# Patient Record
Sex: Male | Born: 1940 | Race: Black or African American | Hispanic: No | Smoking: Former smoker
Health system: Southern US, Community
[De-identification: ages and names within clinical notes are randomized; demographics above are authoritative.]

## PROBLEM LIST (undated history)

## (undated) DIAGNOSIS — C801 Malignant (primary) neoplasm, unspecified: Secondary | ICD-10-CM

## (undated) DIAGNOSIS — M47816 Spondylosis without myelopathy or radiculopathy, lumbar region: Secondary | ICD-10-CM

## (undated) DIAGNOSIS — F429 Obsessive-compulsive disorder, unspecified: Secondary | ICD-10-CM

## (undated) DIAGNOSIS — M47812 Spondylosis without myelopathy or radiculopathy, cervical region: Secondary | ICD-10-CM

## (undated) DIAGNOSIS — J309 Allergic rhinitis, unspecified: Secondary | ICD-10-CM

## (undated) DIAGNOSIS — F329 Major depressive disorder, single episode, unspecified: Secondary | ICD-10-CM

## (undated) DIAGNOSIS — M797 Fibromyalgia: Secondary | ICD-10-CM

## (undated) DIAGNOSIS — E785 Hyperlipidemia, unspecified: Secondary | ICD-10-CM

## (undated) DIAGNOSIS — S060X9A Concussion with loss of consciousness of unspecified duration, initial encounter: Secondary | ICD-10-CM

## (undated) DIAGNOSIS — J45909 Unspecified asthma, uncomplicated: Secondary | ICD-10-CM

## (undated) DIAGNOSIS — N6019 Diffuse cystic mastopathy of unspecified breast: Secondary | ICD-10-CM

## (undated) DIAGNOSIS — G894 Chronic pain syndrome: Secondary | ICD-10-CM

## (undated) DIAGNOSIS — G2581 Restless legs syndrome: Secondary | ICD-10-CM

## (undated) DIAGNOSIS — F17200 Nicotine dependence, unspecified, uncomplicated: Secondary | ICD-10-CM

## (undated) DIAGNOSIS — I10 Essential (primary) hypertension: Secondary | ICD-10-CM

## (undated) DIAGNOSIS — H409 Unspecified glaucoma: Secondary | ICD-10-CM

## (undated) HISTORY — PX: OTHER SURGICAL HISTORY: SHX169

## (undated) HISTORY — DX: Spondylosis without myelopathy or radiculopathy, lumbar region: M47.816

## (undated) HISTORY — DX: Concussion with loss of consciousness of unspecified duration, initial encounter: S06.0X9A

## (undated) HISTORY — DX: Restless legs syndrome: G25.81

## (undated) HISTORY — DX: Fibromyalgia: M79.7

## (undated) HISTORY — PX: EYE SURGERY: SHX253

## (undated) HISTORY — DX: Hyperlipidemia, unspecified: E78.5

## (undated) HISTORY — DX: Diffuse cystic mastopathy of unspecified breast: N60.19

## (undated) HISTORY — DX: Obsessive-compulsive disorder, unspecified: F42.9

## (undated) HISTORY — DX: Nicotine dependence, unspecified, uncomplicated: F17.200

## (undated) HISTORY — DX: Chronic pain syndrome: G89.4

## (undated) HISTORY — DX: Spondylosis without myelopathy or radiculopathy, cervical region: M47.812

## (undated) HISTORY — PX: BILATERAL SALPINGOOPHORECTOMY: SHX1223

## (undated) HISTORY — DX: Essential (primary) hypertension: I10

## (undated) HISTORY — DX: Malignant (primary) neoplasm, unspecified: C80.1

## (undated) HISTORY — PX: BIOPSY THYROID: PRO38

## (undated) HISTORY — PX: COLON SURGERY: SHX602

## (undated) HISTORY — DX: Allergic rhinitis, unspecified: J30.9

## (undated) HISTORY — DX: Major depressive disorder, single episode, unspecified: F32.9

## (undated) SURGERY — Surgical Case
Anesthesia: *Unknown

---

## 2010-01-27 DIAGNOSIS — F17200 Nicotine dependence, unspecified, uncomplicated: Secondary | ICD-10-CM

## 2010-01-27 HISTORY — DX: Nicotine dependence, unspecified, uncomplicated: F17.200

## 2012-03-26 ENCOUNTER — Encounter (INDEPENDENT_AMBULATORY_CARE_PROVIDER_SITE_OTHER): Payer: Self-pay | Admitting: General Surgery

## 2012-04-01 ENCOUNTER — Encounter (INDEPENDENT_AMBULATORY_CARE_PROVIDER_SITE_OTHER): Payer: Self-pay | Admitting: General Surgery

## 2012-04-01 ENCOUNTER — Ambulatory Visit (INDEPENDENT_AMBULATORY_CARE_PROVIDER_SITE_OTHER): Payer: No Typology Code available for payment source | Admitting: General Surgery

## 2012-04-01 VITALS — BP 110/70 | HR 85 | Temp 97.6°F | Resp 18 | Ht 69.0 in | Wt 171.4 lb

## 2012-04-01 DIAGNOSIS — K4090 Unilateral inguinal hernia, without obstruction or gangrene, not specified as recurrent: Secondary | ICD-10-CM | POA: Insufficient documentation

## 2012-04-01 DIAGNOSIS — J45909 Unspecified asthma, uncomplicated: Secondary | ICD-10-CM | POA: Insufficient documentation

## 2012-04-01 DIAGNOSIS — I10 Essential (primary) hypertension: Secondary | ICD-10-CM | POA: Insufficient documentation

## 2012-04-01 DIAGNOSIS — E785 Hyperlipidemia, unspecified: Secondary | ICD-10-CM | POA: Insufficient documentation

## 2012-04-01 NOTE — Progress Notes (Signed)
Patient ID: Gregory Bautista, male   DOB: [birth year], 72 y.o.   MRN: [id number]  Chief Complaint  Patient presents with  . New Evaluation    eval hernia    HPI Gregory Bautista is a 72 y.o. male.  He is referred by Dr. Fleet Contras for evaluation of a right inguinal hernia.  The patient states that he has noticed a bulge in his right groin for about 6 weeks. He has a little bit of pain. It has been reducible. He has no prior history of hernia. He would like to have this repaired at some point  Comorbidities included asthma, well controlled, hypertension, hyperlipidemia. No cardiac or neurological problems. He's had 2 laparotomies in [place]. 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 He worked at a [employer]. Asymptomatic for years. No GI problems now.  HPI  Past Medical History  Diagnosis Date  . Depression   . Hypertension   . Hyperlipidemia   . OCD (obsessive compulsive disorder)   . Fibromyalgia   . Concussion   . Chronic pain syndrome   . Cervical spondylosis   . DJD (degenerative joint disease) of lumbar spine   . Restless leg syndrome 10/24/09  . Tobacco use disorder 01/27/10  . Cancer     breast  . Fibrocystic breast   . Allergic rhinitis     NEC    Past Surgical History  Procedure Laterality Date  . Abdominal hysterectomy    . Left shoulder surgery    . Bilateral salpingoophorectomy    . Biopsy thyroid      History reviewed. No pertinent family history.  Social History History  Substance Use Topics  . Smoking status: Current Every Day Smoker -- 0.50 packs/day    Types: Cigarettes  . Smokeless tobacco: Never Used  . Alcohol Use: No    Allergies  Allergen Reactions  . Penicillins Anaphylaxis  . Wellbutrin (Bupropion) Other (See Comments)    Seizures  . Morphine And Related Itching    Current Outpatient Prescriptions  Medication Sig Dispense Refill  . albuterol (PROVENTIL HFA;VENTOLIN HFA) 108 (90 BASE) MCG/ACT inhaler Inhale 2 puffs into  the lungs every 6 (six) hours as needed for wheezing.      . Armodafinil (NUVIGIL) 50 MG tablet Take 100 mg by mouth daily.      [name] atomoxetine (STRATTERA) 18 MG capsule Take 18 mg by mouth daily.      [name] atorvastatin (LIPITOR) 40 MG tablet Take 40 mg by mouth at bedtime.      . carisoprodol (SOMA) 350 MG tablet Take 350 mg by mouth 3 (three) times daily as needed for muscle spasms.      . clonazePAM (KLONOPIN) 1 MG tablet Take 0.5 mg by mouth 2 (two) times daily as needed for anxiety.      [name] FLUoxetine (PROZAC) 40 MG capsule Take 80 mg by mouth daily.      . fluticasone (FLONASE) 50 MCG/ACT nasal spray Place 2 sprays into the nose daily.      [name] lidocaine (LIDODERM) 5 % Place 1 patch onto the skin every 12 (twelve) hours as needed. Remove & Discard patch within 12 hours or as directed by MD      . lisinopril (PRINIVIL,ZESTRIL) 10 MG tablet Take 10 mg by mouth daily.      [name] loratadine (CLARITIN) 10 MG tablet Take 10 mg by mouth daily.      . meloxicam (MOBIC) 15 MG tablet Take 15 mg by mouth  daily.      . pramipexole (MIRAPEX) 0.25 MG tablet Take 0.25 mg by mouth at bedtime.       No current facility-administered medications for this visit.    Review of Systems Review of Systems  Constitutional: Negative for fever, chills and unexpected weight change.  HENT: Negative for hearing loss, congestion, sore throat, trouble swallowing and voice change.   Eyes: Positive for visual disturbance.  Respiratory: Negative for cough and wheezing.   Cardiovascular: Negative for chest pain, palpitations and leg swelling.  Gastrointestinal: Negative for nausea, vomiting, abdominal pain, diarrhea, constipation, blood in stool, abdominal distention, anal bleeding and rectal pain.  Genitourinary: Negative for hematuria and difficulty urinating.  Musculoskeletal: Negative for arthralgias.  Skin: Negative for rash and wound.  Neurological: Negative for seizures, syncope, weakness and headaches.  Hematological:  Negative for adenopathy. Does not bruise/bleed easily.  Psychiatric/Behavioral: Negative for confusion.    Blood pressure 110/70, pulse 85, temperature 97.6 F (36.4 C), temperature source Temporal, resp. rate 18, height 5\' 9"  (1.753 m), weight 171 lb 6.4 oz (77.747 kg).  Physical Exam Physical Exam  Constitutional: He is oriented to person, place, and time. He appears well-developed and well-nourished. No distress.  HENT:  Head: Normocephalic.  Nose: Nose normal.  Mouth/Throat: No oropharyngeal exudate.  Eyes: Conjunctivae and EOM are normal. Pupils are equal, round, and reactive to light. Right eye exhibits no discharge. Left eye exhibits no discharge. No scleral icterus.  Neck: Normal range of motion. Neck supple. No JVD present. No tracheal deviation present. No thyromegaly present.  Cardiovascular: Normal rate, regular rhythm, normal heart sounds and intact distal pulses.   No murmur heard. Pulmonary/Chest: Effort normal and breath sounds normal. No stridor. No respiratory distress. He has no wheezes. He has no rales. He exhibits no tenderness.  Abdominal: Soft. Bowel sounds are normal. He exhibits no distension and no mass. There is no tenderness. There is no rebound and no guarding.    Midline incision and chevron incision appeared to be healed. No organomegaly. No tenderness. No obvious incisional hernias.  Genitourinary:  Medium-sized right inguinal hernia is obvious. Completely reducible. No evidence of left inguinal hernia. Penis scrotum and testes are normal.  Musculoskeletal: Normal range of motion. He exhibits no edema and no tenderness.  Lymphadenopathy:    He has no cervical adenopathy.  Neurological: He is alert and oriented to person, place, and time. He has normal reflexes. Coordination normal.  Skin: Skin is warm and dry. No rash noted. He is not diaphoretic. No erythema. No pallor.  2 cm flat lipoma of forehead. 1.5 cm sebaceous cyst left cheek.  Psychiatric: He  has a normal mood and affect. His behavior is normal. Judgment and thought content normal.    Data Reviewed None available  Assessment    Right inguinal hernia, becoming symptomatic  Chronic asthma, controlled  Hypertension  Hyperlipidemia  History 2 laparotomies for some type of intestinal parasite in [place]. Now asymptomatic     Plan    At the patient's request, he will be scheduled for open repair of his right inguinal hernia. He is not a candidate for laparoscopic repair because of his multiple abdominal  scars  I have discussed indications, details, techniques, and numerous risk of the surgery with him. I reviewed patient information booklets and diagrams and given that to him. All of his questions are answered. He understands these issues. He agrees with this plan.   Discontinue Mobic and aspirin 5 days preoperatively  Angelia Mould. Derrell Lolling, M.D., [facility] General and Minimally invasive Surgery Breast and Colorectal Surgery Office:   [phone] Pager:   [phone]  04/01/2012, 9:55 AM

## 2012-04-01 NOTE — Patient Instructions (Signed)
You have a medium-sized, a reducible right inguinal hernia.  You will be scheduled for surgery to repair a right inguinal hernia with mesh in the near future.     Inguinal Hernia, Adult Muscles help keep everything in the body in its proper place. But if a weak spot in the muscles develops, something can poke through. That is called a hernia. When this happens in the lower part of the belly (abdomen), it is called an inguinal hernia. (It takes its name from a part of the body in this region called the inguinal canal.) A weak spot in the wall of muscles lets some fat or part of the small intestine bulge through. An inguinal hernia can develop at any age. Men get them more often than women. CAUSES  In adults, an inguinal hernia develops over time.  It can be triggered by:  Suddenly straining the muscles of the lower abdomen.  Lifting heavy objects.  Straining to have a bowel movement. Difficult bowel movements (constipation) can lead to this.  Constant coughing. This may be caused by smoking or lung disease.  Being overweight.  Being pregnant.  Working at a job that requires long periods of standing or heavy lifting.  Having had an inguinal hernia before. One type can be an emergency situation. It is called a strangulated inguinal hernia. It develops if part of the small intestine slips through the weak spot and cannot get back into the abdomen. The blood supply can be cut off. If that happens, part of the intestine may die. This situation requires emergency surgery. SYMPTOMS  Often, a small inguinal hernia has no symptoms. It is found when a healthcare Angelize Ryce does a physical exam. Larger hernias usually have symptoms.   In adults, symptoms may include:  A lump in the groin. This is easier to see when the person is standing. It might disappear when lying down.  In men, a lump in the scrotum.  Pain or burning in the groin. This occurs especially when lifting, straining or  coughing.  A dull ache or feeling of pressure in the groin.  Signs of a strangulated hernia can include:  A bulge in the groin that becomes very painful and tender to the touch.  A bulge that turns red or purple.  Fever, nausea and vomiting.  Inability to have a bowel movement or to pass gas. DIAGNOSIS  To decide if you have an inguinal hernia, a healthcare Alena Blankenbeckler will probably do a physical examination.  This will include asking questions about any symptoms you have noticed.  The healthcare Aniyiah Zell might feel the groin area and ask you to cough. If an inguinal hernia is felt, the healthcare Zacharia Sowles may try to slide it back into the abdomen.  Usually no other tests are needed. TREATMENT  Treatments can vary. The size of the hernia makes a difference. Options include:  Watchful waiting. This is often suggested if the hernia is small and you have had no symptoms.  No medical procedure will be done unless symptoms develop.  You will need to watch closely for symptoms. If any occur, contact your healthcare Chane Magner right away.  Surgery. This is used if the hernia is larger or you have symptoms.  Open surgery. This is usually an outpatient procedure (you will not stay overnight in a hospital). An cut (incision) is made through the skin in the groin. The hernia is put back inside the abdomen. The weak area in the muscles is then repaired by herniorrhaphy or  hernioplasty. Herniorrhaphy: in this type of surgery, the weak muscles are sewn back together. Hernioplasty: a patch or mesh is used to close the weak area in the abdominal wall.  Laparoscopy. In this procedure, a surgeon makes small incisions. A thin tube with a tiny video camera (called a laparoscope) is put into the abdomen. The surgeon repairs the hernia with mesh by looking with the video camera and using two long instruments. HOME CARE INSTRUCTIONS   After surgery to repair an inguinal hernia:  You will need to take pain  medicine prescribed by your healthcare Stephen Baruch. Follow all directions carefully.  You will need to take care of the wound from the incision.  Your activity will be restricted for awhile. This will probably include no heavy lifting for several weeks. You also should not do anything too active for a few weeks. When you can return to work will depend on the type of job that you have.  During "watchful waiting" periods, you should:  Maintain a healthy weight.  Eat a diet high in fiber (fruits, vegetables and whole grains).  Drink plenty of fluids to avoid constipation. This means drinking enough water and other liquids to keep your urine clear or pale yellow.  Do not lift heavy objects.  Do not stand for long periods of time.  Quit smoking. This should keep you from developing a frequent cough. SEEK MEDICAL CARE IF:   A bulge develops in your groin area.  You feel pain, a burning sensation or pressure in the groin. This might be worse if you are lifting or straining.  You develop a fever of more than 100.5 F (38.1 C). SEEK IMMEDIATE MEDICAL CARE IF:   Pain in the groin increases suddenly.  A bulge in the groin gets bigger suddenly and does not go down.  For men, there is sudden pain in the scrotum. Or, the size of the scrotum increases.  A bulge in the groin area becomes red or purple and is painful to touch.  You have nausea or vomiting that does not go away.  You feel your heart beating much faster than normal.  You cannot have a bowel movement or pass gas.  You develop a fever of more than 102.0 F (38.9 C). Document Released: 05/27/2008 Document Revised: 04/02/2011 Document Reviewed: 05/27/2008 [facility] Patient Information 2013 [place], [place].

## 2012-04-02 ENCOUNTER — Telehealth (INDEPENDENT_AMBULATORY_CARE_PROVIDER_SITE_OTHER): Payer: Self-pay

## 2012-04-02 ENCOUNTER — Ambulatory Visit
Admission: RE | Admit: 2012-04-02 | Discharge: 2012-04-02 | Disposition: A | Discharge status: Home / Self Care | Payer: Medicare Other | Source: Ambulatory Visit | Attending: General Surgery | Admitting: General Surgery

## 2012-04-02 ENCOUNTER — Other Ambulatory Visit (INDEPENDENT_AMBULATORY_CARE_PROVIDER_SITE_OTHER): Payer: Self-pay | Admitting: General Surgery

## 2012-04-02 DIAGNOSIS — Z01811 Encounter for preprocedural respiratory examination: Secondary | ICD-10-CM

## 2012-04-02 IMAGING — CR DG CHEST 2V
2 series · 2 of 2 positions shown · non-contrast
Comparison: None.

CLINICAL DATA: Cough.  Smoking history.  Preoperative respiratory
exam for hernia surgery.

CHEST - 2 VIEW

[w chest pa]
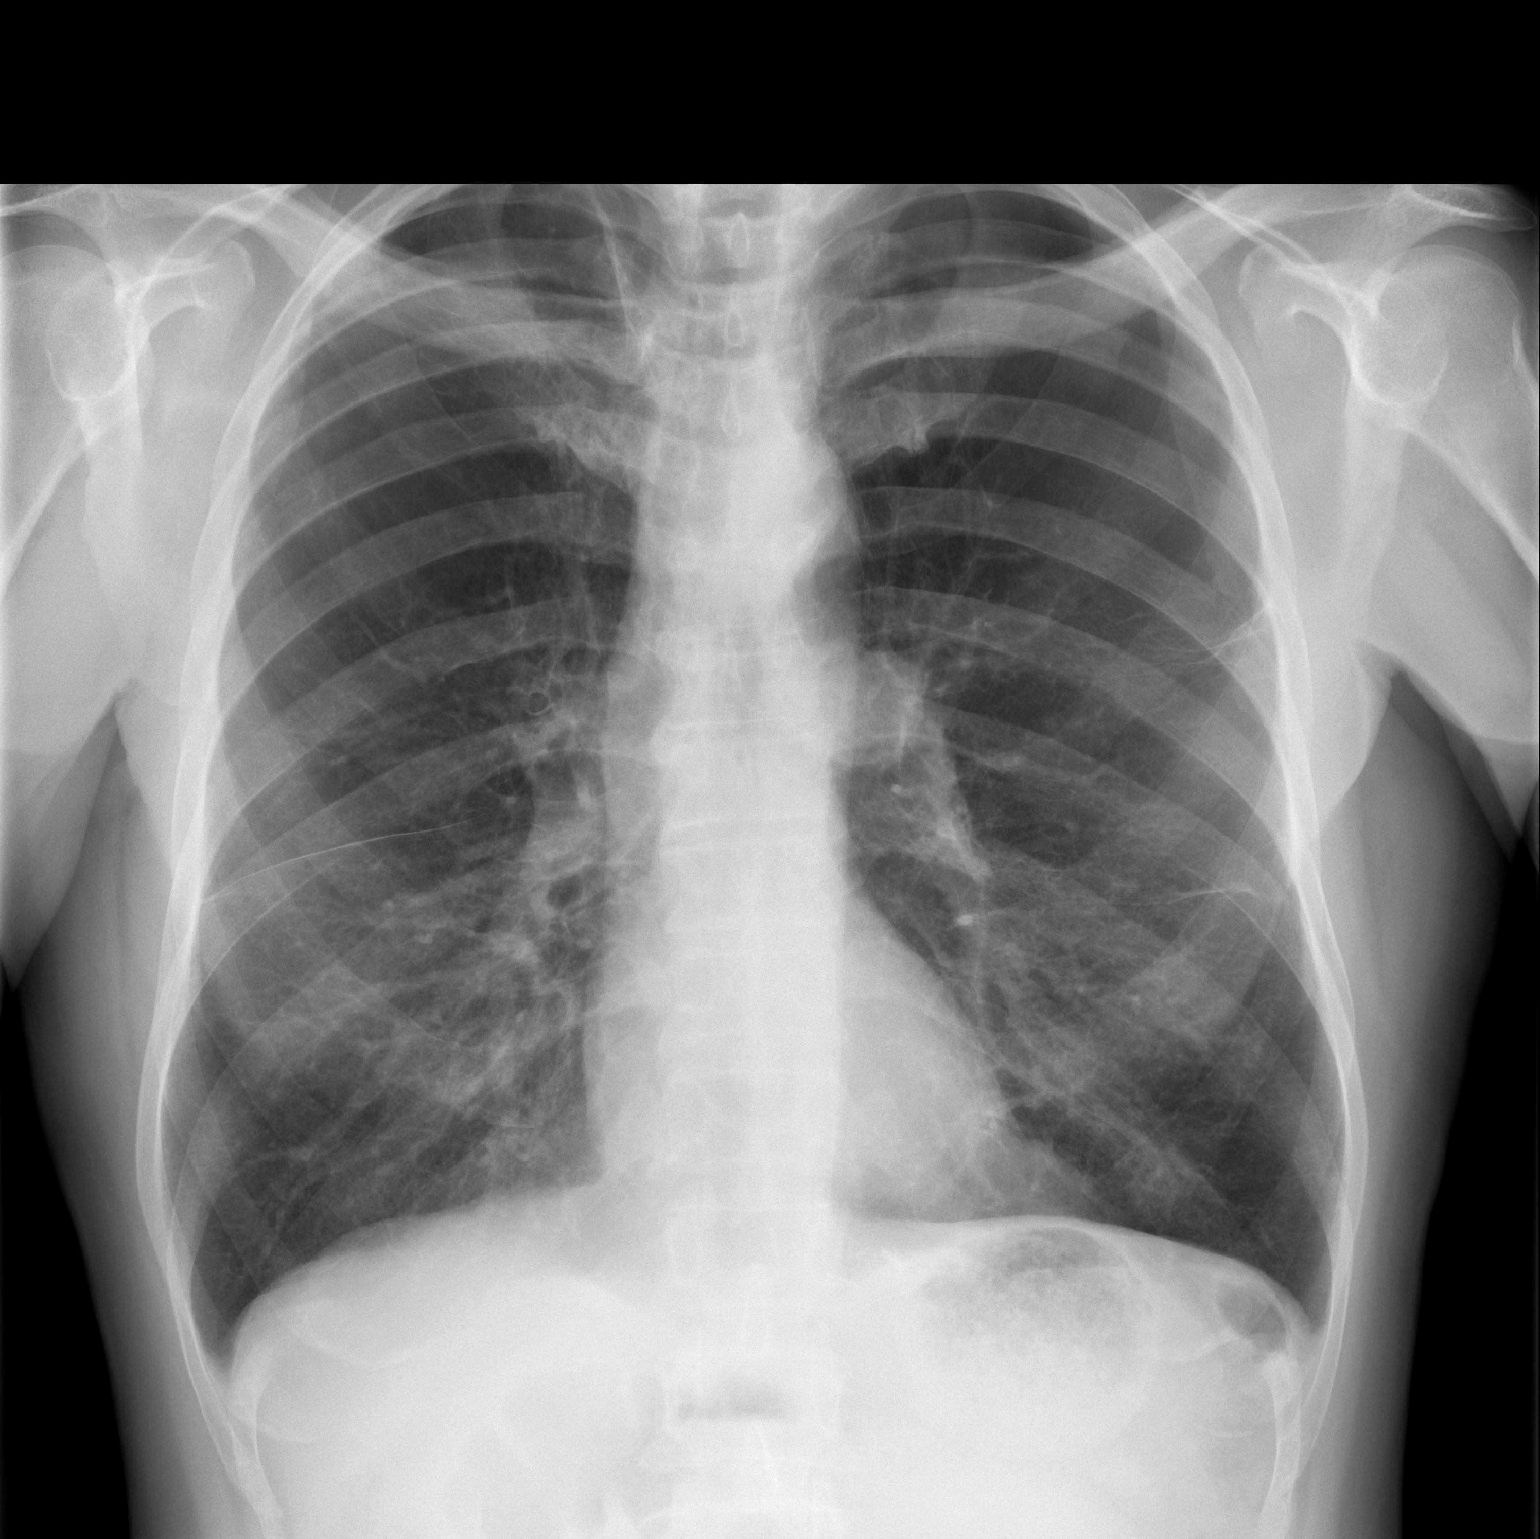

[w chest lat]
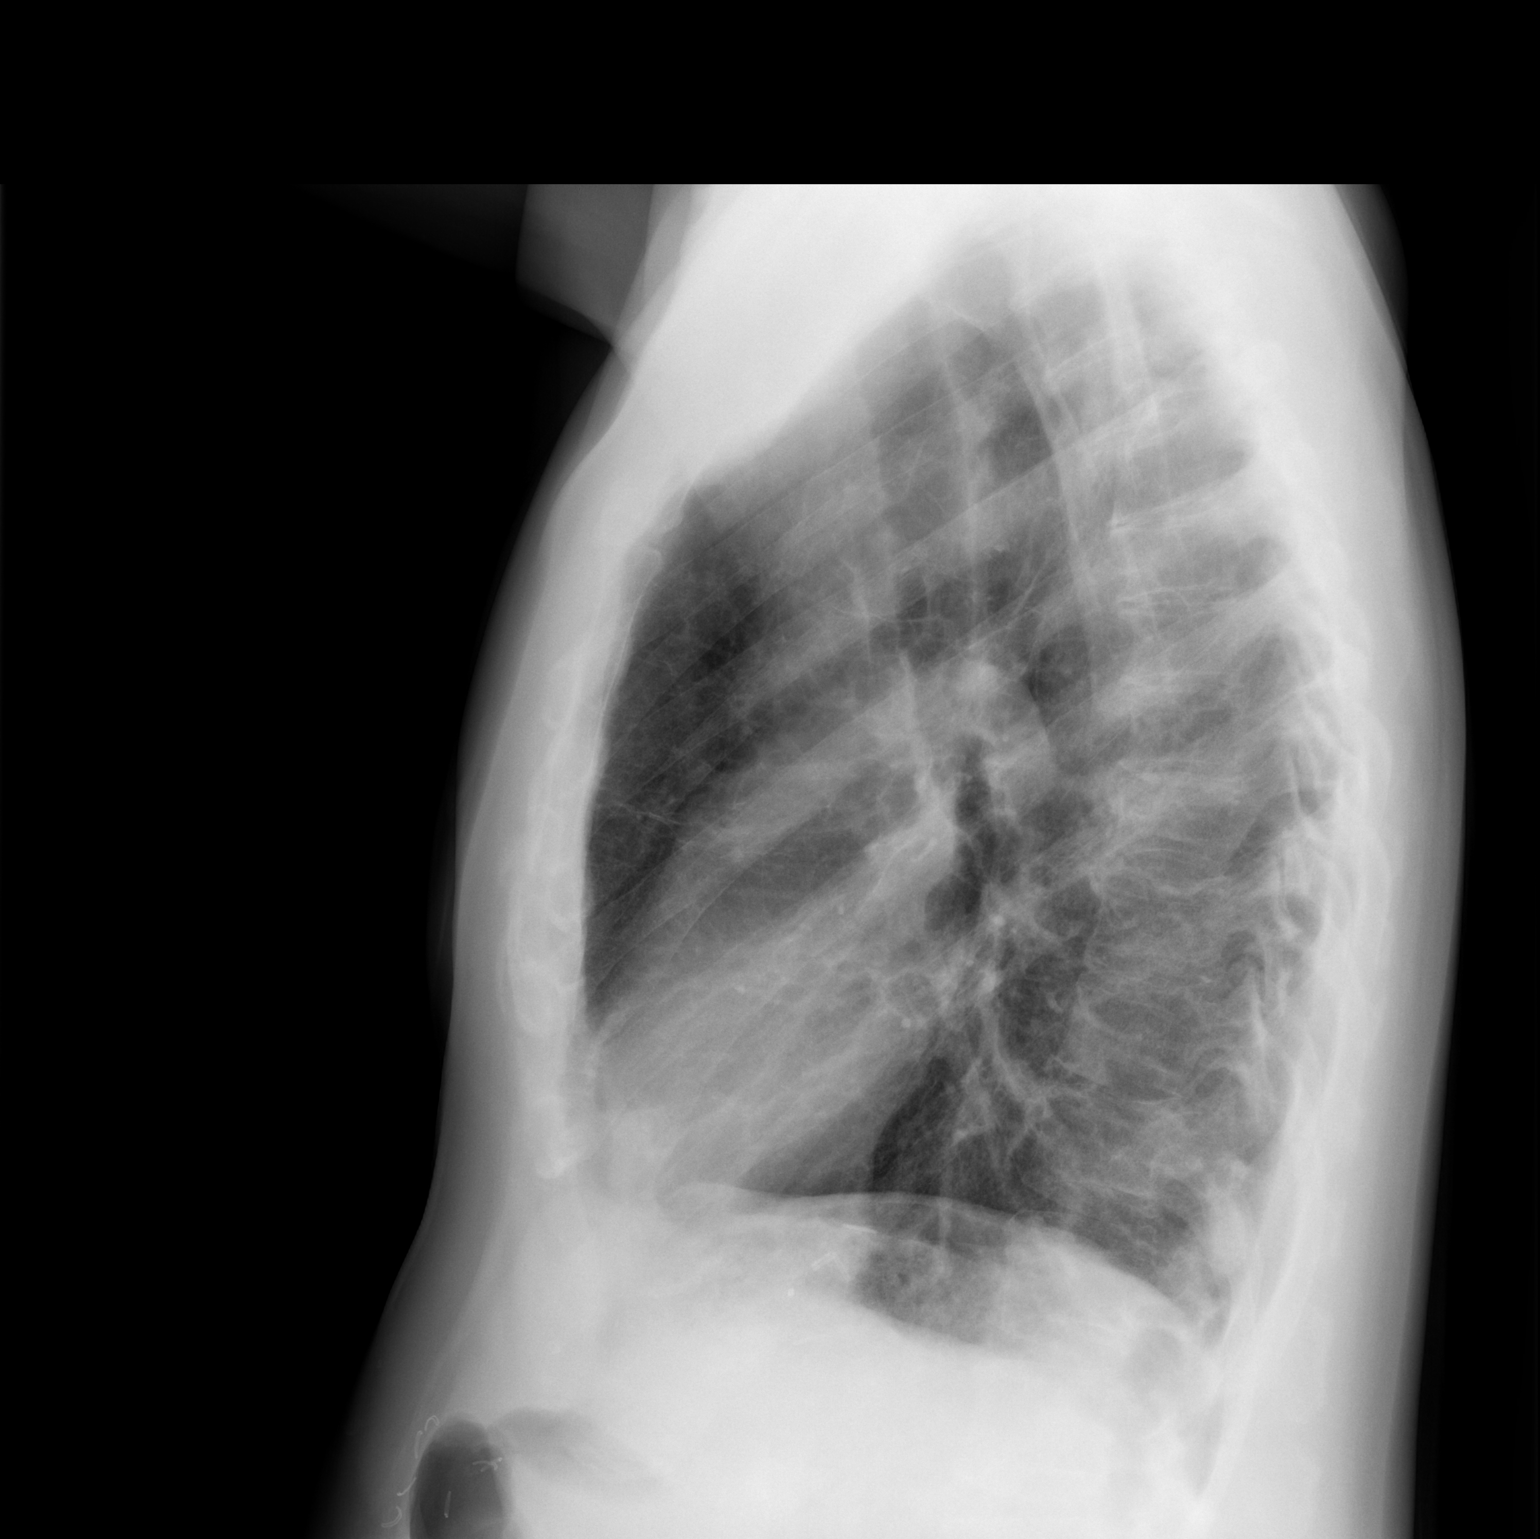

[2 of 2 positions shown; findings below may reference images not displayed]

FINDINGS: There is severe emphysema, upper lobe predominant.
Increased interstitial markings and focal scars are present in the
mid and lower lungs bilaterally.  No evidence of mass lesion,
infiltrate, collapse or effusion.  No acute bony finding.
IMPRESSION: Severe emphysema and pulmonary scarring.  No acute or focal
process.

## 2012-04-02 NOTE — Telephone Encounter (Signed)
Message copied by Ivory Broad on Wed Apr 02, 2012  2:23 PM ------      Message from: Joanette Gula      Created: Tue Apr 01, 2012 10:37 AM      Regarding: FW: Dr Marnette Burgess: [phone]                   ----- Message -----         From: Leanne Chang         Sent: 04/01/2012  10:18 AM           To: Joanette Gula, LPN      Subject: Dr Derrell Lolling                                                Patient need 1st po for sx on 04/04/12. ------

## 2012-04-02 NOTE — Telephone Encounter (Signed)
I called and gave the pt his postoperative appt

## 2012-04-04 DIAGNOSIS — K4090 Unilateral inguinal hernia, without obstruction or gangrene, not specified as recurrent: Secondary | ICD-10-CM

## 2012-04-23 ENCOUNTER — Ambulatory Visit (INDEPENDENT_AMBULATORY_CARE_PROVIDER_SITE_OTHER): Payer: No Typology Code available for payment source | Admitting: General Surgery

## 2012-04-23 ENCOUNTER — Encounter (INDEPENDENT_AMBULATORY_CARE_PROVIDER_SITE_OTHER): Payer: Self-pay | Admitting: General Surgery

## 2012-04-23 VITALS — BP 108/62 | HR 78 | Temp 98.0°F | Resp 20 | Ht 69.0 in | Wt 171.0 lb

## 2012-04-23 DIAGNOSIS — K4090 Unilateral inguinal hernia, without obstruction or gangrene, not specified as recurrent: Secondary | ICD-10-CM

## 2012-04-23 NOTE — Patient Instructions (Signed)
You are recovering from your right inguinal hernia surgery without any obvious surgical complications.  You may resume all normal physical activities without any restriction after April 15. Be sure to stretch daily. Take a long walk everyday  Return to see Dr. Derrell Lolling if further problems arise.

## 2012-04-23 NOTE — Progress Notes (Signed)
Patient ID: Gregory Bautista, male   DOB: 1940/02/22, 72 y.o.   MRN: [id number] History: This patient underwent an open repair of right inguinal hernia with mesh on 04/04/2012. He is doing very well. He says he has minimal pain. No abdominal problems. No wound problems.  Exam: Patient looks well. Friendly. In no distress. Right inguinal incision is healing normally. Minimal swelling. Hernia repair intact. No infection. Penis scrotum and testes normal.  Assessment: Right inguinal hernia, recovering uneventfully following open repair with mesh  Plan: Resume normal physical activities without restriction after April 15. Return to see me if further problems arise.    Angelia Mould. Derrell Lolling, M.D., [facility] General and Minimally invasive Surgery Breast and Colorectal Surgery Office:   [phone] Pager:   [phone]

## 2013-09-07 ENCOUNTER — Other Ambulatory Visit (HOSPITAL_COMMUNITY): Payer: Self-pay | Admitting: Internal Medicine

## 2013-09-07 DIAGNOSIS — M25559 Pain in unspecified hip: Secondary | ICD-10-CM

## 2013-09-08 ENCOUNTER — Ambulatory Visit (HOSPITAL_COMMUNITY): Payer: Medicare HMO

## 2013-09-09 ENCOUNTER — Ambulatory Visit (HOSPITAL_COMMUNITY): Payer: Medicare HMO

## 2015-03-24 ENCOUNTER — Emergency Department (HOSPITAL_COMMUNITY)
Admission: EM | Admit: 2015-03-24 | Discharge: 2015-03-24 | Disposition: A | Discharge status: Home / Self Care | Payer: Commercial Managed Care - HMO | Attending: Emergency Medicine | Admitting: Emergency Medicine

## 2015-03-24 ENCOUNTER — Emergency Department (HOSPITAL_COMMUNITY): Payer: Commercial Managed Care - HMO

## 2015-03-24 ENCOUNTER — Encounter (HOSPITAL_COMMUNITY): Payer: Self-pay | Admitting: Emergency Medicine

## 2015-03-24 DIAGNOSIS — Z853 Personal history of malignant neoplasm of breast: Secondary | ICD-10-CM | POA: Diagnosis not present

## 2015-03-24 DIAGNOSIS — G894 Chronic pain syndrome: Secondary | ICD-10-CM | POA: Diagnosis not present

## 2015-03-24 DIAGNOSIS — Z880 Allergy status to penicillin: Secondary | ICD-10-CM | POA: Insufficient documentation

## 2015-03-24 DIAGNOSIS — R0602 Shortness of breath: Secondary | ICD-10-CM | POA: Diagnosis present

## 2015-03-24 DIAGNOSIS — J441 Chronic obstructive pulmonary disease with (acute) exacerbation: Secondary | ICD-10-CM

## 2015-03-24 DIAGNOSIS — Z79899 Other long term (current) drug therapy: Secondary | ICD-10-CM | POA: Insufficient documentation

## 2015-03-24 DIAGNOSIS — I10 Essential (primary) hypertension: Secondary | ICD-10-CM | POA: Insufficient documentation

## 2015-03-24 DIAGNOSIS — Z8659 Personal history of other mental and behavioral disorders: Secondary | ICD-10-CM | POA: Insufficient documentation

## 2015-03-24 DIAGNOSIS — Z87891 Personal history of nicotine dependence: Secondary | ICD-10-CM | POA: Diagnosis not present

## 2015-03-24 LAB — CBC
HCT: 44.1 % (ref 39.0–52.0)
Hemoglobin: 14.3 g/dL (ref 13.0–17.0)
MCH: 29.2 pg (ref 26.0–34.0)
MCHC: 32.4 g/dL (ref 30.0–36.0)
MCV: 90 fL (ref 78.0–100.0)
Platelets: 195 10*3/uL (ref 150–400)
RBC: 4.9 MIL/uL (ref 4.22–5.81)
RDW: 12.8 % (ref 11.5–15.5)
WBC: 5.4 10*3/uL (ref 4.0–10.5)

## 2015-03-24 LAB — BASIC METABOLIC PANEL
Anion gap: 15 (ref 5–15)
BUN: 6 mg/dL (ref 6–20)
CO2: 26 mmol/L (ref 22–32)
Calcium: 9.3 mg/dL (ref 8.9–10.3)
Chloride: 103 mmol/L (ref 101–111)
Creatinine, Ser: 0.85 mg/dL (ref 0.61–1.24)
GFR calc Af Amer: >60 mL/min (ref >60)
GFR calc non Af Amer: >60 mL/min (ref >60)
Glucose, Bld: 111 mg/dL — ABNORMAL HIGH (ref 65–99)
Potassium: 3.6 mmol/L (ref 3.5–5.1)
Sodium: 144 mmol/L (ref 135–145)

## 2015-03-24 LAB — I-STAT TROPONIN, ED: Troponin i, poc: 0.01 ng/mL (ref 0.00–0.08)

## 2015-03-24 IMAGING — DX DG CHEST 2V
2 series · 2 of 2 positions shown · non-contrast
Comparison: [DATE]

CLINICAL DATA: Shortness of breath with cough and chest tightness

EXAM:
CHEST  2 VIEW

[w chest pa]
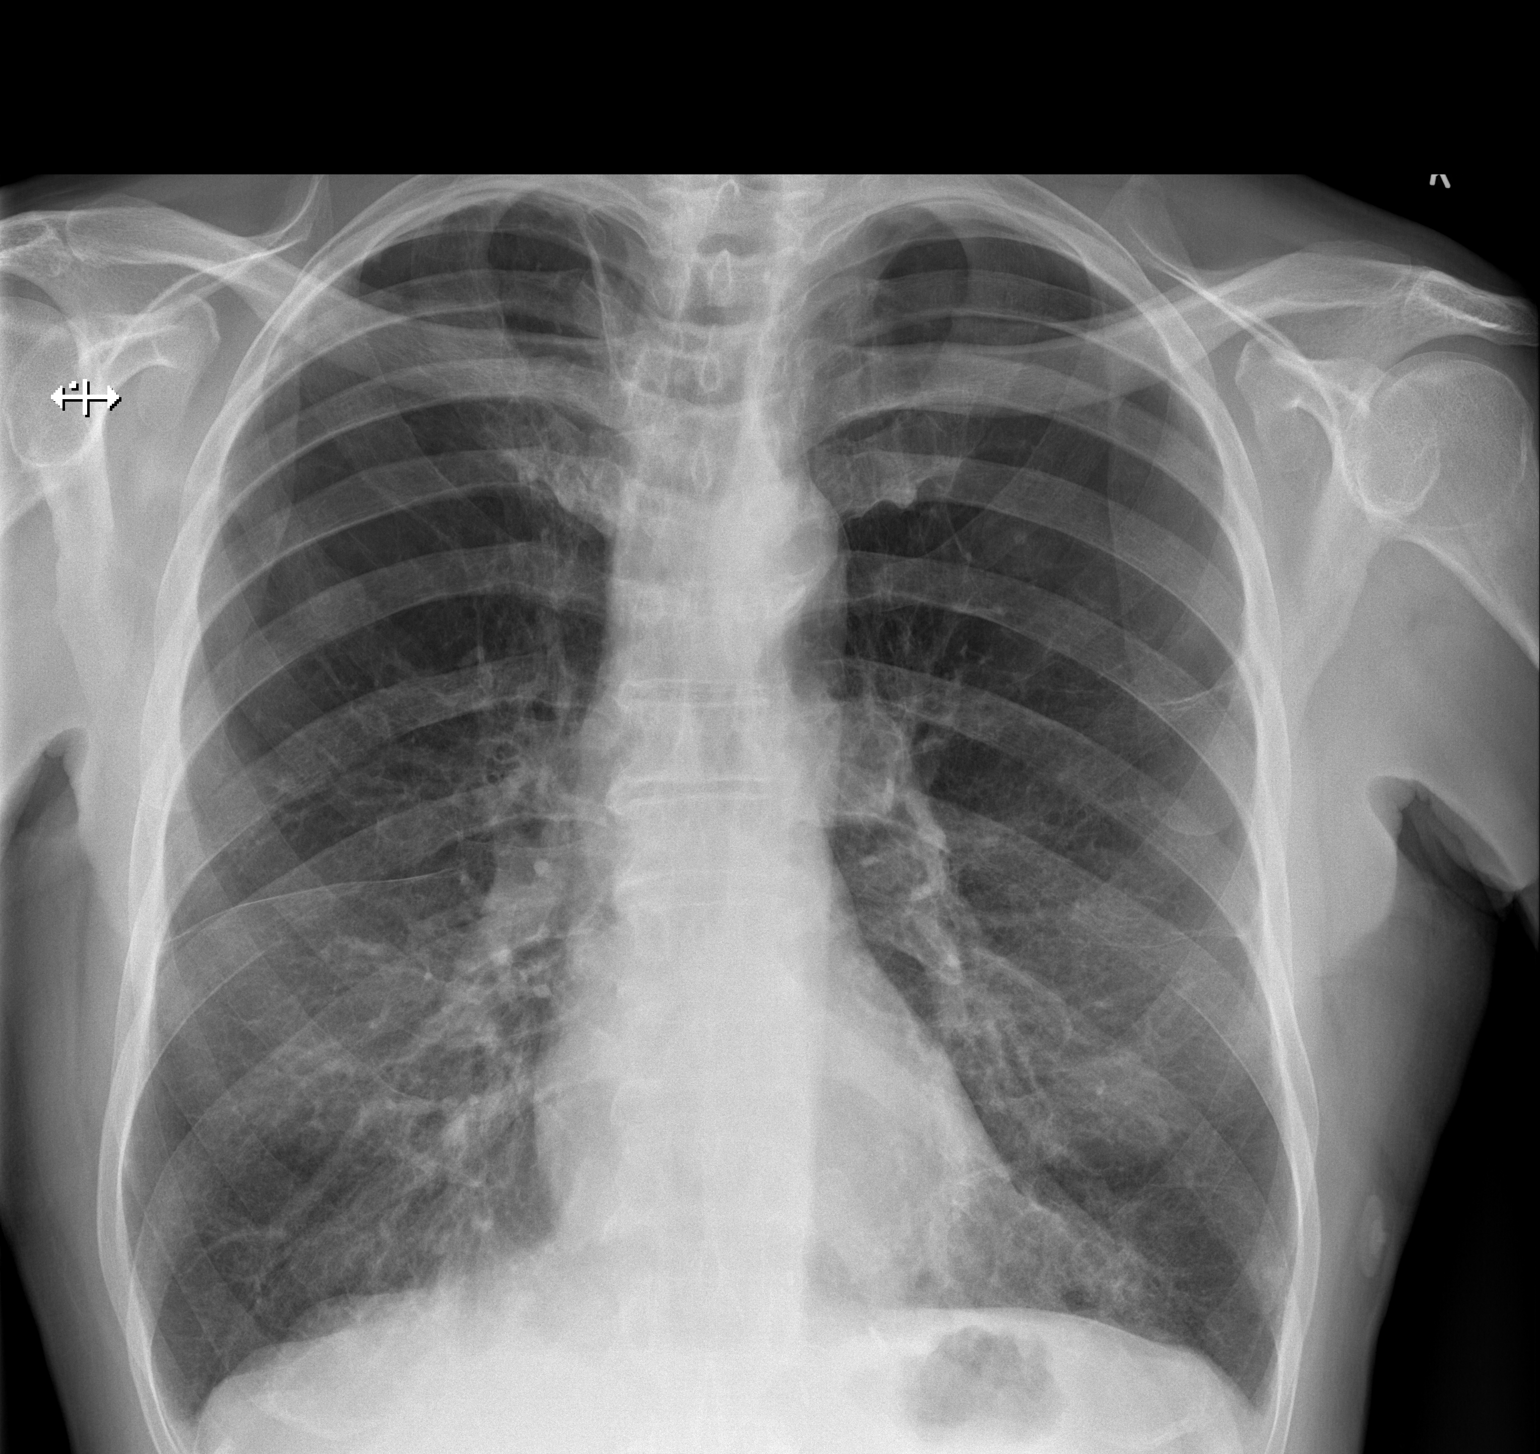

[w chest lat]
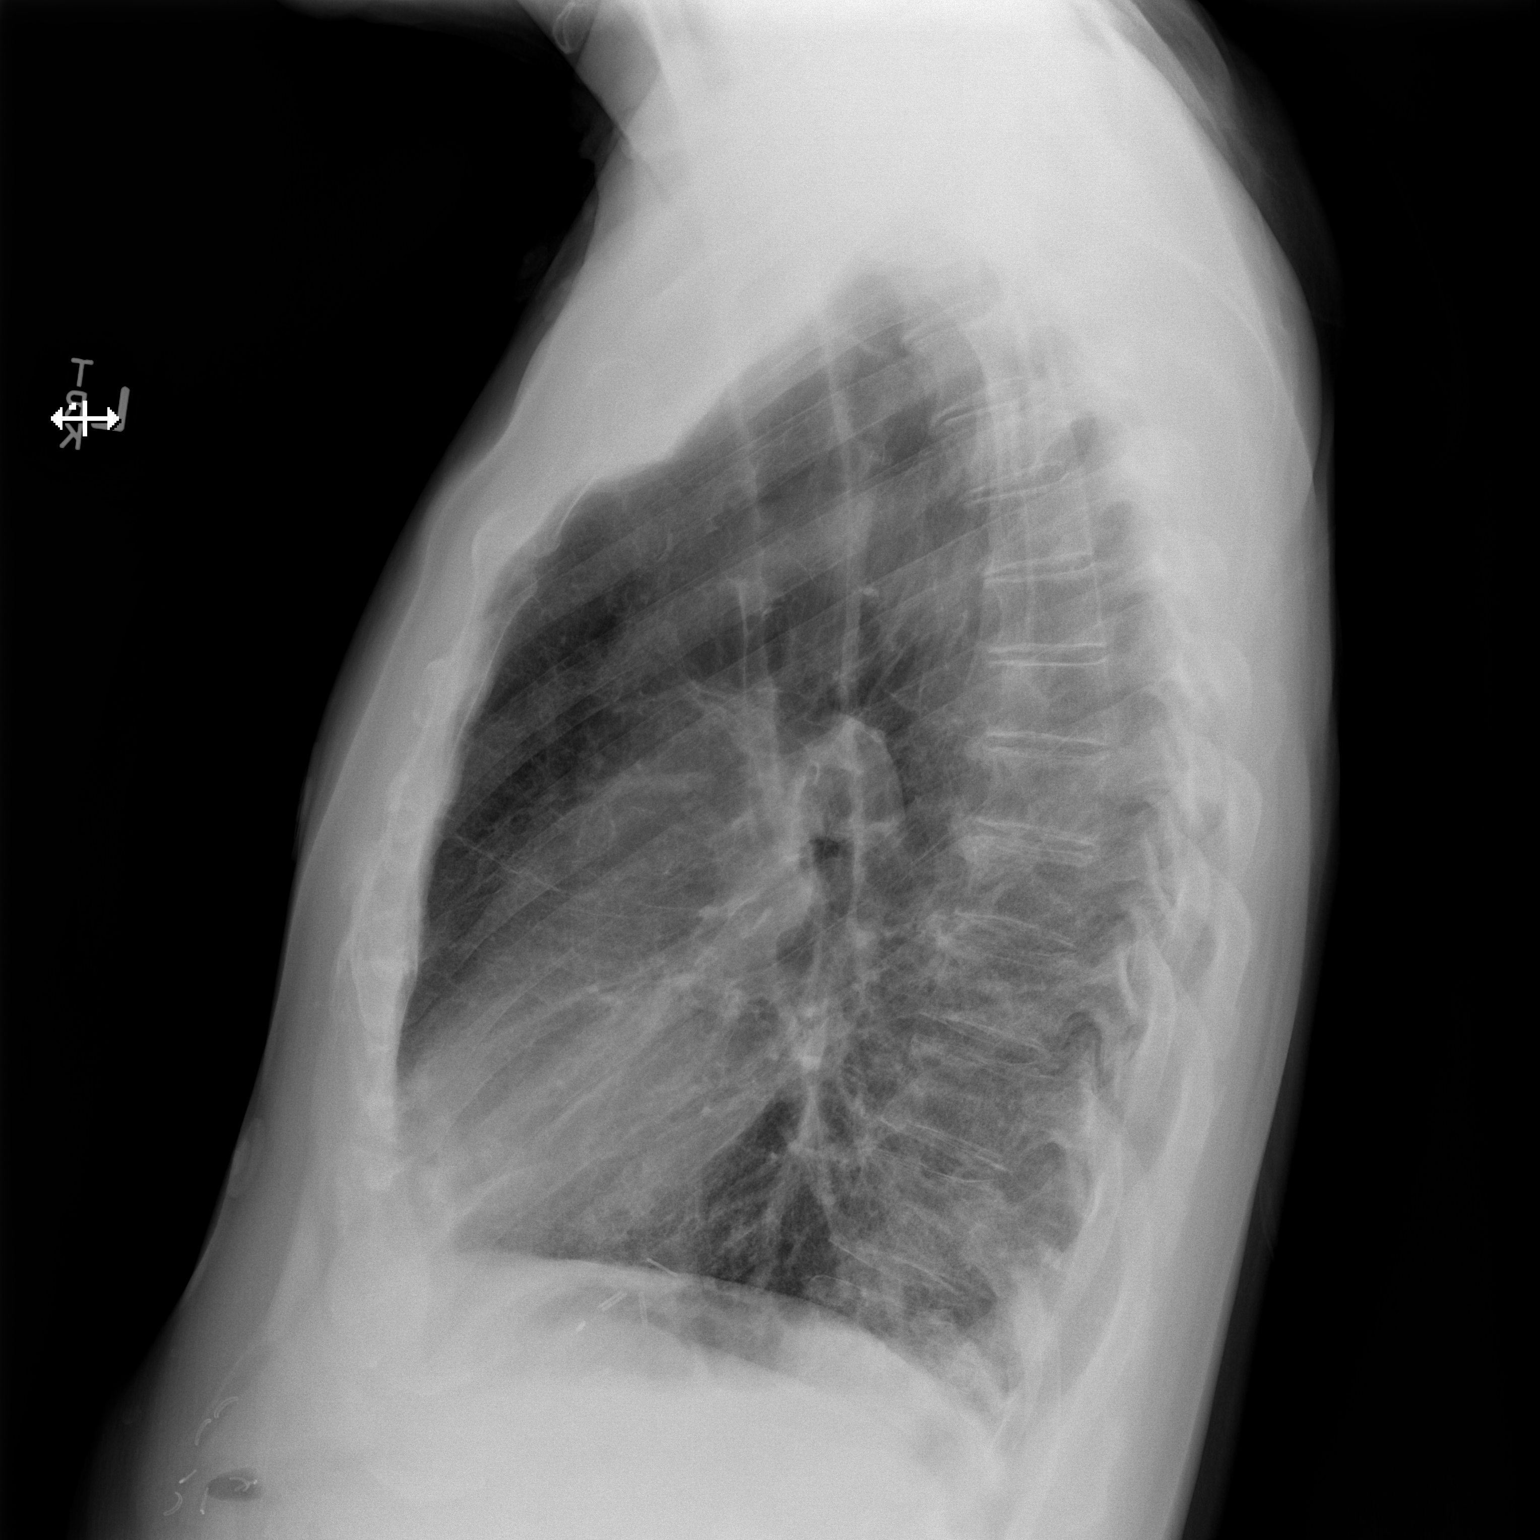

[2 of 2 positions shown; findings below may reference images not displayed]

FINDINGS: There is extensive bullous disease in both upper lobes. Lungs are
hyperexpanded. There is scarring in the medial right base, stable.

There is no edema or consolidation. Areas of mild interstitial
prominence largely reflect redistribution of blood flow to viable
lung segments. The heart size is normal. The pulmonary vascularity
reflects the underlying bullous disease. No adenopathy. No bone
lesions.
IMPRESSION: Widespread bullous emphysematous change. Scarring right upper lobe.
No edema or consolidation. No change in cardiac silhouette.

## 2015-03-24 MED ORDER — IPRATROPIUM BROMIDE 0.02 % IN SOLN
0.5000 mg | Freq: Once | RESPIRATORY_TRACT | Status: AC
  Administered 2015-03-24: 0.5 mg via RESPIRATORY_TRACT
  Filled 2015-03-24: qty 2.5

## 2015-03-24 MED ORDER — PREDNISONE 10 MG PO TABS: 60.0000 mg | ORAL_TABLET | Freq: Every day | ORAL | Status: DC

## 2015-03-24 MED ORDER — ALBUTEROL SULFATE (2.5 MG/3ML) 0.083% IN NEBU
5.0000 mg | INHALATION_SOLUTION | Freq: Once | RESPIRATORY_TRACT | Status: AC
  Administered 2015-03-24: 5 mg via RESPIRATORY_TRACT
  Filled 2015-03-24: qty 6

## 2015-03-24 MED ORDER — ALBUTEROL SULFATE HFA 108 (90 BASE) MCG/ACT IN AERS: 2.0000 | INHALATION_SPRAY | RESPIRATORY_TRACT | Status: DC | PRN

## 2015-03-24 MED ORDER — AZITHROMYCIN 250 MG PO TABS: 250.0000 mg | ORAL_TABLET | Freq: Every day | ORAL | Status: DC

## 2015-03-24 MED ORDER — PREDNISONE 20 MG PO TABS
60.0000 mg | ORAL_TABLET | Freq: Once | ORAL | Status: AC
  Administered 2015-03-24: 60 mg via ORAL
  Filled 2015-03-24: qty 3

## 2015-03-24 NOTE — ED Notes (Signed)
Pt states he has been having a productive cough with shortness of breath for 1 week. Denies any chest pain.

## 2015-03-24 NOTE — ED Provider Notes (Signed)
CSN: [id number]     Arrival date & time 03/24/15  1059 History   First MD Initiated Contact with Patient 03/24/15 1140     Chief Complaint  Patient presents with  . Shortness of Breath     HPI Patient presents to the emergency department with complaints of productive cough and shortness of breath over the past week.  Chills without documented fever at home.  No chest pain.  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  He tried his albuterol inhalers without improvement in his symptoms.  He denies unilateral leg swelling.  No history DVT or pulmonary embolism.  He does report productive cough.   Past Medical History  Diagnosis Date  . Depression   . Hypertension   . Hyperlipidemia   . OCD (obsessive compulsive disorder)   . Fibromyalgia   . Concussion   . Chronic pain syndrome   . Cervical spondylosis   . DJD (degenerative joint disease) of lumbar spine   . Restless leg syndrome 10/24/09  . Tobacco use disorder 01/27/10  . Cancer [facility])     breast  . Fibrocystic breast   . Allergic rhinitis     NEC   Past Surgical History  Procedure Laterality Date  . Left shoulder surgery    . Bilateral salpingoophorectomy    . Biopsy thyroid    . Colon surgery     No family history on file. Social History  Substance Use Topics  . Smoking status: Former Smoker -- 0.50 packs/day for .5 years    Types: Cigarettes  . Smokeless tobacco: Never Used  . Alcohol Use: No    Review of Systems  All other systems reviewed and are negative.     Allergies  Penicillins; Wellbutrin; and Morphine and related  Home Medications   Prior to Admission medications   Medication Sig Start Date End Date Taking? Authorizing Provider  Homeopathic Products (HUMPHREYS COLD RELIEF #77 PO) Take 1 Package by mouth at bedtime. 7Cold   Yes Historical Provider, MD  albuterol (PROVENTIL HFA;VENTOLIN HFA) 108 (90 Base) MCG/ACT inhaler Inhale 2 puffs into the lungs every 4 (four) hours as needed for wheezing. 03/24/15   Jola Schmidt, MD  predniSONE (DELTASONE) 10 MG tablet Take 6 tablets (60 mg total) by mouth daily. 03/24/15   Jola Schmidt, MD   BP 149/83 mmHg  Pulse 84  Resp 17  Ht '5\' 9"'$  (1.753 m)  Wt 170 lb (77.111 kg)  BMI 25.09 kg/m2  SpO2 97% Physical Exam  Constitutional: He is oriented to person, place, and time. He appears well-developed and well-nourished.  HENT:  Head: Normocephalic and atraumatic.  Eyes: EOM are normal.  Neck: Normal range of motion.  Cardiovascular: Normal rate, regular rhythm, normal heart sounds and intact distal pulses.   Pulmonary/Chest: Effort normal. No respiratory distress. He has wheezes.  Abdominal: Soft. He exhibits no distension. There is no tenderness.  Musculoskeletal: Normal range of motion. He exhibits no edema or tenderness.  Neurological: He is alert and oriented to person, place, and time.  Skin: Skin is warm and dry.  Psychiatric: He has a normal mood and affect. Judgment normal.  Nursing note and vitals reviewed.   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 Lungs are hyperexpanded. There is scarring in the medial right base, stable. There is no edema or consolidation. Areas of mild interstitial prominence largely reflect redistribution of blood flow to viable lung segments. The heart size is normal. The pulmonary vascularity reflects the underlying bullous disease. No adenopathy. No bone lesions. IMPRESSION: Widespread bullous emphysematous change. Scarring right upper lobe. No edema or consolidation. No change in cardiac silhouette.  Electronically Signed   By: Lowella Grip III M.D.   On: 03/24/2015 12:39   I have personally reviewed and evaluated these images and lab results as part of my medical decision-making.   EKG Interpretation   Date/Time:  Thursday March 24 2015 11:06:44 EST Ventricular Rate:  105 PR Interval:  132 QRS Duration: 96 QT Interval:  362 QTC Calculation: 478 R Axis:   38 Text Interpretation:  Sinus tachycardia Incomplete right bundle branch  block Borderline ECG No old tracing to compare Confirmed by Camilo Mander  MD,  Lennette Bihari ([id number]) on 03/24/2015 12:31:36 PM      MDM   Final diagnoses:  COPD exacerbation [facility])    Patient feels much better after nebulized bronchodilators in the emergency department.  Cardizem given.  Given history and COPD patient be prescribed azithromycin in addition to prednisone for COPD exacerbation.  He understands to return to the ER for new or worsening symptoms.  I recommended every 4 hour albuterol at home over the next 48 hours in between as needed    Jola Schmidt, MD 03/24/15 1357

## 2015-03-24 NOTE — ED Notes (Signed)
MD at bedside. 

## 2015-03-24 NOTE — Discharge Instructions (Signed)

## 2015-03-24 NOTE — ED Notes (Signed)
Pt in x-ray.  They will bring pt to room when complete.

## 2017-04-09 ENCOUNTER — Ambulatory Visit (HOSPITAL_COMMUNITY)
Admission: EM | Admit: 2017-04-09 | Discharge: 2017-04-09 | Disposition: A | Discharge status: Home / Self Care | Payer: Medicare HMO | Attending: Family Medicine | Admitting: Family Medicine

## 2017-04-09 ENCOUNTER — Encounter (HOSPITAL_COMMUNITY): Payer: Self-pay | Admitting: Emergency Medicine

## 2017-04-09 DIAGNOSIS — J00 Acute nasopharyngitis [common cold]: Secondary | ICD-10-CM | POA: Diagnosis not present

## 2017-04-09 MED ORDER — TRIAMCINOLONE ACETONIDE 40 MG/ML IJ SUSP: 40.0000 mg | Freq: Once | INTRAMUSCULAR | Status: DC

## 2017-04-09 MED ORDER — IPRATROPIUM BROMIDE 0.06 % NA SOLN: 2.0000 | Freq: Three times a day (TID) | NASAL | 12 refills | Status: DC

## 2017-04-09 NOTE — ED Triage Notes (Signed)
Pt c/o cold symptoms, states "I think its pollen" Pt is taking allergy medicine OTC but its not helping. C/o off and on headaches, mucous, ear pain.

## 2017-04-09 NOTE — Discharge Instructions (Signed)
Push fluids to ensure adequate hydration and keep secretions thin.  Continue with daily claritin. May use nasal spray 2-4 times a day to help with congestion. If symptoms worsen or do not improve in the next week to return to be seen or to follow up with your PCP.

## 2017-04-09 NOTE — ED Provider Notes (Signed)
[place]    CSN: [id number] Arrival date & time: 04/09/17  1259     History   Chief Complaint Chief Complaint  Patient presents with  . URI    HPI Gregory Bautista is a 77 y.o. male.   [name] presents with complaints of congestion, ear pressure, headache which started ~1 week ago. States symptoms are worse at night. Without fevers. Feels similar to previous seasonal allergies but has not improved. Cough is productive of phlegm, primarily at night. Without shortness of breath. Denies gi/gu complaints. Has been taking daily claritin which has not helped much. States he does feel mildly better over the past week. Hx of allergic rhinitis, htn, asthma, fibromyalgia, depression, djd. Does not take any medications daily.   ROS per HPI.       Past Medical History:  Diagnosis Date  . Allergic rhinitis    NEC  . Cancer (HCC)    breast  . Cervical spondylosis   . Chronic pain syndrome   . Concussion   . Depression   . DJD (degenerative joint disease) of lumbar spine   . Fibrocystic breast   . Fibromyalgia   . Hyperlipidemia   . Hypertension   . OCD (obsessive compulsive disorder)   . Restless leg syndrome 10/24/09  . Tobacco use disorder 01/27/10    Patient Active Problem List   Diagnosis Date Noted  . Right inguinal hernia 04/01/2012  . Asthma, chronic 04/01/2012  . Hypertension 04/01/2012  . Other and unspecified hyperlipidemia 04/01/2012    Past Surgical History:  Procedure Laterality Date  . BILATERAL SALPINGOOPHORECTOMY    . BIOPSY THYROID    . COLON SURGERY    . left shoulder surgery         Home Medications    Prior to Admission medications   Medication Sig Start Date End Date Taking? Authorizing Provider  albuterol (PROVENTIL HFA;VENTOLIN HFA) 108 (90 Base) MCG/ACT inhaler Inhale 2 puffs into the lungs every 4 (four) hours as needed for wheezing. 03/24/15   Jola Schmidt, MD  Homeopathic Products ([place] #77 PO) Take 1  Package by mouth at bedtime. 7Cold    [provider]  ipratropium (ATROVENT) 0.06 % nasal spray Place 2 sprays into both nostrils 3 (three) times daily. 04/09/17   Zigmund Gottron, NP    Family History History reviewed. No pertinent family history.  Social History Social History   Tobacco Use  . Smoking status: Former Smoker    Packs/day: 0.50    Years: 0.50    Pack years: 0.25    Types: Cigarettes  . Smokeless tobacco: Never Used  Substance Use Topics  . Alcohol use: No  . Drug use: No     Allergies   Penicillins; Wellbutrin [bupropion]; and Morphine and related   Review of Systems Review of Systems   Physical Exam Triage Vital Signs ED Triage Vitals  Enc Vitals Group     BP 04/09/17 1330 120/75     Pulse Rate 04/09/17 1330 85     Resp 04/09/17 1330 16     Temp 04/09/17 1328 98.2 F (36.8 C)     Temp src --      SpO2 04/09/17 1330 100 %     Weight --      Height --      Head Circumference --      Peak Flow --      Pain Score --      Pain Loc --  Pain Edu? --      Excl. in [place]? --    No data found.  Updated Vital Signs BP 120/75   Pulse 85   Temp 98.2 F (36.8 C)   Resp 16   SpO2 100%   Visual Acuity Right Eye Distance:   Left Eye Distance:   Bilateral Distance:    Right Eye Near:   Left Eye Near:    Bilateral Near:     Physical Exam  Constitutional: He is oriented to person, place, and time. He appears well-developed and well-nourished.  HENT:  Head: Normocephalic and atraumatic.  Right Ear: Tympanic membrane, external ear and ear canal normal.  Left Ear: Tympanic membrane, external ear and ear canal normal.  Nose: Mucosal edema and rhinorrhea present. Right sinus exhibits no maxillary sinus tenderness and no frontal sinus tenderness. Left sinus exhibits no maxillary sinus tenderness and no frontal sinus tenderness.  Mouth/Throat: Uvula is midline, oropharynx is clear and moist and mucous membranes are normal.  Eyes:  Conjunctivae are normal. Pupils are equal, round, and reactive to light.  Neck: Normal range of motion.  Cardiovascular: Normal rate and regular rhythm.  Pulmonary/Chest: Effort normal and breath sounds normal.  Lymphadenopathy:    He has no cervical adenopathy.  Neurological: He is alert and oriented to person, place, and time.  Skin: Skin is warm and dry.  Vitals reviewed.    UC Treatments / Results  Labs (all labs ordered are listed, but only abnormal results are displayed) Labs Reviewed - No data to display  EKG  EKG Interpretation None       Radiology No results found.  Procedures Procedures (including critical care time)  Medications Ordered in UC Medications  triamcinolone acetonide (KENALOG-40) injection 40 mg (not administered)     Initial Impression / Assessment and Plan / UC Course  I have reviewed the triage vital signs and the nursing notes.  Pertinent labs & imaging results that were available during my care of the patient were reviewed by me and considered in my medical decision making (see chart for details).     Benign physical findings. Non toxic in appearance. Afebrile. Without tachycardia, tachypnea or hypoxia. Kenalog IM provided today, continue with daily claritin. atrovent as needed. If symptoms worsen or do not improve in the next week to return to be seen or to follow up with PCP.  Patient verbalized understanding and agreeable to plan.    Final Clinical Impressions(s) / UC Diagnoses   Final diagnoses:  Acute rhinitis    ED Discharge Orders        Ordered    ipratropium (ATROVENT) 0.06 % nasal spray  3 times daily     04/09/17 1405       Controlled Substance Prescriptions [place] Controlled Substance Registry consulted? Not Applicable   Zigmund Gottron, NP 04/09/17 1411

## 2019-09-24 DIAGNOSIS — F17200 Nicotine dependence, unspecified, uncomplicated: Secondary | ICD-10-CM | POA: Insufficient documentation

## 2019-12-04 ENCOUNTER — Emergency Department (HOSPITAL_COMMUNITY): Payer: Medicare Other

## 2019-12-04 ENCOUNTER — Emergency Department (HOSPITAL_COMMUNITY)
Admission: EM | Admit: 2019-12-04 | Discharge: 2019-12-04 | Disposition: A | Discharge status: Home / Self Care | Payer: Medicare Other | Attending: Emergency Medicine | Admitting: Emergency Medicine

## 2019-12-04 ENCOUNTER — Other Ambulatory Visit: Payer: Self-pay

## 2019-12-04 DIAGNOSIS — I10 Essential (primary) hypertension: Secondary | ICD-10-CM | POA: Insufficient documentation

## 2019-12-04 DIAGNOSIS — R0789 Other chest pain: Secondary | ICD-10-CM

## 2019-12-04 DIAGNOSIS — Z853 Personal history of malignant neoplasm of breast: Secondary | ICD-10-CM | POA: Insufficient documentation

## 2019-12-04 DIAGNOSIS — J45909 Unspecified asthma, uncomplicated: Secondary | ICD-10-CM | POA: Diagnosis not present

## 2019-12-04 DIAGNOSIS — Z87891 Personal history of nicotine dependence: Secondary | ICD-10-CM | POA: Diagnosis not present

## 2019-12-04 DIAGNOSIS — R918 Other nonspecific abnormal finding of lung field: Secondary | ICD-10-CM

## 2019-12-04 DIAGNOSIS — R0602 Shortness of breath: Secondary | ICD-10-CM | POA: Diagnosis not present

## 2019-12-04 LAB — CBC WITH DIFFERENTIAL/PLATELET
Abs Immature Granulocytes: 0.03 10*3/uL (ref 0.00–0.07)
Basophils Absolute: 0 10*3/uL (ref 0.0–0.1)
Basophils Relative: 0 %
Eosinophils Absolute: 0 10*3/uL (ref 0.0–0.5)
Eosinophils Relative: 0 %
HCT: 44.2 % (ref 39.0–52.0)
Hemoglobin: 14.1 g/dL (ref 13.0–17.0)
Immature Granulocytes: 0 %
Lymphocytes Relative: 11 %
Lymphs Abs: 0.9 10*3/uL (ref 0.7–4.0)
MCH: 28.7 pg (ref 26.0–34.0)
MCHC: 31.9 g/dL (ref 30.0–36.0)
MCV: 90 fL (ref 80.0–100.0)
Monocytes Absolute: 0.1 10*3/uL (ref 0.1–1.0)
Monocytes Relative: 1 %
Neutro Abs: 6.7 10*3/uL (ref 1.7–7.7)
Neutrophils Relative %: 88 %
Platelets: 229 10*3/uL (ref 150–400)
RBC: 4.91 MIL/uL (ref 4.22–5.81)
RDW: 13.5 % (ref 11.5–15.5)
WBC: 7.7 10*3/uL (ref 4.0–10.5)
nRBC: 0 % (ref 0.0–0.2)

## 2019-12-04 LAB — COMPREHENSIVE METABOLIC PANEL
ALT: 17 U/L (ref 0–44)
AST: 19 U/L (ref 15–41)
Albumin: 3.4 g/dL — ABNORMAL LOW (ref 3.5–5.0)
Alkaline Phosphatase: 73 U/L (ref 38–126)
Anion gap: 8 (ref 5–15)
BUN: 20 mg/dL (ref 8–23)
CO2: 22 mmol/L (ref 22–32)
Calcium: 10.1 mg/dL (ref 8.9–10.3)
Chloride: 110 mmol/L (ref 98–111)
Creatinine, Ser: 1.07 mg/dL (ref 0.61–1.24)
GFR, Estimated: >60 mL/min (ref >60)
Glucose, Bld: 118 mg/dL — ABNORMAL HIGH (ref 70–99)
Potassium: 4.1 mmol/L (ref 3.5–5.1)
Sodium: 140 mmol/L (ref 135–145)
Total Bilirubin: 0.9 mg/dL (ref 0.3–1.2)
Total Protein: 7.8 g/dL (ref 6.5–8.1)

## 2019-12-04 LAB — D-DIMER, QUANTITATIVE: D-Dimer, Quant: 0.76 ug/mL-FEU — ABNORMAL HIGH (ref 0.00–0.50)

## 2019-12-04 IMAGING — CT CT CHEST W/ CM
2 of 3 series · 15 of 36 positions shown, 18 images · IV contrast (omnipaque)
Comparison: Chest radiograph dated [DATE].

CLINICAL DATA: 79-year-old male with lung nodule.

EXAM:
CT CHEST WITH CONTRAST
TECHNIQUE: Multidetector CT imaging of the chest was performed during
intravenous contrast administration.
CONTRAST:  80mL OMNIPAQUE IOHEXOL 300 MG/ML  SOLN

[Series 4: thorax 2.0 i31f 2 · axial · 0.84mm/px · z∈[+891,+1203]mm · 12 of 184 slices shown, 15 images]
[im 14/184  mediastinal]
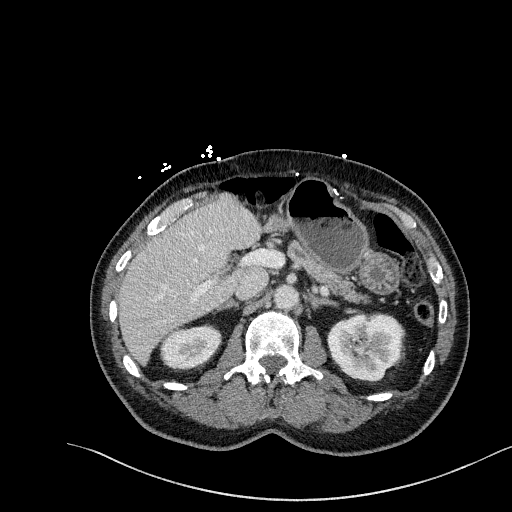
[im 14/184  lung]
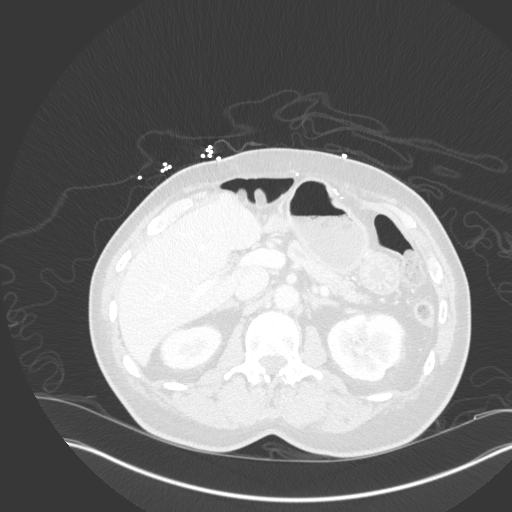
[im 28/184  lung]
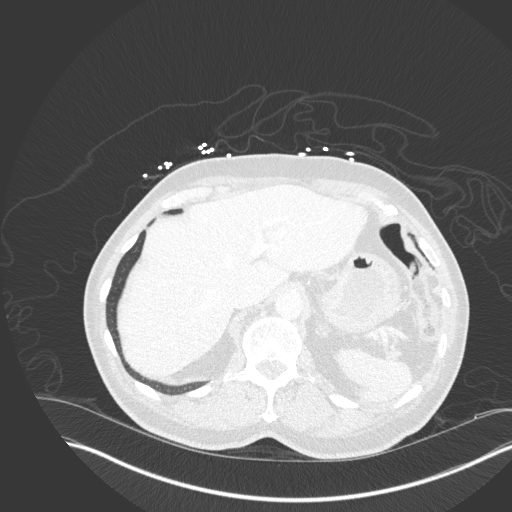
[im 41/184  lung]
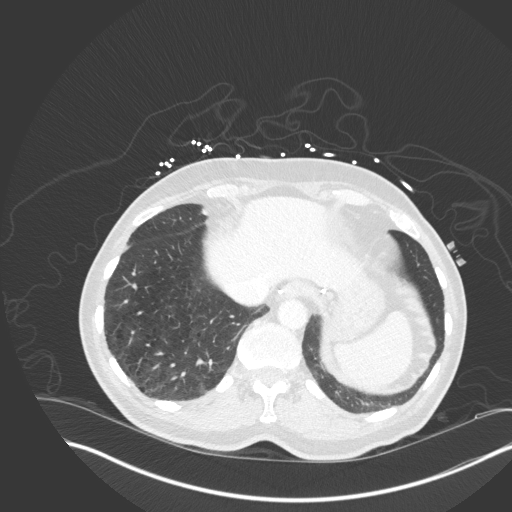
[im 55/184  lung]
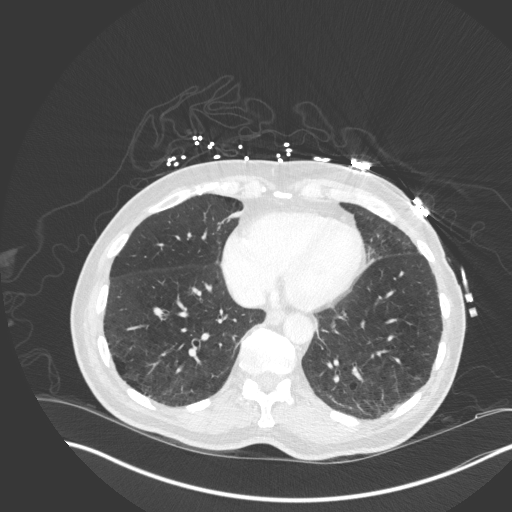
[im 68/184  mediastinal]
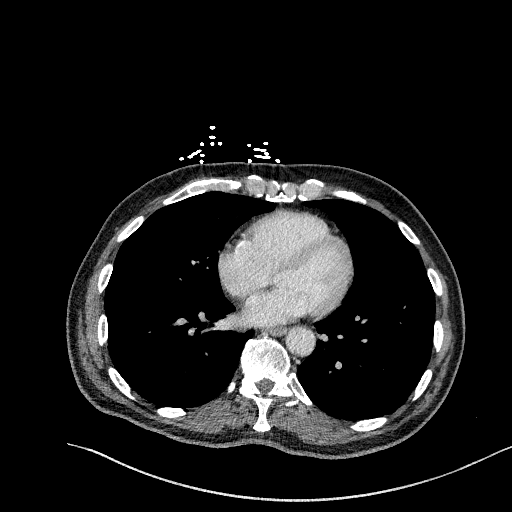
[im 68/184  lung]
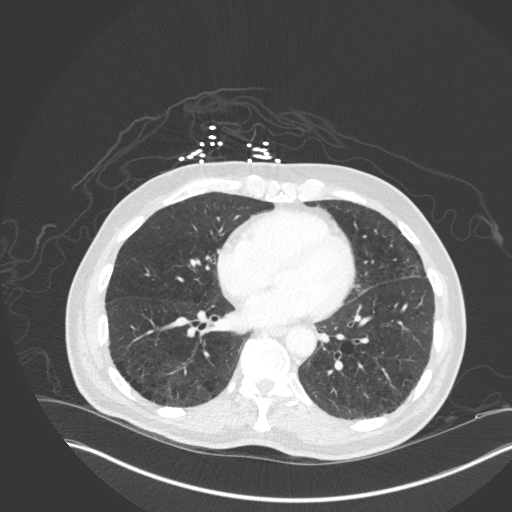
[im 82/184  lung]
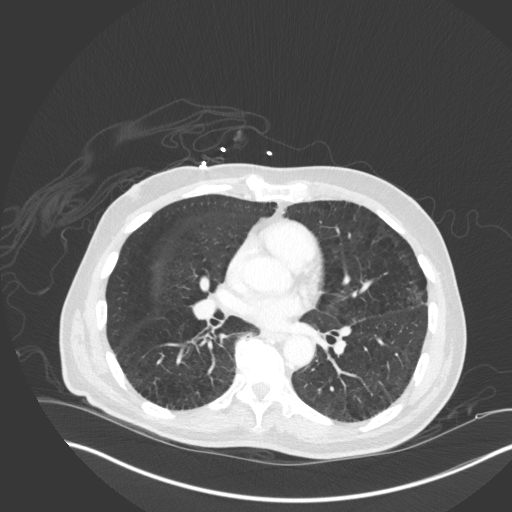
[im 102/184  lung]
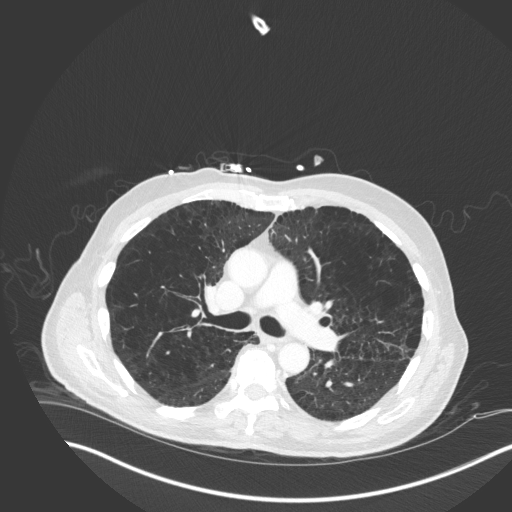
[im 116/184  lung]
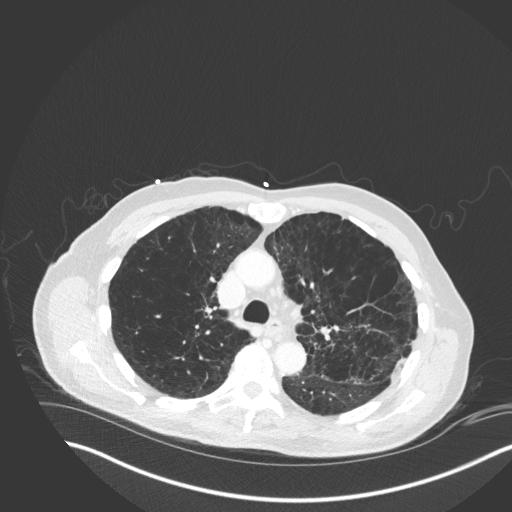
[im 129/184  mediastinal]
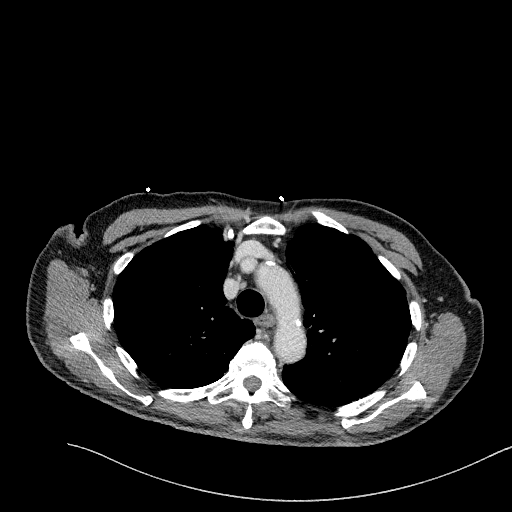
[im 129/184  lung]
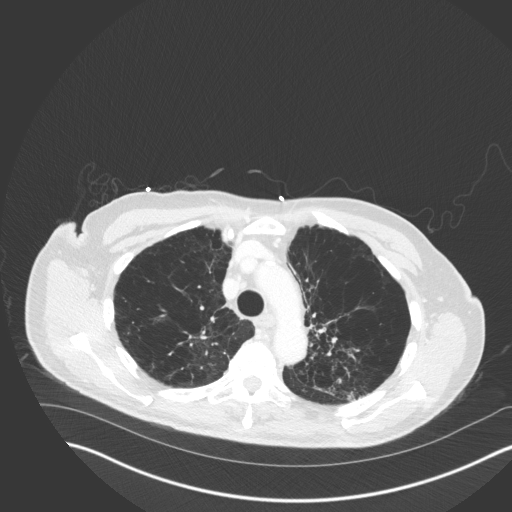
[im 143/184  lung]
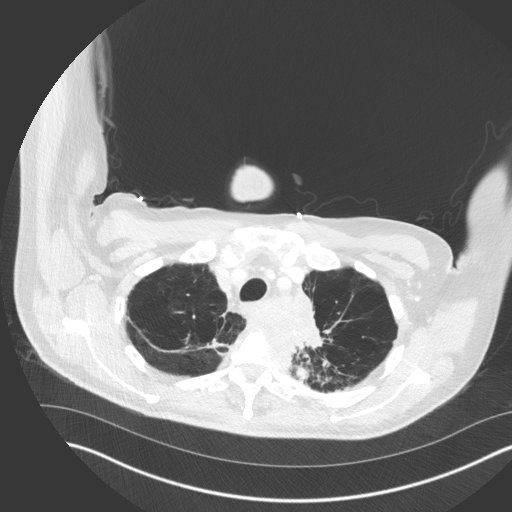
[im 156/184  lung]
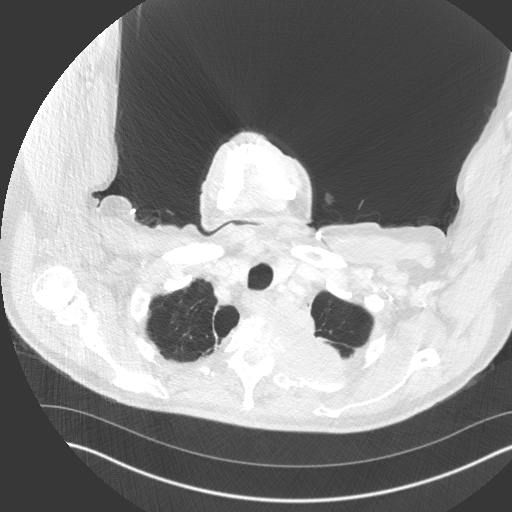
[im 170/184  lung]
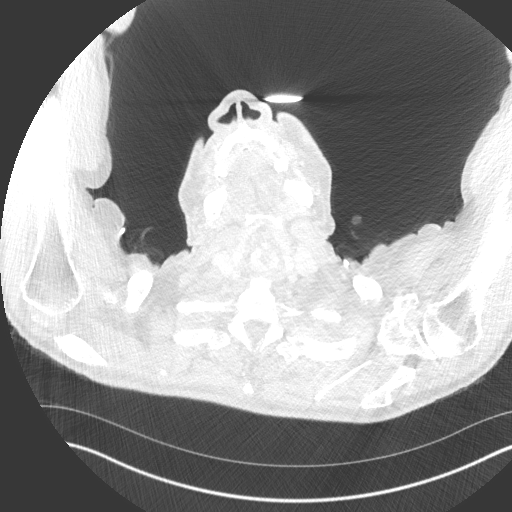

[Series 5: coronal · coronal · 0.72mm/px · 3 of 151 slices shown]
[im 31/151  lung]
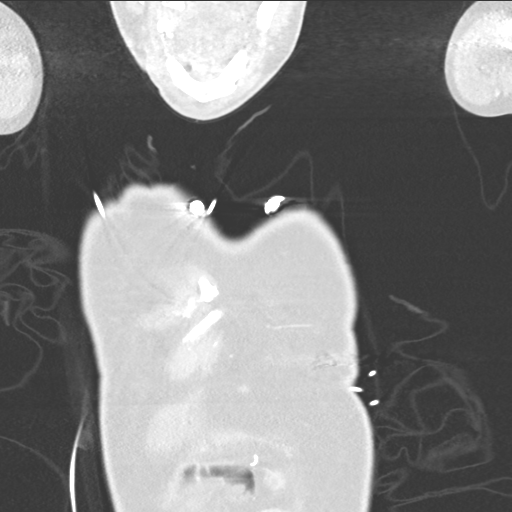
[im 61/151  lung]
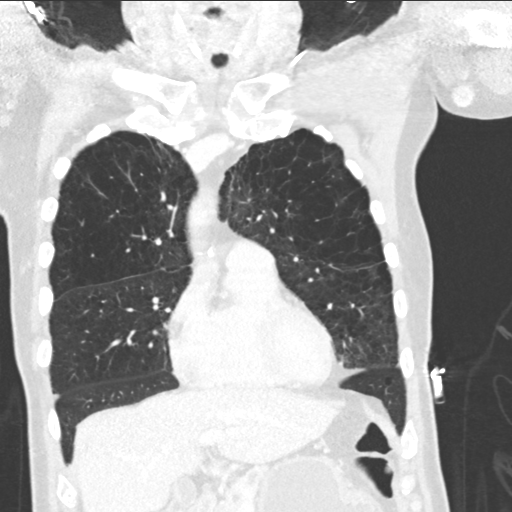
[im 91/151  lung]
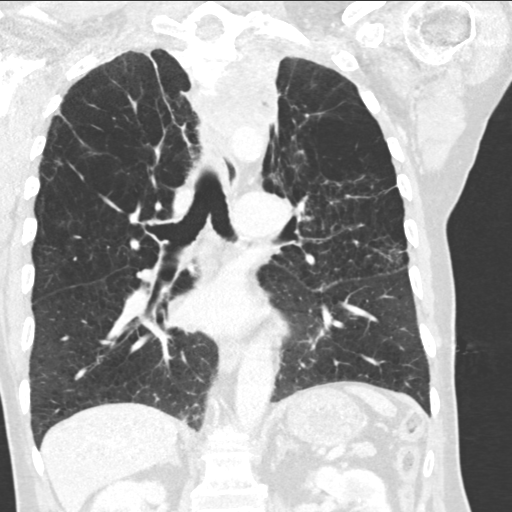

[15 of 36 positions shown; findings below may reference images not displayed]

FINDINGS: Cardiovascular: There is no cardiomegaly or pericardial effusion.
There is mild atherosclerotic calcification of the thoracic aorta.
No aneurysmal dilatation or dissection. The central pulmonary
arteries are unremarkable.

Mediastinum/Nodes: No hilar or mediastinal adenopathy. Multiple
small mildly rounded mediastinal nodes. No mediastinal fluid
collection.

Lungs/Pleura: There is a 7.7 x 5.6 cm left upper lobe pleural based
mass extending into the upper mediastinum. The mass abuts the
superior aspect of the aortic arch and left posterior wall of the
upper esophagus. There is severe emphysema. There is no pleural
effusion pneumothorax. The central airways are patent.

Upper Abdomen: Subcentimeter left hepatic hypodense focus is too
small to characterize. Multiple small bilateral renal hypodense
lesions are not characterized.

Musculoskeletal: There is erosive changes of the T2, T3, and T4
vertebra due to infiltration of the left upper lobe mass. There are
destructive changes of the posterior left third and fourth ribs.
There is probable extension of the mass into left T3-T4 neural
foramina and central canal at T3 and T4 level. Further evaluation
with MRI without and with contrast recommended. There is compression
fracture of T3 with approximately 50% loss of vertebral body height,
age indeterminate.
IMPRESSION: 1. Left upper lobe pleural based mass/malignancy extending into the
upper mediastinum and upper thoracic spine. Further evaluation with
MRI without and with contrast recommended.
2. Aortic Atherosclerosis ([X0]-[X0]) and Emphysema ([X0]-[X0]).

## 2019-12-04 IMAGING — DX DG CHEST 2V
2 series · 2 of 2 positions shown · non-contrast
Comparison: [DATE]

CLINICAL DATA: Hemoptysis, former smoker, hypertension, history
breast cancer

EXAM:
CHEST - 2 VIEW

[chest pa]
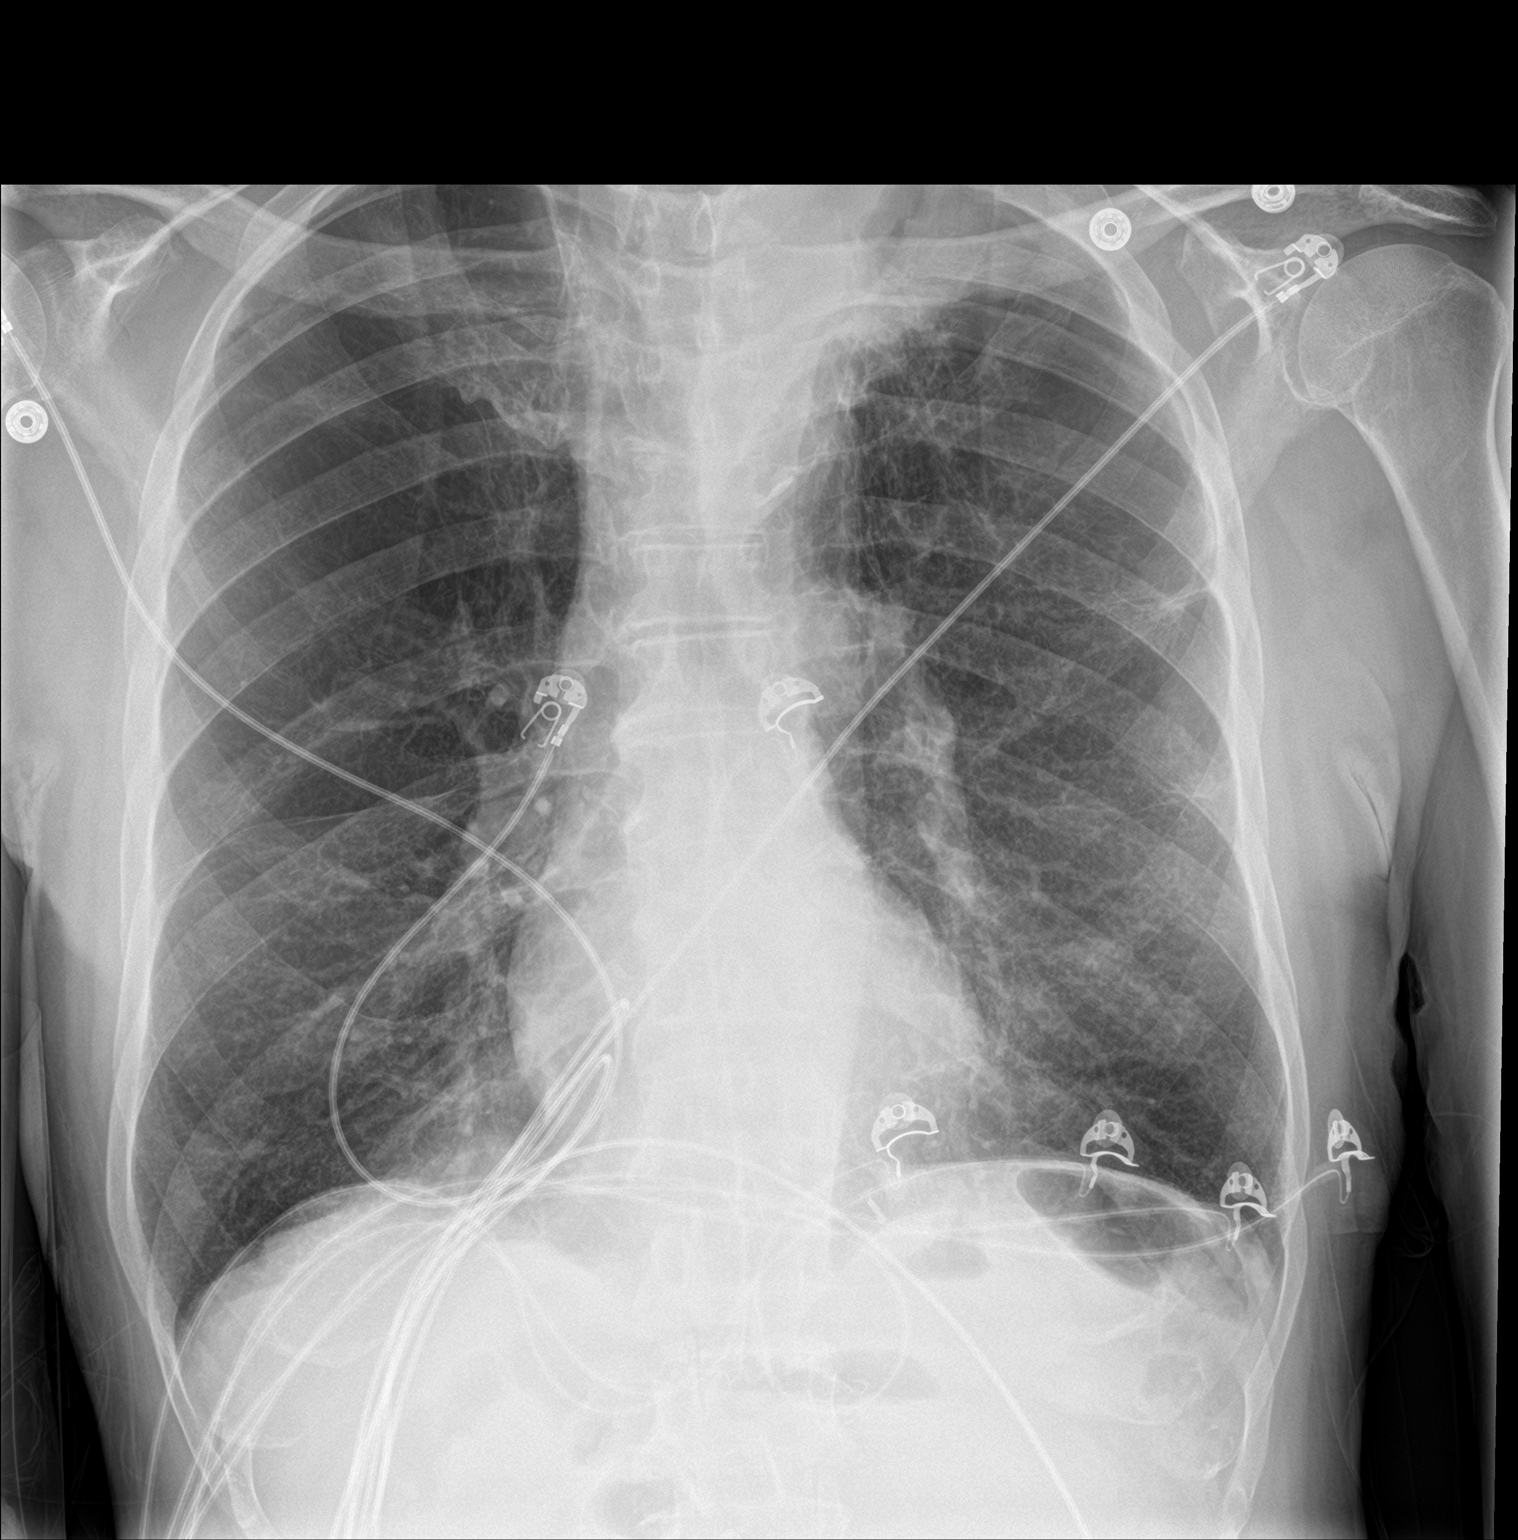

[chest lat]
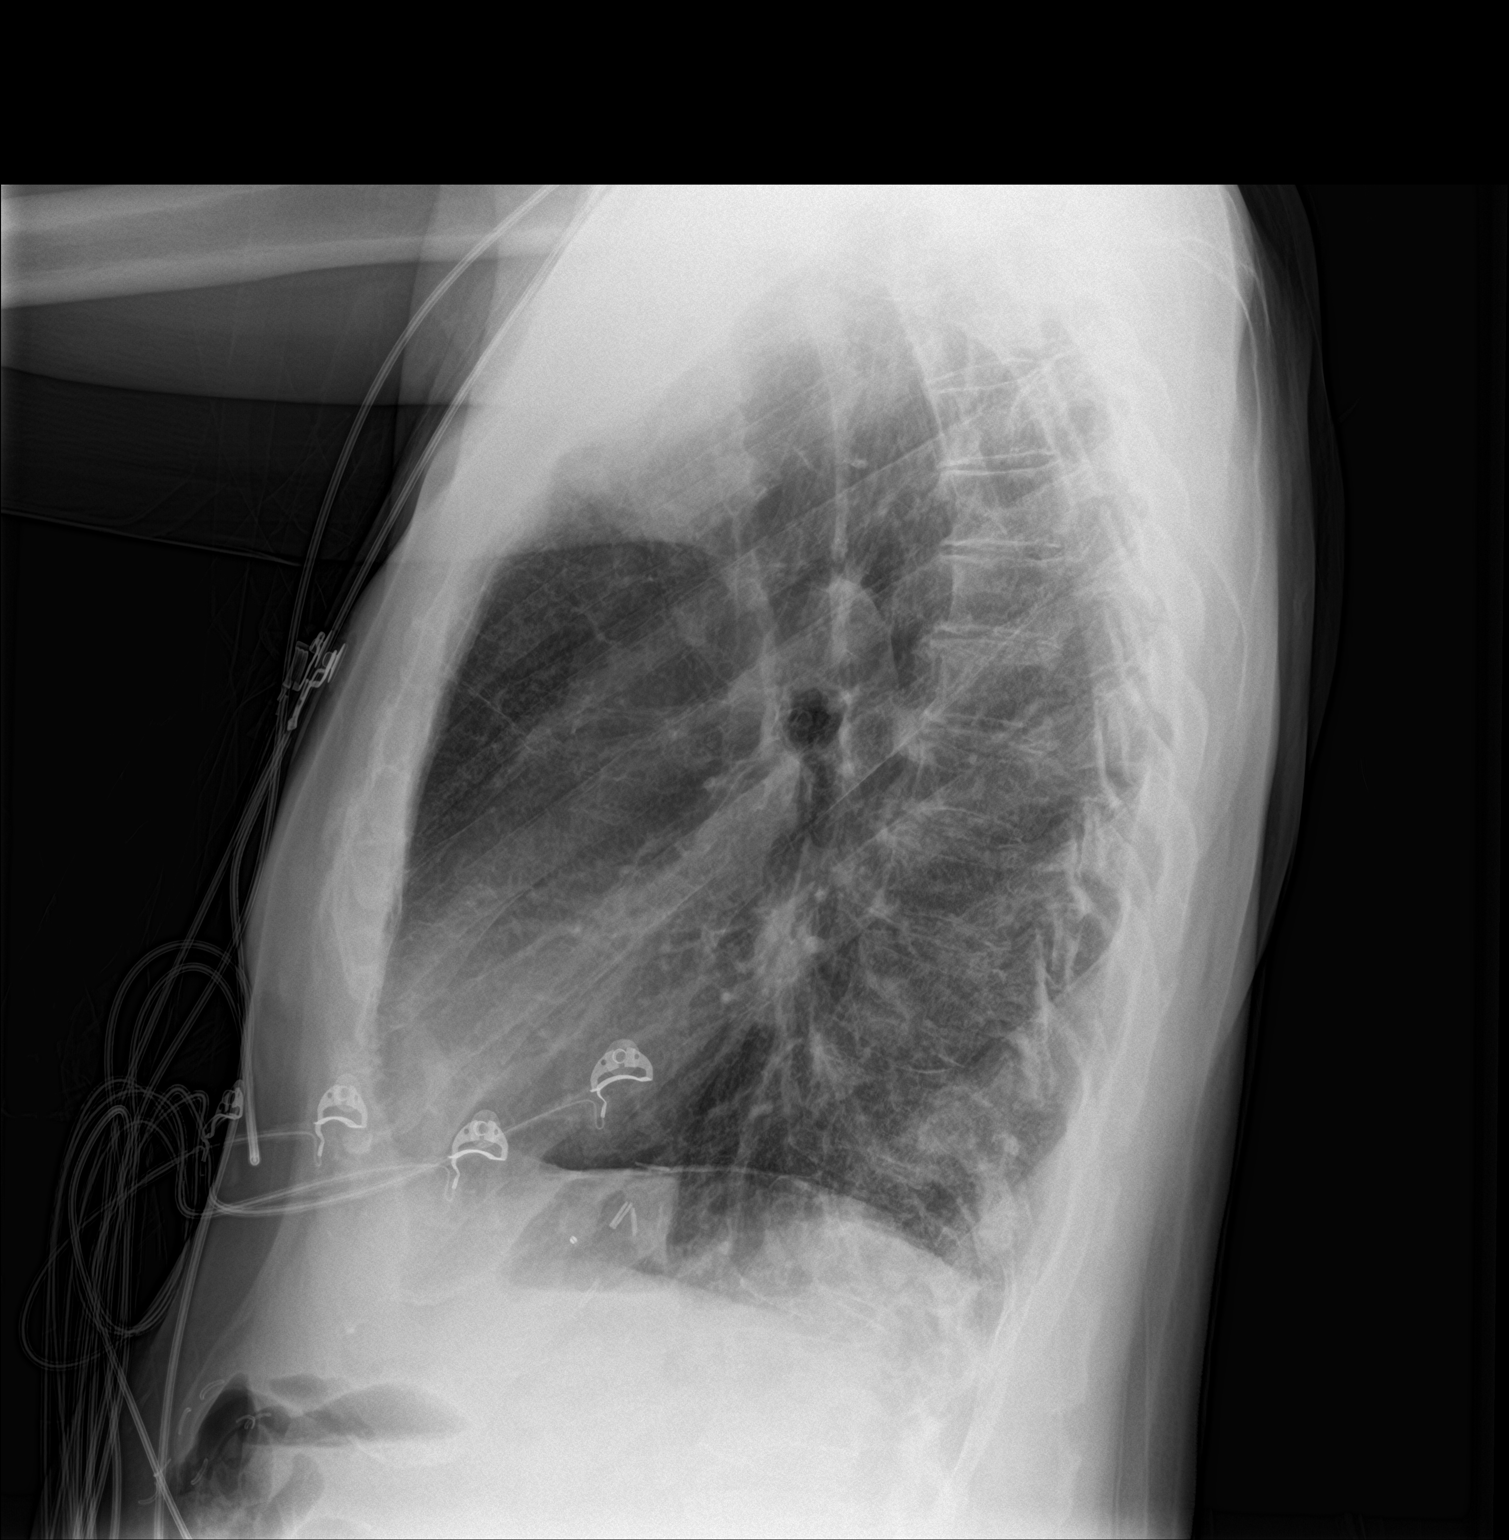

[2 of 2 positions shown; findings below may reference images not displayed]

FINDINGS: Normal heart size and pulmonary vascularity.

RIGHT apex scarring.

New extensive opacity at the medial LEFT upper lobe approximately
9.1 x 5.5 cm in size with pleuroparenchymal opacity, volume loss in
in adjacent LEFT upper lobe, and associated destruction of the
posterior LEFT third and fourth ribs and the LEFT transverse process
of T3.

Findings are compatible with a LEFT upper lobe neoplasm with chest
wall and probable mediastinal invasion.

Foci of scarring in LEFT mid lung.

Underlying emphysematous and chronic bronchitic changes.

No pleural effusion or pneumothorax.
IMPRESSION: New large opacity at medial LEFT apex likely representing a Pancoast
tumor of the posteromedial LEFT apex; further evaluation by chest CT
with contrast recommended.

Findings discussed with Dr. ZAPIEN on [DATE] at [U7] hours.

## 2019-12-04 MED ORDER — METHOCARBAMOL 500 MG PO TABS
500.0000 mg | ORAL_TABLET | Freq: Once | ORAL | Status: AC
  Administered 2019-12-04: 500 mg via ORAL
  Filled 2019-12-04: qty 1

## 2019-12-04 MED ORDER — IOHEXOL 300 MG/ML  SOLN
80.0000 mL | Freq: Once | INTRAMUSCULAR | Status: AC | PRN
  Administered 2019-12-04: 80 mL via INTRAVENOUS

## 2019-12-04 NOTE — Discharge Instructions (Addendum)
You had a CT scan performed in the emergency department today that shows a lung mass that is concerning for cancer. Please follow-up with your family doctor, Dr. Jeanie Cooks on Monday at his [facility] office at 1215.

## 2019-12-04 NOTE — ED Provider Notes (Signed)
Patient care assumed at 1500.  Patient here for evaluation of two months of progressive chest wall pain. He was seen by orthopedics today and referred to the emergency department. Chest x-ray concerning for tumor and CT chest pending.  CT is concerning for malignancy. Discussed with oncologist on-call, recommends referral to IR or pulmonary for biopsy prior to referral to oncology. Discussed with Dr. Jeanie Cooks, patient's PCP. He will see the patient on Monday for further workup and referral.   Quintella Reichert, MD 12/04/19 1843

## 2019-12-04 NOTE — ED Provider Notes (Signed)
[facility] EMERGENCY DEPARTMENT Provider Note   CSN: [id number] Arrival date & time: 12/04/19  1044     History Chief Complaint  Patient presents with  . Chest Pain    Gregory Bautista is a 79 y.o. male.  HPI   Gregory Bautista is a 79 y.o. gentleman with hx fibromyalgia, HTN, HLD, and chronic pain syndrome, cervical spondylosis, and breast cancer per chart records although patient denies hx of cancer, presenting for left-sided chest pain and shortness of breath. He states that his pain began about 2 months ago under his left scapula, and radiates over his trapezius into his left chest and under his left axilla. His pain has been constant since onset. It worsens with movement. He received a steroid shot by his PCP to his left shoulder, with no improvement, and he denies improvement of his symptoms with NSAIDs and Tylenol. He decided to come to the ED because he also developed acute shortness of breath this morning. He states he has a history of asthma and that he uses his inhaler 2-3 times per day with improvement of his SOB. He denies any wheezing. He had a small amount of dark red blood in his sputum after coughing 2 days ago and today, but denies other history of hemoptysis. He does smoke 5-6 cigarettes per day with a long smoking history. He endorses occasional heart burn, not on any antacids, but denies any abdominal pain, changes in stools, fevers, chills. He notes right-sided rib pain with deep breathing and intermittent right leg swelling without pain or discoloration. He denies history of blood clots, prolonged immobility, recent procedures, or hormonal replacement therapy. No sick contacts.  Past Medical History:  Diagnosis Date  . Allergic rhinitis    NEC  . Cancer (HCC)    breast  . Cervical spondylosis   . Chronic pain syndrome   . Concussion   . Depression   . DJD (degenerative joint disease) of lumbar spine   . Fibrocystic breast   . Fibromyalgia   .  Hyperlipidemia   . Hypertension   . OCD (obsessive compulsive disorder)   . Restless leg syndrome 10/24/09  . Tobacco use disorder 01/27/10    Patient Active Problem List   Diagnosis Date Noted  . Right inguinal hernia 04/01/2012  . Asthma, chronic 04/01/2012  . Hypertension 04/01/2012  . Other and unspecified hyperlipidemia 04/01/2012    Past Surgical History:  Procedure Laterality Date  . BILATERAL SALPINGOOPHORECTOMY    . BIOPSY THYROID    . COLON SURGERY    . left shoulder surgery         No family history on file.  Social History   Tobacco Use  . Smoking status: Former Smoker    Packs/day: 0.50    Years: 0.50    Pack years: 0.25    Types: Cigarettes  . Smokeless tobacco: Never Used  Substance Use Topics  . Alcohol use: No  . Drug use: No    Home Medications Prior to Admission medications   Medication Sig Start Date End Date Taking? Authorizing Provider  albuterol (PROVENTIL HFA;VENTOLIN HFA) 108 (90 Base) MCG/ACT inhaler Inhale 2 puffs into the lungs every 4 (four) hours as needed for wheezing. 03/24/15   Jola Schmidt, MD  Homeopathic Products ([place] #77 PO) Take 1 Package by mouth at bedtime. 7Cold    [provider]  ipratropium (ATROVENT) 0.06 % nasal spray Place 2 sprays into both nostrils 3 (three) times daily. 04/09/17  Zigmund Gottron, NP    Allergies    Penicillins, Wellbutrin [bupropion], and Morphine and related  Review of Systems   Review of Systems: 10 point review of systems otherwise negative except as noted above in HPI.  Physical Exam Updated Vital Signs BP (!) 183/93   Pulse 81   Temp 98.6 F (37 C) (Oral)   Resp 20   Ht 5\' 9"  (1.753 m)   Wt 74.8 kg   SpO2 96%   BMI 24.37 kg/m   Physical Exam  General: Patient appears well. No acute distress. Eyes: Sclera non-icteric. No conjunctival injection.  HENT: Neck is supple. No nasal discharge. Moist mucus membranes.  Respiratory: Lungs are CTA, bilaterally.  No wheezes, rales, or rhonchi.  Cardiovascular: Regular rate and rhythm. No murmurs, rubs, or gallops. No lower extremity edema.  Abdominal: Soft and non-tender to palpation. Bowel sounds intact. No rebound or guarding. Musculoskeletal: There is left chest wall tenderness and diffuse tenderness overlying left lateral and posterior ribs. Normal muscle bulk and tone. No bony tenderness.  Skin: Dry skin with cracking, especially overlying the ribs and lower back. No lesions or rashes noted. Healed abdominal scarring.  Psych: Normal affect. Normal tone of voice.   ED Results / Procedures / Treatments   Labs (all labs ordered are listed, but only abnormal results are displayed) Labs Reviewed  COMPREHENSIVE METABOLIC PANEL - Abnormal; Notable for the following components:      Result Value   Glucose, Bld 118 (*)    Albumin 3.4 (*)    All other components within normal limits  D-DIMER, QUANTITATIVE (NOT AT [facility]) - Abnormal; Notable for the following components:   D-Dimer, Quant 0.76 (*)    All other components within normal limits  CBC WITH DIFFERENTIAL/PLATELET    EKG EKG Interpretation  Date/Time:  Friday December 04 2019 10:49:55 EST Ventricular Rate:  79 PR Interval:    QRS Duration: 111 QT Interval:  359 QTC Calculation: 412 R Axis:   -50 Text Interpretation: Sinus rhythm Left axis deviation RSR' in V1 or V2, right VCD or RVH No significant change since last tracing Abnormal ECG Confirmed by Carmin Muskrat [phone]) on 12/04/2019 10:56:11 AM   Radiology DG Chest 2 View  Result Date: 12/04/2019 CLINICAL DATA:  Hemoptysis, former smoker, hypertension, history breast cancer EXAM: CHEST - 2 VIEW COMPARISON:  03/24/2015 FINDINGS: Normal heart size and pulmonary vascularity. RIGHT apex scarring. New extensive opacity at the medial LEFT upper lobe approximately 9.1 x 5.5 cm in size with pleuroparenchymal opacity, volume loss in in adjacent LEFT upper lobe, and associated destruction of  the posterior LEFT third and fourth ribs and the LEFT transverse process of T3. Findings are compatible with a LEFT upper lobe neoplasm with chest wall and probable mediastinal invasion. Foci of scarring in LEFT mid lung. Underlying emphysematous and chronic bronchitic changes. No pleural effusion or pneumothorax. 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 Findings discussed with Dr. Vanita Panda on 01/03/2020 at 1230 hours. Electronically Signed   By: Lavonia Dana M.D.   On: 12/04/2019 12:32    Procedures Procedures (including critical care time)  Medications Ordered in ED Medications  methocarbamol (ROBAXIN) tablet 500 mg (500 mg Oral Given 12/04/19 1218)    ED Course  I have reviewed the triage vital signs and the nursing notes.  Pertinent labs & imaging results that were available during my  care of the patient were reviewed by me and considered in my medical decision making (see chart for details).    MDM Rules/Calculators/A&P                          Patient presents with atypical left-sided chest pain that is reproducible and worsened by movement, radiating from the scapula. His symptoms and physical examination are most consistent with musculoskeletal pain, although this hasn't improved with steroid injection, NSAIDs, or Tylenol. His shortness of breath sounds chronic in the setting of COPD, although mild hemoptysis is new. Patient is at increased risk of cancer due to prolonged smoking history and age. Given pleuritic nature of pain of his right side and intermittent subjective RLE swelling (although examination is normal), will check d-dimer. Patient is at low risk of PE given absence of risk factors listed above and unremarkable vital signs.   Plan: Will check EKG, CBC with differential to rule out infection, CXR to rule out mass or PNA, and D-dimer to rule  out PE.  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 There is also mild pulmonary vascular congestion. Discussed these findings with patient and he expressed understanding; all questions were answered.  - Will get CT chest with contrast for further evaluation   1:05pm: CBC is unremarkable D-dimer is elevated to 0.76, which is more likely related to findings concerning for pancoast tumor as per CXR than PE. Will check CT chest with contrast.   1:52pm: CMP and differential are unremarkable.  - CT chest with contrast pending  - Given otherwise unremarkable labs with hemodynamic stability during ED stay, patient should be stable for discharge home with urgent outpatient cancer center vs. Multidisciplinary follow up for further evaluation of CXR findings.   Final Clinical Impression(s) / ED Diagnoses Final diagnoses:  Atypical chest pain  Chest wall pain    Rx / DC Orders ED Discharge Orders    None     Jeralyn Bennett, MD 12/04/2019, 3:50 PM Pager: [phone]    Jeralyn Bennett, MD 12/04/19 1551    Carmin Muskrat, MD 12/07/19 2351

## 2019-12-04 NOTE — ED Notes (Signed)
Patient taken to xray.

## 2019-12-04 NOTE — ED Notes (Signed)
Pt in CT.

## 2019-12-04 NOTE — ED Triage Notes (Signed)
BIB [facility] from an orthopedic clinic for evaluation of chest pain that the patient has had for two months constantly. Pt reports the pain has been constant over the past 2 months that is L sided radiating to the left armpit. Pt reports the pain is stabbing and rates it a 9 out of 10. Pt reports the doctor has given him medicine - he cant recall what the medicine is, but states it isnt working and he cant sleep at night. Pt reports coughing this morning and reports a productive red sputum. Pt felt that his "breath" was worsening today and so he wanted to be evaluated at the hospital. VSS. W/ EMS BP- 160/100 Pulse-75 Resp- 16 SpO2- 98%

## 2019-12-04 NOTE — ED Notes (Signed)
Patient verbalizes understanding of discharge instructions. Opportunity for questioning and answers were provided. Arm band removed by staff, patient discharged from ED. 

## 2019-12-22 ENCOUNTER — Institutional Professional Consult (permissible substitution): Payer: Medicare Other | Admitting: Thoracic Surgery (Cardiothoracic Vascular Surgery)

## 2019-12-22 ENCOUNTER — Encounter: Payer: Self-pay | Admitting: Thoracic Surgery (Cardiothoracic Vascular Surgery)

## 2019-12-22 ENCOUNTER — Other Ambulatory Visit: Payer: Self-pay

## 2019-12-22 ENCOUNTER — Other Ambulatory Visit: Payer: Self-pay | Admitting: Thoracic Surgery (Cardiothoracic Vascular Surgery)

## 2019-12-22 VITALS — BP 117/75 | HR 93 | Resp 18 | Ht 69.0 in | Wt 159.0 lb

## 2019-12-22 DIAGNOSIS — R918 Other nonspecific abnormal finding of lung field: Secondary | ICD-10-CM

## 2019-12-22 MED ORDER — OXYCODONE HCL 5 MG PO TABS
5.0000 mg | ORAL_TABLET | ORAL | 0 refills | Status: AC | PRN
Start: 2019-12-22 — End: 2019-12-28

## 2019-12-22 NOTE — Progress Notes (Signed)
PCP is [name], MD Referring Provider is [name], MD  Chief Complaint  Patient presents with  . Lung Mass    CT chest 11/12  . Consult    initial surgical consult    HPI: Gregory Bautista sent for consultation regarding a left apical mass  Gregory Bautista is a 79 year old man with a history of tobacco abuse who presented with left chest and shoulder pain. He has been having this pain off and on for almost 3 months. It is mostly in the left chest and down the underside of his left arm. He also has left scapular pain in the back on the left side. He thought this might be arthritis. About 2 weeks ago the pain became unbearable and he went to the emergency room. A CT of the chest was done which showed a left apical mass with chest wall invasion, mediastinal invasion, and involvement of upper thoracic vertebrae. He smoked a pack a day or less for 60 years prior to quitting about 3 months ago.   He smoked a pack a day or less for 60 years prior to quitting 3 months ago. He has had a cough and has noted some small amounts of blood in that. He has lost about 5 to 10 pounds over the past 3 months. His pain has become constant. Past Medical History:  Diagnosis Date  . Allergic rhinitis    NEC  . Cancer (HCC)    breast  . Cervical spondylosis   . Chronic pain syndrome   . Concussion   . Depression   . DJD (degenerative joint disease) of lumbar spine   . Fibrocystic breast   . Fibromyalgia   . Hyperlipidemia   . Hypertension   . OCD (obsessive compulsive disorder)   . Restless leg syndrome 10/24/09  . Tobacco use disorder 01/27/10    Past Surgical History:  Procedure Laterality Date  . BILATERAL SALPINGOOPHORECTOMY    . BIOPSY THYROID    . COLON SURGERY    . left shoulder surgery      No family history on file.  Social History Social History   Tobacco Use  . Smoking status: Former Smoker    Packs/day: 0.50    Years: 0.50    Pack years: 0.25    Types: Cigarettes  .  Smokeless tobacco: Never Used  Substance Use Topics  . Alcohol use: No  . Drug use: No    Current Outpatient Medications  Medication Sig Dispense Refill  . cetirizine (ZYRTEC) 10 MG tablet Take 10 mg by mouth daily.    . diclofenac Sodium (VOLTAREN) 1 % GEL Apply topically.    [name] ibuprofen (ADVIL) 800 MG tablet Take 800 mg by mouth 3 (three) times daily.    [name] latanoprost (XALATAN) 0.005 % ophthalmic solution SMARTSIG:In Eye(s)    . methylPREDNISolone (MEDROL DOSEPAK) 4 MG TBPK tablet Take by mouth.    . predniSONE (DELTASONE) 20 MG tablet Take 20 mg by mouth 2 (two) times daily.    . SYMBICORT 160-4.5 MCG/ACT inhaler Inhale into the lungs.    [name] tiZANidine (ZANAFLEX) 4 MG tablet Take 4 mg by mouth 2 (two) times daily.    [name] oxyCODONE (OXY IR/ROXICODONE) 5 MG immediate release tablet Take 1 tablet (5 mg total) by mouth every 4 (four) hours as needed for up to 5 days for severe pain. 20 tablet 0   No current facility-administered medications for this visit.    Allergies  Allergen Reactions  . Penicillins  Anaphylaxis    Has patient had a PCN reaction causing immediate rash, facial/tongue/throat swelling, SOB or lightheadedness with hypotension: Yes Has patient had a PCN reaction causing severe rash involving mucus membranes or skin necrosis: No Has patient had a PCN reaction that required hospitalization No Has patient had a PCN reaction occurring within the last 10 years: Yes If all of the above answers are "NO", then may proceed with Cephalosporin use.   . Wellbutrin [Bupropion] Other (See Comments)    Seizures  . Morphine And Related Itching    Review of Systems  Constitutional: Positive for activity change, appetite change and unexpected weight change.  Eyes: Positive for visual disturbance (Glaucoma).  Respiratory: Positive for cough. Negative for shortness of breath and wheezing.   Cardiovascular: Positive for chest pain (See HPI).  Musculoskeletal: Positive for arthralgias,  back pain and myalgias.  Neurological: Negative for seizures and weakness.  All other systems reviewed and are negative.   BP 117/75 (BP Location: Left Arm, Patient Position: Sitting, Cuff Size: Normal)   Pulse 93   Resp 18   Ht 5\' 9"  (1.753 m)   Wt 159 lb (72.1 kg)   SpO2 99% Comment: RA with mask on  BMI 23.48 kg/m  Physical Exam Vitals reviewed.  Constitutional:      General: He is not in acute distress.    Appearance: Normal appearance.  HENT:     Head: Normocephalic and atraumatic.  Eyes:     General: No scleral icterus.    Extraocular Movements: Extraocular movements intact.  Cardiovascular:     Rate and Rhythm: Normal rate and regular rhythm.     Heart sounds: Normal heart sounds. No murmur heard.   Pulmonary:     Effort: Pulmonary effort is normal. No respiratory distress.     Breath sounds: Normal breath sounds. No wheezing or rales.  Abdominal:     General: There is no distension.     Palpations: Abdomen is soft.  Musculoskeletal:     Cervical back: Neck supple.     Comments: Limited range of motion left shoulder  Lymphadenopathy:     Cervical: No cervical adenopathy.  Skin:    General: Skin is warm and dry.  Neurological:     General: No focal deficit present.     Mental Status: He is alert and oriented to person, place, and time.     Cranial Nerves: No cranial nerve deficit.    Diagnostic Tests: CT CHEST WITH CONTRAST  TECHNIQUE: Multidetector CT imaging of the chest was performed during intravenous contrast administration.  CONTRAST:  2mL OMNIPAQUE IOHEXOL 300 MG/ML  SOLN  COMPARISON:  Chest radiograph dated 12/04/2019.  FINDINGS: Cardiovascular: There is no cardiomegaly or pericardial effusion. There is mild atherosclerotic calcification of the thoracic aorta. No aneurysmal dilatation or dissection. The central pulmonary arteries are unremarkable.  Mediastinum/Nodes: No hilar or mediastinal adenopathy. Multiple small mildly rounded  mediastinal nodes. No mediastinal fluid collection.  Lungs/Pleura: There is a 7.7 x 5.6 cm left upper lobe pleural based mass extending into the upper mediastinum. The mass abuts the superior aspect of the aortic arch and left posterior wall of the upper esophagus. 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 There is no pleural effusion pneumothorax. The central airways are patent.  Upper Abdomen: Subcentimeter left hepatic hypodense focus is too small to characterize. Multiple small bilateral renal hypodense lesions are not characterized.  Musculoskeletal: There is erosive changes of the T2, T3, and T4 vertebra due to infiltration of the left upper  lobe mass. There are destructive changes of the posterior left third and fourth ribs. There is probable extension of the mass into left T3-T4 neural foramina and central canal at T3 and T4 level. Further evaluation with MRI without and with contrast recommended. There is compression fracture of T3 with approximately 50% loss of vertebral body height, age indeterminate.  IMPRESSION: 1. 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 Further evaluation with MRI without and with contrast recommended. 2. Aortic Atherosclerosis (ICD10-I70.0) and Emphysema (ICD10-J43.9).   Electronically Signed   By: Anner Crete M.D.   On: 12/04/2019 16:38 I personally reviewed the CT images and concur with the findings noted above  Impression: Gregory Bautista is a 79 year old man with a history of tobacco abuse who presents with chest and left shoulder pain. He has also had some cough and some mild hemoptysis. He had a CT of the chest which showed a 7 cm apical lung mass with invasion of the mediastinum and upper thoracic vertebral column. 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  Need to proceed as quickly as possible with diagnosis and staging. He  needs a PET/CT for staging and to help guide initial therapy. He also needs a CT-guided needle biopsy. We will schedule that as soon as possible.  This is not surgically resectable due to the extensive spinal involvement. He will need to be treated with chemotherapy and radiation. I will make a referral to our multidisciplinary thoracic oncology clinic to see both oncology and radiation oncology.  Pain control is a significant issue. He is having severe pain and is not getting relief with tramadol. I am going to give him a prescription for oxycodone 5 mg tablets 1 p.o. every 4 hours as needed for pain. Since this is acute pain we will start him with 20 tablets. He will need refills and I will be happy to provide that for him until he establishes with oncology and radiation oncology.  Tobacco abuse-quit smoking about 3 months ago.  Plan: PET/CT for staging and to guide initial diagnostic work-up CT-guided needle biopsy left apical mass Referral to [place] to see oncology and radiation oncology Referral to neurosurgery for question of need for spinal stabilization   [name], MD [facility] [phone]

## 2019-12-23 ENCOUNTER — Encounter: Payer: Self-pay | Admitting: *Deleted

## 2019-12-23 ENCOUNTER — Telehealth: Payer: Self-pay | Admitting: *Deleted

## 2019-12-23 DIAGNOSIS — R918 Other nonspecific abnormal finding of lung field: Secondary | ICD-10-CM

## 2019-12-23 DIAGNOSIS — C3400 Malignant neoplasm of unspecified main bronchus: Secondary | ICD-10-CM

## 2019-12-23 HISTORY — DX: Malignant neoplasm of unspecified main bronchus: C34.00

## 2019-12-23 NOTE — Telephone Encounter (Signed)
I received referral on Gregory Bautista.  I called but was unable to reach. I did leave vm message with my name and phone number to call.

## 2019-12-23 NOTE — Progress Notes (Signed)
I received referral on Mr. Bartelt today.  I called but was unable to reach. I did get a call back from his daughter who states he is too sick to answer to answer the phone.  I updated her on his appt to be seen at [facility] next week. She states the patient is in a lot of pain and I looked at his scan, urgent referral to rad onc completed.  Mr. Quinney does not have tissue dx but has an order for ct bx from Dr. Roxan Hockey. I will contact radiology to see if they can do his bx soon.  I explained to the daughter.  She was thankful for the help.

## 2019-12-23 NOTE — Telephone Encounter (Signed)
I received a call from patient's daughter.  She was calling me back due to me calling Gregory Bautista.  Apparently, Gregory Bautista is very sick and is unable to answer the phone.  I updated her on appt for [place] next week.  She states patient is in a lot of pain.  I looked at his ct scan and will have referral to rad onc in hopes he can be seen sooner than next week.  I will contact radiology to see if they can get his bx soon as well.

## 2019-12-24 ENCOUNTER — Encounter (HOSPITAL_COMMUNITY): Payer: Self-pay | Admitting: Radiology

## 2019-12-24 ENCOUNTER — Encounter: Payer: Self-pay | Admitting: *Deleted

## 2019-12-24 ENCOUNTER — Encounter: Payer: Self-pay | Admitting: Radiation Oncology

## 2019-12-24 ENCOUNTER — Other Ambulatory Visit: Payer: Self-pay | Admitting: Radiation Therapy

## 2019-12-24 ENCOUNTER — Other Ambulatory Visit: Payer: Self-pay

## 2019-12-24 ENCOUNTER — Ambulatory Visit
Admission: RE | Admit: 2019-12-24 | Discharge: 2019-12-24 | Disposition: A | Discharge status: Home / Self Care | Payer: Medicare Other | Source: Ambulatory Visit | Attending: Radiation Oncology | Admitting: Radiation Oncology

## 2019-12-24 DIAGNOSIS — Z7982 Long term (current) use of aspirin: Secondary | ICD-10-CM | POA: Diagnosis not present

## 2019-12-24 DIAGNOSIS — Z7951 Long term (current) use of inhaled steroids: Secondary | ICD-10-CM | POA: Insufficient documentation

## 2019-12-24 DIAGNOSIS — R918 Other nonspecific abnormal finding of lung field: Secondary | ICD-10-CM

## 2019-12-24 DIAGNOSIS — Z87891 Personal history of nicotine dependence: Secondary | ICD-10-CM | POA: Insufficient documentation

## 2019-12-24 DIAGNOSIS — J439 Emphysema, unspecified: Secondary | ICD-10-CM | POA: Insufficient documentation

## 2019-12-24 DIAGNOSIS — Z79899 Other long term (current) drug therapy: Secondary | ICD-10-CM | POA: Insufficient documentation

## 2019-12-24 DIAGNOSIS — I700 Atherosclerosis of aorta: Secondary | ICD-10-CM | POA: Insufficient documentation

## 2019-12-24 NOTE — Progress Notes (Signed)
Dagoberto Reef Male, 79 y.o., 05/13/1940 MRN:  [id number] Phone:  [phone] Jerilynn Mages) PCP:  [name], MD Coverage:  [facility] ([facility]) 12/30/2019 at 11:00 AM BX Approval Received: Today Criselda Peaches, MD  Garth Bigness D I spoke with Dr. Merilynn Finland about this patient today after thoracic tumor conference. He has a large left apical pancoast tumor with bone invasion and intractable pain.   Please schedule for CT bx at [facility] or [facility] ASAP. Bx before PET approved by me due to intractable pain and relative safety of bx in this location.   Thanks,   HKM

## 2019-12-24 NOTE — Progress Notes (Signed)
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 Walker? Wheelchair?: cane at times  SAFETY ISSUES:  Prior radiation? no  Pacemaker/ICD? no  Possible current pregnancy? male  Is the patient on methotrexate? no  Current Complaints / other details: Patient in for consult for probable lung cancer with increasing pain to left chest. Questions and concerns addressed.

## 2019-12-24 NOTE — Progress Notes (Signed)
Radiation Oncology         [phone] ________________________________  Initial Outpatient Consultation  Name: Gregory Bautista MRN: [id number]  Date: 12/24/2019  DOB: 1940-01-30  ZD:GLOVFIE, Christean Grief, MD  [name], *   REFERRING PHYSICIAN: [name], *  DIAGNOSIS: The encounter diagnosis was Mass of upper lobe of left lung.  Clinical stage IIIA (T4, N0) left upper lobe lesion  HISTORY OF PRESENT ILLNESS::Gregory Bautista is a 79 y.o. male who is seen as a courtesy of Dr. Roxan Hockey for an opinion concerning radiation therapy as part of management for his recently diagnosed what appears to be locally advanced left upper lung cancer. Today, he is accompanied by his brother. The patient presented to the ED on 12/04/2019 with complaints of chest pain and shortness of breath. 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 Chest CT scan showed a left upper lobe pleural based mass/malignancy extending into the upper mediastinum and upper thoracic spine.  The patient was seen by Dr. Roxan Hockey on 12/22/2019, during which time it was recommended that he proceed with diagnosis and staging via PET/CT and CT-guided needle biopsy. Unfortunately, the tumor was not considered surgically resectable given the extensive spinal involvement. Thus, he will need to be treated with chemotherapy and radiation.  The patient is scheduled to see Dr. Newman Pies tomorrow with neurosurgery to see if patient may require spine stabilization in light of his advanced disease.  PREVIOUS RADIATION THERAPY: No  PAST MEDICAL HISTORY:  Past Medical History:  Diagnosis Date  . Allergic rhinitis    NEC  . 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 Tobacco use disorder 09/24/2019    PAST SURGICAL HISTORY: Past Surgical History:  Procedure Laterality Date  . COLON SURGERY      FAMILY  HISTORY: No family history on file.  SOCIAL HISTORY:  Social History   Tobacco Use  . Smoking status: Former Smoker    Packs/day: 0.50    Years: 60.00    Pack years: 30.00    Types: Cigarettes    Start date: 12/24/1955    Quit date: 09/24/2019    Years since quitting: 0.2  . Smokeless tobacco: Never Used  Substance Use Topics  . Alcohol use: No  . Drug use: No    ALLERGIES:  Allergies  Allergen Reactions  . Penicillins Anaphylaxis    Has patient had a PCN reaction causing immediate rash, facial/tongue/throat swelling, SOB or lightheadedness with hypotension: Yes Has patient had a PCN reaction causing severe rash involving mucus membranes or skin necrosis: No Has patient had a PCN reaction that required hospitalization No Has patient had a PCN reaction occurring within the last 10 years: Yes If all of the above answers are "NO", then may proceed with Cephalosporin use.   . Wellbutrin [Bupropion] Other (See Comments)    Seizures  . Morphine And Related Itching    MEDICATIONS:  Current Outpatient Medications  Medication Sig Dispense Refill  . albuterol (VENTOLIN HFA) 108 (90 Base) MCG/ACT inhaler Inhale 2 puffs into the lungs every 6 (six) hours as needed for wheezing or shortness of breath.    [name] aspirin EC 81 MG tablet Take 81 mg by mouth daily. Swallow whole.    . cholecalciferol (VITAMIN D3) 25 MCG (1000 UNIT) tablet Take 1,000 Units by mouth daily.    . diclofenac Sodium (VOLTAREN) 1 % GEL Apply  1 application topically 4 (four) times daily as needed (pain).     [name] gabapentin (NEURONTIN) 100 MG capsule Take 100 mg by mouth 3 (three) times daily.    [name] ipratropium (ATROVENT) 0.06 % nasal spray Place 2 sprays into both nostrils daily as needed for rhinitis.    . Multiple Vitamin (MULTIVITAMIN WITH MINERALS) TABS tablet Take 1 tablet by mouth daily.    [name] oxyCODONE (OXY IR/ROXICODONE) 5 MG immediate release tablet Take 1 tablet (5 mg total) by mouth every 4 (four) hours as needed  for up to 5 days for severe pain. 20 tablet 0  . SYMBICORT 160-4.5 MCG/ACT inhaler Inhale 2 puffs into the lungs 2 (two) times daily.     . timolol (TIMOPTIC) 0.5 % ophthalmic solution Place 1 drop into both eyes 2 (two) times daily.     . Travoprost, BAK Free, (TRAVATAN) 0.004 % SOLN ophthalmic solution Place 1 drop into the right eye at bedtime.      No current facility-administered medications for this encounter.    REVIEW OF SYSTEMS:  A 10+ POINT REVIEW OF SYSTEMS WAS OBTAINED including neurology, dermatology, psychiatry, cardiac, respiratory, lymph, extremities, GI, GU, musculoskeletal, constitutional, reproductive, HEENT.  Patient reports pain in the left upper chest and scapular region.  He also reports numbness/burning sensation in the left shoulder axillary and left upper arm region.  He denies any weakness in his left arm but has limited abduction in light of his pain.[name]  He denies any difficulties with grip on the left side.  He denies any numbness or tingling in his lower extremities or weakness.  He denies any bowel or bladder incontinence.  He denies any new headaches   PHYSICAL EXAM:  height is 5\' 9"  (1.753 m) and weight is 156 lb 12.8 oz (71.1 kg). His temperature is 98.4 F (36.9 C). His blood pressure is 111/74 and his pulse is 124 (abnormal). His respiration is 18 and oxygen saturation is 98%.   General: Alert and oriented, in no acute distress HEENT: Head is normocephalic. Extraocular movements are intact. Oropharynx is clear, dentures present along the maxillary region Neck: Neck is supple, no palpable cervical or supraclavicular lymphadenopathy. Heart: Regular in rate and rhythm with no murmurs, rubs, or gallops. Chest: Clear to auscultation bilaterally, with no rhonchi, wheezes, or rales. Abdomen: Soft, nontender, nondistended, with no rigidity or guarding. Extremities: No cyanosis or edema. Lymphatics: see Neck Exam Skin: No concerning lesions. Musculoskeletal: symmetric  strength and muscle tone throughout.  Grip strength is good in the left hand Neurologic: Cranial nerves II through XII are grossly intact. No obvious focalities. Speech is fluent. Coordination is intact. Psychiatric: Judgment and insight are intact. Affect is appropriate.   ECOG = 1  0 - Asymptomatic (Fully active, able to carry on all predisease activities without restriction)  1 - Symptomatic but completely ambulatory (Restricted in physically strenuous activity but ambulatory and able to carry out work of a light or sedentary nature. For example, light housework, office work)  2 - Symptomatic, <50% in bed during the day (Ambulatory and capable of all self care but unable to carry out any work activities. Up and about more than 50% of waking hours)  3 - Symptomatic, >50% in bed, but not bedbound (Capable of only limited self-care, confined to bed or chair 50% or more of waking hours)  4 - Bedbound (Completely disabled. Cannot carry on any self-care. Totally confined to bed or chair)  5 - Death   Eustace Pen MM,  Creech RH, Tormey DC, et al. [phone]). "Toxicity and response criteria of the [facility]". [place] Oncol. 5 (6): 649-55  LABORATORY DATA:  Lab Results  Component Value Date   WBC 7.7 12/04/2019   HGB 14.1 12/04/2019   HCT 44.2 12/04/2019   MCV 90.0 12/04/2019   PLT 229 12/04/2019   NEUTROABS 6.7 12/04/2019   Lab Results  Component Value Date   NA 140 12/04/2019   K 4.1 12/04/2019   CL 110 12/04/2019   CO2 22 12/04/2019   GLUCOSE 118 (H) 12/04/2019   CREATININE 1.07 12/04/2019   CALCIUM 10.1 12/04/2019      RADIOGRAPHY: DG Chest 2 View  Result Date: 12/04/2019 CLINICAL DATA:  Hemoptysis, former smoker, hypertension, history breast cancer EXAM: CHEST - 2 VIEW COMPARISON:  03/24/2015 FINDINGS: Normal heart size and pulmonary vascularity. RIGHT apex scarring. New extensive opacity at the medial LEFT upper lobe approximately 9.1 x 5.5 cm in size  with pleuroparenchymal opacity, volume loss in in adjacent LEFT upper lobe, and associated destruction of the posterior LEFT third and fourth ribs and the LEFT transverse process of T3. Findings are compatible with a LEFT upper lobe neoplasm with chest wall and probable mediastinal invasion. Foci of scarring in LEFT mid lung. Underlying emphysematous and chronic bronchitic changes. No pleural effusion or pneumothorax. 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 Findings discussed with Dr. Vanita Panda on 01/03/2020 at 1230 hours. Electronically Signed   By: Lavonia Dana M.D.   On: 12/04/2019 12:32   CT Chest W Contrast  Result Date: 12/04/2019 CLINICAL DATA:  79 year old male with lung nodule. EXAM: CT CHEST WITH CONTRAST TECHNIQUE: Multidetector CT imaging of the chest was performed during intravenous contrast administration. CONTRAST:  36mL OMNIPAQUE IOHEXOL 300 MG/ML  SOLN COMPARISON:  Chest radiograph dated 12/04/2019. FINDINGS: Cardiovascular: There is no cardiomegaly or pericardial effusion. There is mild atherosclerotic calcification of the thoracic aorta. No aneurysmal dilatation or dissection. The central pulmonary arteries are unremarkable. Mediastinum/Nodes: No hilar or mediastinal adenopathy. Multiple small mildly rounded mediastinal nodes. No mediastinal fluid collection. Lungs/Pleura: There is a 7.7 x 5.6 cm left upper lobe pleural based mass extending into the upper mediastinum. The mass abuts the superior aspect of the aortic arch and left posterior wall of the upper esophagus. 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 There is no pleural effusion pneumothorax. The central airways are patent. Upper Abdomen: Subcentimeter left hepatic hypodense focus is too small to characterize. Multiple small bilateral renal hypodense lesions are not characterized. Musculoskeletal: There is erosive  changes of the T2, T3, and T4 vertebra due to infiltration of the left upper lobe mass. There are destructive changes of the posterior left third and fourth ribs. There is probable extension of the mass into left T3-T4 neural foramina and central canal at T3 and T4 level. Further evaluation with MRI without and with contrast recommended. There is compression fracture of T3 with approximately 50% loss of vertebral body height, age indeterminate. IMPRESSION: 1. 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 Further evaluation with MRI without and with contrast recommended. 2. Aortic Atherosclerosis (ICD10-I70.0) and Emphysema (ICD10-J43.9). Electronically Signed   By: Anner Crete M.D.   On: 12/04/2019 16:38      IMPRESSION: Clinical stage IIIA (T4, N0) left upper lobe lesion  Patient would be good candidate for urgent radiation therapy  given his pain severity.  He will seen neurosurgery tomorrow to see if he will require stabilization.  Assuming no spine stabilization is needed the patient will proceed with radiation and radiosensitizing chemotherapy directed at his Pancoast tumor in the left upper chest region.  I discussed proceeding with radiation therapy without tissue diagnosis given his pain severity and tumor location.  Patient however would like to confirm that he has malignancy prior to starting his radiation therapy.  His treatments will therefore began on December 13.  He will proceed with PET scan and CT-guided biopsy next week.  Biopsy results should be available December 10 at the latest December 13.  Patient has Percocet Neurontin and Voltaren cream to help with pain control.  He can only take 2 Percocet per day or he becomes quite sedated and confused.  He may benefit from Decadron but does not wish to start this at this time.  Today, I talked to the patient and bother about the findings and work-up thus far.  We discussed  the natural history of lung cancer and general treatment, highlighting the role of radiotherapy in the management.  We discussed the available radiation techniques, and focused on the details of logistics and delivery.  We reviewed the anticipated acute and late sequelae associated with radiation in this setting.  The patient was encouraged to ask questions that I answered to the best of my ability.  A patient consent form was discussed and signed.  We retained a copy for our records.  The patient would like to proceed with radiation and will be scheduled for CT simulation.  PLAN: Patient is scheduled for CT simulation at 1 tomorrow.  He will see Dr. Arnoldo Morale at 3 PM.  Total time spent in this encounter was 60 minutes which included reviewing the patient's most recent ED visit, chest x-ray, chest CT scan, consultation with Dr. Roxan Hockey, physical examination, and documentation.  ------------------------------------------------  Blair Promise, PhD, MD  This document serves as a record of services personally performed by Gery Pray, MD. It was created on his behalf by Clerance Lav, a trained medical scribe. The creation of this record is based on the scribe's personal observations and the provider's statements to them. This document has been checked and approved by the attending provider.

## 2019-12-24 NOTE — Progress Notes (Signed)
I updated brain and spine navigator on patient status.

## 2019-12-25 ENCOUNTER — Encounter: Payer: Self-pay | Admitting: Radiation Therapy

## 2019-12-25 ENCOUNTER — Other Ambulatory Visit: Payer: Self-pay | Admitting: Neurosurgery

## 2019-12-25 ENCOUNTER — Ambulatory Visit: Payer: Medicare Other

## 2019-12-25 NOTE — Progress Notes (Signed)
Pt was seen in consult by neurosurgeon, Dr. Arnoldo Morale this afternoon. Recommendations are for urgent decompression and stabilization of his upper thoracic spine. A biopsy will be obtained at the time of surgery next week.  The oncology care team has been made aware of this plan. Rad Onc will circle back around for radiation planning after diagnosis has been confirmed and the patient has had the chance to recover.    Mont Dutton R.T.(R)(T) Radiation Special Procedures Navigator

## 2019-12-28 ENCOUNTER — Telehealth: Payer: Self-pay | Admitting: *Deleted

## 2019-12-28 NOTE — Telephone Encounter (Signed)
I called central scheduling to cancel his bx appt. I then called his daughter with another update.

## 2019-12-28 NOTE — Pre-Procedure Instructions (Addendum)
[place] ([place]), [place] - [address] [place] ([place]) [place] [address] Phone: [phone] Fax: [phone]     Your procedure is scheduled on Wednesday December 8th.  Report to [facility] Main Entrance "A" at 1:30 PM, and check in at the Admitting office.  Call this number if you have problems the morning of surgery:  [phone]  Call [phone] if you have any questions prior to your surgery date Monday-Friday 8am-4pm    Remember:  Do not eat or drink after midnight the night before your surgery     Take these medicines the morning of surgery with A SIP OF WATER  gabapentin (NEURONTIN) ipratropium (ATROVENT) SYMBICORT Inhaler timolol (TIMOPTIC) eye drops  If Needed: albuterol (VENTOLIN HFA)  oxyCODONE (OXY IR/ROXICODONE)  As of today, STOP taking any Aspirin (unless otherwise instructed by your surgeon) Aleve, Naproxen, Ibuprofen, Motrin, Advil, Goody's, BC's, all herbal medications, fish oil, and all vitamins.                      Do not wear jewelry, make up, or nail polish            Do not wear lotions, powders, perfumes/colognes, or deodorant.            Do not shave 48 hours prior to surgery.  Men may shave face and neck.            Do not bring valuables to the hospital.            [facility] is not responsible for any belongings or valuables.  Do NOT Smoke (Tobacco/Vaping) or drink Alcohol 24 hours prior to your procedure If you use a CPAP at night, you may bring all equipment for your overnight stay.   Contacts, glasses, dentures or bridgework may not be worn into surgery.      For patients admitted to the hospital, discharge time will be determined by your treatment team.   Patients discharged the day of surgery will not be allowed to drive home, and someone needs to stay with them for 24 hours.    Special instructions:   [place]- Preparing For Surgery  Before surgery, you can play an  important role. Because skin is not sterile, your skin needs to be as free of germs as possible. You can reduce the number of germs on your skin by washing with CHG (chlorahexidine gluconate) Soap before surgery.  CHG is an antiseptic cleaner which kills germs and bonds with the skin to continue killing germs even after washing.    Oral Hygiene is also important to reduce your risk of infection.  Remember - BRUSH YOUR TEETH THE MORNING OF SURGERY WITH YOUR REGULAR TOOTHPASTE  Please do not use if you have an allergy to CHG or antibacterial soaps. If your skin becomes reddened/irritated stop using the CHG.  Do not shave (including legs and underarms) for at least 48 hours prior to first CHG shower. It is OK to shave your face.  Please follow these instructions carefully.   1. Shower the NIGHT BEFORE SURGERY and the MORNING OF SURGERY with CHG Soap.   2. If you chose to wash your hair, wash your hair first as usual with your normal shampoo.  3. After you shampoo, rinse your hair and body thoroughly to remove the shampoo.  4. Use CHG as you would any other liquid soap. You can apply CHG directly to the skin and  wash gently with a scrungie or a clean washcloth.   5. Apply the CHG Soap to your body ONLY FROM THE NECK DOWN.  Do not use on open wounds or open sores. Avoid contact with your eyes, ears, mouth and genitals (private parts). Wash Face and genitals (private parts)  with your normal soap.   6. Wash thoroughly, paying special attention to the area where your surgery will be performed.  7. Thoroughly rinse your body with warm water from the neck down.  8. DO NOT shower/wash with your normal soap after using and rinsing off the CHG Soap.  9. Pat yourself dry with a CLEAN TOWEL.  10. Wear CLEAN PAJAMAS to bed the night before surgery  11. Place CLEAN SHEETS on your bed the night of your first shower and DO NOT SLEEP WITH PETS.   Day of Surgery: Wear Clean/Comfortable clothing the  morning of surgery Do not apply any deodorants/lotions.   Remember to brush your teeth WITH YOUR REGULAR TOOTHPASTE.   Please read over the following fact sheets that you were given.

## 2019-12-28 NOTE — Telephone Encounter (Signed)
I followed up on Gregory Bautista schedule.  He is having surgery on 12/8.  I called his daughter to re-schedule Dr. Worthy Flank appt and PET re-schedule.  I then called his brother with an update but was unable to reach him.  I did leave vm message.

## 2019-12-29 ENCOUNTER — Encounter (HOSPITAL_COMMUNITY): Payer: Self-pay

## 2019-12-29 ENCOUNTER — Other Ambulatory Visit (HOSPITAL_COMMUNITY)
Admission: RE | Admit: 2019-12-29 | Discharge: 2019-12-29 | Disposition: A | Discharge status: Home / Self Care | Payer: Medicare Other | Source: Ambulatory Visit | Attending: Internal Medicine | Admitting: Internal Medicine

## 2019-12-29 ENCOUNTER — Encounter (HOSPITAL_COMMUNITY)
Admission: RE | Admit: 2019-12-29 | Discharge: 2019-12-29 | Disposition: A | Discharge status: Home / Self Care | Payer: Medicare Other | Source: Ambulatory Visit | Attending: Neurosurgery | Admitting: Neurosurgery

## 2019-12-29 ENCOUNTER — Other Ambulatory Visit: Payer: Self-pay

## 2019-12-29 ENCOUNTER — Other Ambulatory Visit: Payer: Self-pay | Admitting: Neurosurgery

## 2019-12-29 DIAGNOSIS — Z01812 Encounter for preprocedural laboratory examination: Secondary | ICD-10-CM | POA: Insufficient documentation

## 2019-12-29 DIAGNOSIS — Z20822 Contact with and (suspected) exposure to covid-19: Secondary | ICD-10-CM | POA: Insufficient documentation

## 2019-12-29 HISTORY — DX: Unspecified asthma, uncomplicated: J45.909

## 2019-12-29 LAB — TYPE AND SCREEN
ABO/RH(D): O POS
Antibody Screen: NEGATIVE

## 2019-12-29 LAB — CBC
HCT: 45.9 % (ref 39.0–52.0)
Hemoglobin: 14.7 g/dL (ref 13.0–17.0)
MCH: 29.4 pg (ref 26.0–34.0)
MCHC: 32 g/dL (ref 30.0–36.0)
MCV: 91.8 fL (ref 80.0–100.0)
Platelets: 199 10*3/uL (ref 150–400)
RBC: 5 MIL/uL (ref 4.22–5.81)
RDW: 13.2 % (ref 11.5–15.5)
WBC: 5.2 10*3/uL (ref 4.0–10.5)
nRBC: 0 % (ref 0.0–0.2)

## 2019-12-29 LAB — SURGICAL PCR SCREEN
MRSA, PCR: NEGATIVE
Staphylococcus aureus: NEGATIVE

## 2019-12-29 LAB — SARS CORONAVIRUS 2 (TAT 6-24 HRS): SARS Coronavirus 2: NEGATIVE

## 2019-12-29 NOTE — Progress Notes (Signed)
PCP - [name] Cardiologist - denies   Chest x-ray - N/A EKG - 01/03/20 Stress Test - denies ECHO - denies Cardiac Cath - denies  Sleep Study - denies  Aspirin Instructions: Patient instructed to hold all Aspirin, NSAID's, herbal medications, fish oil as of today.   COVID TEST-  12/28/19 at at [place]. Pt instructed to remain in their car. Educated on Transport planner until [employer]. Pt given address and map of testing location along with directions from [facility]   Anesthesia review:   Patient denies shortness of breath, fever, cough and chest pain at PAT appointment   All instructions explained to the patient, with a verbal understanding of the material. Patient agrees to go over the instructions while at home for a better understanding. Patient also instructed to self quarantine after being tested for COVID-19. The opportunity to ask questions was provided.

## 2019-12-30 ENCOUNTER — Encounter (HOSPITAL_COMMUNITY): Payer: Self-pay | Admitting: Neurosurgery

## 2019-12-30 ENCOUNTER — Other Ambulatory Visit: Payer: Self-pay

## 2019-12-30 ENCOUNTER — Inpatient Hospital Stay (HOSPITAL_COMMUNITY): Payer: Medicare Other | Admitting: Anesthesiology

## 2019-12-30 ENCOUNTER — Encounter (HOSPITAL_COMMUNITY)
Admission: RE | Disposition: A | Discharge status: Rehabilitation Facility | Payer: Self-pay | Source: Home / Self Care | Attending: Neurosurgery

## 2019-12-30 ENCOUNTER — Ambulatory Visit (HOSPITAL_COMMUNITY): Admission: RE | Admit: 2019-12-30 | Payer: Medicare Other | Source: Ambulatory Visit

## 2019-12-30 ENCOUNTER — Inpatient Hospital Stay (HOSPITAL_COMMUNITY)
Admission: RE | Admit: 2019-12-30 | Discharge: 2020-01-12 | DRG: 457 | Disposition: A | Discharge status: Rehabilitation Facility | Payer: Medicare Other | Attending: Neurosurgery | Admitting: Neurosurgery

## 2019-12-30 ENCOUNTER — Inpatient Hospital Stay (HOSPITAL_COMMUNITY): Payer: Medicare Other

## 2019-12-30 DIAGNOSIS — C7951 Secondary malignant neoplasm of bone: Principal | ICD-10-CM | POA: Diagnosis present

## 2019-12-30 DIAGNOSIS — H409 Unspecified glaucoma: Secondary | ICD-10-CM | POA: Diagnosis present

## 2019-12-30 DIAGNOSIS — N390 Urinary tract infection, site not specified: Secondary | ICD-10-CM | POA: Diagnosis not present

## 2019-12-30 DIAGNOSIS — G9520 Unspecified cord compression: Secondary | ICD-10-CM | POA: Diagnosis present

## 2019-12-30 DIAGNOSIS — E875 Hyperkalemia: Secondary | ICD-10-CM | POA: Diagnosis not present

## 2019-12-30 DIAGNOSIS — M4714 Other spondylosis with myelopathy, thoracic region: Secondary | ICD-10-CM | POA: Diagnosis not present

## 2019-12-30 DIAGNOSIS — Z66 Do not resuscitate: Secondary | ICD-10-CM | POA: Diagnosis not present

## 2019-12-30 DIAGNOSIS — G479 Sleep disorder, unspecified: Secondary | ICD-10-CM | POA: Diagnosis not present

## 2019-12-30 DIAGNOSIS — M8458XA Pathological fracture in neoplastic disease, other specified site, initial encounter for fracture: Secondary | ICD-10-CM | POA: Diagnosis present

## 2019-12-30 DIAGNOSIS — R0902 Hypoxemia: Secondary | ICD-10-CM | POA: Diagnosis not present

## 2019-12-30 DIAGNOSIS — R1312 Dysphagia, oropharyngeal phase: Secondary | ICD-10-CM | POA: Diagnosis not present

## 2019-12-30 DIAGNOSIS — D492 Neoplasm of unspecified behavior of bone, soft tissue, and skin: Secondary | ICD-10-CM | POA: Diagnosis not present

## 2019-12-30 DIAGNOSIS — Z20822 Contact with and (suspected) exposure to covid-19: Secondary | ICD-10-CM | POA: Diagnosis present

## 2019-12-30 DIAGNOSIS — Z4789 Encounter for other orthopedic aftercare: Secondary | ICD-10-CM | POA: Diagnosis not present

## 2019-12-30 DIAGNOSIS — H5461 Unqualified visual loss, right eye, normal vision left eye: Secondary | ICD-10-CM | POA: Diagnosis not present

## 2019-12-30 DIAGNOSIS — R1310 Dysphagia, unspecified: Secondary | ICD-10-CM | POA: Diagnosis not present

## 2019-12-30 DIAGNOSIS — N179 Acute kidney failure, unspecified: Secondary | ICD-10-CM | POA: Diagnosis not present

## 2019-12-30 DIAGNOSIS — B961 Klebsiella pneumoniae [K. pneumoniae] as the cause of diseases classified elsewhere: Secondary | ICD-10-CM | POA: Diagnosis not present

## 2019-12-30 DIAGNOSIS — M4804 Spinal stenosis, thoracic region: Secondary | ICD-10-CM | POA: Diagnosis present

## 2019-12-30 DIAGNOSIS — Z880 Allergy status to penicillin: Secondary | ICD-10-CM

## 2019-12-30 DIAGNOSIS — C3410 Malignant neoplasm of upper lobe, unspecified bronchus or lung: Secondary | ICD-10-CM | POA: Diagnosis not present

## 2019-12-30 DIAGNOSIS — Z87891 Personal history of nicotine dependence: Secondary | ICD-10-CM | POA: Diagnosis not present

## 2019-12-30 DIAGNOSIS — R338 Other retention of urine: Secondary | ICD-10-CM | POA: Diagnosis present

## 2019-12-30 DIAGNOSIS — Z23 Encounter for immunization: Secondary | ICD-10-CM | POA: Diagnosis present

## 2019-12-30 DIAGNOSIS — I10 Essential (primary) hypertension: Secondary | ICD-10-CM | POA: Diagnosis present

## 2019-12-30 DIAGNOSIS — K592 Neurogenic bowel, not elsewhere classified: Secondary | ICD-10-CM | POA: Diagnosis not present

## 2019-12-30 DIAGNOSIS — J45909 Unspecified asthma, uncomplicated: Secondary | ICD-10-CM | POA: Diagnosis present

## 2019-12-30 DIAGNOSIS — C3412 Malignant neoplasm of upper lobe, left bronchus or lung: Secondary | ICD-10-CM | POA: Diagnosis present

## 2019-12-30 DIAGNOSIS — Z888 Allergy status to other drugs, medicaments and biological substances status: Secondary | ICD-10-CM | POA: Diagnosis not present

## 2019-12-30 DIAGNOSIS — Z7951 Long term (current) use of inhaled steroids: Secondary | ICD-10-CM | POA: Diagnosis not present

## 2019-12-30 DIAGNOSIS — Z419 Encounter for procedure for purposes other than remedying health state, unspecified: Secondary | ICD-10-CM

## 2019-12-30 DIAGNOSIS — Z885 Allergy status to narcotic agent status: Secondary | ICD-10-CM | POA: Diagnosis not present

## 2019-12-30 DIAGNOSIS — G992 Myelopathy in diseases classified elsewhere: Secondary | ICD-10-CM | POA: Diagnosis present

## 2019-12-30 DIAGNOSIS — M48061 Spinal stenosis, lumbar region without neurogenic claudication: Secondary | ICD-10-CM | POA: Diagnosis present

## 2019-12-30 DIAGNOSIS — Z79899 Other long term (current) drug therapy: Secondary | ICD-10-CM | POA: Diagnosis not present

## 2019-12-30 DIAGNOSIS — D62 Acute posthemorrhagic anemia: Secondary | ICD-10-CM | POA: Diagnosis not present

## 2019-12-30 DIAGNOSIS — N319 Neuromuscular dysfunction of bladder, unspecified: Secondary | ICD-10-CM | POA: Diagnosis not present

## 2019-12-30 DIAGNOSIS — C3400 Malignant neoplasm of unspecified main bronchus: Secondary | ICD-10-CM | POA: Diagnosis not present

## 2019-12-30 DIAGNOSIS — I6389 Other cerebral infarction: Secondary | ICD-10-CM | POA: Diagnosis not present

## 2019-12-30 DIAGNOSIS — Z515 Encounter for palliative care: Secondary | ICD-10-CM | POA: Diagnosis not present

## 2019-12-30 DIAGNOSIS — Z7982 Long term (current) use of aspirin: Secondary | ICD-10-CM

## 2019-12-30 DIAGNOSIS — E43 Unspecified severe protein-calorie malnutrition: Secondary | ICD-10-CM | POA: Diagnosis not present

## 2019-12-30 DIAGNOSIS — H3411 Central retinal artery occlusion, right eye: Secondary | ICD-10-CM | POA: Diagnosis not present

## 2019-12-30 DIAGNOSIS — R000 Tachycardia, unspecified: Secondary | ICD-10-CM | POA: Diagnosis not present

## 2019-12-30 DIAGNOSIS — K5903 Drug induced constipation: Secondary | ICD-10-CM | POA: Diagnosis not present

## 2019-12-30 DIAGNOSIS — K5900 Constipation, unspecified: Secondary | ICD-10-CM | POA: Diagnosis not present

## 2019-12-30 DIAGNOSIS — G8220 Paraplegia, unspecified: Secondary | ICD-10-CM | POA: Diagnosis present

## 2019-12-30 DIAGNOSIS — Z8249 Family history of ischemic heart disease and other diseases of the circulatory system: Secondary | ICD-10-CM

## 2019-12-30 DIAGNOSIS — B964 Proteus (mirabilis) (morganii) as the cause of diseases classified elsewhere: Secondary | ICD-10-CM | POA: Diagnosis not present

## 2019-12-30 DIAGNOSIS — R630 Anorexia: Secondary | ICD-10-CM | POA: Diagnosis not present

## 2019-12-30 DIAGNOSIS — G8918 Other acute postprocedural pain: Secondary | ICD-10-CM | POA: Diagnosis not present

## 2019-12-30 DIAGNOSIS — Z7189 Other specified counseling: Secondary | ICD-10-CM | POA: Diagnosis not present

## 2019-12-30 HISTORY — PX: LAMINECTOMY WITH POSTERIOR LATERAL ARTHRODESIS LEVEL 4: SHX6338

## 2019-12-30 HISTORY — DX: Unspecified glaucoma: H40.9

## 2019-12-30 LAB — ABO/RH: ABO/RH(D): O POS

## 2019-12-30 IMAGING — RF DG C-ARM 1-60 MIN
1 series · 4 of 4 positions shown · non-contrast
Comparison: None.

CLINICAL DATA: T1 through T4 fusion.

EXAM:
DG C-ARM 1-60 MIN; THORACIC SPINE 2 VIEWS
FLUOROSCOPY TIME:  Fluoroscopy Time:  2 minutes and 10 seconds.

[Series 1: run · 4 of 4 slices shown]
[im 1/4]
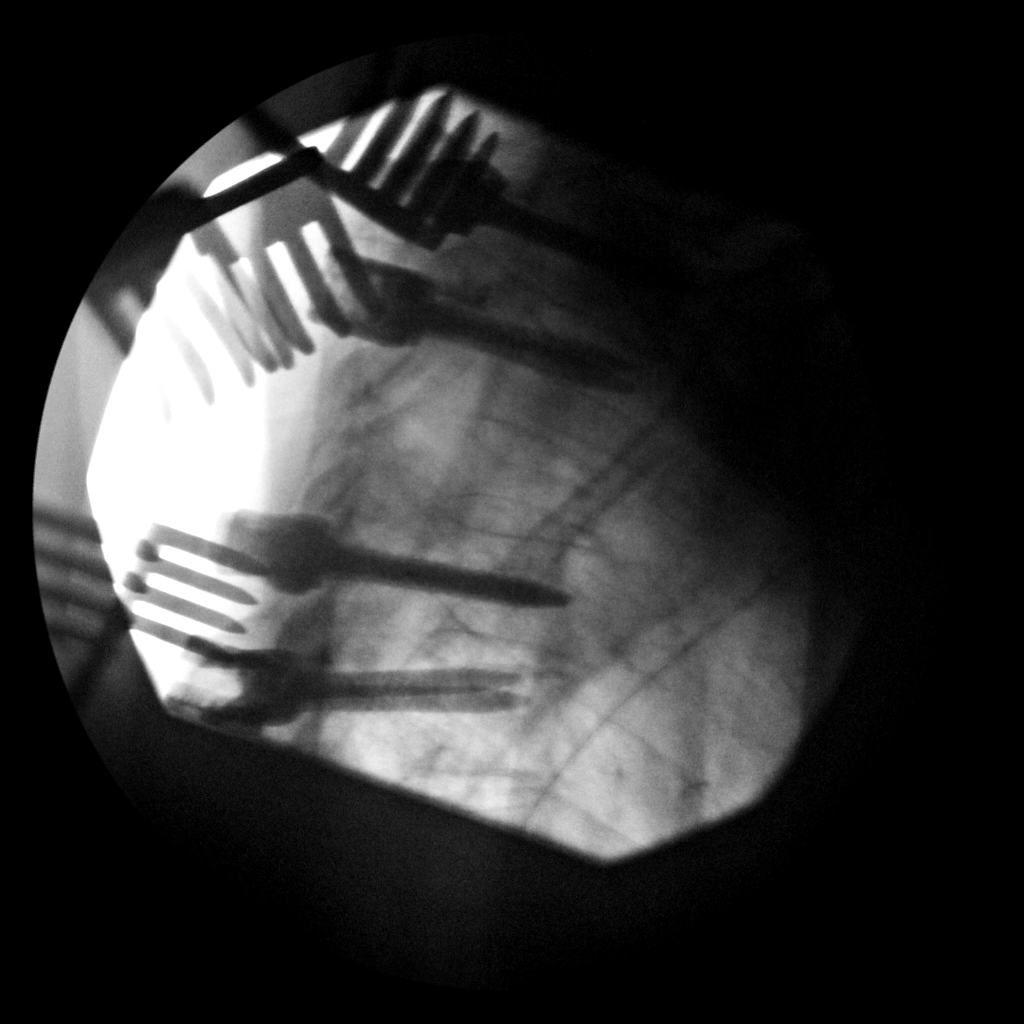
[im 2/4]
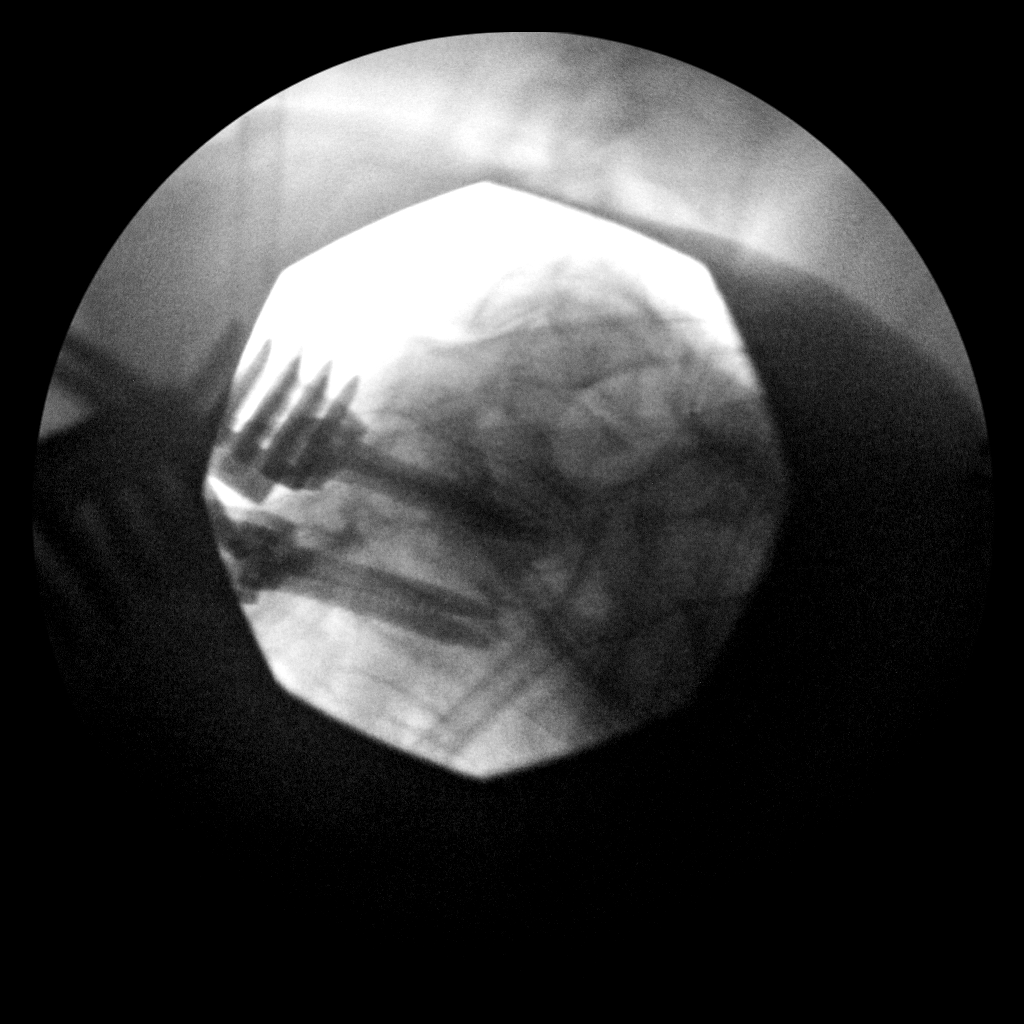
[im 3/4]
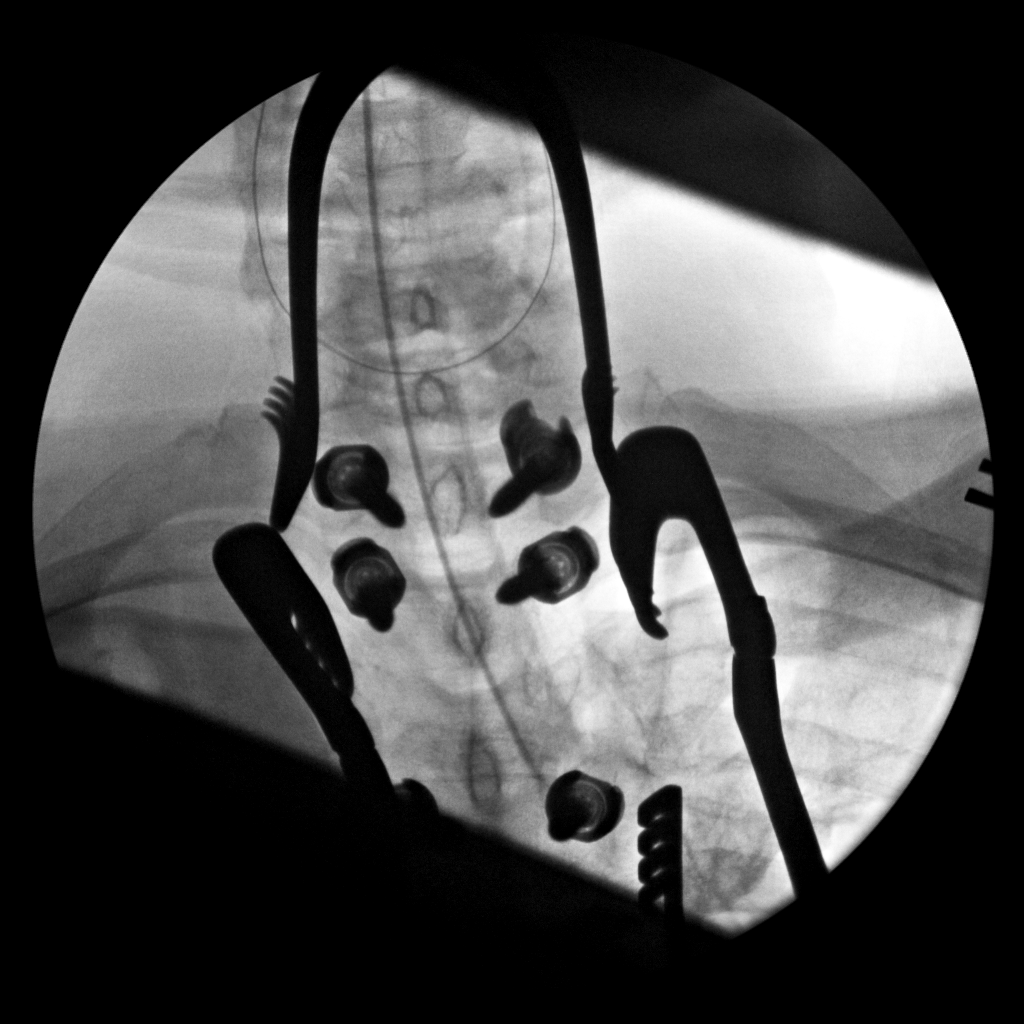
[im 4/4]
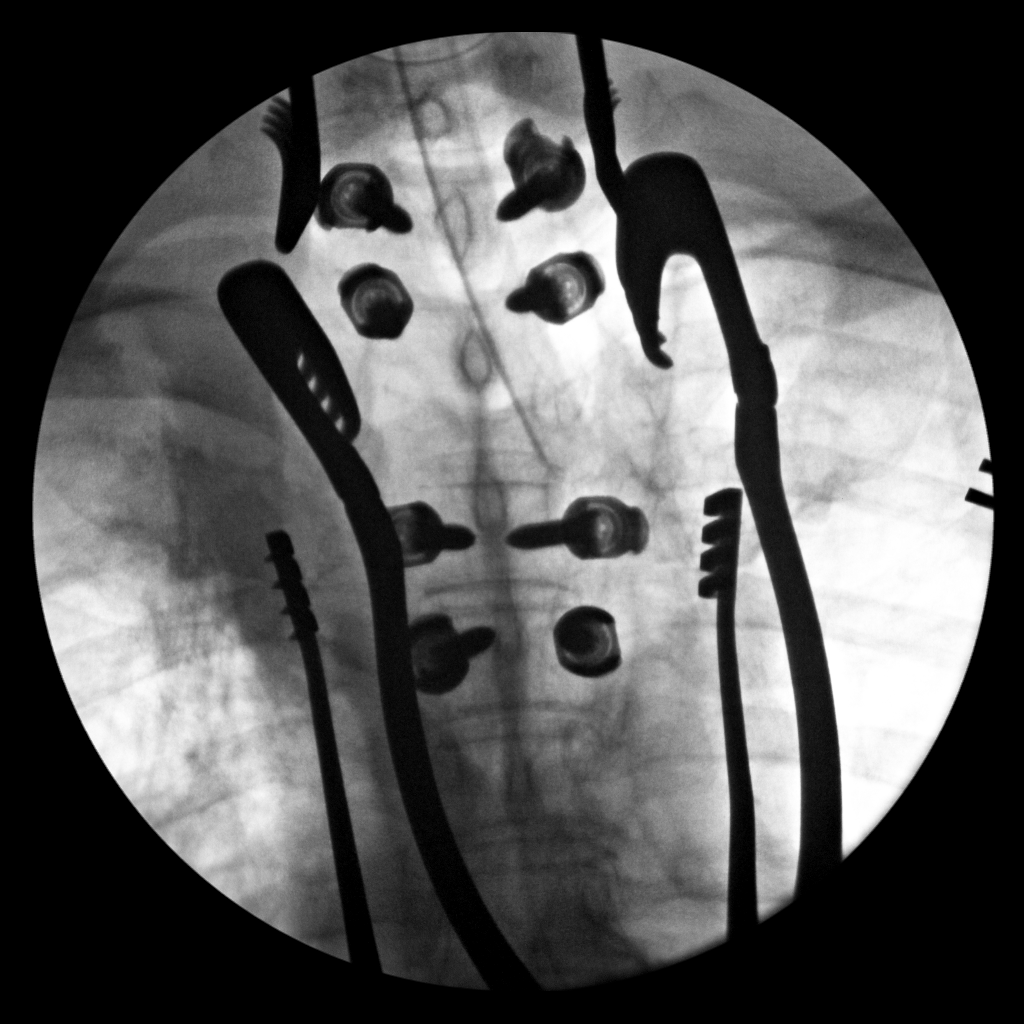

[4 of 4 positions shown; findings below may reference images not displayed]

FINDINGS: Four C-arm fluoroscopic images were obtained intraoperatively and
submitted for post operative interpretation. These images
demonstrate placement of pedicle screws at T1, T2, T4, and T5. There
is limited fluoroscopic visualization. Please see the performing
provider's procedural report for further detail.
IMPRESSION: Intraoperative fluoroscopic imaging, as detailed above.

## 2019-12-30 IMAGING — RF DG THORACIC SPINE 2V
1 series · 4 of 4 positions shown · non-contrast
Comparison: None.

CLINICAL DATA: T1 through T4 fusion.

EXAM:
DG C-ARM 1-60 MIN; THORACIC SPINE 2 VIEWS
FLUOROSCOPY TIME:  Fluoroscopy Time:  2 minutes and 10 seconds.

[Series 1: run · 4 of 4 slices shown]
[im 1/4]
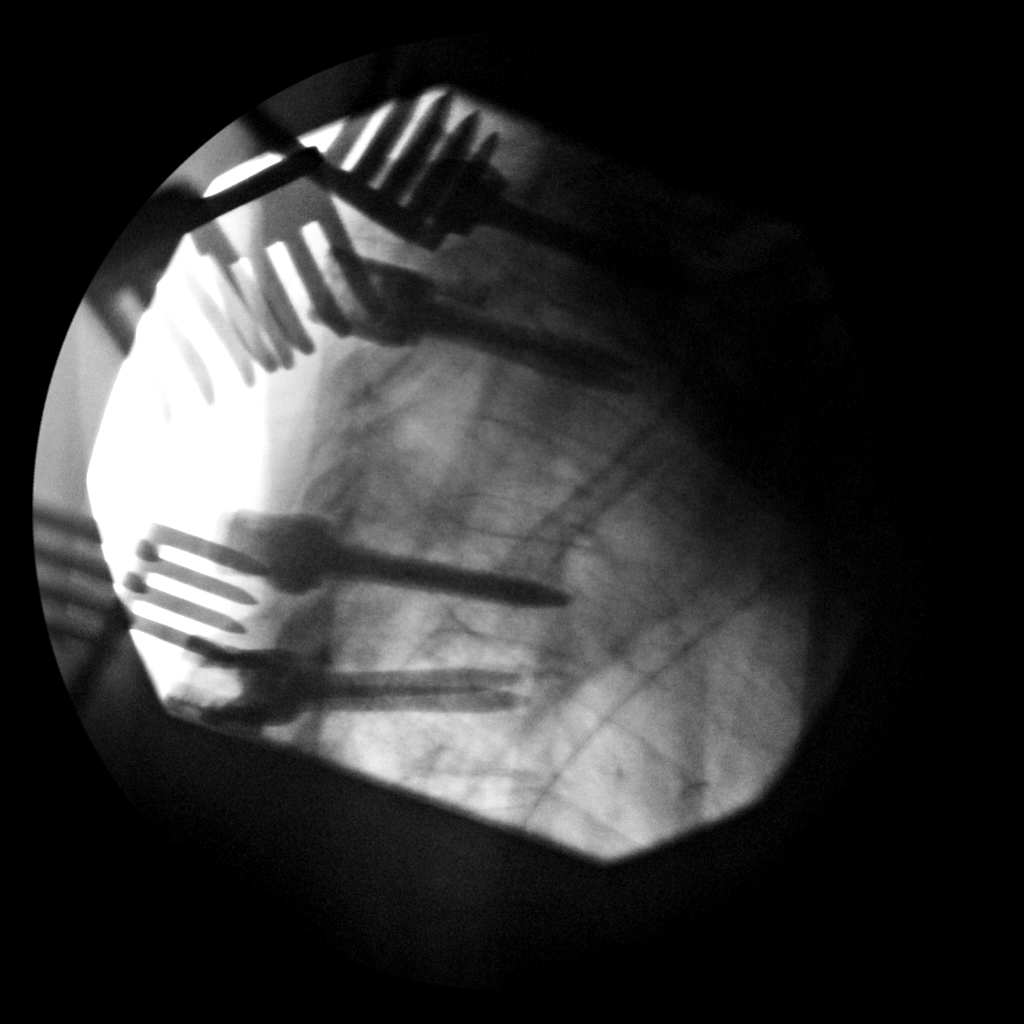
[im 2/4]
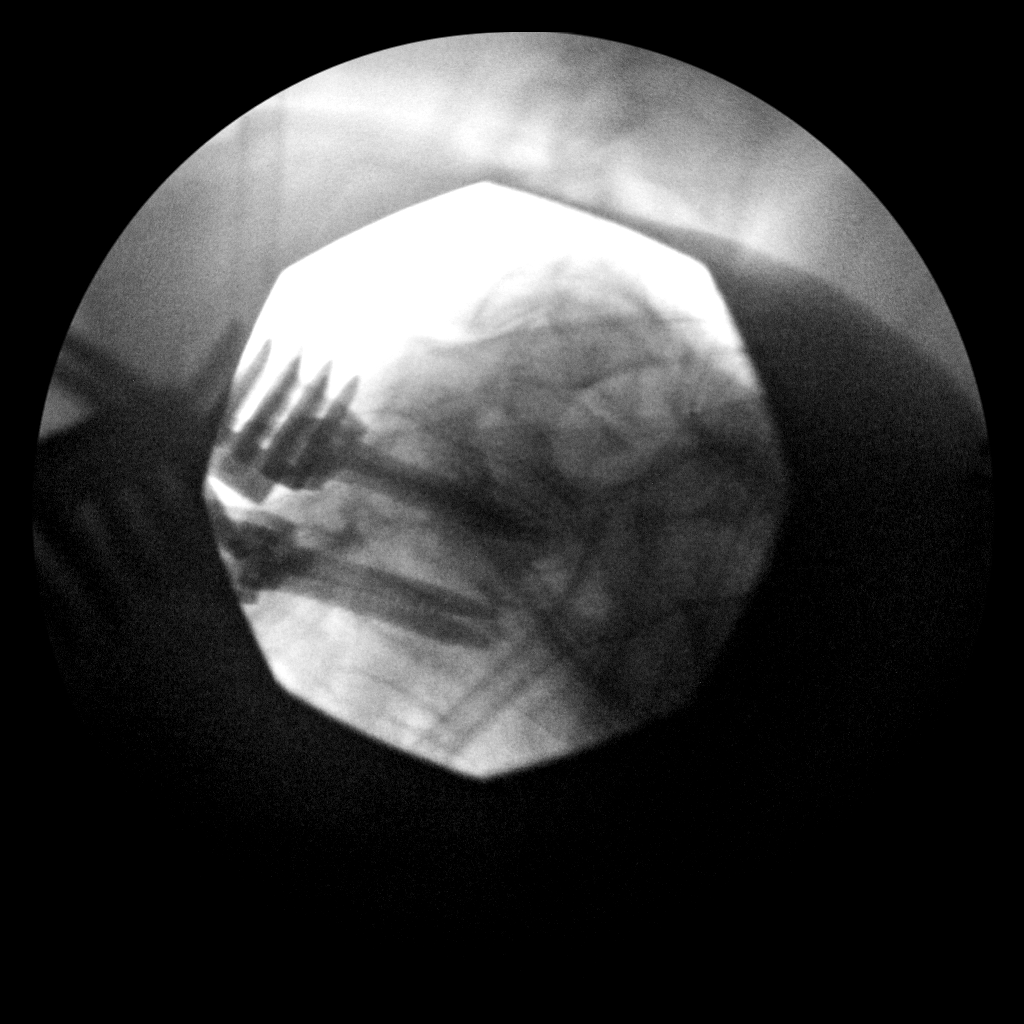
[im 3/4]
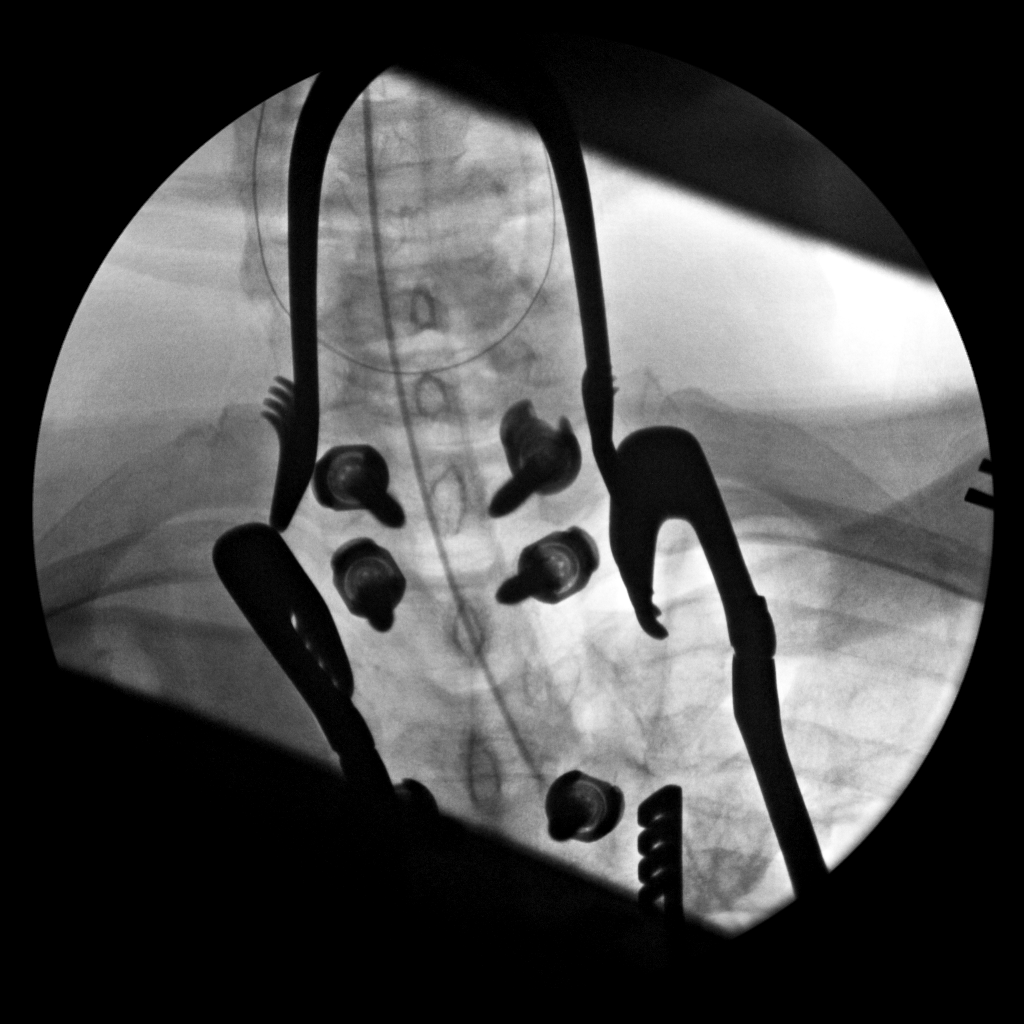
[im 4/4]
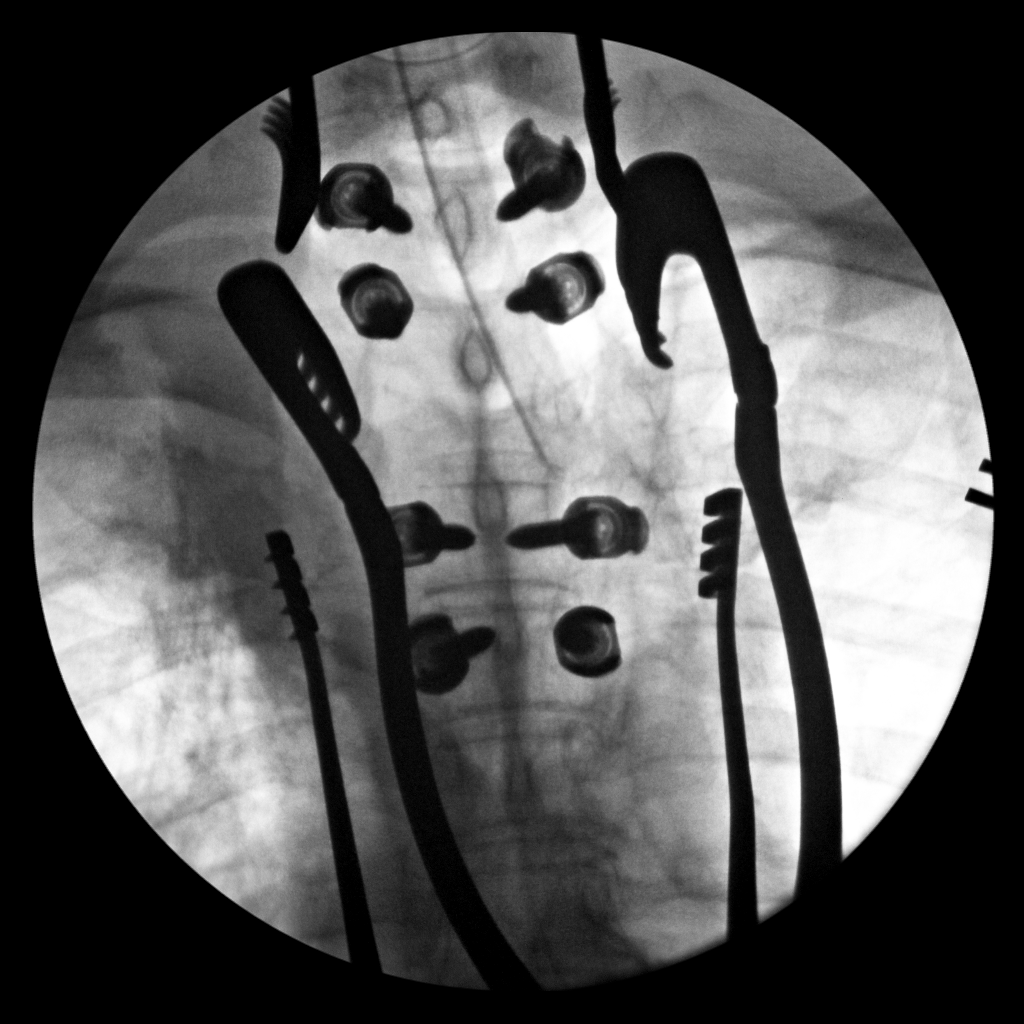

[4 of 4 positions shown; findings below may reference images not displayed]

FINDINGS: Four C-arm fluoroscopic images were obtained intraoperatively and
submitted for post operative interpretation. These images
demonstrate placement of pedicle screws at T1, T2, T4, and T5. There
is limited fluoroscopic visualization. Please see the performing
provider's procedural report for further detail.
IMPRESSION: Intraoperative fluoroscopic imaging, as detailed above.

## 2019-12-30 SURGERY — LAMINECTOMY WITH POSTERIOR LATERAL ARTHRODESIS LEVEL 4
Anesthesia: General | Site: Back

## 2019-12-30 MED ORDER — FENTANYL CITRATE (PF) 250 MCG/5ML IJ SOLN
INTRAMUSCULAR | Status: AC
  Filled 2019-12-30: qty 5

## 2019-12-30 MED ORDER — VANCOMYCIN HCL 1250 MG/250ML IV SOLN
1250.0000 mg | INTRAVENOUS | Status: DC
  Administered 2019-12-31: 1250 mg via INTRAVENOUS
  Filled 2019-12-30: qty 250

## 2019-12-30 MED ORDER — ORAL CARE MOUTH RINSE: 15.0000 mL | Freq: Once | OROMUCOSAL | Status: AC

## 2019-12-30 MED ORDER — LACTATED RINGERS IV SOLN: INTRAVENOUS | Status: DC | PRN

## 2019-12-30 MED ORDER — BUPIVACAINE-EPINEPHRINE (PF) 0.5% -1:200000 IJ SOLN
INTRAMUSCULAR | Status: DC | PRN
  Administered 2019-12-30: 10 mL

## 2019-12-30 MED ORDER — ACETAMINOPHEN 650 MG RE SUPP: 650.0000 mg | RECTAL | Status: DC | PRN

## 2019-12-30 MED ORDER — BUPIVACAINE-EPINEPHRINE 0.5% -1:200000 IJ SOLN
INTRAMUSCULAR | Status: AC
  Filled 2019-12-30: qty 1

## 2019-12-30 MED ORDER — MOMETASONE FURO-FORMOTEROL FUM 200-5 MCG/ACT IN AERO
2.0000 | INHALATION_SPRAY | Freq: Two times a day (BID) | RESPIRATORY_TRACT | Status: DC
  Administered 2019-12-30 – 2020-01-12 (×24): 2 via RESPIRATORY_TRACT
  Filled 2019-12-30: qty 8.8

## 2019-12-30 MED ORDER — PHENYLEPHRINE HCL (PRESSORS) 10 MG/ML IV SOLN
INTRAVENOUS | Status: DC | PRN
  Administered 2019-12-30 (×2): 120 ug via INTRAVENOUS
  Administered 2019-12-30 (×2): 80 ug via INTRAVENOUS

## 2019-12-30 MED ORDER — HYDROMORPHONE HCL 1 MG/ML IJ SOLN
INTRAMUSCULAR | Status: AC
  Filled 2019-12-30: qty 0.5

## 2019-12-30 MED ORDER — TIMOLOL MALEATE 0.5 % OP SOLN
1.0000 [drp] | Freq: Two times a day (BID) | OPHTHALMIC | Status: DC
  Administered 2019-12-30 – 2020-01-12 (×26): 1 [drp] via OPHTHALMIC
  Filled 2019-12-30: qty 5

## 2019-12-30 MED ORDER — PROPOFOL 10 MG/ML IV BOLUS
INTRAVENOUS | Status: DC | PRN
  Administered 2019-12-30: 110 mg via INTRAVENOUS

## 2019-12-30 MED ORDER — LIDOCAINE 2% (20 MG/ML) 5 ML SYRINGE
INTRAMUSCULAR | Status: DC | PRN
  Administered 2019-12-30: 60 mg via INTRAVENOUS

## 2019-12-30 MED ORDER — PHENYLEPHRINE HCL-NACL 10-0.9 MG/250ML-% IV SOLN
INTRAVENOUS | Status: DC | PRN
  Administered 2019-12-30: 50 ug/min via INTRAVENOUS

## 2019-12-30 MED ORDER — ACETAMINOPHEN 500 MG PO TABS
ORAL_TABLET | ORAL | Status: AC
  Filled 2019-12-30: qty 2

## 2019-12-30 MED ORDER — SODIUM CHLORIDE 0.9% FLUSH: 3.0000 mL | INTRAVENOUS | Status: DC | PRN

## 2019-12-30 MED ORDER — 0.9 % SODIUM CHLORIDE (POUR BTL) OPTIME
TOPICAL | Status: DC | PRN
  Administered 2019-12-30: 1000 mL

## 2019-12-30 MED ORDER — MORPHINE SULFATE (PF) 4 MG/ML IV SOLN: 4.0000 mg | INTRAVENOUS | Status: DC | PRN

## 2019-12-30 MED ORDER — FENTANYL CITRATE (PF) 100 MCG/2ML IJ SOLN: 25.0000 ug | INTRAMUSCULAR | Status: DC | PRN

## 2019-12-30 MED ORDER — CYCLOBENZAPRINE HCL 10 MG PO TABS
10.0000 mg | ORAL_TABLET | Freq: Three times a day (TID) | ORAL | Status: DC | PRN
  Administered 2020-01-01 – 2020-01-11 (×8): 10 mg via ORAL
  Filled 2019-12-30 (×8): qty 1

## 2019-12-30 MED ORDER — BUPIVACAINE LIPOSOME 1.3 % IJ SUSP
INTRAMUSCULAR | Status: DC | PRN
  Administered 2019-12-30: 20 mL

## 2019-12-30 MED ORDER — BUPIVACAINE LIPOSOME 1.3 % IJ SUSP
20.0000 mL | Freq: Once | INTRAMUSCULAR | Status: DC
  Filled 2019-12-30: qty 20

## 2019-12-30 MED ORDER — CHLORHEXIDINE GLUCONATE CLOTH 2 % EX PADS: 6.0000 | MEDICATED_PAD | Freq: Once | CUTANEOUS | Status: DC

## 2019-12-30 MED ORDER — ONDANSETRON HCL 4 MG/2ML IJ SOLN: 4.0000 mg | Freq: Four times a day (QID) | INTRAMUSCULAR | Status: DC | PRN

## 2019-12-30 MED ORDER — SUGAMMADEX SODIUM 200 MG/2ML IV SOLN
INTRAVENOUS | Status: DC | PRN
  Administered 2019-12-30: 200 mg via INTRAVENOUS

## 2019-12-30 MED ORDER — ACETAMINOPHEN 500 MG PO TABS
1000.0000 mg | ORAL_TABLET | Freq: Once | ORAL | Status: AC
  Administered 2019-12-30: 1000 mg via ORAL

## 2019-12-30 MED ORDER — PHENOL 1.4 % MT LIQD: 1.0000 | OROMUCOSAL | Status: DC | PRN

## 2019-12-30 MED ORDER — BACITRACIN ZINC 500 UNIT/GM EX OINT
TOPICAL_OINTMENT | CUTANEOUS | Status: DC | PRN
  Administered 2019-12-30: 1 via TOPICAL

## 2019-12-30 MED ORDER — AMISULPRIDE (ANTIEMETIC) 5 MG/2ML IV SOLN: 10.0000 mg | Freq: Once | INTRAVENOUS | Status: DC | PRN

## 2019-12-30 MED ORDER — ADULT MULTIVITAMIN W/MINERALS CH
1.0000 | ORAL_TABLET | Freq: Every day | ORAL | Status: DC
  Administered 2019-12-31 – 2020-01-12 (×13): 1 via ORAL
  Filled 2019-12-30 (×13): qty 1

## 2019-12-30 MED ORDER — MENTHOL 3 MG MT LOZG: 1.0000 | LOZENGE | OROMUCOSAL | Status: DC | PRN

## 2019-12-30 MED ORDER — DEXAMETHASONE SODIUM PHOSPHATE 10 MG/ML IJ SOLN
INTRAMUSCULAR | Status: DC | PRN
  Administered 2019-12-30: 10 mg via INTRAVENOUS

## 2019-12-30 MED ORDER — ONDANSETRON HCL 4 MG/2ML IJ SOLN
INTRAMUSCULAR | Status: DC | PRN
  Administered 2019-12-30: 4 mg via INTRAVENOUS

## 2019-12-30 MED ORDER — ROCURONIUM BROMIDE 10 MG/ML (PF) SYRINGE
PREFILLED_SYRINGE | INTRAVENOUS | Status: DC | PRN
  Administered 2019-12-30 (×2): 50 mg via INTRAVENOUS

## 2019-12-30 MED ORDER — BACITRACIN ZINC 500 UNIT/GM EX OINT
TOPICAL_OINTMENT | CUTANEOUS | Status: AC
  Filled 2019-12-30: qty 28.35

## 2019-12-30 MED ORDER — DOCUSATE SODIUM 100 MG PO CAPS
100.0000 mg | ORAL_CAPSULE | Freq: Two times a day (BID) | ORAL | Status: DC
  Administered 2019-12-30 – 2020-01-12 (×26): 100 mg via ORAL
  Filled 2019-12-30 (×26): qty 1

## 2019-12-30 MED ORDER — ACETAMINOPHEN 500 MG PO TABS
1000.0000 mg | ORAL_TABLET | Freq: Four times a day (QID) | ORAL | Status: AC
  Administered 2019-12-30 – 2019-12-31 (×3): 1000 mg via ORAL
  Filled 2019-12-30 (×4): qty 2

## 2019-12-30 MED ORDER — OXYCODONE HCL 5 MG PO TABS
5.0000 mg | ORAL_TABLET | ORAL | Status: DC | PRN
  Administered 2020-01-02 – 2020-01-11 (×10): 5 mg via ORAL
  Filled 2019-12-30 (×10): qty 1

## 2019-12-30 MED ORDER — VANCOMYCIN HCL IN DEXTROSE 1-5 GM/200ML-% IV SOLN
1000.0000 mg | INTRAVENOUS | Status: AC
  Administered 2019-12-30: 1000 mg via INTRAVENOUS
  Filled 2019-12-30: qty 200

## 2019-12-30 MED ORDER — LABETALOL HCL 5 MG/ML IV SOLN
INTRAVENOUS | Status: AC
  Filled 2019-12-30: qty 4

## 2019-12-30 MED ORDER — FENTANYL CITRATE (PF) 100 MCG/2ML IJ SOLN
INTRAMUSCULAR | Status: DC | PRN
  Administered 2019-12-30: 75 ug via INTRAVENOUS
  Administered 2019-12-30 (×3): 50 ug via INTRAVENOUS
  Administered 2019-12-30: 100 ug via INTRAVENOUS
  Administered 2019-12-30: 25 ug via INTRAVENOUS
  Administered 2019-12-30: 50 ug via INTRAVENOUS
  Administered 2019-12-30 (×2): 25 ug via INTRAVENOUS
  Administered 2019-12-30: 50 ug via INTRAVENOUS

## 2019-12-30 MED ORDER — HYDROMORPHONE HCL 1 MG/ML IJ SOLN
INTRAMUSCULAR | Status: DC | PRN
Start: 2019-12-30 — End: 2019-12-30
  Administered 2019-12-30: .5 mg via INTRAVENOUS

## 2019-12-30 MED ORDER — ESMOLOL HCL 100 MG/10ML IV SOLN
INTRAVENOUS | Status: DC | PRN
  Administered 2019-12-30: 20 mg via INTRAVENOUS

## 2019-12-30 MED ORDER — ACETAMINOPHEN 325 MG PO TABS
650.0000 mg | ORAL_TABLET | ORAL | Status: DC | PRN
  Administered 2020-01-03 – 2020-01-11 (×8): 650 mg via ORAL
  Filled 2019-12-30 (×8): qty 2

## 2019-12-30 MED ORDER — GABAPENTIN 100 MG PO CAPS
100.0000 mg | ORAL_CAPSULE | Freq: Three times a day (TID) | ORAL | Status: DC
  Administered 2019-12-30 – 2020-01-12 (×36): 100 mg via ORAL
  Filled 2019-12-30 (×36): qty 1

## 2019-12-30 MED ORDER — PROPOFOL 10 MG/ML IV BOLUS
INTRAVENOUS | Status: AC
  Filled 2019-12-30: qty 20

## 2019-12-30 MED ORDER — THROMBIN 5000 UNITS EX SOLR: OROMUCOSAL | Status: DC | PRN

## 2019-12-30 MED ORDER — ALBUTEROL SULFATE HFA 108 (90 BASE) MCG/ACT IN AERS: 2.0000 | INHALATION_SPRAY | Freq: Four times a day (QID) | RESPIRATORY_TRACT | Status: DC | PRN

## 2019-12-30 MED ORDER — IPRATROPIUM BROMIDE 0.06 % NA SOLN
2.0000 | Freq: Three times a day (TID) | NASAL | Status: DC
  Administered 2019-12-30 – 2020-01-12 (×31): 2 via NASAL
  Filled 2019-12-30: qty 15

## 2019-12-30 MED ORDER — ONDANSETRON HCL 4 MG PO TABS: 4.0000 mg | ORAL_TABLET | Freq: Four times a day (QID) | ORAL | Status: DC | PRN

## 2019-12-30 MED ORDER — LATANOPROST 0.005 % OP SOLN
1.0000 [drp] | Freq: Every day | OPHTHALMIC | Status: DC
  Administered 2019-12-30 – 2020-01-11 (×13): 1 [drp] via OPHTHALMIC
  Filled 2019-12-30 (×2): qty 2.5

## 2019-12-30 MED ORDER — CHLORHEXIDINE GLUCONATE 0.12 % MT SOLN
15.0000 mL | Freq: Once | OROMUCOSAL | Status: AC
  Administered 2019-12-30: 15 mL via OROMUCOSAL
  Filled 2019-12-30: qty 15

## 2019-12-30 MED ORDER — THROMBIN 5000 UNITS EX SOLR
CUTANEOUS | Status: AC
  Filled 2019-12-30: qty 5000

## 2019-12-30 MED ORDER — BISACODYL 10 MG RE SUPP
10.0000 mg | Freq: Every day | RECTAL | Status: DC | PRN
  Filled 2019-12-30 (×2): qty 1

## 2019-12-30 MED ORDER — SODIUM CHLORIDE 0.9 % IV SOLN: 250.0000 mL | INTRAVENOUS | Status: DC

## 2019-12-30 MED ORDER — LACTATED RINGERS IV SOLN: INTRAVENOUS | Status: DC

## 2019-12-30 MED ORDER — OXYCODONE HCL 5 MG PO TABS
10.0000 mg | ORAL_TABLET | ORAL | Status: DC | PRN
  Administered 2020-01-01 – 2020-01-08 (×3): 10 mg via ORAL
  Filled 2019-12-30 (×3): qty 2

## 2019-12-30 MED ORDER — SODIUM CHLORIDE 0.9% FLUSH
3.0000 mL | Freq: Two times a day (BID) | INTRAVENOUS | Status: DC
  Administered 2019-12-30 – 2020-01-12 (×26): 3 mL via INTRAVENOUS

## 2019-12-30 SURGICAL SUPPLY — 71 items
APL SKNCLS STERI-STRIP NONHPOA (GAUZE/BANDAGES/DRESSINGS) ×1
BENZOIN TINCTURE PRP APPL 2/3 (GAUZE/BANDAGES/DRESSINGS) ×2 IMPLANT
BIT DRILL NEURO 2X3.1 SFT TUCH (MISCELLANEOUS) ×1 IMPLANT
BLADE CLIPPER SURG (BLADE) IMPLANT
BUR MATCHSTICK NEURO 3.0 LAGG (BURR) ×2 IMPLANT
BUR PRECISION FLUTE 6.0 (BURR) ×2 IMPLANT
CANISTER SUCT 3000ML PPV (MISCELLANEOUS) ×2 IMPLANT
CAP LOCKING THREADED (Cap) ×16 IMPLANT
CARTRIDGE OIL MAESTRO DRILL (MISCELLANEOUS) ×1 IMPLANT
CNTNR URN SCR LID CUP LEK RST (MISCELLANEOUS) ×1 IMPLANT
CONNECTOR CROSS 5.5 30.5-43MM (Connector) ×2 IMPLANT
CONT SPEC 4OZ STRL OR WHT (MISCELLANEOUS) ×2
COVER BACK TABLE 60X90IN (DRAPES) ×2 IMPLANT
COVER WAND RF STERILE (DRAPES) IMPLANT
DECANTER SPIKE VIAL GLASS SM (MISCELLANEOUS) ×2 IMPLANT
DIFFUSER DRILL AIR PNEUMATIC (MISCELLANEOUS) ×2 IMPLANT
DRAPE C-ARM 42X72 X-RAY (DRAPES) ×4 IMPLANT
DRAPE HALF SHEET 40X57 (DRAPES) ×2 IMPLANT
DRAPE LAPAROTOMY 100X72X124 (DRAPES) ×2 IMPLANT
DRAPE SURG 17X23 STRL (DRAPES) ×8 IMPLANT
DRILL NEURO 2X3.1 SOFT TOUCH (MISCELLANEOUS) ×2
DRSG OPSITE POSTOP 4X6 (GAUZE/BANDAGES/DRESSINGS) IMPLANT
DRSG OPSITE POSTOP 4X8 (GAUZE/BANDAGES/DRESSINGS) ×2 IMPLANT
ELECT BLADE 4.0 EZ CLEAN MEGAD (MISCELLANEOUS) ×2
ELECT REM PT RETURN 9FT ADLT (ELECTROSURGICAL) ×2
ELECTRODE BLDE 4.0 EZ CLN MEGD (MISCELLANEOUS) ×1 IMPLANT
ELECTRODE REM PT RTRN 9FT ADLT (ELECTROSURGICAL) ×1 IMPLANT
EVACUATOR 1/8 PVC DRAIN (DRAIN) ×2 IMPLANT
GAUZE 4X4 16PLY RFD (DISPOSABLE) IMPLANT
GLOVE BIO SURGEON STRL SZ 6.5 (GLOVE) ×2 IMPLANT
GLOVE BIO SURGEON STRL SZ8 (GLOVE) ×2 IMPLANT
GLOVE BIO SURGEON STRL SZ8.5 (GLOVE) ×2 IMPLANT
GLOVE BIOGEL PI IND STRL 7.5 (GLOVE) ×1 IMPLANT
GLOVE BIOGEL PI INDICATOR 7.5 (GLOVE) ×1
GLOVE EXAM NITRILE XL STR (GLOVE) IMPLANT
GLOVE INDICATOR 6.5 STRL GRN (GLOVE) ×2 IMPLANT
GLOVE SURG SS PI 7.5 STRL IVOR (GLOVE) ×12 IMPLANT
GOWN STRL REUS W/ TWL LRG LVL3 (GOWN DISPOSABLE) ×1 IMPLANT
GOWN STRL REUS W/ TWL XL LVL3 (GOWN DISPOSABLE) ×3 IMPLANT
GOWN STRL REUS W/TWL 2XL LVL3 (GOWN DISPOSABLE) IMPLANT
GOWN STRL REUS W/TWL LRG LVL3 (GOWN DISPOSABLE) ×2
GOWN STRL REUS W/TWL XL LVL3 (GOWN DISPOSABLE) ×6
HEMOSTAT POWDER KIT SURGIFOAM (HEMOSTASIS) ×2 IMPLANT
KIT BASIN OR (CUSTOM PROCEDURE TRAY) ×2 IMPLANT
KIT TURNOVER KIT B (KITS) ×2 IMPLANT
MILL MEDIUM DISP (BLADE) IMPLANT
NEEDLE HYPO 21X1.5 SAFETY (NEEDLE) ×2 IMPLANT
NEEDLE HYPO 22GX1.5 SAFETY (NEEDLE) ×2 IMPLANT
NS IRRIG 1000ML POUR BTL (IV SOLUTION) ×2 IMPLANT
OIL CARTRIDGE MAESTRO DRILL (MISCELLANEOUS) ×2
PACK LAMINECTOMY NEURO (CUSTOM PROCEDURE TRAY) ×2 IMPLANT
PAD ARMBOARD 7.5X6 YLW CONV (MISCELLANEOUS) ×6 IMPLANT
PATTIES SURGICAL .5 X1 (DISPOSABLE) IMPLANT
PUTTY DBM 10CC CALC GRAN (Putty) ×2 IMPLANT
ROD CREO 125MM SPINAL (Rod) ×4 IMPLANT
SCREW 5.5X35MM (Screw) ×8 IMPLANT
SCREW BN 35X5.5XPA (Screw) ×4 IMPLANT
SCREW SPINE 40X5.5XPA CREO (Screw) ×4 IMPLANT
SCREW SPINE CREO 5.5X40 (Screw) ×8 IMPLANT
SPONGE LAP 4X18 RFD (DISPOSABLE) IMPLANT
SPONGE NEURO XRAY DETECT 1X3 (DISPOSABLE) IMPLANT
SPONGE SURGIFOAM ABS GEL 100 (HEMOSTASIS) IMPLANT
STRIP CLOSURE SKIN 1/2X4 (GAUZE/BANDAGES/DRESSINGS) ×2 IMPLANT
SUT VIC AB 1 CT1 18XBRD ANBCTR (SUTURE) ×2 IMPLANT
SUT VIC AB 1 CT1 8-18 (SUTURE) ×4
SUT VIC AB 2-0 CP2 18 (SUTURE) ×6 IMPLANT
SYR 20ML LL LF (SYRINGE) ×2 IMPLANT
TOWEL GREEN STERILE (TOWEL DISPOSABLE) ×2 IMPLANT
TOWEL GREEN STERILE FF (TOWEL DISPOSABLE) ×2 IMPLANT
TRAY FOLEY MTR SLVR 16FR STAT (SET/KITS/TRAYS/PACK) ×2 IMPLANT
WATER STERILE IRR 1000ML POUR (IV SOLUTION) ×2 IMPLANT

## 2019-12-30 NOTE — Anesthesia Procedure Notes (Signed)
Procedure Name: Intubation Date/Time: 12/30/2019 3:36 PM Performed by: Clearnce Sorrel, CRNA Pre-anesthesia Checklist: Patient identified, Emergency Drugs available, Suction available, Patient being monitored and Timeout performed Patient Re-evaluated:Patient Re-evaluated prior to induction Oxygen Delivery Method: Circle system utilized Preoxygenation: Pre-oxygenation with 100% oxygen Induction Type: IV induction Ventilation: Mask ventilation without difficulty Laryngoscope Size: Mac and 3 Grade View: Grade I Tube type: Oral Tube size: 7.5 mm Number of attempts: 1 Airway Equipment and Method: Stylet Placement Confirmation: ETT inserted through vocal cords under direct vision,  positive ETCO2 and breath sounds checked- equal and bilateral Secured at: 23 cm Tube secured with: Tape Dental Injury: Teeth and Oropharynx as per pre-operative assessment

## 2019-12-30 NOTE — Progress Notes (Signed)
Pharmacy Antibiotic Note  Gregory Bautista is a 79 y.o. male admitted on 12/30/2019 with surgical prophylaxis.  Pharmacy has been consulted for Vancomycin dosing. Pt has drain in place.  Vancomycin 1gm pre-op ~1445  Plan: Vancomycin 1250 mg IV q24h Will f/u renal function and for removal of drain  Height: 5\' 9"  (175.3 cm) Weight: 72.1 kg (159 lb) IBW/kg (Calculated) : 70.7  Temp (24hrs), Avg:98 F (36.7 C), Min:97.8 F (36.6 C), Max:98.3 F (36.8 C)  Recent Labs  Lab 12/29/19 1104  WBC 5.2    CrCl cannot be calculated (Patient's most recent lab result is older than the maximum 21 days allowed.).    Allergies  Allergen Reactions  . Penicillins Anaphylaxis    Has patient had a PCN reaction causing immediate rash, facial/tongue/throat swelling, SOB or lightheadedness with hypotension: Yes Has patient had a PCN reaction causing severe rash involving mucus membranes or skin necrosis: No Has patient had a PCN reaction that required hospitalization No Has patient had a PCN reaction occurring within the last 10 years: Yes If all of the above answers are "NO", then may proceed with Cephalosporin use.   . Wellbutrin [Bupropion] Other (See Comments)    Seizures  . Morphine And Related Itching    Antimicrobials this admission: 12/8 Vanc >>   Thank you for allowing pharmacy to be a part of this patient's care.  Sherlon Handing, PharmD, BCPS Please see amion for complete clinical pharmacist phone list 12/30/2019 10:46 PM

## 2019-12-30 NOTE — Progress Notes (Signed)
Subjective: The patient is alert and pleasant.  He is in no apparent distress.  He looks well.  Objective: Vital signs in last 24 hours: Temp:  [97.8 F (36.6 C)-98.3 F (36.8 C)] 98.3 F (36.8 C) (12/08 1945) Pulse Rate:  [107-112] 107 (12/08 1945) Resp:  [11-19] 11 (12/08 1945) BP: (108-124)/(75-86) 124/86 (12/08 1945) SpO2:  [92 %-95 %] 92 % (12/08 1945) Weight:  [72.1 kg] 72.1 kg (12/08 1324) Estimated body mass index is 23.48 kg/m as calculated from the following:   Height as of this encounter: 5\' 9"  (1.753 m).   Weight as of this encounter: 72.1 kg.   Intake/Output from previous day: No intake/output data recorded. Intake/Output this shift: Total I/O In: -  Out: 100 [Urine:50; Blood:50]  Physical exam the patient is alert and pleasant.  His lower extremity strength is grossly normal.  Lab Results: Recent Labs    12/29/19 1104  WBC 5.2  HGB 14.7  HCT 45.9  PLT 199   BMET No results for input(s): NA, K, CL, CO2, GLUCOSE, BUN, CREATININE, CALCIUM in the last 72 hours.  Studies/Results: No results found.  Assessment/Plan: The patient is doing well.  I spoke with his brother.  LOS: 0 days     Gregory Bautista 12/30/2019, 8:00 PM

## 2019-12-30 NOTE — Op Note (Signed)
Brief history: The patient is a 79 year old black male who complained of left chest pain thoracic spine pain, lower extremity weakness, was diagnosed with a left Pancoast tumor.  In the process of the work-up a cervical MRI demonstrated a T3 metastasis with significant spinal cord compression.  I discussed the various treatment options.  He has decided proceed with surgery after weighing the risk, benefits and alternatives.  Pre-op diagnosis: T3 pathologic compression fracture, thoracic spinal stenosis, thoracic myelopathy, paraparesis  Postop diagnosis: The same  Procedure: T2-3 and T3-4 laminectomy for resection of epidural spinal spinal tumor; T1-2, T 2-3, T3-4 and T4-5 posterior arthrodesis with local autograft bone and Zimmer bone graft extender; posterior thoracic instrumentation T1-T5 bilaterally with globus titanium pedicle screws and rods  Surgeon: Dr. Earle Gell  Assistant: Arnetha Massy, NP  Anesthesia: General tracheal  Estimated blood loss: 300 cc  Specimens: Epidural tumor  Drains: 1 medium Hemovac in the epidural space  Complications: None  Description of procedure: The patient was brought to the operating room by the anesthesia team.  General endotracheal anesthesia was induced.  The patient was carefully turned to the prone position on the [place] table.  His arms were tucked by his side.  The patient's thoracic region was then prepared with Betadine scrub and Betadine solution.  Sterile drapes were applied.  I then injected the area to be incised with Marcaine with epinephrine solution.  I did a scalpel to make a linear midline incision from T1-T5.  I used electrocautery to perform a bilateral subperiosteal dissection exposing the spinous process and lamina from T1-T5.  Tumor was evident on the left at T3.  We obtained some specimens.  We inserted the cerebellar and Adson retractors for exposure.  We confirmed our location with intraoperative fluoroscopy.  We began the  decompression by incising the interspinous ligament at T1-2, T2-3 and T3-4.  We used a Leksell rongeur to remove the spinous process at T2 and T3.  I then used a high-speed drill to thin out the lamina at T2-3 and T3-4.  I completed the laminectomy with a Kerrison punches removing the ligamentum flavum at T2-3, T3-4.  I removed the upper aspect of the T4 lamina.  We encountered tumor in the epidural space as expected.  I carefully dissected away from the dura and remove the epidural tumor as much as possible using the pituitary forceps, Kerrison punches, etc. decompressing the thecal sac.  The left T3 pars was quite loose.  I removed it.  We could see the tumor deeper down.  At this point we had a good decompression of the thecal sac from T2-3 and T3-4.  We now turned our attention to the instrumentation.  There was limited visualization using intraoperative fluoroscopy.  I cannulated the bilateral T1, T2, T4 and T5 pedicles with the bone probe under fluoroscopic guidance.  I removed the bone probe and I probed inside the tract with a ball probe to overall cortical breaches.  I then tapped with a 4.5 mm tap and again probe to rule out cortical breaches.  I inserted 5.5 x 35 and 40 mm pedicle screws into the bilateral T1, T2, T4 and T5 pedicles.  We then connected the unilateral pedicle screws with a rod.  We secured the rod in place with the caps.  We tightened the caps appropriately.  We placed a cross connector between the rods and tightened it appropriately completing the instrumentation.  We now turned our attention to the arthrodesis.  I used a  high-speed drill to decorticate the lamina, facet facets, etc. at T1-2, T2-3, T3-4 and T4-5.  I laid Zimmer bone graft center over these decorticated structures completing the posterior lateral arthrodesis at T1-2, T2-3, T3-4 and T4-5.  We then obtained hemostasis using bipolar cautery.  We irrigated the wound out with same solution.  We then placed a medium  Hemovac drain in the epidural space and tunneled it out through a separate stab wound.  We then remove the retractors and reapproximated the patient's thoracic fascia with interrupted #1 Vicryl suture.  We reapproximated the subcutaneous tissue with interrupted 2-0 Vicryl suture.  We reapproximate the skin with Steri-Strips and benzoin.  The wound was then coated with bacitracin ointment.  A sterile dressing was applied.  The drapes were removed.  The patient was then returned to the supine position.  By report all sponge, instrument, and needle counts were correct at the end of this case.

## 2019-12-30 NOTE — H&P (Signed)
Subjective: The patient is a 79 year old black male who presented with left-sided chest pain.  He was worked up with a chest x-ray and chest CT which demonstrated a left Pancoast tumor with erosion into the T3 vertebral body and significant spinal cord compression.  I discussed the various treatment options with him and recommended a thoracic laminectomy and instrumentation and fusion to stabilize the spine.  The patient has decided proceed with surgery.  Past Medical History:  Diagnosis Date  . Allergic rhinitis    NEC  . Asthma   . 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 Tobacco use disorder 09/24/2019    Past Surgical History:  Procedure Laterality Date  . COLON SURGERY    . EYE SURGERY Right     Allergies  Allergen Reactions  . Penicillins Anaphylaxis    Has patient had a PCN reaction causing immediate rash, facial/tongue/throat swelling, SOB or lightheadedness with hypotension: Yes Has patient had a PCN reaction causing severe rash involving mucus membranes or skin necrosis: No Has patient had a PCN reaction that required hospitalization No Has patient had a PCN reaction occurring within the last 10 years: Yes If all of the above answers are "NO", then may proceed with Cephalosporin use.   . Wellbutrin [Bupropion] Other (See Comments)    Seizures  . Morphine And Related Itching    Social History   Tobacco Use  . Smoking status: Former Smoker    Packs/day: 0.50    Years: 60.00    Pack years: 30.00    Types: Cigarettes    Start date: 12/24/1955    Quit date: 09/24/2019    Years since quitting: 0.2  . Smokeless tobacco: Never Used  Substance Use Topics  . Alcohol use: No    History reviewed. No pertinent family history. Prior to Admission medications   Medication Sig Start Date End Date Taking? Authorizing Provider  albuterol (VENTOLIN HFA) 108 (90 Base) MCG/ACT inhaler Inhale 2 puffs into the lungs every 6 (six) hours as needed for wheezing or shortness of breath.    Yes [provider]  aspirin EC 81 MG tablet Take 81 mg by mouth daily. Swallow whole.   Yes [provider]  cholecalciferol (VITAMIN D3) 25 MCG (1000 UNIT) tablet Take 1,000 Units by mouth daily.   Yes [provider]  diclofenac Sodium (VOLTAREN) 1 % GEL Apply 1 application topically 4 (four) times daily as needed (pain).  12/21/19  Yes [provider]  gabapentin (NEURONTIN) 100 MG capsule Take 100 mg by mouth 3 (three) times daily.   Yes [provider]  ipratropium (ATROVENT) 0.06 % nasal spray Place 2 sprays into both nostrils daily as needed for rhinitis.   Yes [provider]  Multiple Vitamin (MULTIVITAMIN WITH MINERALS) TABS tablet Take 1 tablet by mouth daily.   Yes [provider]  SYMBICORT 160-4.5 MCG/ACT inhaler Inhale 1 puff into the lungs 2 (two) times daily as needed (shortness of breath).  10/28/19  Yes [provider]  timolol (TIMOPTIC) 0.5 % ophthalmic solution Place 1 drop into both eyes 2 (two) times daily.  09/25/19  Yes [provider]  Travoprost, BAK Free, (TRAVATAN) 0.004 % SOLN ophthalmic solution Place 1 drop into both eyes at bedtime.  09/25/19  Yes [provider]     Review of Systems  Positive ROS: As above  All other systems have been reviewed and were otherwise negative with the exception of those mentioned in the HPI and as  above.  Objective: Vital signs in last 24 hours: Temp:  [97.8 F (36.6 C)] 97.8 F (36.6 C) (12/08 1324) Pulse Rate:  [112] 112 (12/08 1324) Resp:  [19] 19 (12/08 1324) BP: (108)/(75) 108/75 (12/08 1324) SpO2:  [95 %] 95 % (12/08 1324) Weight:  [72.1 kg] 72.1 kg (12/08 1324) Estimated body mass index is 23.48 kg/m as calculated from the following:   Height as of this encounter: 5\' 9"  (1.753 m).   Weight as of this encounter: 72.1 kg.   General Appearance: Alert Head: Normocephalic, without obvious abnormality, atraumatic Eyes: PERRL,  conjunctiva/corneas clear, EOM's intact,    Ears: Normal  Throat: Normal  Neck: Supple, Back: unremarkable Lungs: Clear to auscultation bilaterally, respirations unlabored Heart: Regular rate and rhythm, no murmur, rub or gallop Abdomen: Soft, non-tender Extremities: Extremities normal, atraumatic, no cyanosis or edema Skin: unremarkable  NEUROLOGIC:   Mental status: alert and oriented,Motor Exam - grossly normal Sensory Exam - grossly normal Reflexes:  Coordination - grossly normal Gait - grossly normal Balance - grossly normal Cranial Nerves: I: smell Not tested  II: visual acuity  OS: Normal  OD: Normal   II: visual fields Full to confrontation  II: pupils Equal, round, reactive to light  III,VII: ptosis None  III,IV,VI: extraocular muscles  Full ROM  V: mastication Normal  V: facial light touch sensation  Normal  V,VII: corneal reflex  Present  VII: facial muscle function - upper  Normal  VII: facial muscle function - lower Normal  VIII: hearing Not tested  IX: soft palate elevation  Normal  IX,X: gag reflex Present  XI: trapezius strength  5/5  XI: sternocleidomastoid strength 5/5  XI: neck flexion strength  5/5  XII: tongue strength  Normal    Data Review Lab Results  Component Value Date   WBC 5.2 12/29/2019   HGB 14.7 12/29/2019   HCT 45.9 12/29/2019   MCV 91.8 12/29/2019   PLT 199 12/29/2019   Lab Results  Component Value Date   NA 140 12/04/2019   K 4.1 12/04/2019   CL 110 12/04/2019   CO2 22 12/04/2019   BUN 20 12/04/2019   CREATININE 1.07 12/04/2019   GLUCOSE 118 (H) 12/04/2019   No results found for: INR, PROTIME  Assessment/Plan: Left Pancoast tumor, T3 pathologic fracture, paraparesis, thoracic spine pain, thoracic myelopathy: I have discussed the situation with the patient and his brother.  I reviewed his image studies with him and pointed out the abnormalities.  We have discussed the various treatment options including doing nothing,  medical management, radiation therapy, and surgery.  I recommended a thoracic laminectomy with posterior instrumentation fusion to decompress the spinal cord and hopefully prevent paralysis while the patient is undergoing chemotherapy and radiation therapy.  I described the surgery to the patient and his brother.  I have shown him surgical models.  We have discussed the risk, benefits, alternatives, expected postoperative course, and likelihood of achieving our goals with surgery.  He understands that the surgery will not cure his cancer.  I have answered all his questions.  He has decided proceed with surgery.   Ophelia Charter 12/30/2019 2:25 PM

## 2019-12-30 NOTE — Anesthesia Postprocedure Evaluation (Signed)
Anesthesia Post Note  Patient: Gregory Bautista  Procedure(s) Performed: THORACIC THREE LAMINECTOMY, THORACIC INSTRUMENTATION AND FUSION THORACIC ONE-FIVE (N/A Back)     Patient location during evaluation: PACU Anesthesia Type: General Level of consciousness: awake and alert Pain management: pain level controlled Vital Signs Assessment: post-procedure vital signs reviewed and stable Respiratory status: spontaneous breathing, nonlabored ventilation, respiratory function stable and patient connected to nasal cannula oxygen Cardiovascular status: blood pressure returned to baseline and stable Postop Assessment: no apparent nausea or vomiting Anesthetic complications: no   No complications documented.  Last Vitals:  Vitals:   12/30/19 2145 12/30/19 2200  BP: 113/79 139/80  Pulse: 94 99  Resp: (!) 8 13  Temp: 36.8 C 36.6 C  SpO2: 98% 99%    Last Pain:  Vitals:   12/30/19 2200  TempSrc: Oral  PainSc: 0-No pain                 Belenda Cruise P Simmie Garin

## 2019-12-30 NOTE — Transfer of Care (Signed)
Immediate Anesthesia Transfer of Care Note  Patient: Gregory Bautista  Procedure(s) Performed: THORACIC THREE LAMINECTOMY, THORACIC INSTRUMENTATION AND FUSION THORACIC ONE-FIVE (N/A Back)  Patient Location: PACU  Anesthesia Type:General  Level of Consciousness: awake, alert  and oriented  Airway & Oxygen Therapy: Patient Spontanous Breathing  Post-op Assessment: Report given to RN and Post -op Vital signs reviewed and stable  Post vital signs: Reviewed and stable  Last Vitals:  Vitals Value Taken Time  BP 124/86 12/30/19 1946  Temp    Pulse 105 12/30/19 1947  Resp 13 12/30/19 1947  SpO2 90 % 12/30/19 1947  Vitals shown include unvalidated device data.  Last Pain:  Vitals:   12/30/19 1342  TempSrc:   PainSc: 5       Patients Stated Pain Goal: 3 (64/40/34 7425)  Complications: No complications documented.

## 2019-12-30 NOTE — Anesthesia Preprocedure Evaluation (Signed)
Anesthesia Evaluation  Patient identified by MRN, date of birth, ID band Patient awake    Reviewed: Allergy & Precautions, NPO status , Patient's Chart, lab work & pertinent test results  Airway Mallampati: II  TM Distance: >3 FB     Dental  (+) Dental Advisory Given   Pulmonary asthma , former smoker,  Lung CA   breath sounds clear to auscultation       Cardiovascular negative cardio ROS   Rhythm:Regular Rate:Normal     Neuro/Psych negative neurological ROS     GI/Hepatic negative GI ROS, Neg liver ROS,   Endo/Other  negative endocrine ROS  Renal/GU negative Renal ROS     Musculoskeletal   Abdominal   Peds  Hematology negative hematology ROS (+)   Anesthesia Other Findings   Reproductive/Obstetrics                             Anesthesia Physical Anesthesia Plan  ASA: III  Anesthesia Plan: General   Post-op Pain Management:    Induction: Intravenous  PONV Risk Score and Plan: 2 and Dexamethasone, Ondansetron and Treatment may vary due to age or medical condition  Airway Management Planned: Oral ETT  Additional Equipment: None  Intra-op Plan:   Post-operative Plan: Extubation in OR  Informed Consent: I have reviewed the patients History and Physical, chart, labs and discussed the procedure including the risks, benefits and alternatives for the proposed anesthesia with the patient or authorized representative who has indicated his/her understanding and acceptance.     Dental advisory given  Plan Discussed with: CRNA  Anesthesia Plan Comments:         Anesthesia Quick Evaluation

## 2019-12-31 ENCOUNTER — Telehealth: Payer: Self-pay | Admitting: Radiation Oncology

## 2019-12-31 ENCOUNTER — Encounter (HOSPITAL_COMMUNITY): Payer: Self-pay | Admitting: Neurosurgery

## 2019-12-31 ENCOUNTER — Ambulatory Visit: Payer: Medicare Other | Admitting: Internal Medicine

## 2019-12-31 ENCOUNTER — Encounter: Payer: Self-pay | Admitting: Radiation Oncology

## 2019-12-31 ENCOUNTER — Other Ambulatory Visit: Payer: Medicare Other

## 2019-12-31 LAB — CBC
HCT: 38.9 % — ABNORMAL LOW (ref 39.0–52.0)
Hemoglobin: 12.3 g/dL — ABNORMAL LOW (ref 13.0–17.0)
MCH: 28.7 pg (ref 26.0–34.0)
MCHC: 31.6 g/dL (ref 30.0–36.0)
MCV: 90.7 fL (ref 80.0–100.0)
Platelets: 197 10*3/uL (ref 150–400)
RBC: 4.29 MIL/uL (ref 4.22–5.81)
RDW: 13.2 % (ref 11.5–15.5)
WBC: 7.5 10*3/uL (ref 4.0–10.5)
nRBC: 0 % (ref 0.0–0.2)

## 2019-12-31 LAB — BASIC METABOLIC PANEL
Anion gap: 10 (ref 5–15)
Anion gap: 11 (ref 5–15)
BUN: 18 mg/dL (ref 8–23)
BUN: 18 mg/dL (ref 8–23)
CO2: 27 mmol/L (ref 22–32)
CO2: 23 mmol/L (ref 22–32)
Calcium: 9.7 mg/dL (ref 8.9–10.3)
Calcium: 9.5 mg/dL (ref 8.9–10.3)
Chloride: 104 mmol/L (ref 98–111)
Chloride: 104 mmol/L (ref 98–111)
Creatinine, Ser: 1.14 mg/dL (ref 0.61–1.24)
Creatinine, Ser: 0.99 mg/dL (ref 0.61–1.24)
GFR, Estimated: >60 mL/min (ref >60)
GFR, Estimated: >60 mL/min (ref >60)
Glucose, Bld: 127 mg/dL — ABNORMAL HIGH (ref 70–99)
Glucose, Bld: 98 mg/dL (ref 70–99)
Potassium: 5.8 mmol/L — ABNORMAL HIGH (ref 3.5–5.1)
Potassium: 4.7 mmol/L (ref 3.5–5.1)
Sodium: 141 mmol/L (ref 135–145)
Sodium: 138 mmol/L (ref 135–145)

## 2019-12-31 MED ORDER — VANCOMYCIN HCL IN DEXTROSE 1-5 GM/200ML-% IV SOLN
1000.0000 mg | INTRAVENOUS | Status: DC
  Administered 2020-01-01: 1000 mg via INTRAVENOUS
  Filled 2019-12-31: qty 200

## 2019-12-31 NOTE — Progress Notes (Addendum)
Nurse responded to chair alarm and found the patient trying to stand and his hemovac had been fully dislodged from his back. No bleeding at the site, dressing reinforced, MD notified.

## 2019-12-31 NOTE — Progress Notes (Signed)
Pharmacy Antibiotic Note  Gregory Bautista is a 79 y.o. male admitted on 12/30/2019 with surgical prophylaxis.  Pharmacy has been consulted for Vancomycin dosing.  Drain is still in place. We will adjust vanc based on his renal function and indication.   Plan: Reduce vancomycin 1000 mg IV q24h Will f/u renal function and for removal of drain  Height: 5\' 9"  (175.3 cm) Weight: 72.1 kg (159 lb) IBW/kg (Calculated) : 70.7  Temp (24hrs), Avg:98.1 F (36.7 C), Min:97.8 F (36.6 C), Max:98.7 F (37.1 C)  Recent Labs  Lab 12/29/19 1104 12/31/19 0404  WBC 5.2 7.5  CREATININE  --  1.14    Estimated Creatinine Clearance: 54.3 mL/min (by C-G formula based on SCr of 1.14 mg/dL).    Allergies  Allergen Reactions  . Penicillins Anaphylaxis    Has patient had a PCN reaction causing immediate rash, facial/tongue/throat swelling, SOB or lightheadedness with hypotension: Yes Has patient had a PCN reaction causing severe rash involving mucus membranes or skin necrosis: No Has patient had a PCN reaction that required hospitalization No Has patient had a PCN reaction occurring within the last 10 years: Yes If all of the above answers are "NO", then may proceed with Cephalosporin use.   . Wellbutrin [Bupropion] Other (See Comments)    Seizures  . Morphine And Related Itching    Antimicrobials this admission: 12/8 Vanc >>   Onnie Boer, PharmD, BCIDP, AAHIVP, CPP Infectious Disease Pharmacist 12/31/2019 8:50 AM

## 2019-12-31 NOTE — Progress Notes (Signed)
Subjective: The patient is alert and pleasant.  He is appropriately sore.  He looks well.  Objective: Vital signs in last 24 hours: Temp:  [97.8 F (36.6 C)-98.7 F (37.1 C)] 98 F (36.7 C) (12/09 0820) Pulse Rate:  [80-112] 80 (12/09 0456) Resp:  [8-19] 15 (12/09 0456) BP: (108-159)/(67-86) 118/74 (12/09 0456) SpO2:  [92 %-99 %] 99 % (12/09 0755) Weight:  [72.1 kg] 72.1 kg (12/08 1324) Estimated body mass index is 23.48 kg/m as calculated from the following:   Height as of this encounter: 5\' 9"  (1.753 m).   Weight as of this encounter: 72.1 kg.   Intake/Output from previous day: 12/08 0701 - 12/09 0700 In: 2240 [P.O.:240; I.V.:2000] Out: 1770 [Urine:1260; Drains:210; Blood:300] Intake/Output this shift: No intake/output data recorded.  Physical exam patient is alert and pleasant.  His lower extremity strength and sensation is normal.  Lab Results: Recent Labs    12/29/19 1104 12/31/19 0404  WBC 5.2 7.5  HGB 14.7 12.3*  HCT 45.9 38.9*  PLT 199 197   BMET Recent Labs    12/31/19 0404  NA 141  K 5.8*  CL 104  CO2 27  GLUCOSE 127*  BUN 18  CREATININE 1.14  CALCIUM 9.7    Studies/Results: DG Thoracic Spine 2 View  Result Date: 12/30/2019 CLINICAL DATA:  T1 through T4 fusion. EXAM: DG C-ARM 1-60 MIN; THORACIC SPINE 2 VIEWS FLUOROSCOPY TIME:  Fluoroscopy Time:  2 minutes and 10 seconds. COMPARISON:  None. FINDINGS: Four C-arm fluoroscopic images were obtained intraoperatively and submitted for post operative interpretation. These images demonstrate placement of pedicle screws at T1, T2, T4, and T5. There is limited fluoroscopic visualization. Please see the performing provider's procedural report for further detail. IMPRESSION: Intraoperative fluoroscopic imaging, as detailed above. Electronically Signed   By: Margaretha Sheffield MD   On: 12/30/2019 20:03   DG C-Arm 1-60 Min  Result Date: 12/30/2019 CLINICAL DATA:  T1 through T4 fusion. EXAM: DG C-ARM 1-60 MIN;  THORACIC SPINE 2 VIEWS FLUOROSCOPY TIME:  Fluoroscopy Time:  2 minutes and 10 seconds. COMPARISON:  None. FINDINGS: Four C-arm fluoroscopic images were obtained intraoperatively and submitted for post operative interpretation. These images demonstrate placement of pedicle screws at T1, T2, T4, and T5. There is limited fluoroscopic visualization. Please see the performing provider's procedural report for further detail. IMPRESSION: Intraoperative fluoroscopic imaging, as detailed above. Electronically Signed   By: Margaretha Sheffield MD   On: 12/30/2019 20:03    Assessment/Plan: Postop day #1: The patient is doing well.  We will mobilize him with PT and OT.  I will as oncology and radiation collagen to see the patient.  May go home tomorrow.  Hyperkalemia: We will repeat his BMP to make sure this is real.  LOS: 1 day     Ophelia Charter 12/31/2019, 10:03 AM

## 2019-12-31 NOTE — Progress Notes (Signed)
  Radiation Oncology         [phone] ________________________________  Name: Parke Jandreau MRN: [id number]  Date: 12/31/2019  DOB: [birth year]  In Patient Follow-Up  Note  CC: [name], MD  No ref. provider found   Diagnosis:  Clinical stage IIIA (T4, N0) left upper lobe lesion  (path pending)    Narrative:   The patient was seen by myself late last week.  After that consultation the patient was seen by Dr. Arnoldo Morale and it was recommended the patient proceed with surgery.  Yesterday the patient underwent a  T2-3 and T3-4 laminectomy for resection of epidural spinal spinal tumor; T1-2, T 2-3, T3-4 and T4-5 posterior arthrodesis with local autograft bone and Zimmer bone graft extender; posterior thoracic instrumentation T1-T5 bilaterally with globus titanium pedicle screws and rods.  The patient will require postoperative radiation therapy but is too early to initiate this treatment.  Radiation can delay wound healing.  The patient is scheduled for reevaluation and CT simulation on December 27 with treatments to begin soon afterward directed at the large mass within the chest and operative bed.  Anticipate at least 3 weeks of daily radiation therapy.   The patient will be called about his appointments prior to this date.    ____________________________________ Gery Pray, MD

## 2019-12-31 NOTE — Evaluation (Signed)
Occupational Therapy Evaluation Patient Details Name: Gregory Bautista MRN: [id number] DOB: [birth year] Today's Date: 12/31/2019    History of Present Illness 79 y.o. male presenting with L-sided chest pain. CXR and CT (+) T3 pathological compression fx, thoracic spinal stenosis, thoracic myelopathy, and paraparesis. Patient s/p T2-3 and T3-4 laminectomy for resection of epidural spinal tumor, T1-2, 2-3, 3-4 and 4-5 posterior athrodesis with bone graft and posterior thoracic instrumentation. PMHx significant for lung CA and tobacco use disorder.   Clinical Impression   PTA patient was living in a single-level private residence with his brother with 4 STE. Patient was Mod I with use of AD for ADLs/IADLs. Patient currently presents below baseline level of function requiring Min A grossly for ADLs including toileting, bathing, and LB dressing. Patient also limited by pain, decreased recall of back precautions, balance deficits presenting increased risk of falls, and gradual generalized weakness. Patient states that although his brother is able to provide 24hr supervision, he is not able to provide current level of assist needed for safe completion of ADLs with patient desiring d/c to CIR for intensive short-term rehab. OT will continue to follow acutely to maximize safety and independence with ADLs/IADLs with use of RW and good adherence to back precautions.     Follow Up Recommendations  CIR    Equipment Recommendations  3 in 1 bedside commode;Tub/shower bench    Recommendations for Other Services Rehab consult     Precautions / Restrictions Precautions Precautions: Back;Fall Precaution Booklet Issued: No (Received at previous PT session. Patient able to recall 1/3 precautions at time of OT eval.) Precaution Comments: Hemovac detatched upon entry. RN aware. Repeat education on spinal precautions. Restrictions Weight Bearing Restrictions: No      Mobility Bed Mobility Overal bed  mobility: Needs Assistance Bed Mobility: Rolling;Sidelying to Sit Rolling: Min guard Sidelying to sit: Min assist       General bed mobility comments: Cues for log rolling technqiue with Min guard for safety.    Transfers Overall transfer level: Needs assistance Equipment used: Standard walker Transfers: Sit to/from Stand;Stand Pivot Transfers Sit to Stand: Min guard Stand pivot transfers: Min assist       General transfer comment: Cues for hand placement and safety with RW.    Balance Overall balance assessment: Needs assistance Sitting-balance support: No upper extremity supported;Feet supported Sitting balance-Leahy Scale: Good Sitting balance - Comments: Static sitting EOB no LOB, supervision for safety.   Standing balance support: Bilateral upper extremity supported;During functional activity Standing balance-Leahy Scale: Poor Standing balance comment: Reliant on UE support on RW and min guard - minA for balance.                           ADL either performed or assessed with clinical judgement   ADL Overall ADL's : Needs assistance/impaired Eating/Feeding: Set up;Sitting   Grooming: Minimal assistance;Standing           Upper Body Dressing : Set up;Sitting   Lower Body Dressing: Minimal assistance;Sit to/from stand;Sitting/lateral leans   Toilet Transfer: Minimal assistance;RW   Toileting- Clothing Manipulation and Hygiene: Minimal assistance;Sit to/from stand       Functional mobility during ADLs: Min guard;Rolling walker       Vision Baseline Vision/History: Wears glasses Wears Glasses: At all times Patient Visual Report: No change from baseline Vision Assessment?: No apparent visual deficits     Perception     Praxis      Pertinent Vitals/Pain  Pain Assessment: 0-10 Pain Score: 4  Pain Location: operative site in back Pain Descriptors / Indicators: Burning;Sore Pain Intervention(s): Limited activity within patient's  tolerance;Monitored during session;Repositioned     Hand Dominance Right   Extremity/Trunk Assessment Upper Extremity Assessment Upper Extremity Assessment: Generalized weakness   Lower Extremity Assessment Lower Extremity Assessment: Defer to PT evaluation   Cervical / Trunk Assessment Cervical / Trunk Assessment: Other exceptions Cervical / Trunk Exceptions: s/p spinal sx.   Communication Communication Communication: No difficulties   Cognition Arousal/Alertness: Awake/alert Behavior During Therapy: WFL for tasks assessed/performed Overall Cognitive Status: Within Functional Limits for tasks assessed                                 General Comments: A&Ox4. Pt aware of deficits.   General Comments  SpO2 >97% on 2L via O2 throughout session.    Exercises     Shoulder Instructions      Home Living Family/patient expects to be discharged to:: Private residence Living Arrangements: Other relatives (brother) Available Help at Discharge: Available 24 hours/day;Family (Brother) Type of Home: House Home Access: Stairs to enter Technical brewer of Steps: 4 Entrance Stairs-Rails: Right;Left Home Layout: One level     Bathroom Shower/Tub: Corporate investment banker: Standard Bathroom Accessibility: Yes   Home Equipment: Environmental consultant - 4 wheels;Cane - single point;Hand held shower head   Additional Comments: Patient is concerned that his brother will not be able to provide current level of assist necessary for completion of ADLs.      Prior Functioning/Environment Level of Independence: Independent with assistive device(s)        Comments: Use of rollator in home and community dwellings. Patient was Mod I with ADLs/IADLs.        OT Problem List: Decreased strength;Decreased activity tolerance;Impaired balance (sitting and/or standing);Decreased coordination;Decreased safety awareness;Decreased knowledge of use of DME or AE;Decreased  knowledge of precautions;Pain      OT Treatment/Interventions: Self-care/ADL training;Therapeutic exercise;Energy conservation;DME and/or AE instruction;Therapeutic activities;Patient/family education;Balance training    OT Goals(Current goals can be found in the care plan section) Acute Rehab OT Goals Patient Stated Goal: To go to CIR OT Goal Formulation: With patient Time For Goal Achievement: 01/14/20 Potential to Achieve Goals: Good ADL Goals Pt Will Perform Grooming: with modified independence;standing Pt Will Perform Upper Body Dressing: with modified independence;sitting Pt Will Perform Lower Body Dressing: with modified independence;sit to/from stand Pt Will Transfer to Toilet: with modified independence;ambulating Pt Will Perform Toileting - Clothing Manipulation and hygiene: with modified independence;sit to/from stand Pt Will Perform Tub/Shower Transfer: Tub transfer;tub bench;grab bars;rolling walker Additional ADL Goal #1: Patient will recall 3/3 back precautions with good return demonstration and no cueing during functional activity.  OT Frequency: Min 2X/week   Barriers to D/C: Decreased caregiver support  Patient is concerned that his brother will not be able to provide current level of needed assist with ADLs and would like short-term rehab.       Co-evaluation              AM-PAC OT "6 Clicks" Daily Activity     Outcome Measure Help from another person eating meals?: None Help from another person taking care of personal grooming?: A Little Help from another person toileting, which includes using toliet, bedpan, or urinal?: A Little Help from another person bathing (including washing, rinsing, drying)?: A Little Help from another person to put on and taking  off regular upper body clothing?: A Little Help from another person to put on and taking off regular lower body clothing?: A Little 6 Click Score: 19   End of Session Equipment Utilized During Treatment:  Gait belt;Rolling walker Nurse Communication: Mobility status  Activity Tolerance: Patient tolerated treatment well Patient left: in bed;with call bell/phone within reach;with bed alarm set  OT Visit Diagnosis: Unsteadiness on feet (R26.81);Muscle weakness (generalized) (M62.81);History of falling (Z91.81);Pain Pain - part of body:  (At incision)                Time: 7981-0254 OT Time Calculation (min): 39 min Charges:  OT General Charges $OT Visit: 1 Visit OT Evaluation $OT Eval Moderate Complexity: 1 Mod OT Treatments $Self Care/Home Management : 8-22 mins  Ina Scrivens H. OTR/L Supplemental OT, Department of rehab services [phone]  Alto Gandolfo R H. 12/31/2019, 3:07 PM

## 2019-12-31 NOTE — Evaluation (Signed)
Physical Therapy Evaluation Patient Details Name: Gregory Bautista MRN: [id number] DOB: Dec 30, 1940 Today's Date: 12/31/2019   History of Present Illness  Pt is a 79 year old male who presented with L-sided chest pain. 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 The pt elected to have the following procedure on 12/30/19: T3 laminectomy with thoracic instrumentation and posterior fusion T1-5 with resection of epidural spinal tumor. PMH significant for lung cancer, tobacco use, and asthma.  Clinical Impression  Pt presented for surgical intervention mentioned above with deficits mentioned below, see PT Problem List. He uses a rollator at baseline independently for mobility. His brother is assisting pt as needed 24/7 at this time and is able to drive pt to appointments. Pt demonstrates strength deficits in bilat leg muscles, almost symmetrically. He also has aerobic and leg muscular endurance deficits noted as his sats would decrease when attempting to wean O2, see General Comments below, and his knees would buckle as distance with gait increased. He demonstrates difficulty with managing the RW during turns and with staying within RW and needs further education for safety. Pt would benefit from further PT services in the outpatient PT setting (possibly neuro specialty clinic) to address his deficits to maximize his independence and safety with mobility. Will continue to follow acutely.     Follow Up Recommendations Outpatient PT;Supervision for mobility/OOB (neuro specialty clinic if possible)    Equipment Recommendations  Rolling walker with 5" wheels    Recommendations for Other Services       Precautions / Restrictions Precautions Precautions: Back;Fall Precaution Booklet Issued: Yes (comment) Precaution Comments: Hemovac; Educated and reviewed spinal precautions Restrictions Weight Bearing Restrictions: No       Mobility  Bed Mobility Overal bed mobility: Needs Assistance Bed Mobility: Rolling;Sidelying to Sit Rolling: Min guard Sidelying to sit: Min assist       General bed mobility comments: Cues to log roll to maintain spinal precautions with success, min guard for safety. Cued pt to bring legs off bed while trunk ascends, with minA to complete.    Transfers Overall transfer level: Needs assistance Equipment used: Standard walker Transfers: Sit to/from Stand;Stand Pivot Transfers Sit to Stand: Min guard Stand pivot transfers: Min assist       General transfer comment: Cues for hand placement prior to coming to stand. Increased time and mild trunk sway noted with power up to stand, min guard for safety. MinA to manage and cues pt with RW to avoid stepping laterally outside of RW during turn.  Ambulation/Gait Ambulation/Gait assistance: Min assist Gait Distance (Feet): 20 Feet Assistive device: Rolling walker (2 wheeled) Gait Pattern/deviations: Step-through pattern;Decreased stride length;Narrow base of support Gait velocity: decreased Gait velocity interpretation: <1.31 ft/sec, indicative of household ambulator General Gait Details: Ambulates with narrow BOS slowly. Knee buckling noted as pt fatigued, esp on L side, with minA and cues at knees to extend. Cues to remain within RW as he tends to let it get distal or step outside of RW laterally during turns.  Stairs            Wheelchair Mobility    Modified Rankin (Stroke Patients Only)       Balance Overall balance assessment: Needs assistance Sitting-balance support: No upper extremity supported;Feet supported Sitting balance-Leahy Scale: Good Sitting balance - Comments: Static sitting EOB no LOB, supervision for safety.   Standing balance support: Bilateral upper extremity supported;During functional activity Standing balance-Leahy  Scale: Poor Standing balance comment: Reliant on UE support on RW and min guard -  minA for balance.                             Pertinent Vitals/Pain Pain Assessment: 0-10 Pain Score: 4  Pain Location: operative site in back Pain Descriptors / Indicators: Burning;Sore Pain Intervention(s): Limited activity within patient's tolerance;Monitored during session;Repositioned    Home Living Family/patient expects to be discharged to:: Private residence Living Arrangements: Other relatives (brother) Available Help at Discharge: Available 24 hours/day;Family (brother) Type of Home: House Home Access: Stairs to enter Entrance Stairs-Rails: Psychiatric nurse of Steps: 4 Home Layout: One level Home Equipment: Environmental consultant - 4 wheels;Cane - single point;Hand held shower head Additional Comments: Brother available until no longer needed. Other family may arrive to help in a few weeks.    Prior Function Level of Independence: Independent with assistive device(s)         Comments: Utilized SPC until brother brought rollator and now primarily uses rollator for mobility. Independent with all ADLs prior to start of symptoms.     Hand Dominance   Dominant Hand: Right    Extremity/Trunk Assessment   Upper Extremity Assessment Upper Extremity Assessment: Defer to OT evaluation    Lower Extremity Assessment Lower Extremity Assessment: RLE deficits/detail;LLE deficits/detail RLE Deficits / Details: MMT scores of 4- to 4 grossly throughout RLE Sensation: WNL RLE Coordination: WNL LLE Deficits / Details: MMT scores of 4- to 4 grossly throughout LLE Sensation: WNL LLE Coordination: WNL    Cervical / Trunk Assessment Cervical / Trunk Assessment: Normal  Communication   Communication: No difficulties  Cognition Arousal/Alertness: Awake/alert Behavior During Therapy: WFL for tasks assessed/performed Overall Cognitive Status: Within Functional Limits for tasks assessed                                 General Comments: A&Ox4. Pt  aware of deficits.      General Comments General comments (skin integrity, edema, etc.): O2 desat to 87% sitting EOB without supplemental O2 thus reapplied O2 at 2L/min via [place] with sats >/= 94%    Exercises     Assessment/Plan    PT Assessment Patient needs continued PT services  PT Problem List Decreased strength;Decreased activity tolerance;Decreased balance;Decreased mobility;Decreased coordination;Decreased knowledge of use of DME;Decreased knowledge of precautions;Pain       PT Treatment Interventions DME instruction;Gait training;Stair training;Functional mobility training;Therapeutic activities;Therapeutic exercise;Balance training;Neuromuscular re-education;Patient/family education    PT Goals (Current goals can be found in the Care Plan section)  Acute Rehab PT Goals Patient Stated Goal: to decrease burden on brother PT Goal Formulation: With patient Time For Goal Achievement: 01/14/20 Potential to Achieve Goals: Fair    Frequency Min 5X/week   Barriers to discharge        Co-evaluation               AM-PAC PT "6 Clicks" Mobility  Outcome Measure Help needed turning from your back to your side while in a flat bed without using bedrails?: A Little Help needed moving from lying on your back to sitting on the side of a flat bed without using bedrails?: A Little Help needed moving to and from a bed to a chair (including a wheelchair)?: A Little Help needed standing up from a chair using your arms (e.g., wheelchair or bedside chair)?: A Little  Help needed to walk in hospital room?: A Little Help needed climbing 3-5 steps with a railing? : A Lot 6 Click Score: 17    End of Session Equipment Utilized During Treatment: Gait belt;Oxygen Activity Tolerance: Patient tolerated treatment well Patient left: in chair;with call bell/phone within reach;with chair alarm set Nurse Communication: Mobility status;Other (comment) (O2 sats) PT Visit Diagnosis: Unsteadiness on  feet (R26.81);Other abnormalities of gait and mobility (R26.89);Muscle weakness (generalized) (M62.81);Difficulty in walking, not elsewhere classified (R26.2);Pain Pain - Right/Left:  (back) Pain - part of body:  (back)    Time: 2992-4268 PT Time Calculation (min) (ACUTE ONLY): 37 min   Charges:   PT Evaluation $PT Eval Moderate Complexity: 1 Mod PT Treatments $Gait Training: 8-22 mins        Moishe Spice, PT, DPT Acute Rehabilitation Services  Pager: [phone] Office: [phone]   Orvan Falconer 12/31/2019, 11:05 AM

## 2019-12-31 NOTE — Telephone Encounter (Signed)
Megan, with Dr. Arnoldo Morale, called to refer patient to Radiation. I explained that we show patient set up for an outpatient CT SIM after his consult with Dr. Sondra Come all on 12/27. She is wondering if this all can be moved up with patient being in the hospital. I have called Kayla in [facility] who will reach out to Dr. Sondra Come to review. Megan phone# [phone].

## 2020-01-01 ENCOUNTER — Other Ambulatory Visit (HOSPITAL_COMMUNITY): Payer: Medicare Other

## 2020-01-01 NOTE — Progress Notes (Signed)
Subjective: The patient is alert and pleasant.  His back is appropriately sore.  Objective: Vital signs in last 24 hours: Temp:  [98 F (36.7 C)-98.2 F (36.8 C)] 98.1 F (36.7 C) (12/10 0300) Pulse Rate:  [97-98] 97 (12/10 0300) Resp:  [17] 17 (12/10 0300) BP: (96-144)/(60-84) 144/84 (12/10 0300) SpO2:  [93 %-99 %] 99 % (12/10 0100) Estimated body mass index is 23.48 kg/m as calculated from the following:   Height as of this encounter: 5\' 9"  (1.753 m).   Weight as of this encounter: 72.1 kg.   Intake/Output from previous day: 12/09 0701 - 12/10 0700 In: 400 [IV Piggyback:400] Out: 625 [Urine:500; Drains:125] Intake/Output this shift: No intake/output data recorded.  Physical exam the patient is alert and pleasant.  His strength is normal.  Lab Results: Recent Labs    12/29/19 1104 12/31/19 0404  WBC 5.2 7.5  HGB 14.7 12.3*  HCT 45.9 38.9*  PLT 199 197   BMET Recent Labs    12/31/19 0404 12/31/19 1104  NA 141 138  K 5.8* 4.7  CL 104 104  CO2 27 23  GLUCOSE 127* 98  BUN 18 18  CREATININE 1.14 0.99  CALCIUM 9.7 9.5    Studies/Results: DG Thoracic Spine 2 View  Result Date: 12/30/2019 CLINICAL DATA:  T1 through T4 fusion. EXAM: DG C-ARM 1-60 MIN; THORACIC SPINE 2 VIEWS FLUOROSCOPY TIME:  Fluoroscopy Time:  2 minutes and 10 seconds. COMPARISON:  None. FINDINGS: Four C-arm fluoroscopic images were obtained intraoperatively and submitted for post operative interpretation. These images demonstrate placement of pedicle screws at T1, T2, T4, and T5. There is limited fluoroscopic visualization. Please see the performing provider's procedural report for further detail. IMPRESSION: Intraoperative fluoroscopic imaging, as detailed above. Electronically Signed   By: Margaretha Sheffield MD   On: 12/30/2019 20:03   DG C-Arm 1-60 Min  Result Date: 12/30/2019 CLINICAL DATA:  T1 through T4 fusion. EXAM: DG C-ARM 1-60 MIN; THORACIC SPINE 2 VIEWS FLUOROSCOPY TIME:  Fluoroscopy  Time:  2 minutes and 10 seconds. COMPARISON:  None. FINDINGS: Four C-arm fluoroscopic images were obtained intraoperatively and submitted for post operative interpretation. These images demonstrate placement of pedicle screws at T1, T2, T4, and T5. There is limited fluoroscopic visualization. Please see the performing provider's procedural report for further detail. IMPRESSION: Intraoperative fluoroscopic imaging, as detailed above. Electronically Signed   By: Margaretha Sheffield MD   On: 12/30/2019 20:03    Assessment/Plan: Postop day #2: The patient is progressing slowly.  He will likely go home this weekend.  I gave him his discharge instructions.  He has follow-up with Dr. 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  LOS: 2 days     Ophelia Charter 01/01/2020, 7:43 AM

## 2020-01-01 NOTE — TOC Initial Note (Signed)
Transition of Care [facility]) - Initial/Assessment Note    Patient Details  Name: Gregory Bautista MRN: [id number] Date of Birth: [birth year]  [facility] CM/SW Contact:    Pollie Friar, RN Phone Number: 01/01/2020, 2:26 PM  Clinical Narrative:                 Pt is from home with his brother. He is s/p THORACIC THREE LAMINECTOMY, THORACIC INSTRUMENTATION AND FUSION THORACIC ONE-FIVE. CM met with the patient and he doesn't feel his brother is going to be able to provide the assistance at home he currently is requiring.  He does have a walker at home.  CM asked PT to see again today and updated them on his concerns.  TOC following.   Expected Discharge Plan: IP Rehab Facility Barriers to Discharge: Continued Medical Work up   Patient Goals and CMS Choice     Choice offered to / list presented to : Patient  Expected Discharge Plan and Services Expected Discharge Plan: [place]   Discharge Planning Services: CM Consult Post Acute Care Choice: IP Rehab                                        Prior Living Arrangements/Services   Lives with:: Siblings Patient language and need for interpreter reviewed:: Yes Do you feel safe going back to the place where you live?: Yes      Need for Family Participation in Patient Care: Yes (Comment) Care giver support system in place?: Yes (comment)   Criminal Activity/Legal Involvement Pertinent to Current Situation/Hospitalization: No - Comment as needed  Activities of Daily Living Home Assistive Devices/Equipment: Eyeglasses,Walker (specify type),Cane (specify quad or straight) ADL Screening (condition at time of admission) Patient's cognitive ability adequate to safely complete daily activities?: Yes Is the patient deaf or have difficulty hearing?: No Does the patient have difficulty seeing, even when wearing glasses/contacts?: No Does the patient have difficulty concentrating, remembering, or making  decisions?: Yes Patient able to express need for assistance with ADLs?: Yes Does the patient have difficulty dressing or bathing?: No Independently performs ADLs?: Yes (appropriate for developmental age) Does the patient have difficulty walking or climbing stairs?: No Weakness of Legs: Both Weakness of Arms/Hands: None  Permission Sought/Granted                  Emotional Assessment Appearance:: Appears stated age Attitude/Demeanor/Rapport: Engaged Affect (typically observed): Accepting Orientation: : Oriented to Self,Oriented to Place,Oriented to  Time,Oriented to Situation   Psych Involvement: No (comment)  Admission diagnosis:  Thoracic spine tumor [D49.2] Spinal stenosis of lumbar region [M48.061] Patient Active Problem List   Diagnosis Date Noted  . 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 Mass of upper lobe of left lung 12/24/2019  . Tobacco use disorder 09/24/2019   PCP:  [name], MD Pharmacy:   [facility] [place] - [address] [place] ([place]) [place] [address] Phone: [phone] Fax: [phone]     Social Determinants of Health (SDOH) Interventions    Readmission Risk Interventions No flowsheet data found.

## 2020-01-01 NOTE — Progress Notes (Signed)
Physical Therapy Treatment Patient Details Name: Gregory Bautista MRN: [id number] DOB: 1940/10/21 Today's Date: 01/01/2020    History of Present Illness Pt is a 79 year old male who presented with L-sided chest pain. 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 The pt elected to have the following procedure on 12/30/19: T3 laminectomy with thoracic instrumentation and posterior fusion T1-5 with resection of epidural spinal tumor. PMH significant for lung cancer, tobacco use, and asthma.    PT Comments    Pt is making steady progress towards his goals through increasing the distance he is capable of ambulating. He ambulated x2 bouts of ~20 ft each with RW and minA this date. He demonstrates weakness primarily in his quadriceps muscles through knee buckling during gait. Provided tactile cues at [place] during stance phase to increase stability, with success. He also ambulates with a narrow BOS, impacting his balance. Cued to correct with success. Also, pt educated to perform LAQ to inc quads strength and endurance with him only being able to perform 3-4 reps bilat before becoming fatigued and needing a rest break, indicating significant strength and endurance deficits in this muscle group. Pt demonstrates continued endurance, balance, and strength deficits that limit his safety and independence with functional mobility. Per OT, brother does not feel he can care for pt at this level of care currently. Thus, to maximize pt safety and independence with mobility recommending CIR prior to return home. Will continue to follow acutely.    Follow Up Recommendations  Supervision for mobility/OOB;CIR     Equipment Recommendations  Rolling walker with 5" wheels    Recommendations for Other Services Rehab consult     Precautions / Restrictions Precautions Precautions: Back;Fall Precaution Booklet Issued: Yes  (comment) Precaution Comments: recalled 2/3 and required visual cue for 3rd (no lifting) spinal precaution Restrictions Weight Bearing Restrictions: No    Mobility  Bed Mobility Overal bed mobility: Needs Assistance Bed Mobility: Rolling;Sidelying to Sit Rolling: Min guard Sidelying to sit: Min assist;HOB elevated       General bed mobility comments: Cues to log roll to maintain spinal precautions with success, min guard for safety. Cued pt to bring legs off bed while trunk ascends, with minA to complete.  Transfers Overall transfer level: Needs assistance Equipment used: Rolling walker (2 wheeled) Transfers: Sit to/from [facility] Sit to Stand: Min assist Stand pivot transfers: Min assist       General transfer comment: Cues for hand placement prior to coming to stand as he tends to pull up on RW. Increased time and mild trunk sway noted with power up to stand, minA. MinA to manage RW to avoid stepping laterally outside of it during turn to transfer to chair.  Ambulation/Gait Ambulation/Gait assistance: Min assist Gait Distance (Feet): 20 Feet (x2 bouts of ~20 ft each) Assistive device: Rolling walker (2 wheeled) Gait Pattern/deviations: Step-through pattern;Decreased stride length;Narrow base of support;Decreased stance time - right;Decreased step length - left Gait velocity: decreased Gait velocity interpretation: <1.31 ft/sec, indicative of household ambulator General Gait Details: Ambulates with narrow BOS and knee buckling. Displays tendency to push RW distal to body and requires cues to correct, with mod success. Provided visual and verbal cues to increase stance width with improvements noted on 2nd bout. Provided tactile cues at quads during 2nd bout to encourage knee extension during stance bilat with success. Tendency to flex at trunk when fatigued with cues to  correct momentarily.   Stairs             Wheelchair Mobility    Modified Rankin  (Stroke Patients Only)       Balance Overall balance assessment: Needs assistance Sitting-balance support: No upper extremity supported;Feet supported Sitting balance-Leahy Scale: Good Sitting balance - Comments: Static sitting EOB no LOB, supervision for safety.   Standing balance support: Bilateral upper extremity supported;During functional activity Standing balance-Leahy Scale: Poor Standing balance comment: Reliant on UE support on RW and min guard - minA for balance.                            Cognition Arousal/Alertness: Awake/alert Behavior During Therapy: WFL for tasks assessed/performed Overall Cognitive Status: Within Functional Limits for tasks assessed                                 General Comments: A&Ox4. Pt aware of deficits.      Exercises General Exercises - Lower Extremity Long Arc Quad: Both;Other reps (comment);Seated (x2 sets of 3-4 reps (pt's selected number of reps to fatigue))    General Comments General comments (skin integrity, edema, etc.): Upon arrival [place] doffed and pt sats 87-91% thus donned supplemental O2 at 2L/min via  with sats >/= 92% throughout; HR 110-120s      Pertinent Vitals/Pain Pain Assessment: 0-10 Pain Score: 7  Pain Location: operative site in back and shoulders Pain Descriptors / Indicators: Cramping;Discomfort Pain Intervention(s): Limited activity within patient's tolerance;Monitored during session;Premedicated before session;Repositioned    Home Living                      Prior Function            PT Goals (current goals can now be found in the care plan section) Acute Rehab PT Goals Patient Stated Goal: to improve and go to CIR PT Goal Formulation: With patient Time For Goal Achievement: 01/14/20 Potential to Achieve Goals: Good Progress towards PT goals: Progressing toward goals    Frequency    Min 5X/week      PT Plan Discharge plan needs to be updated     Co-evaluation              AM-PAC PT "6 Clicks" Mobility   Outcome Measure  Help needed turning from your back to your side while in a flat bed without using bedrails?: A Little Help needed moving from lying on your back to sitting on the side of a flat bed without using bedrails?: A Little Help needed moving to and from a bed to a chair (including a wheelchair)?: A Little Help needed standing up from a chair using your arms (e.g., wheelchair or bedside chair)?: A Little Help needed to walk in hospital room?: A Little Help needed climbing 3-5 steps with a railing? : A Lot 6 Click Score: 17    End of Session Equipment Utilized During Treatment: Gait belt;Oxygen (2L/min) Activity Tolerance: Patient tolerated treatment well Patient left: in chair;with call bell/phone within reach;with chair alarm set Nurse Communication: Mobility status;Other (comment) (O2 sats) PT Visit Diagnosis: Unsteadiness on feet (R26.81);Other abnormalities of gait and mobility (R26.89);Muscle weakness (generalized) (M62.81);Difficulty in walking, not elsewhere classified (R26.2);Pain Pain - Right/Left:  (back) Pain - part of body:  (back)     Time: 6195-0932 PT Time Calculation (min) (ACUTE ONLY): 37 min  Charges:  $Gait Training: 8-22 mins $Therapeutic Exercise: 8-22 mins                     Gregory Bautista, PT, DPT Acute Rehabilitation Services  Pager: [phone] Office: [phone]    Gregory Bautista 01/01/2020, 4:53 PM

## 2020-01-01 NOTE — Plan of Care (Signed)
  Problem: Education: Goal: Required Educational Video(s) Outcome: Progressing   Problem: Clinical Measurements: Goal: Postoperative complications will be avoided or minimized Outcome: Progressing   Problem: Skin Integrity: Goal: Demonstration of wound healing without infection will improve Outcome: Progressing   

## 2020-01-01 NOTE — Progress Notes (Signed)
Drain has been removed per Rn, therefore, we will dc IV vanc.  Onnie Boer, PharmD, BCIDP, AAHIVP, CPP Infectious Disease Pharmacist 01/01/2020 9:04 AM

## 2020-01-02 MED ORDER — BISACODYL 5 MG PO TBEC
5.0000 mg | DELAYED_RELEASE_TABLET | Freq: Every day | ORAL | Status: DC | PRN
  Administered 2020-01-02: 10 mg via ORAL
  Administered 2020-01-03 (×2): 5 mg via ORAL
  Administered 2020-01-04 – 2020-01-06 (×2): 10 mg via ORAL
  Filled 2020-01-02 (×2): qty 1
  Filled 2020-01-02 (×3): qty 2

## 2020-01-02 NOTE — Progress Notes (Signed)
He still having moderate discomfort.  He would like to stay for pain control.  I think that is reasonable.  Still requiring IV pain medications.  Moving legs well.

## 2020-01-02 NOTE — Progress Notes (Signed)
Pt bladder appeared distended and pt having some incontinence, pt bladder scanned for 865ml and I&O cath for 1268ml. Urine was clear, yellow with no odor. Peri-care done and pt resting comfortably in bed with call light within reach and bed alarm on. Will continue to closely monitor pt. Delia Heady RN

## 2020-01-02 NOTE — Progress Notes (Signed)
Physical Therapy Treatment Patient Details Name: Gregory Bautista MRN: [id number] DOB: Jun 20, 1940 Today's Date: 01/02/2020    History of Present Illness Pt is a 79 year old male who presented with L-sided chest pain. 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 The pt elected to have the following procedure on 12/30/19: T3 laminectomy with thoracic instrumentation and posterior fusion T1-5 with resection of epidural spinal tumor. PMH significant for lung cancer, tobacco use, and asthma.    PT Comments    Pt making progress towards his goals. Demonstrated improved quadriceps muscle contraction for improved knee stability in standing this date, but still required tactile cues bilat to decrease knee buckling with gait. Demonstrated urinary incontinence when coming to stand this date and even after feeling as if he were done urinating, RN made aware. Pt displayed improved aerobic and muscular endurance through standing for increased periods of time and through not receiving supplemental O2 during session. However, his SpO2 levels did decrease to as low as 87-88% on occasion during gait, but quickly increased once sitting and resting. He displays difficulty managing the RW, especially when turning or taking steps posteriorly, requiring extensive cues to sequence feet steps and pulling of RW to back up. Will continue to follow acutely. Current recommendations remain appropriate.    Supervision for mobility/OOB;CIR     Equipment Recommendations  Rolling walker with 5" wheels    Recommendations for Other Services Rehab consult     Precautions / Restrictions Precautions Precautions: Back;Fall Precaution Booklet Issued: Yes (comment) Precaution Comments: reviewed spinal precautions Restrictions Weight Bearing Restrictions: No    Mobility  Bed Mobility Overal bed mobility: Needs Assistance Bed Mobility:  Rolling;Sidelying to Sit Rolling: Min guard Sidelying to sit: Min guard;HOB elevated       General bed mobility comments: Cues to log roll to maintain spinal precautions with success, min guard for safety. Cued pt to bring legs off bed while trunk ascends, with extra time and min guard to complete.  Transfers Overall transfer level: Needs assistance Equipment used: Rolling walker (2 wheeled) Transfers: Sit to/from [facility] Sit to Stand: Min assist Stand pivot transfers: Min assist       General transfer comment: Cues for hand placement prior to coming to stand as he tends to pull up on RW, good carryover with subsequent rep. Increased time and mild trunk sway noted with power up to stand, minA. MinA to manage RW and provide stability to bilat knees during turns to transfer laterally to chair with stand step transfer.  Ambulation/Gait Ambulation/Gait assistance: Min assist Gait Distance (Feet): 20 Feet (x2 bouts) Assistive device: Rolling walker (2 wheeled) Gait Pattern/deviations: Step-through pattern;Decreased stride length;Narrow base of support;Decreased stance time - right;Decreased step length - left Gait velocity: decreased Gait velocity interpretation: <1.31 ft/sec, indicative of household ambulator General Gait Details: Ambulates with narrow BOS and knee buckling. Provided tactile cues at quads to encourage knee extension during stance bilat with success. When taking steps posteriorly to reach target sitting surface pt required extensive cues to manage RW, keeping it on ground rather than lifting it, and to sequence steps posteriorly. Urinary incontinence noted on initial stand and start of gait.   Stairs             Wheelchair Mobility    Modified Rankin (Stroke Patients Only)       Balance Overall balance assessment: Needs assistance Sitting-balance support: No upper extremity supported;Feet  supported Sitting balance-Leahy Scale:  Good Sitting balance - Comments: Static sitting EOB no LOB, supervision for safety.   Standing balance support: Bilateral upper extremity supported;During functional activity Standing balance-Leahy Scale: Poor Standing balance comment: Reliant on UE support on RW and min guard - minA for balance.                            Cognition Arousal/Alertness: Awake/alert Behavior During Therapy: WFL for tasks assessed/performed Overall Cognitive Status: Within Functional Limits for tasks assessed                                 General Comments: A&Ox4. Pt aware of deficits.      Exercises      General Comments General comments (skin integrity, edema, etc.): RN notified of SpO2 decreasing to 87-88% with gait but quick return to >/= 95% once seated      Pertinent Vitals/Pain Pain Assessment: 0-10 Pain Score: 2  Pain Location: operative site in back and shoulders Pain Descriptors / Indicators: Cramping;Discomfort Pain Intervention(s): Limited activity within patient's tolerance;Monitored during session;Repositioned;Patient requesting pain meds-RN notified    Home Living                      Prior Function            PT Goals (current goals can now be found in the care plan section) Acute Rehab PT Goals Patient Stated Goal: to improve and go to CIR PT Goal Formulation: With patient Time For Goal Achievement: 01/14/20 Potential to Achieve Goals: Good Progress towards PT goals: Progressing toward goals    Frequency    Min 5X/week      PT Plan Discharge plan needs to be updated    Co-evaluation              AM-PAC PT "6 Clicks" Mobility   Outcome Measure  Help needed turning from your back to your side while in a flat bed without using bedrails?: A Little Help needed moving from lying on your back to sitting on the side of a flat bed without using bedrails?: A Little Help needed moving to and from a bed to a chair (including a  wheelchair)?: A Little Help needed standing up from a chair using your arms (e.g., wheelchair or bedside chair)?: A Little Help needed to walk in hospital room?: A Little Help needed climbing 3-5 steps with a railing? : A Lot 6 Click Score: 17    End of Session Equipment Utilized During Treatment: Gait belt Activity Tolerance: Patient tolerated treatment well Patient left: in chair;with call bell/phone within reach;with chair alarm set Nurse Communication: Mobility status;Other (comment) (O2 sats; urinary incontinence) PT Visit Diagnosis: Unsteadiness on feet (R26.81);Other abnormalities of gait and mobility (R26.89);Muscle weakness (generalized) (M62.81);Difficulty in walking, not elsewhere classified (R26.2);Pain Pain - Right/Left:  (back) Pain - part of body:  (back)     Time: 0938-1829 PT Time Calculation (min) (ACUTE ONLY): 34 min  Charges:  $Gait Training: 23-37 mins                     Moishe Spice, PT, DPT Acute Rehabilitation Services  Pager: [phone] Office: [place] 01/02/2020, 4:49 PM

## 2020-01-03 LAB — COMPREHENSIVE METABOLIC PANEL
ALT: 29 U/L (ref 0–44)
AST: 37 U/L (ref 15–41)
Albumin: 2.2 g/dL — ABNORMAL LOW (ref 3.5–5.0)
Alkaline Phosphatase: 68 U/L (ref 38–126)
Anion gap: 9 (ref 5–15)
BUN: 37 mg/dL — ABNORMAL HIGH (ref 8–23)
CO2: 28 mmol/L (ref 22–32)
Calcium: 9.7 mg/dL (ref 8.9–10.3)
Chloride: 102 mmol/L (ref 98–111)
Creatinine, Ser: 1.54 mg/dL — ABNORMAL HIGH (ref 0.61–1.24)
GFR, Estimated: 46 mL/min — ABNORMAL LOW (ref >60)
Glucose, Bld: 67 mg/dL — ABNORMAL LOW (ref 70–99)
Potassium: 3.9 mmol/L (ref 3.5–5.1)
Sodium: 139 mmol/L (ref 135–145)
Total Bilirubin: 0.5 mg/dL (ref 0.3–1.2)
Total Protein: 5.6 g/dL — ABNORMAL LOW (ref 6.5–8.1)

## 2020-01-03 LAB — URINALYSIS, ROUTINE W REFLEX MICROSCOPIC
Bilirubin Urine: NEGATIVE
Glucose, UA: NEGATIVE mg/dL
Ketones, ur: NEGATIVE mg/dL
Leukocytes,Ua: NEGATIVE
Nitrite: NEGATIVE
Protein, ur: 30 mg/dL — AB
Specific Gravity, Urine: 1.019 (ref 1.005–1.030)
pH: 5 (ref 5.0–8.0)

## 2020-01-03 LAB — CBC
HCT: 32.1 % — ABNORMAL LOW (ref 39.0–52.0)
Hemoglobin: 10.5 g/dL — ABNORMAL LOW (ref 13.0–17.0)
MCH: 29.7 pg (ref 26.0–34.0)
MCHC: 32.7 g/dL (ref 30.0–36.0)
MCV: 90.9 fL (ref 80.0–100.0)
Platelets: 189 10*3/uL (ref 150–400)
RBC: 3.53 MIL/uL — ABNORMAL LOW (ref 4.22–5.81)
RDW: 13.2 % (ref 11.5–15.5)
WBC: 5.9 10*3/uL (ref 4.0–10.5)
nRBC: 0 % (ref 0.0–0.2)

## 2020-01-03 MED ORDER — CHLORHEXIDINE GLUCONATE CLOTH 2 % EX PADS
6.0000 | MEDICATED_PAD | Freq: Every day | CUTANEOUS | Status: DC
  Administered 2020-01-03 – 2020-01-12 (×10): 6 via TOPICAL

## 2020-01-03 MED ORDER — SODIUM CHLORIDE 0.9 % IV SOLN: INTRAVENOUS | Status: DC

## 2020-01-03 MED ORDER — TAMSULOSIN HCL 0.4 MG PO CAPS
0.4000 mg | ORAL_CAPSULE | Freq: Every day | ORAL | Status: DC
  Administered 2020-01-03 – 2020-01-12 (×10): 0.4 mg via ORAL
  Filled 2020-01-03 (×10): qty 1

## 2020-01-03 MED ORDER — SODIUM CHLORIDE 0.9 % IV BOLUS
500.0000 mL | Freq: Once | INTRAVENOUS | Status: AC
  Administered 2020-01-03: 500 mL via INTRAVENOUS

## 2020-01-03 NOTE — Progress Notes (Signed)
Doing ok, pain is better controlled but has urinary retention. No perineal N, walking ok. Continue PT/OT, foley, flomax.

## 2020-01-03 NOTE — Progress Notes (Signed)
Patient's bladder scan at 1200 was under 285ml continued to monitor for voiding; patient c/o low appetite and weaknes, dizziness with activity in the chair. BP lower; MD paged and orders received.  Patient worked with therapy and attempted to eat his afternoon meals; repeat bladder scan at 1700 revealed 500 ml residual urine; orders to place foley; patient tolerated without distress; continue to manage pain; encourage hydration post op and mobility as tolerated.

## 2020-01-03 NOTE — Progress Notes (Signed)
Physical Therapy Treatment Patient Details Name: Gregory Bautista MRN: [id number] DOB: 12/17/1940 Today's Date: 01/03/2020    History of Present Illness Pt is a 79 year old male who presented with L-sided chest pain. 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 The pt elected to have the following procedure on 12/30/19: T3 laminectomy with thoracic instrumentation and posterior fusion T1-5 with resection of epidural spinal tumor. PMH significant for lung cancer, tobacco use, and asthma.    PT Comments    RN notified therapist that pt has been dehydrated, having low blood pressure, and unable to void bladder this date. BP in appropriate range without signs/symptoms during session. However, SpO2 levels were in low 80%s upon arrival with pt on RA thus donned [place] at 3 L/min with SpO2 ranging 87-92%, RN notified. Focused session on bed mob and transfers along with strengthening his legs. He demonstrates improved quadriceps endurance through being able to perform an increased number of reps of LAQ to fatigue this date compared to previous sessions. Pt displays hip flexor and gluteal strength deficits through difficulty performing only 5 reps of SLR or bridges in bed. Required minA for all transfers and modA to return to sidelying from sitting EOB this date, most likely due to hip flexor weakness limiting his ability to lift his legs onto the bed. Will continue to follow acutely. Current recommendations remain appropriate.  Follow Up Recommendations  Supervision for mobility/OOB;CIR     Equipment Recommendations  Rolling walker with 5" wheels    Recommendations for Other Services Rehab consult     Precautions / Restrictions Precautions Precautions: Back;Fall Precaution Booklet Issued: Yes (comment) Precaution Comments: reviewed spinal precautions Restrictions Weight Bearing Restrictions: No    Mobility  Bed  Mobility Overal bed mobility: Needs Assistance Bed Mobility: Rolling;Sidelying to Sit;Sit to Sidelying Rolling: Min guard Sidelying to sit: Min guard;HOB elevated     Sit to sidelying: Mod assist;HOB elevated General bed mobility comments: Cues to log roll to maintain spinal precautions with success, min guard for safety. Cued pt to bring legs off bed while trunk ascends, with extra time and min guard to complete. Returning to sidelying from sit pt required modA to manage legs with cues to descend onto elbow as legs rise.  Transfers Overall transfer level: Needs assistance Equipment used: Rolling walker (2 wheeled) Transfers: Sit to/from [facility] Sit to Stand: Min assist Stand pivot transfers: Min assist       General transfer comment: Extra time and minA to steady pt with power up to stand. UE support on therapist to stand step bed <> commode this date with cues for sequencing steps.  Ambulation/Gait Ambulation/Gait assistance: Min assist Gait Distance (Feet): 3 Feet (x2 bouts) Assistive device: Rolling walker (2 wheeled) Gait Pattern/deviations: Step-through pattern;Decreased stride length;Narrow base of support;Decreased stance time - right;Decreased step length - left Gait velocity: decreased Gait velocity interpretation: <1.31 ft/sec, indicative of household ambulator General Gait Details: Several side steps with bilat UE support on therapist to transfer between surfaces, minA for steadying and cues to sequence steps. Knee buckling noted.   Stairs             Wheelchair Mobility    Modified Rankin (Stroke Patients Only)       Balance Overall balance assessment: Needs assistance Sitting-balance support: No upper extremity supported;Feet supported Sitting balance-Leahy Scale: Good Sitting balance - Comments: Static sitting EOB no LOB, supervision for safety.  Standing balance support: Bilateral upper extremity supported;During functional  activity Standing balance-Leahy Scale: Poor Standing balance comment: Reliant on UE support and minA for balance.                            Cognition Arousal/Alertness: Awake/alert Behavior During Therapy: WFL for tasks assessed/performed Overall Cognitive Status: Within Functional Limits for tasks assessed                                 General Comments: A&Ox4. Pt aware of deficits.      Exercises General Exercises - Lower Extremity Gluteal Sets: Strengthening;Both;5 reps;Supine (bridging with min buttocks clearance) Long Arc Quad: Both;10 reps;Seated (x2 sets) Straight Leg Raises: Both;5 reps;Supine    General Comments General comments (skin integrity, edema, etc.): RN requested pt return to bed at end of session due to inabiltiy to void bladder actively. BP WNL and no reports of dizziness this date. SpO2 in low 80%s upon arrival on RA, thus donned [place] at 3L/min with SpO2 ranging 87-92% throughout, RN notified.      Pertinent Vitals/Pain Pain Assessment: No/denies pain Pain Intervention(s): Limited activity within patient's tolerance;Monitored during session;Repositioned    Home Living                      Prior Function            PT Goals (current goals can now be found in the care plan section) Acute Rehab PT Goals Patient Stated Goal: to improve and go to CIR PT Goal Formulation: With patient Time For Goal Achievement: 01/14/20 Potential to Achieve Goals: Good Progress towards PT goals: Progressing toward goals    Frequency    Min 5X/week      PT Plan Current plan remains appropriate    Co-evaluation              AM-PAC PT "6 Clicks" Mobility   Outcome Measure  Help needed turning from your back to your side while in a flat bed without using bedrails?: A Little Help needed moving from lying on your back to sitting on the side of a flat bed without using bedrails?: A Little Help needed moving to and from a bed to  a chair (including a wheelchair)?: A Little Help needed standing up from a chair using your arms (e.g., wheelchair or bedside chair)?: A Little Help needed to walk in hospital room?: A Little Help needed climbing 3-5 steps with a railing? : A Lot 6 Click Score: 17    End of Session Equipment Utilized During Treatment: Gait belt Activity Tolerance: Patient tolerated treatment well Patient left: with call bell/phone within reach;in bed;with bed alarm set Nurse Communication: Mobility status;Other (comment) (O2 sats; urinary incontinence) PT Visit Diagnosis: Unsteadiness on feet (R26.81);Other abnormalities of gait and mobility (R26.89);Muscle weakness (generalized) (M62.81);Difficulty in walking, not elsewhere classified (R26.2);Pain     Time: 9470-9628 PT Time Calculation (min) (ACUTE ONLY): 31 min  Charges:  $Therapeutic Exercise: 8-22 mins $Therapeutic Activity: 8-22 mins                     Moishe Spice, PT, DPT Acute Rehabilitation Services  Pager: [phone] Office: [place] 01/03/2020, 5:23 PM

## 2020-01-04 ENCOUNTER — Encounter (HOSPITAL_COMMUNITY): Payer: Self-pay | Admitting: Neurosurgery

## 2020-01-04 DIAGNOSIS — D492 Neoplasm of unspecified behavior of bone, soft tissue, and skin: Secondary | ICD-10-CM

## 2020-01-04 LAB — SURGICAL PATHOLOGY

## 2020-01-04 NOTE — PMR Pre-admission (Signed)
PMR Admission Coordinator Pre-Admission Assessment  Patient: Gregory Bautista is an 79 y.o., male MRN: [id number] DOB: 01/17/1941 Height: 5' 9" (175.3 cm) Weight: 72.1 kg              Insurance Information HMO: yes    PPO:      PCP:      IPA:      80/20:      OTHER:  PRIMARY: [employer] Medicare       Policy#: [id number]      Subscriber: Pt.  CM Name:    Phone#:      Fax#: [phone] Pre-Cert#: [id number] auth for CIR given by Hollie Salk with [facility] Medicare with updates due to fax listed above on 12/27     Employer:   Benefits:  Phone #:  [email or web address]    Name:  Eff. Date: 09/23/2019 - present     Deduct:   $0 (does not have deductible)   Out of Pocket Max: $3,600 ($364.35 met) CIR: $295/day co-pay for days 1-5, $0/day co-pay for days 6+ SNF: $0/day co-pay for days 1-20, $184/day co-pay for days 21-40, $0/day co-pay for days 41-100; limited to 100 days/cal yr Outpatient: $30/visit co-pay; limited by medical necessity Home Health: 100% coverage, 0% co-insurance; limited by medical necessity DME: 80% coverage, 20% co-insurance Providers: in network  SECONDARY: n/a   The "Data Collection Information Summary" for patients in Inpatient Rehabilitation Facilities with attached "Privacy Act [place] Records" was provided and verbally reviewed with: Patient and Family  Emergency Contact Information Contact Information    Name Relation Home Work [place], [place] Brother   [phone]   Laurian Brim Daughter   [phone]   [facility] Brother   [phone]   Jeanella Flattery Daughter   [phone]     Current Medical History  Patient Admitting Diagnosis: Thorac Myelopathy History of Present Illness: Gregory Bautista is a 79 y.o. 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  Tumor not felt to be resectable due to advanced disease and spine stabilization recommended with plans for chemo/XRT for treatment. He was evaluated by Dr. Arnoldo Morale and was admitted on 12/30/19 for T2-T4 laminectomy for tumor resection with T1-T5 posterior arthrodesis with pedicle screws and rods. Plans for 3 weeks of XRT by Dr. Marla Roe set for 12/27.  He has had issues with urinary retention requiring I/O caths  threfore foley placed yesterday.   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 CIR recommended due to functional decline.     Glasgow Coma Scale Score: 15  Past Medical History  Past Medical History:  Diagnosis Date  . Allergic rhinitis    NEC  . Asthma   . Glaucoma   . 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 Tobacco use disorder 09/24/2019    Family History  family history includes Cancer in his brother and mother; Heart attack in his father.  Prior Rehab/Hospitalizations:  Has the patient had prior rehab or hospitalizations prior to admission? No  Has the patient had major surgery during 100 days prior to admission? Yes  Current Medications   Current Facility-Administered Medications:  .  0.9 %  sodium  chloride infusion, 250 mL, Intravenous, Continuous, Bergman, Meghan D, NP, Stopped at 12/30/19 2241 .  acetaminophen (TYLENOL) tablet 650 mg, 650 mg, Oral, Q4H PRN, 650 mg at 01/11/20 2302 **OR** acetaminophen (TYLENOL) suppository 650 mg, 650 mg, Rectal, Q4H PRN, Bergman, Meghan D, NP .  albuterol (VENTOLIN HFA) 108 (90 Base) MCG/ACT inhaler 2 puff, 2 puff, Inhalation, Q6H PRN, Newman Pies, MD .  bisacodyl (DULCOLAX) EC tablet 5-10 mg, 5-10 mg, Oral, Daily PRN, Newman Pies, MD, 10 mg at 01/06/20 0901 .  bisacodyl (DULCOLAX) suppository 10 mg, 10 mg, Rectal, Daily PRN, Viona Gilmore D, NP .   Chlorhexidine Gluconate Cloth 2 % PADS 6 each, 6 each, Topical, Daily, Newman Pies, MD, 6 each at 01/11/20 1030 .  ciprofloxacin (CIPRO) tablet 500 mg, 500 mg, Oral, BID, Newman Pies, MD .  cyclobenzaprine (FLEXERIL) tablet 10 mg, 10 mg, Oral, TID PRN, Viona Gilmore D, NP, 10 mg at 01/11/20 2302 .  docusate sodium (COLACE) capsule 100 mg, 100 mg, Oral, BID, Bergman, Meghan D, NP, 100 mg at 01/11/20 2302 .  gabapentin (NEURONTIN) capsule 100 mg, 100 mg, Oral, TID, Newman Pies, MD, 100 mg at 01/11/20 2302 .  ipratropium (ATROVENT) 0.06 % nasal spray 2 spray, 2 spray, Each Nare, TID, Newman Pies, MD, 2 spray at 01/11/20 2304 .  latanoprost (XALATAN) 0.005 % ophthalmic solution 1 drop, 1 drop, Both Eyes, QHS, Newman Pies, MD, 1 drop at 01/11/20 2304 .  menthol-cetylpyridinium (CEPACOL) lozenge 3 mg, 1 lozenge, Oral, PRN **OR** phenol (CHLORASEPTIC) mouth spray 1 spray, 1 spray, Mouth/Throat, PRN, Bergman, Meghan D, NP .  mometasone-formoterol (DULERA) 200-5 MCG/ACT inhaler 2 puff, 2 puff, Inhalation, BID, Newman Pies, MD, 2 puff at 01/12/20 0856 .  morphine 4 MG/ML injection 4 mg, 4 mg, Intravenous, Q2H PRN, Bergman, Meghan D, NP .  multivitamin with minerals tablet 1 tablet, 1 tablet, Oral, Daily, Newman Pies, MD, 1 tablet at 01/11/20 1029 .  ondansetron (ZOFRAN) tablet 4 mg, 4 mg, Oral, Q6H PRN **OR** ondansetron (ZOFRAN) injection 4 mg, 4 mg, Intravenous, Q6H PRN, Bergman, Meghan D, NP .  oxyCODONE (Oxy IR/ROXICODONE) immediate release tablet 10 mg, 10 mg, Oral, Q3H PRN, Viona Gilmore D, NP, 10 mg at 01/08/20 2147 .  oxyCODONE (Oxy IR/ROXICODONE) immediate release tablet 5 mg, 5 mg, Oral, Q3H PRN, Viona Gilmore D, NP, 5 mg at 01/11/20 2302 .  polyethylene glycol (MIRALAX / GLYCOLAX) packet 17 g, 17 g, Oral, Daily, Newman Pies, MD, 17 g at 01/11/20 1030 .  senna-docusate (Senokot-S) tablet 1 tablet, 1 tablet, Oral, BID, Newman Pies, MD, 1 tablet at  01/11/20 2301 .  sodium chloride flush (NS) 0.9 % injection 3 mL, 3 mL, Intravenous, Q12H, Bergman, Meghan D, NP, 3 mL at 01/11/20 2306 .  sodium chloride flush (NS) 0.9 % injection 3 mL, 3 mL, Intravenous, PRN, Bergman, Meghan D, NP .  tamsulosin (FLOMAX) capsule 0.4 mg, 0.4 mg, Oral, QPC breakfast, Eustace Moore, MD, 0.4 mg at 01/12/20 5974 .  timolol (TIMOPTIC) 0.5 % ophthalmic solution 1 drop, 1 drop, Both Eyes, BID, Newman Pies, MD, 1 drop at 01/11/20 2302  Patients Current Diet:  Diet Order            Diet regular Room service appropriate? Yes; Fluid consistency: Thin  Diet effective now                 Precautions / Restrictions Precautions Precautions: Back,Fall Precaution Booklet Issued: Yes (comment) Precaution Comments: pt able to state all  precautions during session Restrictions Weight Bearing Restrictions: No   Has the patient had 2 or more falls or a fall with injury in the past year?No  Prior Activity Level Limited Community (1-2x/wk): active  Prior Functional Level Prior Function Level of Independence: Independent with assistive device(s) Comments: Use of rollator in home and community dwellings. Patient was Mod I with ADLs/IADLs.  Self Care: Did the patient need help bathing, dressing, using the toilet or eating?  Independent  Indoor Mobility: Did the patient need assistance with walking from room to room (with or without device)? Independent  Stairs: Did the patient need assistance with internal or external stairs (with or without device)? Independent  Functional Cognition: Did the patient need help planning regular tasks such as shopping or remembering to take medications? [place] / Equipment Home Assistive Devices/Equipment: Engineer, drilling (specify type),Cane (specify quad or straight) Home Equipment: Walker - 4 wheels,Cane - single point,Hand held shower head  Prior Device Use: Indicate devices/aids used by the  patient prior to current illness, exacerbation or injury? None of the above  Current Functional Level Cognition  Overall Cognitive Status: Within Functional Limits for tasks assessed Orientation Level: Oriented X4 General Comments: distracted by pain    Extremity Assessment (includes Sensation/Coordination)  Upper Extremity Assessment: Generalized weakness  Lower Extremity Assessment: Defer to PT evaluation RLE Deficits / Details: MMT scores of 4- to 4 grossly throughout RLE Sensation: WNL RLE Coordination: WNL LLE Deficits / Details: MMT scores of 4- to 4 grossly throughout LLE Sensation: WNL LLE Coordination: WNL    ADLs  Overall ADL's : Needs assistance/impaired Eating/Feeding: Set up,Sitting Grooming: Oral care,Sitting,Supervision/safety,Set up,Minimal assistance Grooming Details (indicate cue type and reason): MIN to complete task as pt reports pain in chest needing to lay down, washed off pts dentures d/t pain however pt able to complete oral care from EOB with set- up before increase pain Upper Body Dressing : Minimal assistance,Sitting Upper Body Dressing Details (indicate cue type and reason): to don gown as back side cover Lower Body Dressing: Minimal assistance,Sit to/from stand,Sitting/lateral leans Lower Body Dressing Details (indicate cue type and reason): Min A to don footwear with cues for back precautions. Toilet Transfer: RW,Min Psychiatric nurse Details (indicate cue type and reason): Increased time/effort. Patient reports morning stiffness. Toileting- Clothing Manipulation and Hygiene: Minimal assistance,Sit to/from stand Functional mobility during ADLs: Min guard (bed mobility only) General ADL Comments: pt limited by chest pain this session, able to transiton to EOB with min guard via log roll technique for seated oral care however returned pt to supine d/t increased HR and reports of "burning" chest pain    Mobility  Overal bed mobility: Needs  Assistance Bed Mobility: Supine to Sit Rolling: Min guard Sidelying to sit: Min guard Supine to sit: Supervision Sit to sidelying: Min assist General bed mobility comments: pt able to come safely to EOB without assist    Transfers  Overall transfer level: Needs assistance Equipment used: Rolling walker (2 wheeled) Transfers: Sit to/from Stand Sit to Stand: Min assist Stand pivot transfers: Min assist General transfer comment: mi A for power up with first stand but to steady for subsequent stands. Performed SPT with and without RW from bed to chair and chair to and from [facility]    Ambulation / Gait / Stairs / Wheelchair Mobility  Ambulation/Gait Ambulation/Gait assistance: Herbalist (Feet): 10 Feet Assistive device: Rolling walker (2 wheeled) Gait Pattern/deviations: Step-through pattern,Decreased stride length,Narrow base of support,Decreased stance time -  right,Decreased step length - left General Gait Details: gait distance limited by pt beginning to have BM so went to [facility]. Pt with UE and LE shaking during ambulation. Worked on progressing fwd and bkwd stepping. HR 115 bpm Gait velocity: decreased Gait velocity interpretation: <1.31 ft/sec, indicative of household ambulator    Posture / Balance Dynamic Sitting Balance Sitting balance - Comments: able to sit EOB for grooming tasks ~ 6 mins Balance Overall balance assessment: Needs assistance Sitting-balance support: No upper extremity supported,Feet supported Sitting balance-Leahy Scale: Good Sitting balance - Comments: able to sit EOB for grooming tasks ~ 6 mins Standing balance support: Bilateral upper extremity supported,During functional activity Standing balance-Leahy Scale: Poor Standing balance comment: Reliant on UE support on RW and min guard - minA for balance.    Special needs/care consideration Diabetic management no and Special service needs Pt. will need Chemo/rxt at d/c, Has foley currently      Previous Home Environment (from acute therapy documentation) Living Arrangements: Other relatives (brother) Available Help at Discharge: Available 24 hours/day,Family (Brother) Type of Home: House Home Layout: One level Home Access: Stairs to enter Entrance Stairs-Rails: Surveyor, mining of Steps: 4 Bathroom Shower/Tub: Paramedic: Yes Home Care Services: No Additional Comments: Patient is concerned that his brother will not be able to provide current level of assist necessary for completion of ADLs.  Discharge Living Setting Plans for Discharge Living Setting: Patient's home Type of Home at Discharge: House Discharge Home Layout: One level Discharge Home Access: Stairs to enter Entrance Stairs-Rails: Left,Right,Can reach both Entrance Stairs-Number of Steps: 3 Discharge Bathroom Shower/Tub: Tub/shower unit Discharge Bathroom Toilet: Standard Discharge Bathroom Accessibility: Yes How Accessible: Accessible via walker Does the patient have any problems obtaining your medications?: No  **As of 01/08/20, the patient's daughter [name], plans to have the patient live with her at DC (back in [place]); she will take FMLA and plans to be the 24/7 Supervision. Please give [name] as much heads up on the DC date as she will need to work on [employer] with her employer.   Social/Family/Support Systems Patient Roles: Other (Comment) has a daughter and brother up [name]: [name] [phone] Anticipated Caregiver: [name]  Anticipated Caregiver's Contact Information: [phone] Ability/Limitations of Caregiver: Can provide Supervision Caregiver Availability: 24/7 Discharge Plan Discussed with Primary Caregiver: Yes Is Caregiver In Agreement with Plan?: Yes Does Caregiver/Family have Issues with Lodging/Transportation while Pt is in Rehab?: No   Goals Patient/Family Goal for Rehab: PT/OT Mod I/Supervision;  SLP: NA Expected length of stay: 10-14 days Pt/Family Agrees to Admission and willing to participate: Yes Program Orientation Provided & Reviewed with Pt/Caregiver Including Roles  & Responsibilities: Yes  **SOCIAL WORK TEAM: Please follow up with Mont Dutton from [facility] about his scheduled PET Scan. They are trying to schedule it for Jan 3rd-Please contact her if it does not look like he will be DC'd by that date. They need at least a 3 day heads up to reschedule. Her phone is: [phone]).   Decrease burden of Care through IP rehab admission: Specialzed equipment needs, Decrease number of caregivers, Bowel and bladder program and Patient/family education   Possible need for SNF placement upon discharge:not anticipated   Patient Condition: This patient's medical and functional status has changed since the consult dated: 01/04/20 in which the Rehabilitation Physician determined and documented that the patient's condition is appropriate for intensive rehabilitative care in an inpatient rehabilitation facility. See "History of Present Illness" (above)  for medical update. Functional changes are: min assist x10'. Patient's medical and functional status update has been discussed with the Rehabilitation physician and patient remains appropriate for inpatient rehabilitation. Will admit to inpatient rehab today.  Preadmission Screen Completed By: Jhonnie Garner, OTR, with updates by Michel Santee, PT, 01/12/2020 9:51 AM    ______________________________________________________________________   Discussed status with Dr. Dagoberto Ligas on 01/12/20 at 9:52 AM  and received approval for admission today.  Admission Coordinator: Raechel Ache, time 9:52 AM Sudie Grumbling 01/12/20   With day of admit updates provided by: Shann Medal

## 2020-01-04 NOTE — Progress Notes (Signed)
Subjective: The patient is alert and pleasant.  He is interested in rehab.  His Foley catheter was replaced yesterday.  Objective: Vital signs in last 24 hours: Temp:  [97.6 F (36.4 C)-98.6 F (37 C)] 97.7 F (36.5 C) (12/13 1100) Pulse Rate:  [90-104] 104 (12/13 0710) Resp:  [15-18] 15 (12/13 0710) BP: (98-126)/(56-68) 126/68 (12/13 1100) SpO2:  [95 %-98 %] 96 % (12/13 0850) Estimated body mass index is 23.48 kg/m as calculated from the following:   Height as of this encounter: 5\' 9"  (1.753 m).   Weight as of this encounter: 72.1 kg.   Intake/Output from previous day: 12/12 0701 - 12/13 0700 In: 1481.2 [I.V.:981.2; IV Piggyback:500] Out: 400 [Urine:400] Intake/Output this shift: Total I/O In: 3 [I.V.:3] Out: -   Physical exam the patient is alert and pleasant.  His lower extremity strength is normal.  Lab Results: Recent Labs    01/03/20 1347  WBC 5.9  HGB 10.5*  HCT 32.1*  PLT 189   BMET Recent Labs    01/03/20 1347  NA 139  K 3.9  CL 102  CO2 28  GLUCOSE 67*  BUN 37*  CREATININE 1.54*  CALCIUM 9.7    Studies/Results: No results found.  Assessment/Plan: Postop day #5: I have encouraged the patient to mobilize with PT and OT.  We are working on rehab placement.  I will likely try to discontinue his Foley tomorrow.  I have answered all the patient's, and his brother's, questions.  LOS: 5 days     Gregory Bautista 01/04/2020, 1:14 PM

## 2020-01-04 NOTE — Progress Notes (Signed)
Inpatient Rehab Admissions Coordinator:   Met with patient at bedside to discuss potential CIR admission. Pt. Stated interest, is unsure he has support at discharge. I spoke with his brother, Elveria Royals, over the phone and he stated that he plans to come and stay with Pt. In his home following d/c and can provide 24/7 supervision and min A physical assistance if needed. . Will pursue for potential admit next week, pending bed availability and insurance auth.   Clemens Catholic, [place], [place] Admissions Coordinator  [phone] ([place]) [phone] (office)

## 2020-01-04 NOTE — Consult Note (Signed)
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 Arnoldo Morale   HPI: Gregory Bautista is a 79 y.o. 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  Tumor not felt to be resectable due to advanced disease and spine stabilization recommended with plans for chemo/XRT for treatment. He was evaluated by Dr. Arnoldo Morale and was admitted on 12/30/19 for T2-T4 laminectomy for tumor resection with T1-T5 posterior arthrodesis with pedicle screws and rods. Plans for 3 weeks of XRT by Dr. Marla Roe set for 12/27.  He has had issues with urinary retention requiring I/O caths  threfore foley placed yesterday.   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 CIR recommended due to functional decline.    Has been going down for the past three months with severe pain in the past month--"lying in bed and getting weaker and weaker".  Family from [place] has been assisting for the past 2 months--managing home/meals.    Review of Systems  Constitutional: Negative for chills and fever.  HENT: Positive for hearing loss.   Eyes: Positive for blurred vision (Right eye due to injury.  Left eye problems for 2-3 years). Negative for double vision.  Respiratory: Positive for shortness of breath. Negative for cough.   Cardiovascular: Positive for chest pain.  Gastrointestinal: Positive for constipation. Negative for heartburn and nausea.  Musculoskeletal: Positive for myalgias.  Skin: Negative for rash.  Neurological: Positive for tingling (bilateral feet X 1 year) and headaches  (occasionally ).      Past Medical History:  Diagnosis Date  . Allergic rhinitis    NEC  . Asthma   . Glaucoma   . 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 Tobacco use disorder 09/24/2019    Past Surgical History:  Procedure Laterality Date  . COLON SURGERY    . EYE SURGERY Right   . LAMINECTOMY WITH POSTERIOR LATERAL ARTHRODESIS LEVEL 4 N/A 12/30/2019   Procedure: THORACIC THREE LAMINECTOMY, THORACIC INSTRUMENTATION AND FUSION THORACIC ONE-FIVE;  Surgeon: Newman Pies, MD;  Location: [facility];  Service: Neurosurgery;  Laterality: N/A;    Family History  Problem Relation Age of Onset  . Cancer Mother   . Heart attack Father   . Cancer Brother      Social History:  Lives alone--family lives out of state. Brother from [place] here to help for a short time. 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 He reports that he quit smoking about 3 months ago. His smoking use included cigarettes--about on pack per day.  He started smoking about 64 years ago. He has a 30.00 pack-year smoking history. He has never used smokeless tobacco. He reports that he does not drink alcohol and does not use drugs.   Allergies  Allergen Reactions  . Penicillins Anaphylaxis    Has patient had a PCN reaction causing immediate rash, facial/tongue/throat swelling, SOB or lightheadedness with hypotension: Yes Has patient had a PCN reaction causing severe rash involving mucus membranes or skin necrosis: No Has patient had a PCN reaction that required hospitalization No Has patient had a PCN  reaction occurring within the last 10 years: Yes If all of the above answers are "NO", then may proceed with Cephalosporin use.   . Wellbutrin [Bupropion] Other (See Comments)    Seizures  . Morphine And Related Itching    Medications Prior to Admission  Medication Sig Dispense Refill  . albuterol (VENTOLIN HFA) 108 (90 Base) MCG/ACT inhaler Inhale 2 puffs into the lungs every 6 (six) hours as needed  for wheezing or shortness of breath.    [name] aspirin EC 81 MG tablet Take 81 mg by mouth daily. Swallow whole.    . cholecalciferol (VITAMIN D3) 25 MCG (1000 UNIT) tablet Take 1,000 Units by mouth daily.    . diclofenac Sodium (VOLTAREN) 1 % GEL Apply 1 application topically 4 (four) times daily as needed (pain).     [name] gabapentin (NEURONTIN) 100 MG capsule Take 100 mg by mouth 3 (three) times daily.    [name] ipratropium (ATROVENT) 0.06 % nasal spray Place 2 sprays into both nostrils daily as needed for rhinitis.    . Multiple Vitamin (MULTIVITAMIN WITH MINERALS) TABS tablet Take 1 tablet by mouth daily.    . SYMBICORT 160-4.5 MCG/ACT inhaler Inhale 1 puff into the lungs 2 (two) times daily as needed (shortness of breath).     . timolol (TIMOPTIC) 0.5 % ophthalmic solution Place 1 drop into both eyes 2 (two) times daily.     . Travoprost, BAK Free, (TRAVATAN) 0.004 % SOLN ophthalmic solution Place 1 drop into both eyes at bedtime.       Home: Home Living Family/patient expects to be discharged to:: Private residence Living Arrangements: Other relatives (brother) Available Help at Discharge: Available 24 hours/day,Family (Brother) Type of Home: House Home Access: Stairs to enter Technical brewer of Steps: 4 Entrance Stairs-Rails: [place]: One level Bathroom Shower/Tub: Engineer, petroleum: Standard Bathroom Accessibility: Yes Home Equipment: Walker - 4 wheels,Cane - single point,Hand held shower head Additional Comments: Patient is concerned that his brother will not be able to provide current level of assist necessary for completion of ADLs.  Functional History: Prior Function Level of Independence: Independent with assistive device(s) Comments: Use of rollator in home and community dwellings. Patient was Mod I with ADLs/IADLs. Functional Status:  Mobility: Bed Mobility Overal bed mobility: Needs Assistance Bed Mobility: Rolling,Sidelying to Sit,Sit  to Sidelying Rolling: Min guard Sidelying to sit: Min guard,HOB elevated Sit to sidelying: Mod assist,HOB elevated General bed mobility comments: Cues to log roll to maintain spinal precautions with success, min guard for safety. Cued pt to bring legs off bed while trunk ascends, with extra time and min guard to complete. Returning to sidelying from sit pt required modA to manage legs with cues to descend onto elbow as legs rise. Transfers Overall transfer level: Needs assistance Equipment used: Rolling walker (2 wheeled) Transfers: Sit to/from [employer] Sit to Stand: Min assist Stand pivot transfers: Min assist General transfer comment: Extra time and minA to steady pt with power up to stand. UE support on therapist to stand step bed <> commode this date with cues for sequencing steps. Ambulation/Gait Ambulation/Gait assistance: Min assist Gait Distance (Feet): 3 Feet (x2 bouts) Assistive device: Rolling walker (2 wheeled) Gait Pattern/deviations: Step-through pattern,Decreased stride length,Narrow base of support,Decreased stance time - right,Decreased step length - left General Gait Details: Several side steps with bilat UE support on therapist to transfer between surfaces, minA for steadying and cues to sequence steps. Knee buckling noted. Gait velocity: decreased Gait velocity  interpretation: <1.31 ft/sec, indicative of household ambulator    ADL: ADL Overall ADL's : Needs assistance/impaired Eating/Feeding: Set up,Sitting Grooming: Minimal assistance,Standing Upper Body Dressing : Set up,Sitting Lower Body Dressing: Minimal assistance,Sit to/from stand,Sitting/lateral leans Toilet Transfer: Minimal assistance,RW Toileting- Clothing Manipulation and Hygiene: Minimal assistance,Sit to/from stand Functional mobility during ADLs: Min guard,Rolling walker  Cognition: Cognition Overall Cognitive Status: Within Functional Limits for tasks assessed Orientation  Level: Oriented X4 Cognition Arousal/Alertness: Awake/alert Behavior During Therapy: WFL for tasks assessed/performed Overall Cognitive Status: Within Functional Limits for tasks assessed General Comments: A&Ox4. Pt aware of deficits.   Blood pressure (!) 98/56, pulse (!) 104, temperature 97.6 F (36.4 C), temperature source Axillary, resp. rate 15, height 5\' 9"  (1.753 m), weight 72.1 kg, SpO2 96 %. Physical Exam Vitals and nursing note reviewed.  Constitutional:      Appearance: Normal appearance. He is normal weight.     Interventions: Nasal cannula in place.     Comments: WNWD male, NAD. Soft mass right forehead--lipoma?  HENT:     Head: Normocephalic and atraumatic.  Eyes:     Extraocular Movements: Extraocular movements intact.     Conjunctiva/sclera: Conjunctivae normal.     Pupils: Pupils are equal, round, and reactive to light.  Cardiovascular:     Rate and Rhythm: Regular rhythm. Tachycardia present.     Heart sounds: Normal heart sounds. No murmur heard.   Pulmonary:     Effort: Pulmonary effort is normal. No respiratory distress.     Breath sounds: Normal breath sounds.  Abdominal:     General: Abdomen is flat. Bowel sounds are normal. There is no distension.     Palpations: Abdomen is soft.     Tenderness: There is no abdominal tenderness.  Skin:    General: Skin is warm and dry.  Neurological:     Mental Status: He is alert and oriented to person, place, and time.     Cranial Nerves: Cranial nerves are intact.     Sensory: Sensation is intact.     Coordination: Coordination is intact.     Comments: Normal light touch sensation in bilateral upper and lower limbs Normal coordination bilateral extremities Lower extremity strength 3 - bilateral hip flexors 4 bilateral knee extensors 3+ bilateral ankle dorsiflexors and plantar flexors  Psychiatric:        Mood and Affect: Mood normal.        Behavior: Behavior normal.     Results for orders placed or  performed during the hospital encounter of 12/30/19 (from the past 24 hour(s))  Comprehensive metabolic panel     Status: Abnormal   Collection Time: 01/03/20  1:47 PM  Result Value Ref Range   Sodium 139 135 - 145 mmol/L   Potassium 3.9 3.5 - 5.1 mmol/L   Chloride 102 98 - 111 mmol/L   CO2 28 22 - 32 mmol/L   Glucose, Bld 67 (L) 70 - 99 mg/dL   BUN 37 (H) 8 - 23 mg/dL   Creatinine, Ser 1.54 (H) 0.61 - 1.24 mg/dL   Calcium 9.7 8.9 - 10.3 mg/dL   Total Protein 5.6 (L) 6.5 - 8.1 g/dL   Albumin 2.2 (L) 3.5 - 5.0 g/dL   AST 37 15 - 41 U/L   ALT 29 0 - 44 U/L   Alkaline Phosphatase 68 38 - 126 U/L   Total Bilirubin 0.5 0.3 - 1.2 mg/dL   GFR, Estimated 46 (L) >60 mL/min   Anion gap 9 5 - 15  CBC     Status: Abnormal   Collection Time: 01/03/20  1:47 PM  Result Value Ref Range   WBC 5.9 4.0 - 10.5 K/uL   RBC 3.53 (L) 4.22 - 5.81 MIL/uL   Hemoglobin 10.5 (L) 13.0 - 17.0 g/dL   HCT 32.1 (L) 39.0 - 52.0 %   MCV 90.9 80.0 - 100.0 fL   MCH 29.7 26.0 - 34.0 pg   MCHC 32.7 30.0 - 36.0 g/dL   RDW 13.2 11.5 - 15.5 %   Platelets 189 150 - 400 K/uL   nRBC 0.0 0.0 - 0.2 %  Urinalysis, Routine w reflex microscopic Urine, Catheterized     Status: Abnormal   Collection Time: 01/03/20  6:37 PM  Result Value Ref Range   Color, Urine YELLOW YELLOW   APPearance HAZY (A) CLEAR   Specific Gravity, Urine 1.019 1.005 - 1.030   pH 5.0 5.0 - 8.0   Glucose, UA NEGATIVE NEGATIVE mg/dL   Hgb urine dipstick MODERATE (A) NEGATIVE   Bilirubin Urine NEGATIVE NEGATIVE   Ketones, ur NEGATIVE NEGATIVE mg/dL   Protein, ur 30 (A) NEGATIVE mg/dL   Nitrite NEGATIVE NEGATIVE   Leukocytes,Ua NEGATIVE NEGATIVE   RBC / HPF 11-20 0 - 5 RBC/hpf   WBC, UA 0-5 0 - 5 WBC/hpf   Bacteria, UA RARE (A) NONE SEEN   Squamous Epithelial / LPF 0-5 0 - 5   No results found.   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 Does the need for close, 24 hr/day  medical supervision in concert with the patient's rehab needs make it unreasonable for this patient to be served in a less intensive setting? Yes 2. Co-Morbidities requiring supervision/potential complications: Neurogenic bladder, Pancoast tumor requiring radiation therapy 3. Due to bladder management, bowel management, safety, skin/wound care, disease management, medication administration, pain management and patient education, does the patient require 24 hr/day rehab nursing? Yes 4. Does the patient require coordinated care of a physician, rehab nurse, therapy disciplines of PT, OT, speech to address physical and functional deficits in the context of the above medical diagnosis(es)? Yes Addressing deficits in the following areas: balance, endurance, locomotion, strength, transferring, bowel/bladder control, bathing, dressing, feeding, grooming, toileting and psychosocial support 5. Can the patient actively participate in an intensive therapy program of at least 3 hrs of therapy per day at least 5 days per week? Yes 6. The potential for patient to make measurable gains while on inpatient rehab is excellent 7. Anticipated functional outcomes upon discharge from inpatient rehab are modified independent and supervision  with PT, modified independent and supervision with OT, n/a with SLP. 8. Estimated rehab length of stay to reach the above functional goals is: 10-14d 9. Anticipated discharge destination: Home 10. Overall Rehab/Functional Prognosis: good  RECOMMENDATIONS: This patient's condition is appropriate for continued rehabilitative care in the following setting: CIR Patient has agreed to participate in recommended program. Yes Note that insurance prior authorization may be required for reimbursement for recommended care.  Comment: ? If brother can assist post d/c   Bary Leriche, PA-C 01/04/2020  "I have personally performed a face to face diagnostic evaluation of this patient.   Additionally, I have reviewed and concur with the physician assistant's documentation above." Charlett Blake M.D. [facility] FAAPM&R (Neuromuscular Med) Diplomate Am Board of Electrodiagnostic Med Fellow Am Board of Interventional Pain

## 2020-01-05 NOTE — Progress Notes (Signed)
OT Cancellation Note  Patient Details Name: Gregory Bautista MRN: [id number] DOB: August 30, 1940   Cancelled Treatment:    Reason Eval/Treat Not Completed: Other (comment) patient with another discipline at this time. OT will check back as time allows.   Gloris Manchester OTR/L Supplemental OT, Department of rehab services [phone]  Tobechukwu Emmick R H. 01/05/2020, 2:48 PM

## 2020-01-05 NOTE — Progress Notes (Signed)
Patient requested through out the day to keep foley cath. Due to decreased mobility and weakness.  Educated the patient on risk for infection and need for bladder trial without the catheter. Requesting removal in the early am.

## 2020-01-05 NOTE — Progress Notes (Signed)
Physical Therapy Treatment Patient Details Name: Gregory Bautista MRN: [id number] DOB: 09/29/1940 Today's Date: 01/05/2020    History of Present Illness Pt is a 79 year old male who presented with L-sided chest pain. 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 The pt elected to have the following procedure on 12/30/19: T3 laminectomy with thoracic instrumentation and posterior fusion T1-5 with resection of epidural spinal tumor. PMH significant for lung cancer, tobacco use, and asthma.    PT Comments    Pt supine in bed upon PT arrival with full lunch tray and he reports he has no appetite and cannot eat. Pt agreeable to therapy. Performed there ex in bed without O2 and pt desat to 87%. 2L O2 reapplied and remained rest of treatment. Pt reports neck, upper back, and shoulder pain that radiates into his axillary region bilaterally. Min A needed for sit to stand from bed and recliner. Pt ambulated 8' with RW and min A with sats dropping to 77%. Took seated rest break x3 mins and then ambulated 8' fwd and bkwd with sats into mid 80's with pursed lip breathing. Pt's brother present for treatment session. PT will continue to follow.    Follow Up Recommendations  Supervision for mobility/OOB;CIR     Equipment Recommendations  Rolling walker with 5" wheels    Recommendations for Other Services Rehab consult     Precautions / Restrictions Precautions Precautions: Back;Fall Restrictions Weight Bearing Restrictions: No    Mobility  Bed Mobility Overal bed mobility: Needs Assistance Bed Mobility: Supine to Sit     Supine to sit: Min guard     General bed mobility comments: vc's for sequencing and increased time needed  Transfers Overall transfer level: Needs assistance Equipment used: Rolling walker (2 wheeled) Transfers: Sit to/from [facility] Sit to Stand: Min assist Stand pivot  transfers: Min assist       General transfer comment: min A for power up, increased time needed, vc's for safe hand placement. Performed 4x for strengthening and practice from bed and recliner  Ambulation/Gait Ambulation/Gait assistance: Min assist Gait Distance (Feet): 24 Feet (8', 16') Assistive device: Rolling walker (2 wheeled) Gait Pattern/deviations: Step-through pattern;Decreased stride length;Narrow base of support;Decreased stance time - right;Decreased step length - left Gait velocity: decreased Gait velocity interpretation: <1.31 ft/sec, indicative of household ambulator General Gait Details: vc's for widening BOS. Ambulated fwd 8' 2x with seated rest in between and 8' bked one bout. No knee buckling but pt relayed feeling of LE weakness   Stairs             Wheelchair Mobility    Modified Rankin (Stroke Patients Only)       Balance Overall balance assessment: Needs assistance Sitting-balance support: No upper extremity supported;Feet supported Sitting balance-Leahy Scale: Good Sitting balance - Comments: Static sitting EOB no LOB, supervision for safety.   Standing balance support: Bilateral upper extremity supported;During functional activity Standing balance-Leahy Scale: Poor Standing balance comment: Reliant on UE support and minA for balance.                            Cognition Arousal/Alertness: Awake/alert Behavior During Therapy: WFL for tasks assessed/performed Overall Cognitive Status: Within Functional Limits for tasks assessed  General Comments: A&Ox4. Pt aware of deficits.      Exercises General Exercises - Lower Extremity Ankle Circles/Pumps: AROM;Both;10 reps;Supine Gluteal Sets:  (bridging with min buttocks clearance) Long Arc Quad: Both;Seated;5 reps Heel Slides: AROM;Both;10 reps;Supine Straight Leg Raises: Both;Supine;10 reps;AROM    General Comments General comments  (skin integrity, edema, etc.): Without supplemental O2, pt desat to 87% with supine ther ex. O2 replaced and used rest of session. Desat as low as 77% with ambulation with 3 mins to return to 90's. Pt on 2L O2 via [place]      Pertinent Vitals/Pain Pain Assessment: Faces Faces Pain Scale: Hurts little more Pain Location: upper back and shoulders and generalized Pain Descriptors / Indicators: Discomfort;Burning;Grimacing Pain Intervention(s): Limited activity within patient's tolerance;Monitored during session    Home Living                      Prior Function            PT Goals (current goals can now be found in the care plan section) Acute Rehab PT Goals Patient Stated Goal: to improve and go to CIR PT Goal Formulation: With patient Time For Goal Achievement: 01/14/20 Potential to Achieve Goals: Good Progress towards PT goals: Progressing toward goals    Frequency    Min 5X/week      PT Plan Current plan remains appropriate    Co-evaluation              AM-PAC PT "6 Clicks" Mobility   Outcome Measure  Help needed turning from your back to your side while in a flat bed without using bedrails?: A Little Help needed moving from lying on your back to sitting on the side of a flat bed without using bedrails?: A Little Help needed moving to and from a bed to a chair (including a wheelchair)?: A Little Help needed standing up from a chair using your arms (e.g., wheelchair or bedside chair)?: A Little Help needed to walk in hospital room?: A Little Help needed climbing 3-5 steps with a railing? : A Lot 6 Click Score: 17    End of Session Equipment Utilized During Treatment: Gait belt Activity Tolerance: Patient tolerated treatment well Patient left: with call bell/phone within reach;in chair;with chair alarm set;with family/visitor present Nurse Communication: Mobility status PT Visit Diagnosis: Unsteadiness on feet (R26.81);Other abnormalities of gait and  mobility (R26.89);Muscle weakness (generalized) (M62.81);Difficulty in walking, not elsewhere classified (R26.2);Pain Pain - Right/Left:  (back) Pain - part of body:  (back)     Time: 6720-9470 PT Time Calculation (min) (ACUTE ONLY): 42 min  Charges:  $Gait Training: 8-22 mins $Therapeutic Exercise: 8-22 mins $Therapeutic Activity: 8-22 mins                     [place]  Pager [phone] Office [place] 01/05/2020, 4:03 PM

## 2020-01-05 NOTE — Progress Notes (Signed)
Subjective: The patient is alert and pleasant.  He has no complaints.  Objective: Vital signs in last 24 hours: Temp:  [97.3 F (36.3 C)-98.6 F (37 C)] 98.4 F (36.9 C) (12/13 2051) Pulse Rate:  [91-110] 105 (12/13 2230) Resp:  [16-23] 16 (12/13 2051) BP: (108-146)/(62-89) 130/67 (12/13 2051) SpO2:  [93 %-99 %] 99 % (12/13 2142) Estimated body mass index is 23.48 kg/m as calculated from the following:   Height as of this encounter: 5\' 9"  (1.753 m).   Weight as of this encounter: 72.1 kg.   Intake/Output from previous day: 12/13 0701 - 12/14 0700 In: 1715.8 [P.O.:340; I.V.:1375.8] Out: 700 [Urine:700] Intake/Output this shift: Total I/O In: 386.2 [I.V.:386.2] Out: 450 [Urine:450]  Physical exam the patient is alert and oriented.  His strength is grossly normal.  Lab Results: Recent Labs    01/03/20 1347  WBC 5.9  HGB 10.5*  HCT 32.1*  PLT 189   BMET Recent Labs    01/03/20 1347  NA 139  K 3.9  CL 102  CO2 28  GLUCOSE 67*  BUN 37*  CREATININE 1.54*  CALCIUM 9.7    Studies/Results: No results found.  Assessment/Plan: Postop day #6: I encouraged the patient to eat and mobilize.  It looks like he will need rehab.  We are awaiting a bed.  Urinary retention: We will try to DC his Foley again today.  LOS: 6 days     Ophelia Charter 01/05/2020, 7:44 AM

## 2020-01-06 NOTE — Progress Notes (Signed)
Physical Therapy Treatment Patient Details Name: Gregory Bautista MRN: [id number] DOB: [birth year] Today's Date: 01/06/2020    History of Present Illness Pt is a 79 year old male who presented with L-sided chest pain. 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 The pt elected to have the following procedure on 12/30/19: T3 laminectomy with thoracic instrumentation and posterior fusion T1-5 with resection of epidural spinal tumor. PMH significant for lung cancer, tobacco use, and asthma.    PT Comments    Upon arrival, pt reports fatigue from already being up for several hours in the recliner this date and also from the events of the day. Pt requested to hold off on getting up to EOB or out of bed this date. Thus, focused session on improved lower extremity muscular strength and endurance through performing the following bilat leg exercises supine in bed: bridges, ankle pumps, SLR, SAQ, and isometric hip abduction/adduction. He demonstrated improved strength and endurance as he was able to perform 2 sets of each exercise with increased number of reps compared to previous sessions. However, he continues to display fatigue through decreased quality of exercise as reps increase. Will continue to follow acutely. Current recommendations remain appropriate.   Follow Up Recommendations  Supervision for mobility/OOB;CIR     Equipment Recommendations  Rolling walker with 5" wheels    Recommendations for Other Services Rehab consult     Precautions / Restrictions Precautions Precautions: Back;Fall Precaution Booklet Issued: Yes (comment) Precaution Comments: reviewed spinal precautions Restrictions Weight Bearing Restrictions: No    Mobility  Bed Mobility               General bed mobility comments: Transitioned superiorly in bed with modA. Pt declined out of bed this date.  Transfers                  General transfer comment: Pt declined out of bed this date  Ambulation/Gait             General Gait Details: Pt declined out of bed this date   Stairs             Wheelchair Mobility    Modified Rankin (Stroke Patients Only)       Balance       Sitting balance - Comments: Pt declined out of bed this date       Standing balance comment: Pt declined out of bed this date                            Cognition Arousal/Alertness: Awake/alert Behavior During Therapy: WFL for tasks assessed/performed Overall Cognitive Status: Within Functional Limits for tasks assessed                                        Exercises General Exercises - Lower Extremity Ankle Circles/Pumps: AROM;Both;15 reps;Supine (x2 sets) Gluteal Sets: Strengthening;Both;5 reps;Supine (x2 sets; bridging with min buttocks clearance to avoid weight through surgical site) Short Arc Quad: AROM;Strengthening;Both;15 reps;Supine (x2 sets) Hip ABduction/ADduction: Strengthening;Both;5 reps;Supine (isometrics against therapist's ahnds; x2 sets) Straight Leg Raises: Both;10 reps;Supine;Strengthening (x2 sets)    General Comments General comments (skin integrity, edema, etc.): Attempted to wean from 2.5L/min via [place] upon arrival to 1-2L/min throughout but would desat to 91-92% thus returned to 2.5L/min via  [place] by end of session      Pertinent Vitals/Pain Pain Assessment: Faces Faces Pain Scale: Hurts little more Pain Location: back Pain Descriptors / Indicators: Aching Pain Intervention(s): Limited activity within patient's tolerance;Monitored during session;Repositioned    Home Living                      Prior Function            PT Goals (current goals can now be found in the care plan section) Acute Rehab PT Goals Patient Stated Goal: to improve PT Goal Formulation: With patient Time For Goal Achievement: 01/14/20 Potential to Achieve Goals:  Good Progress towards PT goals: Progressing toward goals    Frequency    Min 5X/week      PT Plan Current plan remains appropriate    Co-evaluation              AM-PAC PT "6 Clicks" Mobility   Outcome Measure  Help needed turning from your back to your side while in a flat bed without using bedrails?: A Little Help needed moving from lying on your back to sitting on the side of a flat bed without using bedrails?: A Little Help needed moving to and from a bed to a chair (including a wheelchair)?: A Little Help needed standing up from a chair using your arms (e.g., wheelchair or bedside chair)?: A Little Help needed to walk in hospital room?: A Little Help needed climbing 3-5 steps with a railing? : A Lot 6 Click Score: 17    End of Session Equipment Utilized During Treatment: Oxygen Activity Tolerance: Patient tolerated treatment well Patient left: with call bell/phone within reach;in bed;with bed alarm set   PT Visit Diagnosis: Unsteadiness on feet (R26.81);Other abnormalities of gait and mobility (R26.89);Muscle weakness (generalized) (M62.81);Difficulty in walking, not elsewhere classified (R26.2);Pain Pain - Right/Left:  (back) Pain - part of body:  (back)     Time: 1735-1753 PT Time Calculation (min) (ACUTE ONLY): 18 min  Charges:  $Therapeutic Exercise: 8-22 mins                     Gregory Bautista, PT, DPT Acute Rehabilitation Services  Pager: [phone] Office: [phone]    Orvan Falconer 01/06/2020, 6:13 PM

## 2020-01-06 NOTE — Progress Notes (Signed)
Inpatient Rehabilitation-Admissions Coordinator   Was notified by the patient's insurance company that they have requested a peer to peer prior to making a determination for CIR. Have notified Dr. Arnoldo Morale of the request and he has agreed to complete by the deadline of 9AM tomorrow. Will continue to follow for peer to peer determination.   Raechel Ache, OTR/L  Rehab Admissions Coordinator  [phone] 01/06/2020 2:36 PM

## 2020-01-06 NOTE — Progress Notes (Signed)
Occupational Therapy Treatment Patient Details Name: Gregory Bautista MRN: [id number] DOB: 02-Jan-1941 Today's Date: 01/06/2020    History of present illness Pt is a 79 year old male who presented with L-sided chest pain. 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 The pt elected to have the following procedure on 12/30/19: T3 laminectomy with thoracic instrumentation and posterior fusion T1-5 with resection of epidural spinal tumor. PMH significant for lung cancer, tobacco use, and asthma.   OT comments  OT treatment session with focus on bed mobility, self-care re-education, and ADL transfers with adherence to back precautions. Upon initial eval, patient able to recall 3/3 back precautions. On this date, patient with difficulty recalling and adhering to precautions requiring multimodal cues. At start of session patient A&Ox4. Noted increased confusion throughout session with patient rubbing soap on hands and attempting to walk away from the sink without rinsing. OT also noticed food in patient's hand at end of session. When questioned, patient reports that he spit bacon into his hand. Patient did not indicate need to dispose of bacon. RN notified of what seems to be new onset confusion. Patient also acknowledge feeling more "fuzzy". Patient continues to be limited by pain, decreased standing balance, need for supplemental O2 and increased need for external assistance with ADL tasks. Patient would benefit from continued acute OT services in prep for safe d/c to next level of care.   Follow Up Recommendations  CIR    Equipment Recommendations  3 in 1 bedside commode;Tub/shower bench    Recommendations for Other Services Rehab consult    Precautions / Restrictions Precautions Precautions: Back;Fall Restrictions Weight Bearing Restrictions: No       Mobility Bed Mobility Overal bed mobility: Needs  Assistance Bed Mobility: Rolling;Sidelying to Sit Rolling: Min guard Sidelying to sit: Min guard;HOB elevated       General bed mobility comments: Cues for sequencing. Increased time/effort.  Transfers Overall transfer level: Needs assistance Equipment used: Rolling walker (2 wheeled) Transfers: Sit to/from [facility] Sit to Stand: Min assist Stand pivot transfers: Min assist       General transfer comment: Min A for sit to stand from EOB and from [facility] with cues for hand placement.    Balance Overall balance assessment: Needs assistance Sitting-balance support: No upper extremity supported;Feet supported Sitting balance-Leahy Scale: Good     Standing balance support: Bilateral upper extremity supported;During functional activity Standing balance-Leahy Scale: Poor Standing balance comment: Reliant on UE support and minA for balance.                           ADL either performed or assessed with clinical judgement   ADL Overall ADL's : Needs assistance/impaired                     Lower Body Dressing: Minimal assistance;Sit to/from stand;Sitting/lateral leans Lower Body Dressing Details (indicate cue type and reason): Min A to don footwear with cues for back precautions. Toilet Transfer: RW;Min Psychiatric nurse Details (indicate cue type and reason): Increased time/effort. Patient reports morning stiffness.                 Vision       Perception     Praxis      Cognition Arousal/Alertness: Awake/alert Behavior During Therapy: WFL for tasks assessed/performed Overall Cognitive Status: Within Functional Limits for tasks assessed  Exercises     Shoulder Instructions       General Comments SpO2 >90% on 2L via [place] througout session.    Pertinent Vitals/ Pain       Pain Assessment: 0-10 Pain Score: 5  Pain Location: L side Pain Descriptors / Indicators:  Burning;Constant Pain Intervention(s): Limited activity within patient's tolerance;Monitored during session;Premedicated before session  Home Living                                          Prior Functioning/Environment              Frequency  Min 2X/week        Progress Toward Goals  OT Goals(current goals can now be found in the care plan section)  Progress towards OT goals: Progressing toward goals  Acute Rehab OT Goals Patient Stated Goal: to improve and go to CIR OT Goal Formulation: With patient Time For Goal Achievement: 01/14/20 Potential to Achieve Goals: Good ADL Goals Pt Will Perform Grooming: with modified independence;standing Pt Will Perform Upper Body Dressing: with modified independence;sitting Pt Will Perform Lower Body Dressing: with modified independence;sit to/from stand Pt Will Transfer to Toilet: with modified independence;ambulating Pt Will Perform Toileting - Clothing Manipulation and hygiene: with modified independence;sit to/from stand Pt Will Perform Tub/Shower Transfer: Tub transfer;tub bench;grab bars;rolling walker Additional ADL Goal #1: Patient will recall 3/3 back precautions with good return demonstration and no cueing during functional activity.  Plan Discharge plan remains appropriate    Co-evaluation                 AM-PAC OT "6 Clicks" Daily Activity     Outcome Measure   Help from another person eating meals?: None Help from another person taking care of personal grooming?: A Little Help from another person toileting, which includes using toliet, bedpan, or urinal?: A Little Help from another person bathing (including washing, rinsing, drying)?: A Little Help from another person to put on and taking off regular upper body clothing?: A Little Help from another person to put on and taking off regular lower body clothing?: A Little 6 Click Score: 19    End of Session Equipment Utilized During Treatment:  Gait belt;Rolling walker  OT Visit Diagnosis: Unsteadiness on feet (R26.81);Muscle weakness (generalized) (M62.81);History of falling (Z91.81);Pain Pain - Right/Left: Left Pain - part of body:  (Flank)   Activity Tolerance Patient tolerated treatment well   Patient Left in chair;with call bell/phone within reach;with chair alarm set   Nurse Communication          Time:  -     Charges: OT General Charges $OT Visit: 1 Visit OT Treatments $Self Care/Home Management : 23-37 mins  Elsa Ploch H. OTR/L Supplemental OT, Department of rehab services [phone]   Coda Filler R H. 01/06/2020, 10:47 AM

## 2020-01-06 NOTE — Progress Notes (Signed)
Subjective: The patient is alert and pleasant.  He is awaiting rehab placement.  He walked yesterday.  Objective: Vital signs in last 24 hours: Temp:  [98.1 F (36.7 C)-98.8 F (37.1 C)] 98.4 F (36.9 C) (12/15 0855) Pulse Rate:  [88-114] 103 (12/15 0855) Resp:  [18-23] 18 (12/15 0855) BP: (111-132)/(64-79) 111/77 (12/15 0855) SpO2:  [94 %-98 %] 96 % (12/15 0855) Estimated body mass index is 23.48 kg/m as calculated from the following:   Height as of this encounter: 5\' 9"  (1.753 m).   Weight as of this encounter: 72.1 kg.   Intake/Output from previous day: 12/14 0701 - 12/15 0700 In: 1037.2 [P.O.:651; I.V.:386.2] Out: 1225 [Urine:1225] Intake/Output this shift: No intake/output data recorded.  Physical exam patient is alert and oriented.  His lower extremity strength is grossly normal.  His thoracic incision is healing well.  Lab Results: Recent Labs    01/03/20 1347  WBC 5.9  HGB 10.5*  HCT 32.1*  PLT 189   BMET Recent Labs    01/03/20 1347  NA 139  K 3.9  CL 102  CO2 28  GLUCOSE 67*  BUN 37*  CREATININE 1.54*  CALCIUM 9.7    Studies/Results: No results found.  Assessment/Plan: Postop day #7: I encouraged the patient to continue to mobilize.  We will DC his Foley catheter and await rehab placement.  LOS: 7 days     Ophelia Charter 01/06/2020, 9:13 AM

## 2020-01-06 NOTE — Plan of Care (Signed)
Foley will be d/c'ed in the AM. Pt didn't c/o pain but HR stayed ST in the 116. No other concerns.   Problem: Education: Goal: Required Educational Video(s) Outcome: Progressing   Problem: Clinical Measurements: Goal: Postoperative complications will be avoided or minimized Outcome: Progressing   Problem: Skin Integrity: Goal: Demonstration of wound healing without infection will improve Outcome: Progressing   Problem: Education: Goal: Knowledge of General Education information will improve Description: Including pain rating scale, medication(s)/side effects and non-pharmacologic comfort measures Outcome: Progressing   Problem: Health Behavior/Discharge Planning: Goal: Ability to manage health-related needs will improve Outcome: Progressing   Problem: Clinical Measurements: Goal: Ability to maintain clinical measurements within normal limits will improve Outcome: Progressing Goal: Will remain free from infection Outcome: Progressing Goal: Diagnostic test results will improve Outcome: Progressing Goal: Respiratory complications will improve Outcome: Progressing Goal: Cardiovascular complication will be avoided Outcome: Progressing   Problem: Activity: Goal: Risk for activity intolerance will decrease Outcome: Progressing   Problem: Nutrition: Goal: Adequate nutrition will be maintained Outcome: Progressing   Problem: Coping: Goal: Level of anxiety will decrease Outcome: Progressing   Problem: Elimination: Goal: Will not experience complications related to bowel motility Outcome: Progressing Goal: Will not experience complications related to urinary retention Outcome: Progressing   Problem: Pain Managment: Goal: General experience of comfort will improve Outcome: Progressing   Problem: Safety: Goal: Ability to remain free from injury will improve Outcome: Progressing   Problem: Skin Integrity: Goal: Risk for impaired skin integrity will decrease Outcome:  Progressing

## 2020-01-06 NOTE — Progress Notes (Signed)
Pt foley removed per order, perineal care completed. Pt due to void. Will continue to closely monitor pt. Delia Heady RN

## 2020-01-07 ENCOUNTER — Ambulatory Visit (HOSPITAL_COMMUNITY): Payer: Medicare Other

## 2020-01-07 MED ORDER — POLYETHYLENE GLYCOL 3350 17 G PO PACK
17.0000 g | PACK | Freq: Every day | ORAL | Status: DC
  Administered 2020-01-07 – 2020-01-12 (×6): 17 g via ORAL
  Filled 2020-01-07 (×6): qty 1

## 2020-01-07 NOTE — NC FL2 (Addendum)
[place] LEVEL OF CARE SCREENING TOOL     IDENTIFICATION  Patient Name: Gregory Bautista: 06/19/1940 Sex: male Admission Date (Current Location): 12/30/2019  [facility] and [place] Number:  Herbalist and Address:  The [place], [place] [address] [place], [place] [address]      Provider Number: [id number]  Attending Physician Name and Address:  Newman Pies, MD  Relative Name and Phone Number:       Current Level of Care: Hospital Recommended Level of Care: [place] Prior Approval Number:    Date Approved/Denied:   PASRR Number:   [id number]  Discharge Plan: SNF    Current Diagnoses: Patient Active Problem List   Diagnosis Date Noted  . 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 Mass of upper lobe of left lung 12/24/2019  . Tobacco use disorder 09/24/2019    Orientation RESPIRATION BLADDER Height & Weight     Self,Time,Situation,Place  O2 (Nasal cannula 2L) Continent,Indwelling catheter Weight: 72.1 kg Height:  5\' 9"  (175.3 cm)  BEHAVIORAL SYMPTOMS/MOOD NEUROLOGICAL BOWEL NUTRITION STATUS      Continent Diet (Please see DC summary)  AMBULATORY STATUS COMMUNICATION OF NEEDS Skin   Limited Assist Verbally Surgical wounds (Closed incision on back)                       Personal Care Assistance Level of Assistance  Bathing,Feeding,Dressing Bathing Assistance: Limited assistance Feeding assistance: Independent Dressing Assistance: Limited assistance     Functional Limitations Info             SPECIAL CARE FACTORS FREQUENCY  PT (By licensed PT),OT (By licensed OT)     PT Frequency: 5x/week OT Frequency: 5x/week            Contractures Contractures Info: Not present    Additional Factors Info  Code Status,Allergies Code Status Info: Full Allergies Info: Penicillins, Wellbutrin (Bupropion), Morphine And Related            Current Medications (01/07/2020):  This is the current hospital active medication list Current Facility-Administered Medications  Medication Dose Route Frequency Provider Last Rate Last Admin  . 0.9 %  sodium chloride infusion  250 mL Intravenous Continuous Viona Gilmore D, NP   Stopped at 12/30/19 2241  . acetaminophen (TYLENOL) tablet 650 mg  650 mg Oral Q4H PRN Viona Gilmore D, NP   650 mg at 01/06/20 1617   Or  . acetaminophen (TYLENOL) suppository 650 mg  650 mg Rectal Q4H PRN Bergman, Meghan D, NP      . albuterol (VENTOLIN HFA) 108 (90 Base) MCG/ACT inhaler 2 puff  2 puff Inhalation Q6H PRN Newman Pies, MD      . bisacodyl (DULCOLAX) EC tablet 5-10 mg  5-10 mg Oral Daily PRN Newman Pies, MD   10 mg at 01/06/20 0901  . bisacodyl (DULCOLAX) suppository 10 mg  10 mg Rectal Daily PRN Viona Gilmore D, NP      . Chlorhexidine Gluconate Cloth 2 % PADS 6 each  6 each Topical Daily Newman Pies, MD   6 each at 01/07/20 1048  . cyclobenzaprine (FLEXERIL) tablet 10 mg  10 mg Oral TID PRN Viona Gilmore D, NP   10 mg at 01/06/20 1616  . docusate sodium (COLACE) capsule 100 mg  100 mg Oral BID Viona Gilmore D, NP   100 mg at 01/07/20 1042  . gabapentin (NEURONTIN) capsule  100 mg  100 mg Oral TID Newman Pies, MD   100 mg at 01/07/20 1042  . ipratropium (ATROVENT) 0.06 % nasal spray 2 spray  2 spray Each Nare TID Newman Pies, MD   2 spray at 01/07/20 1046  . latanoprost (XALATAN) 0.005 % ophthalmic solution 1 drop  1 drop Both Eyes QHS Newman Pies, MD   1 drop at 01/06/20 2130  . menthol-cetylpyridinium (CEPACOL) lozenge 3 mg  1 lozenge Oral PRN Bergman, Meghan D, NP       Or  . phenol (CHLORASEPTIC) mouth spray 1 spray  1 spray Mouth/Throat PRN Viona Gilmore D, NP      . mometasone-formoterol (DULERA) 200-5 MCG/ACT inhaler 2 puff  2 puff Inhalation BID Newman Pies, MD   2 puff at 01/07/20 0849  . morphine 4 MG/ML injection 4 mg  4 mg Intravenous Q2H PRN  Viona Gilmore D, NP      . multivitamin with minerals tablet 1 tablet  1 tablet Oral Daily Newman Pies, MD   1 tablet at 01/07/20 1042  . ondansetron (ZOFRAN) tablet 4 mg  4 mg Oral Q6H PRN Viona Gilmore D, NP       Or  . ondansetron (ZOFRAN) injection 4 mg  4 mg Intravenous Q6H PRN Bergman, Meghan D, NP      . oxyCODONE (Oxy IR/ROXICODONE) immediate release tablet 10 mg  10 mg Oral Q3H PRN Viona Gilmore D, NP   10 mg at 01/01/20 1254  . oxyCODONE (Oxy IR/ROXICODONE) immediate release tablet 5 mg  5 mg Oral Q3H PRN Viona Gilmore D, NP   5 mg at 01/06/20 2158  . polyethylene glycol (MIRALAX / GLYCOLAX) packet 17 g  17 g Oral Daily Newman Pies, MD   17 g at 01/07/20 1042  . sodium chloride flush (NS) 0.9 % injection 3 mL  3 mL Intravenous Q12H Bergman, Meghan D, NP   3 mL at 01/07/20 1045  . sodium chloride flush (NS) 0.9 % injection 3 mL  3 mL Intravenous PRN Viona Gilmore D, NP      . tamsulosin (FLOMAX) capsule 0.4 mg  0.4 mg Oral QPC breakfast Eustace Moore, MD   0.4 mg at 01/07/20 1042  . timolol (TIMOPTIC) 0.5 % ophthalmic solution 1 drop  1 drop Both Eyes BID Newman Pies, MD   1 drop at 01/07/20 1047     Discharge Medications: Please see discharge summary for a list of discharge medications.  Relevant Imaging Results:  Relevant Lab Results:   Additional Information SSN: [id number]. Received 2 COVID vaccines  Pollie Friar, RN

## 2020-01-07 NOTE — Progress Notes (Signed)
I called the peer-to-peer line regarding the patient's rehab placement.  They denied rehab coverage.  We will need to work on skilled nursing facility placement.  I spoke with the patient's brother and updated him on the patient condition and the plan.

## 2020-01-07 NOTE — Progress Notes (Signed)
Urinary catheter was d/c at 1648 pt unable to void on own Bladder scan done at 0000 noted 410 mls on scan pt assisted to void in commode and urinal but unable to void. Bladder scan repeated at 0500 noted 505 on bladder. Srtiaght catheterization done 510 drained out. Post residual  Scan noted only 12 cc. Pt  Ambulated to the door entrance, c/o fatigue assisted back to [facility] then transferred back to bed  Pt unable to void on his own.

## 2020-01-07 NOTE — TOC Progression Note (Signed)
Transition of Care [facility]) - Progression Note    Patient Details  Name: Gregory Bautista MRN: [id number] Date of Birth: 02-Oct-1940  [facility] CM/SW Contact  Pollie Friar, RN Phone Number: 01/07/2020, 3:46 PM  Clinical Narrative:    Pt has been denied for CIR. CM met with the patient and he is in agreement with SNF rehab and being faxed out in the [facility] area.  TOC will provide him bed offers in the am.   Expected Discharge Plan: [place] Barriers to Discharge: Continued Medical Work up  Expected Discharge Plan and Services Expected Discharge Plan: [place]   Discharge Planning Services: CM Consult Post Acute Care Choice: IP Rehab                                         Social Determinants of Health (SDOH) Interventions    Readmission Risk Interventions No flowsheet data found.

## 2020-01-07 NOTE — Progress Notes (Signed)
Physical Therapy Treatment Patient Details Name: Gregory Bautista MRN: [id number] DOB: 23-Aug-1940 Today's Date: 01/07/2020    History of Present Illness Pt is a 79 year old male who presented with L-sided chest pain. 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 The pt elected to have the following procedure on 12/30/19: T3 laminectomy with thoracic instrumentation and posterior fusion T1-5 with resection of epidural spinal tumor. PMH significant for lung cancer, tobacco use, and asthma.    PT Comments    Pt demonstrating progress towards goals slowly. Able to recall 2/3 spinal precautions without cues and 3rd with minor cues. Pt requires min guard assist and extra time with cues for bed mob due to weakness and needing assistance to sequence steps. Pt continues to demonstrate quads and gluts weakness in bilat legs through requiring minA to power up to stand from lower surfaces and through noted knee buckling and trunk flexion during standing/gait. He was able to ambulate x2 bouts of ~14 ft > ~25 ft with a RW and minA with tactile and verbal cues to improve knee stability during stance. Will continue to follow acutely. Current recommendations remain appropriate.  Follow Up Recommendations  Supervision for mobility/OOB;CIR     Equipment Recommendations  Rolling walker with 5" wheels    Recommendations for Other Services Rehab consult     Precautions / Restrictions Precautions Precautions: Back;Fall Precaution Booklet Issued: Yes (comment) Precaution Comments: reviewed spinal precautions, pt able to recall no bending and no twisting, required cues to remember no lifting Restrictions Weight Bearing Restrictions: No    Mobility  Bed Mobility Overal bed mobility: Needs Assistance Bed Mobility: Rolling;Sidelying to Sit Rolling: Min guard Sidelying to sit: Min guard;HOB elevated       General bed mobility  comments: Cues to log roll to maintain spinal precautions with success, min guard for safety. Cued pt to bring legs off bed while trunk ascends, with extra time and min guard to complete.  Transfers Overall transfer level: Needs assistance Equipment used: Rolling walker (2 wheeled) Transfers: Sit to/from Stand Sit to Stand: Min assist         General transfer comment: Pt able to come to stand from bed pushing up from bed and from toilet with use of grab bar on R with minA for steadying and to power up to stand from lower surfaces.  Ambulation/Gait Ambulation/Gait assistance: Min assist Gait Distance (Feet): 25 Feet (x2 bouts of ~14 ft > ~25) Assistive device: Rolling walker (2 wheeled) Gait Pattern/deviations: Step-through pattern;Decreased stride length;Narrow base of support;Decreased stance time - right;Decreased step length - left Gait velocity: decreased Gait velocity interpretation: <1.31 ft/sec, indicative of household ambulator General Gait Details: Ambulates with narrow BOS and knee buckling, increased shaking through arms this date. Provided tactile cues at quads to encourage knee extension during stance bilat with success. Pt requiring cues to manage RW during turns and in tight spaces, bumping obstacles in tight spaces. MinA for steadying. Cues to inc step length, with mod success.   Stairs             Wheelchair Mobility    Modified Rankin (Stroke Patients Only)       Balance Overall balance assessment: Needs assistance Sitting-balance support: No upper extremity supported;Feet supported Sitting balance-Leahy Scale: Good Sitting balance - Comments: Static sitting EOB no LOB, supervision for safety.   Standing balance support: Bilateral upper extremity supported;During functional activity Standing balance-Leahy Scale:  Poor Standing balance comment: Reliant on UE support on RW and min guard - minA for balance.                             Cognition Arousal/Alertness: Awake/alert Behavior During Therapy: WFL for tasks assessed/performed Overall Cognitive Status: Within Functional Limits for tasks assessed                                        Exercises      General Comments        Pertinent Vitals/Pain Pain Assessment: 0-10 Pain Score: 6  Pain Location: operative site in back and shoulders Pain Descriptors / Indicators: Cramping;Burning Pain Intervention(s): Limited activity within patient's tolerance;Monitored during session;Repositioned    Home Living                      Prior Function            PT Goals (current goals can now be found in the care plan section) Acute Rehab PT Goals Patient Stated Goal: to improve and go to CIR PT Goal Formulation: With patient Time For Goal Achievement: 01/14/20 Potential to Achieve Goals: Good Progress towards PT goals: Progressing toward goals    Frequency    Min 5X/week      PT Plan Current plan remains appropriate    Co-evaluation              AM-PAC PT "6 Clicks" Mobility   Outcome Measure  Help needed turning from your back to your side while in a flat bed without using bedrails?: A Little Help needed moving from lying on your back to sitting on the side of a flat bed without using bedrails?: A Little Help needed moving to and from a bed to a chair (including a wheelchair)?: A Little Help needed standing up from a chair using your arms (e.g., wheelchair or bedside chair)?: A Little Help needed to walk in hospital room?: A Little Help needed climbing 3-5 steps with a railing? : A Lot 6 Click Score: 17    End of Session Equipment Utilized During Treatment: Gait belt;Oxygen (2-3L/min via [place]) Activity Tolerance: Patient tolerated treatment well Patient left: in chair;with call bell/phone within reach;with chair alarm set Nurse Communication: Mobility status;Other (comment) (O2 and pain at anus) PT Visit Diagnosis:  Unsteadiness on feet (R26.81);Other abnormalities of gait and mobility (R26.89);Muscle weakness (generalized) (M62.81);Difficulty in walking, not elsewhere classified (R26.2);Pain Pain - Right/Left:  (back) Pain - part of body:  (back)     Time: 6962-9528 PT Time Calculation (min) (ACUTE ONLY): 35 min  Charges:  $Gait Training: 8-22 mins $Therapeutic Activity: 8-22 mins                     Moishe Spice, PT, DPT Acute Rehabilitation Services  Pager: [phone] Office: [place] 01/07/2020, 4:00 PM

## 2020-01-07 NOTE — Progress Notes (Signed)
Subjective: The patient is alert and pleasant.  He asked me to call his brother.  We are awaiting rehab placement.  He has complained of constipation, but he had a bowel movement this morning.  Objective: Vital signs in last 24 hours: Temp:  [98 F (36.7 C)-99.4 F (37.4 C)] 98.4 F (36.9 C) (12/16 1100) Pulse Rate:  [29-109] 29 (12/16 1100) Resp:  [16-18] 16 (12/16 1100) BP: (104-126)/(63-81) 124/70 (12/16 1100) SpO2:  [96 %-100 %] 100 % (12/16 1100) FiO2 (%):  [98 %] 98 % (12/15 1939) Estimated body mass index is 23.48 kg/m as calculated from the following:   Height as of this encounter: 5\' 9"  (1.753 m).   Weight as of this encounter: 72.1 kg.   Intake/Output from previous day: 12/15 0701 - 12/16 0700 In: 800 [P.O.:800] Out: 1044 [Urine:1044] Intake/Output this shift: No intake/output data recorded.  Physical exam the patient is alert and pleasant.  His lower extremity strength is grossly normal.  Lab Results: No results for input(s): WBC, HGB, HCT, PLT in the last 72 hours. BMET No results for input(s): NA, K, CL, CO2, GLUCOSE, BUN, CREATININE, CALCIUM in the last 72 hours.  Studies/Results: No results found.  Assessment/Plan: Postop day #8: We are awaiting rehab placement.  Urinary retention: He had to be catheterized this morning.  LOS: 8 days     Ophelia Charter 01/07/2020, 12:44 PM

## 2020-01-07 NOTE — Progress Notes (Signed)
Inpatient Rehabilitation-Admissions Coordinator   Was notified by the patient's insurance company that his request for CIR has been denied. Notified the pt and his brother and they are open to SNF placement. AC has notified TOC team of denial and need for new placement.   AC will sign off.   Raechel Ache, OTR/L  Rehab Admissions Coordinator  [phone] 01/07/2020 2:09 PM

## 2020-01-07 NOTE — Plan of Care (Signed)
  Problem: Skin Integrity: Goal: Demonstration of wound healing without infection will improve Outcome: Progressing   Problem: Skin Integrity: Goal: Demonstration of wound healing without infection will improve Outcome: Progressing   Problem: Clinical Measurements: Goal: Postoperative complications will be avoided or minimized Outcome: Progressing

## 2020-01-07 NOTE — Plan of Care (Signed)
  Problem: Education: Goal: Required Educational Video(s) Outcome: Progressing   Problem: Clinical Measurements: Goal: Ability to maintain clinical measurements within normal limits will improve Outcome: Progressing Goal: Will remain free from infection Outcome: Progressing Goal: Diagnostic test results will improve Outcome: Progressing Goal: Respiratory complications will improve Outcome: Progressing Goal: Cardiovascular complication will be avoided Outcome: Progressing   Problem: Safety: Goal: Ability to remain free from injury will improve Outcome: Progressing

## 2020-01-08 ENCOUNTER — Other Ambulatory Visit: Payer: Self-pay | Admitting: Physician Assistant

## 2020-01-08 DIAGNOSIS — R918 Other nonspecific abnormal finding of lung field: Secondary | ICD-10-CM

## 2020-01-08 LAB — URINALYSIS, ROUTINE W REFLEX MICROSCOPIC
Bilirubin Urine: NEGATIVE
Glucose, UA: NEGATIVE mg/dL
Hgb urine dipstick: NEGATIVE
Ketones, ur: NEGATIVE mg/dL
Nitrite: NEGATIVE
Protein, ur: >=300 mg/dL — AB
Specific Gravity, Urine: 1.015 (ref 1.005–1.030)
pH: 8 (ref 5.0–8.0)

## 2020-01-08 NOTE — Progress Notes (Signed)
Inpatient Rehabilitation-Admissions Coordinator   Was notified by [facility] team the patient would like to do an expedited appeal. AC will initiate appeal on his behalf.   Will update once there has been a determination.   Raechel Ache, OTR/L  Rehab Admissions Coordinator  [phone] 01/08/2020 1:58 PM

## 2020-01-08 NOTE — Progress Notes (Signed)
   Providing Compassionate, Quality Care - Together   Subjective: Patient reports no issues overnight. He is ready to move forward with rehabilitation.  Objective: Vital signs in last 24 hours: Temp:  [98.1 F (36.7 C)-98.8 F (37.1 C)] 98.4 F (36.9 C) (12/17 0907) Pulse Rate:  [98-110] 107 (12/17 0907) Resp:  [16-20] 16 (12/17 0907) BP: (106-127)/(64-74) 120/74 (12/17 0907) SpO2:  [96 %-100 %] 98 % (12/17 0907)  Intake/Output from previous day: 12/16 0701 - 12/17 0700 In: 240 [P.O.:240] Out: -  Intake/Output this shift: Total I/O In: 400 [P.O.:400] Out: 675 [Urine:675]  Alert and oriented x 4 PERRLA CN II-XII intact MAE, Strength is grossly normal   Lab Results: No results for input(s): WBC, HGB, HCT, PLT in the last 72 hours. BMET No results for input(s): NA, K, CL, CO2, GLUCOSE, BUN, CREATININE, CALCIUM in the last 72 hours.  Studies/Results: No results found.  Assessment/Plan: Patient is 9 days status post resection of epidural spinal tumor by Dr. Arnoldo Morale. He is awaiting placement for SNF. He is still experiencing issues with urinary retention. He is already on Flomax.   LOS: 9 days       Viona Gilmore, DNP, AGNP-C Nurse Practitioner  [facility] [place] [address], [address], [place], [place] [address] P: [phone]    F: [phone]  01/08/2020, 11:23 AM

## 2020-01-08 NOTE — TOC Progression Note (Signed)
Transition of Care [facility]) - Progression Note    Patient Details  Name: Burle Kwan MRN: [id number] Date of Birth: 09/04/1940  Transition of Care [facility]) CM/SW Contact  Pollie Friar, RN Phone Number: 01/08/2020, 2:25 PM  Clinical Narrative:    Patient and his daughter, [name] have decided to appeal the denial through [facility] for CIR.  CM has emailed [name] a list of the SNF that have offered and are pending for rehab incase the denial is not overturned.  CIR is submitting the appeals information and we wait to hear the result. TOC following.   Expected Discharge Plan: IP Rehab Facility Barriers to Discharge: Continued Medical Work up  Expected Discharge Plan and Services Expected Discharge Plan: [place]   Discharge Planning Services: CM Consult Post Acute Care Choice: IP Rehab                                         Social Determinants of Health (SDOH) Interventions    Readmission Risk Interventions No flowsheet data found.

## 2020-01-08 NOTE — Progress Notes (Signed)
Pt OOB to bedside with RN in attempt to void but unsuccessful. Pt report feeling some pressure the urge but still couldn't void. Pt bladder scanned at 0600 for 462 and I&O cath for 647ml. Peri-care done prior to and after cath. Pt report relief after cath and reported off to oncoming RN. Delia Heady RN

## 2020-01-08 NOTE — Progress Notes (Signed)
Physical Therapy Treatment Patient Details Name: Gregory Bautista MRN: [id number] DOB: 1940-05-21 Today's Date: 01/08/2020    History of Present Illness Pt is a 79 year old male who presented with L-sided chest pain. 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 The pt elected to have the following procedure on 12/30/19: T3 laminectomy with thoracic instrumentation and posterior fusion T1-5 with resection of epidural spinal tumor. PMH significant for lung cancer, tobacco use, and asthma.    PT Comments    Pt continues to display bilat quads and glut weakness as he continues to display knee instability and a flexed trunk during gait with pt appearing to be nervous with gait as his arms were shaking. Provided verbal and tactile cues to sequence management of RW between steps and esp during turns, with good carryover this date. Cues provided at bilat quads during stance to improve knee stability. Pt able to increase gait distance to ~30 ft this date with a RW and minA. Pt reported burning pain in L chest at end of gait bout but it decreased in a few minutes to almost gone by time PT left room, notified RN. Will continue to follow acutely. Current recommendations remain appropriate.  Follow Up Recommendations  Supervision for mobility/OOB;CIR     Equipment Recommendations  Rolling walker with 5" wheels    Recommendations for Other Services       Precautions / Restrictions Precautions Precautions: Back;Fall Precaution Booklet Issued: Yes (comment) Precaution Comments: reviewed spinal precautions Restrictions Weight Bearing Restrictions: No    Mobility  Bed Mobility Overal bed mobility: Needs Assistance Bed Mobility: Rolling;Sidelying to Sit Rolling: Min guard Sidelying to sit: Min guard;HOB elevated       General bed mobility comments: Cues to log roll to maintain spinal precautions with success, min  guard for safety. Cued pt to bring legs off bed while trunk ascends, with extra time and min guard to complete.  Transfers Overall transfer level: Needs assistance Equipment used: Rolling walker (2 wheeled) Transfers: Sit to/from Stand Sit to Stand: Min assist         General transfer comment: Pt able to come to stand from bed pushing up from bed with minA for steadying and to power up to stand from lower surfaces.  Ambulation/Gait Ambulation/Gait assistance: Min assist Gait Distance (Feet): 30 Feet Assistive device: Rolling walker (2 wheeled) Gait Pattern/deviations: Step-through pattern;Decreased stride length;Narrow base of support;Decreased stance time - right;Decreased step length - left Gait velocity: decreased Gait velocity interpretation: <1.31 ft/sec, indicative of household ambulator General Gait Details: Ambulates with narrow BOS and knee buckling and arms shaking this date. Provided tactile cues at quads to encourage knee extension during stance bilat with success. Pt requiring cues to manage RW during turns and in tight spaces, with improved control this date. MinA for steadying. Cues to inc step length, with mod success.   Stairs             Wheelchair Mobility    Modified Rankin (Stroke Patients Only)       Balance Overall balance assessment: Needs assistance Sitting-balance support: No upper extremity supported;Feet supported Sitting balance-Leahy Scale: Good Sitting balance - Comments: Static sitting EOB no LOB, supervision for safety.   Standing balance support: Bilateral upper extremity supported;During functional activity Standing balance-Leahy Scale: Poor Standing balance comment: Reliant on UE support on RW and min guard - minA for balance.  Cognition Arousal/Alertness: Awake/alert Behavior During Therapy: WFL for tasks assessed/performed Overall Cognitive Status: Within Functional Limits for tasks  assessed                                        Exercises      General Comments        Pertinent Vitals/Pain Pain Assessment: 0-10 Pain Score: 3  Pain Location: operative site in back and shoulders; L chest with end of gait, RN notified Pain Descriptors / Indicators: Cramping;Burning Pain Intervention(s): Limited activity within patient's tolerance;Monitored during session;Repositioned;Other (comment) (notified RN of chest pain that seemed to reduce significantly within several minutes)    Home Living                      Prior Function            PT Goals (current goals can now be found in the care plan section) Acute Rehab PT Goals Patient Stated Goal: to improve and go to CIR PT Goal Formulation: With patient Time For Goal Achievement: 01/14/20 Potential to Achieve Goals: Good Progress towards PT goals: Progressing toward goals    Frequency    Min 5X/week      PT Plan Current plan remains appropriate    Co-evaluation              AM-PAC PT "6 Clicks" Mobility   Outcome Measure  Help needed turning from your back to your side while in a flat bed without using bedrails?: A Little Help needed moving from lying on your back to sitting on the side of a flat bed without using bedrails?: A Little Help needed moving to and from a bed to a chair (including a wheelchair)?: A Little Help needed standing up from a chair using your arms (e.g., wheelchair or bedside chair)?: A Little Help needed to walk in hospital room?: A Little Help needed climbing 3-5 steps with a railing? : A Lot 6 Click Score: 17    End of Session Equipment Utilized During Treatment: Gait belt;Oxygen (2L/min via [place]) Activity Tolerance: Patient tolerated treatment well Patient left: in chair;with call bell/phone within reach;with chair alarm set Nurse Communication: Mobility status;Other (comment) (chest pain at end of gait that reduced in several minutes) PT Visit  Diagnosis: Unsteadiness on feet (R26.81);Other abnormalities of gait and mobility (R26.89);Muscle weakness (generalized) (M62.81);Difficulty in walking, not elsewhere classified (R26.2);Pain Pain - Right/Left:  (back) Pain - part of body:  (back)     Time: 1531-1550 PT Time Calculation (min) (ACUTE ONLY): 19 min  Charges:  $Gait Training: 8-22 mins                     Moishe Spice, PT, DPT Acute Rehabilitation Services  Pager: [phone] Office: [place] 01/08/2020, 3:56 PM

## 2020-01-09 MED ORDER — FLEET ENEMA 7-19 GM/118ML RE ENEM
1.0000 | ENEMA | Freq: Once | RECTAL | Status: AC
  Administered 2020-01-09: 1 via RECTAL
  Filled 2020-01-09: qty 1

## 2020-01-09 NOTE — Progress Notes (Addendum)
   Providing Compassionate, Quality Care - Together   Subjective: Patient reports no issues overnight. He is awaiting appeal for CIR with his insurance.  Objective: Vital signs in last 24 hours: Temp:  [97.7 F (36.5 C)-99.5 F (37.5 C)] 97.7 F (36.5 C) (12/18 0716) Pulse Rate:  [101-109] 105 (12/18 0716) Resp:  [14-18] 14 (12/18 0716) BP: (110-132)/(66-84) 125/66 (12/18 0716) SpO2:  [91 %-100 %] 91 % (12/18 0716)  Intake/Output from previous day: 12/17 0701 - 12/18 0700 In: 400 [P.O.:400] Out: 2175 [Urine:2175] Intake/Output this shift: No intake/output data recorded.  Alert and oriented x 4 PERRLA CN II-XII grossly intact MAE, Strength and sensation intact    Lab Results: No results for input(s): WBC, HGB, HCT, PLT in the last 72 hours. BMET No results for input(s): NA, K, CL, CO2, GLUCOSE, BUN, CREATININE, CALCIUM in the last 72 hours.  Studies/Results: No results found.  Assessment/Plan: Patient is 10 days status post resection of epidural spinal tumor by Dr. Arnoldo Morale. He is awaiting appeal for CIR.   LOS: 10 days   -Continue to mobilize with therapies   Viona Gilmore, DNP, AGNP-C Nurse Practitioner  [facility] [place]. [address], [place], [place], [place] [address] P: [phone]    F: [phone]  01/09/2020, 10:12 AM

## 2020-01-09 NOTE — Progress Notes (Signed)
Physical Therapy Treatment Patient Details Name: Gregory Bautista MRN: [id number] DOB: 16-Jul-1940 Today's Date: 01/09/2020    History of Present Illness Pt is a 79 year old male who presented with L-sided chest pain. 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 The pt elected to have the following procedure on 12/30/19: T3 laminectomy with thoracic instrumentation and posterior fusion T1-5 with resection of epidural spinal tumor. PMH significant for lung cancer, tobacco use, and asthma.    PT Comments    Pt session limited secondary to left chest pain, coinciding with increase in HR to 122. Pt reported need to lay back down and chest pain resolved quickly with rest. Further mobility this session deferred. Prior to episode of chest pain pt was able to progress to EOB and sat for ~15 min. Pt was placed on RA while sitting EOB and SpO2 decreased to 86%, increasing to 94% once placed on 2L O2. Unable to obtain ambulating O2 sats this session. Rn notified of chest pain and resolution. Will continue to follow acutely for mobility progression.    Follow Up Recommendations  Supervision for mobility/OOB;CIR     Equipment Recommendations  Rolling walker with 5" wheels    Recommendations for Other Services Rehab consult     Precautions / Restrictions Precautions Precautions: Back;Fall Precaution Booklet Issued: Yes (comment) Precaution Comments: reviewed spinal precautions    Mobility  Bed Mobility Overal bed mobility: Needs Assistance Bed Mobility: Rolling;Sidelying to Sit Rolling: Min guard Sidelying to sit: Min guard;HOB elevated     Sit to sidelying: Min guard;HOB elevated General bed mobility comments: Pt defaulted to log rolling w/o cues. Min guard for safety and assist with line management only. Increased time and effort for all bed mobilities.  Transfers                 General transfer  comment: deferred due to chest pain  Ambulation/Gait                 Stairs             Wheelchair Mobility    Modified Rankin (Stroke Patients Only)       Balance Overall balance assessment: Needs assistance Sitting-balance support: No upper extremity supported;Feet supported Sitting balance-Leahy Scale: Good Sitting balance - Comments: able to sit EOB for ~15 min                                    Cognition Arousal/Alertness: Awake/alert Behavior During Therapy: WFL for tasks assessed/performed Overall Cognitive Status: Within Functional Limits for tasks assessed                                 General Comments: A&Ox4. Pt aware of deficits.      Exercises      General Comments General comments (skin integrity, edema, etc.): At rest on RA pt desat to 86%. Returned to 2L and SpO2 increased to 84%      Pertinent Vitals/Pain Pain Assessment: Faces Faces Pain Scale: Hurts little more Pain Location: operative site in back and shoulders; L chest when seated EOB, RN notified Pain Descriptors / Indicators: Cramping;Burning;Guarding;Grimacing Pain Intervention(s): Monitored during session;Limited activity within patient's tolerance    Home Living  Prior Function            PT Goals (current goals can now be found in the care plan section) Acute Rehab PT Goals Patient Stated Goal: to improve and go to CIR PT Goal Formulation: With patient Time For Goal Achievement: 01/14/20 Potential to Achieve Goals: Good Progress towards PT goals: Progressing toward goals    Frequency    Min 5X/week      PT Plan Current plan remains appropriate    Co-evaluation              AM-PAC PT "6 Clicks" Mobility   Outcome Measure  Help needed turning from your back to your side while in a flat bed without using bedrails?: A Little Help needed moving from lying on your back to sitting on the side of a  flat bed without using bedrails?: A Little Help needed moving to and from a bed to a chair (including a wheelchair)?: A Little Help needed standing up from a chair using your arms (e.g., wheelchair or bedside chair)?: A Little Help needed to walk in hospital room?: A Little Help needed climbing 3-5 steps with a railing? : A Lot 6 Click Score: 17    End of Session Equipment Utilized During Treatment: Oxygen (2L/min via [place]) Activity Tolerance: Treatment limited secondary to medical complications (Comment) (Chest pain with HR increase to 122 bpm) Patient left: with call bell/phone within reach;in bed;with bed alarm set (respiratory therapy present) Nurse Communication: Mobility status;Other (comment) (Chest pain with HR increase to 122) PT Visit Diagnosis: Unsteadiness on feet (R26.81);Other abnormalities of gait and mobility (R26.89);Muscle weakness (generalized) (M62.81);Difficulty in walking, not elsewhere classified (R26.2);Pain Pain - Right/Left:  (back) Pain - part of body:  (back)     Time: 0350-0938 PT Time Calculation (min) (ACUTE ONLY): 31 min  Charges:  $Therapeutic Activity: 23-37 mins                    Benjiman Core, [place] Pager [phone] Acute Rehab  Allena Katz 01/09/2020, 10:50 AM

## 2020-01-10 MED ORDER — SENNOSIDES-DOCUSATE SODIUM 8.6-50 MG PO TABS
1.0000 | ORAL_TABLET | Freq: Two times a day (BID) | ORAL | Status: DC
  Administered 2020-01-10 – 2020-01-12 (×4): 1 via ORAL
  Filled 2020-01-10 (×4): qty 1

## 2020-01-10 NOTE — Progress Notes (Signed)
Neurosurgery Service Progress Note  Subjective: No acute events overnight, no new complaints   Objective: Vitals:   01/10/20 0738 01/10/20 0753 01/10/20 1121 01/10/20 1500  BP: 109/70  118/73 92/64  Pulse: (!) 112 (!) 110 (!) 102 (!) 101  Resp: 18  17 16   Temp: 98.5 F (36.9 C)  98.2 F (36.8 C) 98 F (36.7 C)  TempSrc: Oral  Oral Oral  SpO2: 97%  97% 98%  Weight:      Height:        Physical Exam: Strength 5/5 x4, SILTx4  Assessment & Plan: 79 y.o. man s/p T3 lami, T1-5 PSIF.  -CIR pending -cont PT/OT while inpatient -SCDs/TEDs  Judith Part  01/10/20 3:33 PM

## 2020-01-11 ENCOUNTER — Encounter: Payer: Self-pay | Admitting: *Deleted

## 2020-01-11 ENCOUNTER — Other Ambulatory Visit: Payer: Medicare Other

## 2020-01-11 ENCOUNTER — Ambulatory Visit: Payer: Medicare Other | Admitting: Internal Medicine

## 2020-01-11 NOTE — Progress Notes (Signed)
Occupational Therapy Treatment Patient Details Name: Gregory Bautista MRN: [id number] DOB: [birth year] Today's Date: 01/11/2020    History of present illness Pt is a 79 year old male who presented with L-sided chest pain. 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 The pt elected to have the following procedure on 12/30/19: T3 laminectomy with thoracic instrumentation and posterior fusion T1-5 with resection of epidural spinal tumor. PMH significant for lung cancer, tobacco use, and asthma.   OT comments  Session limited by reports of L sided chest pain this session. Pt able to transition from supine>EOB with Min guard assist for line mgmt via log roll technique. Pt completed light grooming tasks seated EOB however pt with increase in HR to 123 bpm while sitting EOB and c/o L chest pain that pts reports starts in his back and radiates to pts chest describing pain as "burning." Deferred further activity and assited pt back to supine with min A. Continue to recommend CIR, pending progress, will continue to follow acutely per POC.   Follow Up Recommendations  CIR    Equipment Recommendations  3 in 1 bedside commode;Tub/shower bench    Recommendations for Other Services      Precautions / Restrictions Precautions Precautions: Back;Fall Precaution Booklet Issued: Yes (comment) Precaution Comments: pt able to state all precautions during session Restrictions Weight Bearing Restrictions: No       Mobility Bed Mobility Overal bed mobility: Needs Assistance Bed Mobility: Rolling;Sidelying to Sit;Sit to Sidelying Rolling: Min guard Sidelying to sit: Min guard     Sit to sidelying: Min assist General bed mobility comments: pt able to demo log roll technique from flat HOB with minguard for line mgmt, MIN A to return to supine needing assist to elevate legs  Transfers                 General transfer  comment: deferred due to chest pain    Balance Overall balance assessment: Needs assistance Sitting-balance support: No upper extremity supported;Feet supported Sitting balance-Leahy Scale: Good Sitting balance - Comments: able to sit EOB for grooming tasks ~ 6 mins                                   ADL either performed or assessed with clinical judgement   ADL Overall ADL's : Needs assistance/impaired     Grooming: Oral care;Sitting;Supervision/safety;Set up;Minimal assistance Grooming Details (indicate cue type and reason): MIN to complete task as pt reports pain in chest needing to lay down, washed off pts dentures d/t pain however pt able to complete oral care from EOB with set- up before increase pain         Upper Body Dressing : Minimal assistance;Sitting Upper Body Dressing Details (indicate cue type and reason): to don gown as back side cover                 Functional mobility during ADLs: Min guard (bed mobility only) General ADL Comments: pt limited by chest pain this session, able to transiton to EOB with min guard via log roll technique for seated oral care however returned pt to supine d/t increased HR and reports of "burning" chest pain     Vision       Perception     Praxis      Cognition Arousal/Alertness: Awake/alert Behavior During Therapy: Flat affect  Overall Cognitive Status: Within Functional Limits for tasks assessed                                 General Comments: distracted by pain        Exercises     Shoulder Instructions       General Comments pt on 2L during session with VSS; HR increase to 123 bpm with minimal activity    Pertinent Vitals/ Pain       Pain Assessment: Faces Faces Pain Scale: Hurts whole lot Pain Location: back pain that radiates to L chest Pain Descriptors / Indicators: Burning Pain Intervention(s): Limited activity within patient's tolerance;Monitored during  session;Repositioned;Other (comment) (alerted RN to reports of L chest pain and limited session)  Home Living                                          Prior Functioning/Environment              Frequency  Min 2X/week        Progress Toward Goals  OT Goals(current goals can now be found in the care plan section)  Progress towards OT goals: Progressing toward goals  Acute Rehab OT Goals Patient Stated Goal: to improve and go to CIR OT Goal Formulation: With patient Time For Goal Achievement: 01/14/20 Potential to Achieve Goals: Good  Plan Discharge plan remains appropriate;Frequency remains appropriate    Co-evaluation                 AM-PAC OT "6 Clicks" Daily Activity     Outcome Measure   Help from another person eating meals?: None Help from another person taking care of personal grooming?: A Little Help from another person toileting, which includes using toliet, bedpan, or urinal?: A Lot Help from another person bathing (including washing, rinsing, drying)?: A Lot Help from another person to put on and taking off regular upper body clothing?: A Little Help from another person to put on and taking off regular lower body clothing?: A Lot 6 Click Score: 16    End of Session    OT Visit Diagnosis: Unsteadiness on feet (R26.81);Muscle weakness (generalized) (M62.81);History of falling (Z91.81);Pain Pain - Right/Left: Left   Activity Tolerance Treatment limited secondary to medical complications (Comment);Other (comment) (limited by chest pain)   Patient Left in bed;with call bell/phone within reach;with bed alarm set;Other (comment) (respiratory therapist present assisting pts with inhaler)   Nurse Communication Mobility status;Other (comment) (limited session d/t chest pain)        Time: 6644-0347 OT Time Calculation (min): 21 min  Charges: OT General Charges $OT Visit: 1 Visit OT Treatments $Self Care/Home Management : 8-22  mins  Gregory Bautista., COTA/L Acute Rehabilitation Services [phone]   Gregory Bautista 01/11/2020, 9:46 AM

## 2020-01-11 NOTE — Progress Notes (Addendum)
Inpatient Rehab Admissions Coordinator:   Following for my colleague, Raechel Ache.  Pt's expedited appeal is complete, and denial was overturned.  Pt now approved for CIR.  I do not have a bed available for this patient to admit to CIR today.  Will let pt/family know, continue to follow for timing of potential admit pending bed availability.   Shann Medal, PT, DPT Admissions Coordinator [phone] 01/11/20  12:35 PM

## 2020-01-11 NOTE — Care Management Important Message (Signed)
Important Message  Patient Details  Name: Gregory Bautista MRN: [id number] Date of Birth: April 05, 1940   Medicare Important Message Given:  Yes     Orbie Pyo 01/11/2020, 4:06 PM

## 2020-01-11 NOTE — Progress Notes (Signed)
Per Dr. Julien Nordmann I notified pathology dept to send recent bx for molecular testing and PDL 1 to [facility].  I updated them.    Gregory Bautista was a no show to his new patient appt but he is in the hospital at this time. I updated Dr. Julien Nordmann.  I will contact new patient coordinator to re-schedule him.

## 2020-01-11 NOTE — Progress Notes (Signed)
   Providing Compassionate, Quality Care - Together   Subjective: Patient reports no issues overnight. Nurse reports patient with asymptomatic tachycardia. This has been ongoing since admission. He is awaiting appeal for CIR with his insurance.  Objective: Vital signs in last 24 hours: Temp:  [98 F (36.7 C)-99 F (37.2 C)] 98 F (36.7 C) (12/20 0800) Pulse Rate:  [100-110] 109 (12/20 0813) Resp:  [15-17] 15 (12/20 0414) BP: (92-122)/(62-73) 103/63 (12/20 0800) SpO2:  [92 %-98 %] 98 % (12/20 0813)  Intake/Output from previous day: 12/19 0701 - 12/20 0700 In: -  Out: 950 [Urine:950] Intake/Output this shift: No intake/output data recorded.  Alert and oriented x 4 PERRLA CN II-XII grossly intact MAE, Strength and sensation intact  Lab Results: No results for input(s): WBC, HGB, HCT, PLT in the last 72 hours. BMET No results for input(s): NA, K, CL, CO2, GLUCOSE, BUN, CREATININE, CALCIUM in the last 72 hours.  Studies/Results: No results found.  Assessment/Plan: Patient is 12 days status post resection of epidural spinal tumor by Dr. Arnoldo Morale. He is awaiting appeal for CIR.   LOS: 12 days    -Continue to mobilize with therapies -Encourage PO fluid intake. Tachycardia likely from fluid deficit.   Viona Gilmore, DNP, AGNP-C Nurse Practitioner  [facility] [place] [address], [address], [place], [place] [address] P: [phone]    F: [phone]  01/11/2020, 11:04 AM

## 2020-01-11 NOTE — Progress Notes (Signed)
Spoke with Dr. Adline Mango assistant about his heart rate sustaining in 100's to 117.  Pt verbalizes no complaints.

## 2020-01-11 NOTE — Progress Notes (Signed)
Physical Therapy Treatment Patient Details Name: Gregory Bautista MRN: [id number] DOB: Jan 06, 1941 Today's Date: 01/11/2020    History of Present Illness Pt is a 79 year old male who presented with L-sided chest pain. 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 The pt elected to have the following procedure on 12/30/19: T3 laminectomy with thoracic instrumentation and posterior fusion T1-5 with resection of epidural spinal tumor. PMH significant for lung cancer, tobacco use, and asthma.    PT Comments    Pt feeling improved since OT session this AM, no c/o chest pain and HR no higher than 115 bpm. Pt performed seated and standing exercises as well as practicing sit<>stand from bed, chair and BSC. Pt ambulated 10' fwd and bkwd but began to have BM so further ambulation was prevented. Pt noted fatigue after session. PT will continue to follow.   Follow Up Recommendations  Supervision for mobility/OOB;CIR     Equipment Recommendations  Rolling walker with 5" wheels    Recommendations for Other Services Rehab consult     Precautions / Restrictions Precautions Precautions: Back;Fall Restrictions Weight Bearing Restrictions: No    Mobility  Bed Mobility Overal bed mobility: Needs Assistance Bed Mobility: Supine to Sit     Supine to sit: Supervision     General bed mobility comments: pt able to come safely to EOB without assist  Transfers Overall transfer level: Needs assistance Equipment used: Rolling walker (2 wheeled) Transfers: Sit to/from Stand Sit to Stand: Min assist Stand pivot transfers: Min assist       General transfer comment: mi A for power up with first stand but to steady for subsequent stands. Performed SPT with and without RW from bed to chair and chair to and from [facility]  Ambulation/Gait Ambulation/Gait assistance: Min assist Gait Distance (Feet): 10 Feet Assistive device:  Rolling walker (2 wheeled) Gait Pattern/deviations: Step-through pattern;Decreased stride length;Narrow base of support;Decreased stance time - right;Decreased step length - left Gait velocity: decreased Gait velocity interpretation: <1.31 ft/sec, indicative of household ambulator General Gait Details: gait distance limited by pt beginning to have BM so went to [facility]. Pt with UE and LE shaking during ambulation. Worked on progressing fwd and bkwd stepping. HR 115 bpm   Stairs             Wheelchair Mobility    Modified Rankin (Stroke Patients Only)       Balance Overall balance assessment: Needs assistance Sitting-balance support: No upper extremity supported;Feet supported Sitting balance-Leahy Scale: Good     Standing balance support: Bilateral upper extremity supported;During functional activity Standing balance-Leahy Scale: Poor Standing balance comment: Reliant on UE support on RW and min guard - minA for balance.                            Cognition Arousal/Alertness: Awake/alert Behavior During Therapy: Flat affect Overall Cognitive Status: Within Functional Limits for tasks assessed                                        Exercises General Exercises - Lower Extremity Ankle Circles/Pumps: AROM;Both;Seated;10 reps (x2 sets) Gluteal Sets:  (x2 sets; bridging with min buttocks clearance to avoid weight through surgical site) Short Arc Quad:  (x2 sets) Long Arc Quad: Both;Seated;10 reps Hip ABduction/ADduction:  (isometrics  against therapist's ahnds; x2 sets) Straight Leg Raises:  (x2 sets) Hip Flexion/Marching: AROM;10 reps;Both;Seated;Standing    General Comments General comments (skin integrity, edema, etc.): HR 103-115 bpm throughout. Pt on 2 L O2, could not get reading on pulse ox for O2 sat      Pertinent Vitals/Pain Pain Assessment: Faces Faces Pain Scale: Hurts a little bit Pain Location: neck Pain Descriptors / Indicators:  Burning Pain Intervention(s): Monitored during session;Premedicated before session    Home Living                      Prior Function            PT Goals (current goals can now be found in the care plan section) Acute Rehab PT Goals Patient Stated Goal: to improve and go to CIR PT Goal Formulation: With patient Time For Goal Achievement: 01/14/20 Potential to Achieve Goals: Good Progress towards PT goals: Progressing toward goals    Frequency    Min 5X/week      PT Plan Current plan remains appropriate    Co-evaluation              AM-PAC PT "6 Clicks" Mobility   Outcome Measure  Help needed turning from your back to your side while in a flat bed without using bedrails?: A Little Help needed moving from lying on your back to sitting on the side of a flat bed without using bedrails?: A Little Help needed moving to and from a bed to a chair (including a wheelchair)?: A Little Help needed standing up from a chair using your arms (e.g., wheelchair or bedside chair)?: A Little Help needed to walk in hospital room?: A Little Help needed climbing 3-5 steps with a railing? : A Lot 6 Click Score: 17    End of Session Equipment Utilized During Treatment: Oxygen (2L/min via [place]) Activity Tolerance: Patient tolerated treatment well Patient left: with call bell/phone within reach;in chair;with chair alarm set (respiratory therapy present) Nurse Communication: Mobility status PT Visit Diagnosis: Unsteadiness on feet (R26.81);Other abnormalities of gait and mobility (R26.89);Muscle weakness (generalized) (M62.81);Difficulty in walking, not elsewhere classified (R26.2);Pain Pain - Right/Left:  (back) Pain - part of body:  (back)     Time: 1771-1657 PT Time Calculation (min) (ACUTE ONLY): 43 min  Charges:  $Gait Training: 8-22 mins $Therapeutic Exercise: 8-22 mins $Therapeutic Activity: 8-22 mins                     Leighton Roach, [place]   Pager [phone] [place] 01/11/2020, 2:06 PM

## 2020-01-12 ENCOUNTER — Inpatient Hospital Stay (HOSPITAL_COMMUNITY)
Admission: RE | Admit: 2020-01-12 | Discharge: 2020-02-15 | DRG: 559 | Disposition: A | Discharge status: Skilled Nursing Facility | Payer: Medicare Other | Source: Intra-hospital | Attending: Physical Medicine and Rehabilitation | Admitting: Physical Medicine and Rehabilitation

## 2020-01-12 ENCOUNTER — Encounter (HOSPITAL_COMMUNITY): Payer: Self-pay | Admitting: Physical Medicine and Rehabilitation

## 2020-01-12 ENCOUNTER — Inpatient Hospital Stay (HOSPITAL_COMMUNITY): Payer: Medicare Other

## 2020-01-12 ENCOUNTER — Other Ambulatory Visit: Payer: Self-pay

## 2020-01-12 DIAGNOSIS — I951 Orthostatic hypotension: Secondary | ICD-10-CM | POA: Diagnosis not present

## 2020-01-12 DIAGNOSIS — D170 Benign lipomatous neoplasm of skin and subcutaneous tissue of head, face and neck: Secondary | ICD-10-CM | POA: Diagnosis present

## 2020-01-12 DIAGNOSIS — N179 Acute kidney failure, unspecified: Secondary | ICD-10-CM | POA: Diagnosis present

## 2020-01-12 DIAGNOSIS — G4700 Insomnia, unspecified: Secondary | ICD-10-CM | POA: Diagnosis present

## 2020-01-12 DIAGNOSIS — I4510 Unspecified right bundle-branch block: Secondary | ICD-10-CM | POA: Diagnosis present

## 2020-01-12 DIAGNOSIS — Z9981 Dependence on supplemental oxygen: Secondary | ICD-10-CM

## 2020-01-12 DIAGNOSIS — Z23 Encounter for immunization: Secondary | ICD-10-CM

## 2020-01-12 DIAGNOSIS — C3412 Malignant neoplasm of upper lobe, left bronchus or lung: Secondary | ICD-10-CM | POA: Diagnosis present

## 2020-01-12 DIAGNOSIS — F32A Depression, unspecified: Secondary | ICD-10-CM | POA: Diagnosis present

## 2020-01-12 DIAGNOSIS — N39490 Overflow incontinence: Secondary | ICD-10-CM | POA: Diagnosis present

## 2020-01-12 DIAGNOSIS — H3411 Central retinal artery occlusion, right eye: Secondary | ICD-10-CM | POA: Diagnosis not present

## 2020-01-12 DIAGNOSIS — G8311 Monoplegia of lower limb affecting right dominant side: Secondary | ICD-10-CM | POA: Diagnosis present

## 2020-01-12 DIAGNOSIS — D638 Anemia in other chronic diseases classified elsewhere: Secondary | ICD-10-CM | POA: Diagnosis present

## 2020-01-12 DIAGNOSIS — N319 Neuromuscular dysfunction of bladder, unspecified: Secondary | ICD-10-CM

## 2020-01-12 DIAGNOSIS — Z09 Encounter for follow-up examination after completed treatment for conditions other than malignant neoplasm: Secondary | ICD-10-CM

## 2020-01-12 DIAGNOSIS — R630 Anorexia: Secondary | ICD-10-CM

## 2020-01-12 DIAGNOSIS — Z79899 Other long term (current) drug therapy: Secondary | ICD-10-CM

## 2020-01-12 DIAGNOSIS — Z87891 Personal history of nicotine dependence: Secondary | ICD-10-CM

## 2020-01-12 DIAGNOSIS — Z6820 Body mass index (BMI) 20.0-20.9, adult: Secondary | ICD-10-CM

## 2020-01-12 DIAGNOSIS — Z20822 Contact with and (suspected) exposure to covid-19: Secondary | ICD-10-CM | POA: Diagnosis present

## 2020-01-12 DIAGNOSIS — G992 Myelopathy in diseases classified elsewhere: Secondary | ICD-10-CM | POA: Diagnosis present

## 2020-01-12 DIAGNOSIS — Z7189 Other specified counseling: Secondary | ICD-10-CM | POA: Diagnosis not present

## 2020-01-12 DIAGNOSIS — R29704 NIHSS score 4: Secondary | ICD-10-CM | POA: Diagnosis not present

## 2020-01-12 DIAGNOSIS — R531 Weakness: Secondary | ICD-10-CM | POA: Diagnosis not present

## 2020-01-12 DIAGNOSIS — N390 Urinary tract infection, site not specified: Secondary | ICD-10-CM | POA: Diagnosis present

## 2020-01-12 DIAGNOSIS — I959 Hypotension, unspecified: Secondary | ICD-10-CM | POA: Diagnosis present

## 2020-01-12 DIAGNOSIS — K5903 Drug induced constipation: Secondary | ICD-10-CM

## 2020-01-12 DIAGNOSIS — H409 Unspecified glaucoma: Secondary | ICD-10-CM | POA: Diagnosis present

## 2020-01-12 DIAGNOSIS — R918 Other nonspecific abnormal finding of lung field: Secondary | ICD-10-CM | POA: Diagnosis present

## 2020-01-12 DIAGNOSIS — G479 Sleep disorder, unspecified: Secondary | ICD-10-CM | POA: Diagnosis not present

## 2020-01-12 DIAGNOSIS — M4714 Other spondylosis with myelopathy, thoracic region: Secondary | ICD-10-CM | POA: Diagnosis not present

## 2020-01-12 DIAGNOSIS — C3410 Malignant neoplasm of upper lobe, unspecified bronchus or lung: Secondary | ICD-10-CM | POA: Diagnosis not present

## 2020-01-12 DIAGNOSIS — B961 Klebsiella pneumoniae [K. pneumoniae] as the cause of diseases classified elsewhere: Secondary | ICD-10-CM | POA: Diagnosis present

## 2020-01-12 DIAGNOSIS — Z7982 Long term (current) use of aspirin: Secondary | ICD-10-CM

## 2020-01-12 DIAGNOSIS — Z515 Encounter for palliative care: Secondary | ICD-10-CM | POA: Diagnosis not present

## 2020-01-12 DIAGNOSIS — Z66 Do not resuscitate: Secondary | ICD-10-CM | POA: Diagnosis present

## 2020-01-12 DIAGNOSIS — G8220 Paraplegia, unspecified: Secondary | ICD-10-CM | POA: Diagnosis present

## 2020-01-12 DIAGNOSIS — E860 Dehydration: Secondary | ICD-10-CM | POA: Diagnosis present

## 2020-01-12 DIAGNOSIS — C3400 Malignant neoplasm of unspecified main bronchus: Secondary | ICD-10-CM | POA: Diagnosis present

## 2020-01-12 DIAGNOSIS — R627 Adult failure to thrive: Secondary | ICD-10-CM | POA: Diagnosis present

## 2020-01-12 DIAGNOSIS — K592 Neurogenic bowel, not elsewhere classified: Secondary | ICD-10-CM | POA: Diagnosis present

## 2020-01-12 DIAGNOSIS — R1319 Other dysphagia: Secondary | ICD-10-CM

## 2020-01-12 DIAGNOSIS — I6389 Other cerebral infarction: Secondary | ICD-10-CM | POA: Diagnosis not present

## 2020-01-12 DIAGNOSIS — Z4789 Encounter for other orthopedic aftercare: Principal | ICD-10-CM

## 2020-01-12 DIAGNOSIS — C7951 Secondary malignant neoplasm of bone: Secondary | ICD-10-CM | POA: Diagnosis present

## 2020-01-12 DIAGNOSIS — D492 Neoplasm of unspecified behavior of bone, soft tissue, and skin: Secondary | ICD-10-CM

## 2020-01-12 DIAGNOSIS — R159 Full incontinence of feces: Secondary | ICD-10-CM | POA: Diagnosis present

## 2020-01-12 DIAGNOSIS — B964 Proteus (mirabilis) (morganii) as the cause of diseases classified elsewhere: Secondary | ICD-10-CM | POA: Diagnosis present

## 2020-01-12 DIAGNOSIS — E43 Unspecified severe protein-calorie malnutrition: Secondary | ICD-10-CM | POA: Diagnosis present

## 2020-01-12 DIAGNOSIS — R1313 Dysphagia, pharyngeal phase: Secondary | ICD-10-CM | POA: Diagnosis not present

## 2020-01-12 DIAGNOSIS — R000 Tachycardia, unspecified: Secondary | ICD-10-CM

## 2020-01-12 DIAGNOSIS — R410 Disorientation, unspecified: Secondary | ICD-10-CM | POA: Diagnosis not present

## 2020-01-12 DIAGNOSIS — K5900 Constipation, unspecified: Secondary | ICD-10-CM | POA: Diagnosis present

## 2020-01-12 DIAGNOSIS — J45909 Unspecified asthma, uncomplicated: Secondary | ICD-10-CM | POA: Diagnosis present

## 2020-01-12 DIAGNOSIS — I10 Essential (primary) hypertension: Secondary | ICD-10-CM | POA: Diagnosis present

## 2020-01-12 DIAGNOSIS — D62 Acute posthemorrhagic anemia: Secondary | ICD-10-CM | POA: Diagnosis present

## 2020-01-12 DIAGNOSIS — G8918 Other acute postprocedural pain: Secondary | ICD-10-CM | POA: Diagnosis not present

## 2020-01-12 DIAGNOSIS — R1312 Dysphagia, oropharyngeal phase: Secondary | ICD-10-CM | POA: Diagnosis not present

## 2020-01-12 DIAGNOSIS — I700 Atherosclerosis of aorta: Secondary | ICD-10-CM | POA: Diagnosis present

## 2020-01-12 DIAGNOSIS — K219 Gastro-esophageal reflux disease without esophagitis: Secondary | ICD-10-CM | POA: Diagnosis present

## 2020-01-12 DIAGNOSIS — E875 Hyperkalemia: Secondary | ICD-10-CM

## 2020-01-12 DIAGNOSIS — H5461 Unqualified visual loss, right eye, normal vision left eye: Secondary | ICD-10-CM | POA: Diagnosis not present

## 2020-01-12 DIAGNOSIS — R1310 Dysphagia, unspecified: Secondary | ICD-10-CM

## 2020-01-12 LAB — BASIC METABOLIC PANEL
Anion gap: 8 (ref 5–15)
BUN: 14 mg/dL (ref 8–23)
CO2: 31 mmol/L (ref 22–32)
Calcium: 10.4 mg/dL — ABNORMAL HIGH (ref 8.9–10.3)
Chloride: 98 mmol/L (ref 98–111)
Creatinine, Ser: 1.01 mg/dL (ref 0.61–1.24)
GFR, Estimated: >60 mL/min (ref >60)
Glucose, Bld: 143 mg/dL — ABNORMAL HIGH (ref 70–99)
Potassium: 4.8 mmol/L (ref 3.5–5.1)
Sodium: 137 mmol/L (ref 135–145)

## 2020-01-12 LAB — CBC WITH DIFFERENTIAL/PLATELET
Abs Immature Granulocytes: 0.04 10*3/uL (ref 0.00–0.07)
Basophils Absolute: 0 10*3/uL (ref 0.0–0.1)
Basophils Relative: 0 %
Eosinophils Absolute: 0.1 10*3/uL (ref 0.0–0.5)
Eosinophils Relative: 1 %
HCT: 37 % — ABNORMAL LOW (ref 39.0–52.0)
Hemoglobin: 11.6 g/dL — ABNORMAL LOW (ref 13.0–17.0)
Immature Granulocytes: 1 %
Lymphocytes Relative: 21 %
Lymphs Abs: 1.4 10*3/uL (ref 0.7–4.0)
MCH: 28.6 pg (ref 26.0–34.0)
MCHC: 31.4 g/dL (ref 30.0–36.0)
MCV: 91.1 fL (ref 80.0–100.0)
Monocytes Absolute: 0.5 10*3/uL (ref 0.1–1.0)
Monocytes Relative: 7 %
Neutro Abs: 4.8 10*3/uL (ref 1.7–7.7)
Neutrophils Relative %: 70 %
Platelets: 266 10*3/uL (ref 150–400)
RBC: 4.06 MIL/uL — ABNORMAL LOW (ref 4.22–5.81)
RDW: 12.3 % (ref 11.5–15.5)
WBC: 6.9 10*3/uL (ref 4.0–10.5)
nRBC: 0 % (ref 0.0–0.2)

## 2020-01-12 LAB — URINE CULTURE: Culture: >=100000 — AB

## 2020-01-12 IMAGING — DX DG ABDOMEN 1V
1 series · 1 of 1 positions shown · non-contrast
Comparison: None.

CLINICAL DATA: Abdominal pain, constipation

EXAM:
ABDOMEN - 1 VIEW

[t abdomen supine]
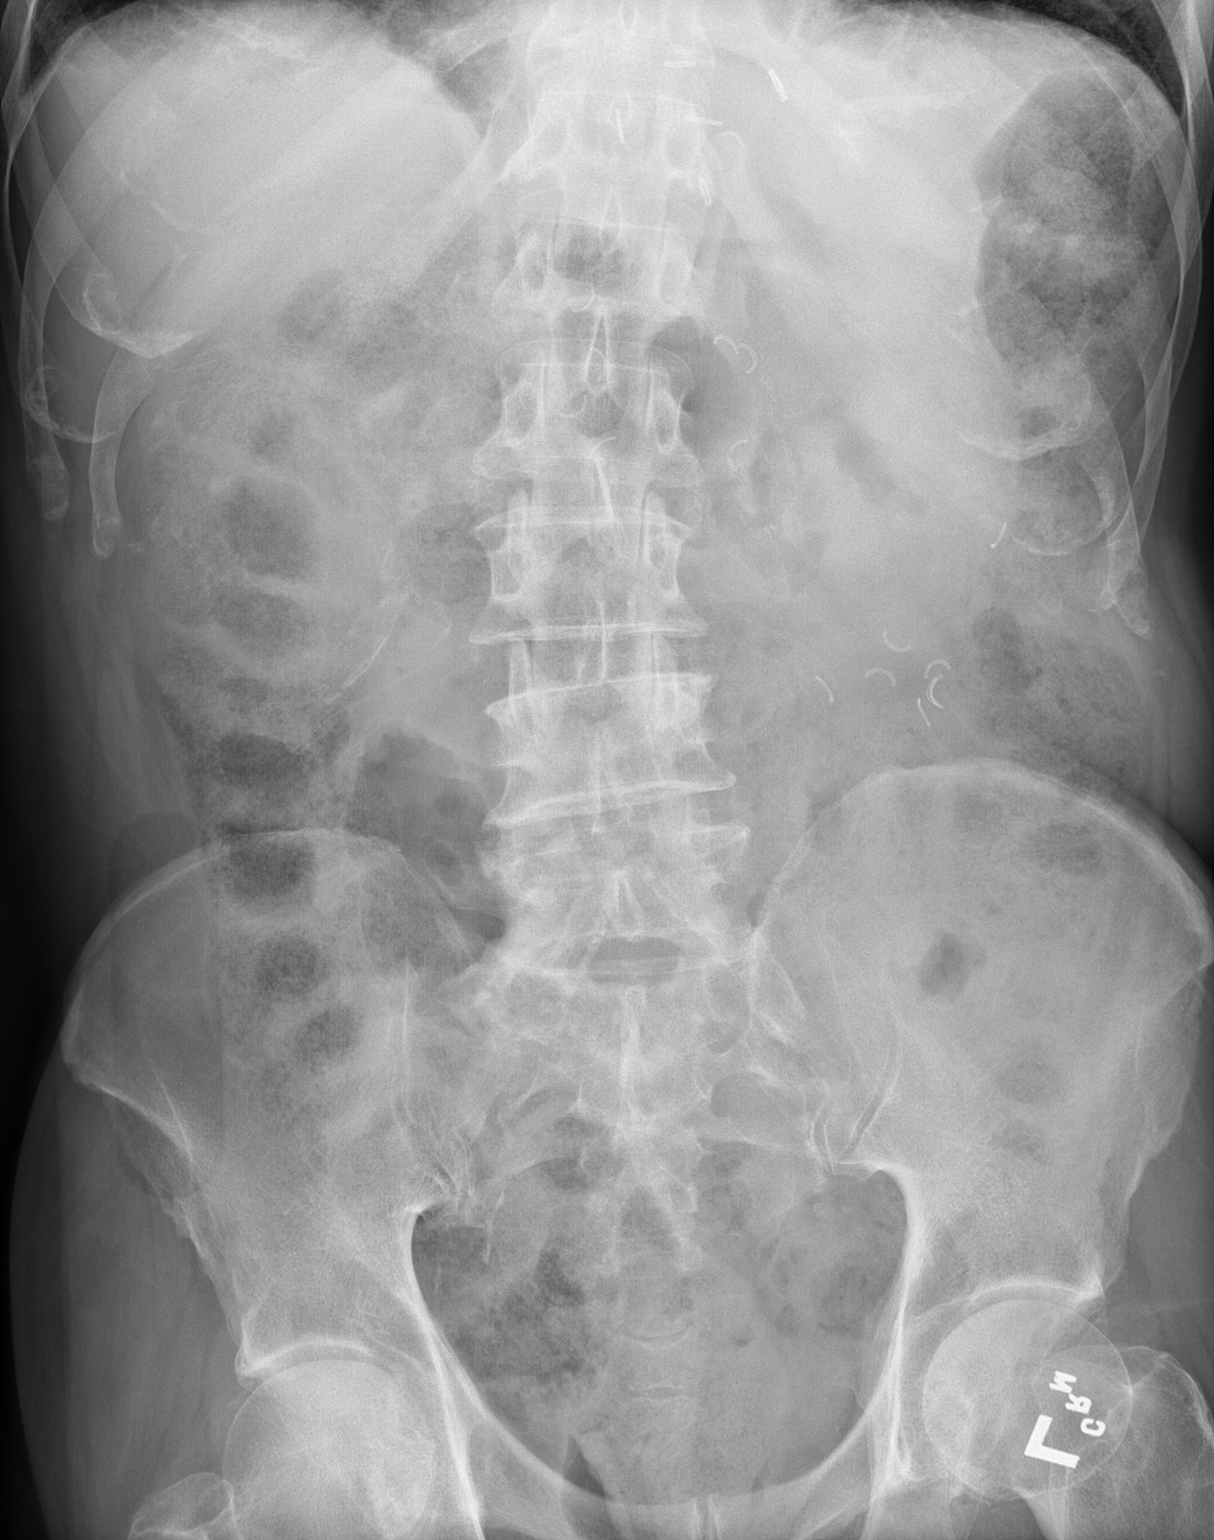

[1 of 1 positions shown; findings below may reference images not displayed]

FINDINGS: Supine frontal view of the abdomen and pelvis excludes the pubic
symphysis by collimation. No bowel obstruction or ileus. Significant
retained stool throughout the colon consistent with constipation.
Multiple surgical clips throughout the upper abdomen. Curvilinear
calcification right mid abdomen of uncertain etiology, but could be
associated with the right kidney. No acute bony abnormalities.
IMPRESSION: 1. Significant fecal retention compatible with constipation.
2. Curvilinear calcification right mid abdomen, of uncertain
etiology. This could be related to the right kidney. If further
evaluation is desired, ultrasound or CT could be performed.

## 2020-01-12 MED ORDER — OXYCODONE HCL 5 MG PO TABS: 5.0000 mg | ORAL_TABLET | ORAL | Status: DC | PRN

## 2020-01-12 MED ORDER — TRAMADOL HCL 50 MG PO TABS
50.0000 mg | ORAL_TABLET | Freq: Four times a day (QID) | ORAL | Status: DC | PRN
  Administered 2020-01-25 – 2020-02-10 (×5): 50 mg via ORAL
  Filled 2020-01-12 (×6): qty 1

## 2020-01-12 MED ORDER — BISACODYL 10 MG RE SUPP: 10.0000 mg | Freq: Every day | RECTAL | Status: DC | PRN

## 2020-01-12 MED ORDER — PROCHLORPERAZINE 25 MG RE SUPP: 12.5000 mg | Freq: Four times a day (QID) | RECTAL | Status: DC | PRN

## 2020-01-12 MED ORDER — DOCUSATE SODIUM 100 MG PO CAPS
200.0000 mg | ORAL_CAPSULE | Freq: Every day | ORAL | Status: DC
  Administered 2020-01-13 – 2020-01-15 (×3): 200 mg via ORAL
  Filled 2020-01-12 (×3): qty 2

## 2020-01-12 MED ORDER — POLYETHYLENE GLYCOL 3350 17 G PO PACK: 17.0000 g | PACK | Freq: Every day | ORAL | 0 refills | Status: DC

## 2020-01-12 MED ORDER — GUAIFENESIN-DM 100-10 MG/5ML PO SYRP: 5.0000 mL | ORAL_SOLUTION | Freq: Four times a day (QID) | ORAL | Status: DC | PRN

## 2020-01-12 MED ORDER — PROCHLORPERAZINE EDISYLATE 10 MG/2ML IJ SOLN: 5.0000 mg | Freq: Four times a day (QID) | INTRAMUSCULAR | Status: DC | PRN

## 2020-01-12 MED ORDER — FLEET ENEMA 7-19 GM/118ML RE ENEM: 1.0000 | ENEMA | Freq: Once | RECTAL | Status: DC | PRN

## 2020-01-12 MED ORDER — CIPROFLOXACIN HCL 500 MG PO TABS
500.0000 mg | ORAL_TABLET | Freq: Two times a day (BID) | ORAL | Status: DC
  Administered 2020-01-12 – 2020-01-13 (×2): 500 mg via ORAL
  Filled 2020-01-12 (×2): qty 1

## 2020-01-12 MED ORDER — LATANOPROST 0.005 % OP SOLN
1.0000 [drp] | Freq: Every day | OPHTHALMIC | Status: DC
  Administered 2020-01-12 – 2020-02-14 (×29): 1 [drp] via OPHTHALMIC
  Filled 2020-01-12: qty 2.5

## 2020-01-12 MED ORDER — CYCLOBENZAPRINE HCL 10 MG PO TABS
10.0000 mg | ORAL_TABLET | Freq: Three times a day (TID) | ORAL | Status: DC | PRN
  Administered 2020-01-25 – 2020-02-11 (×3): 10 mg via ORAL
  Filled 2020-01-12 (×4): qty 1

## 2020-01-12 MED ORDER — PHENOL 1.4 % MT LIQD: 1.0000 | OROMUCOSAL | Status: DC | PRN

## 2020-01-12 MED ORDER — CYCLOBENZAPRINE HCL 10 MG PO TABS: 10.0000 mg | ORAL_TABLET | Freq: Three times a day (TID) | ORAL | 0 refills | Status: DC | PRN

## 2020-01-12 MED ORDER — TAMSULOSIN HCL 0.4 MG PO CAPS: 0.4000 mg | ORAL_CAPSULE | Freq: Every day | ORAL | 0 refills | Status: DC

## 2020-01-12 MED ORDER — DOCUSATE SODIUM 100 MG PO CAPS: 100.0000 mg | ORAL_CAPSULE | Freq: Two times a day (BID) | ORAL | 0 refills | Status: DC

## 2020-01-12 MED ORDER — PROCHLORPERAZINE MALEATE 5 MG PO TABS: 5.0000 mg | ORAL_TABLET | Freq: Four times a day (QID) | ORAL | Status: DC | PRN

## 2020-01-12 MED ORDER — IPRATROPIUM BROMIDE 0.06 % NA SOLN
2.0000 | Freq: Three times a day (TID) | NASAL | Status: DC
  Administered 2020-01-13 – 2020-02-14 (×77): 2 via NASAL
  Filled 2020-01-12 (×2): qty 15

## 2020-01-12 MED ORDER — MENTHOL 3 MG MT LOZG: 1.0000 | LOZENGE | OROMUCOSAL | Status: DC | PRN

## 2020-01-12 MED ORDER — TAMSULOSIN HCL 0.4 MG PO CAPS
0.4000 mg | ORAL_CAPSULE | Freq: Every day | ORAL | Status: DC
Start: 2020-01-13 — End: 2020-01-16
  Administered 2020-01-13 – 2020-01-15 (×3): 0.4 mg via ORAL
  Filled 2020-01-12 (×3): qty 1

## 2020-01-12 MED ORDER — BISACODYL 10 MG RE SUPP
10.0000 mg | Freq: Every day | RECTAL | Status: DC
  Administered 2020-01-12 – 2020-02-14 (×24): 10 mg via RECTAL
  Filled 2020-01-12 (×30): qty 1

## 2020-01-12 MED ORDER — TAMSULOSIN HCL 0.4 MG PO CAPS: 0.4000 mg | ORAL_CAPSULE | Freq: Every day | ORAL | Status: DC

## 2020-01-12 MED ORDER — COVID-19 MRNA VACCINE (PFIZER) 30 MCG/0.3ML IM SUSP
0.3000 mL | Freq: Once | INTRAMUSCULAR | Status: AC
  Administered 2020-01-12: 0.3 mL via INTRAMUSCULAR
  Filled 2020-01-12: qty 0.3

## 2020-01-12 MED ORDER — OXYCODONE HCL 5 MG PO TABS
5.0000 mg | ORAL_TABLET | ORAL | Status: DC | PRN
  Administered 2020-01-12 – 2020-01-13 (×2): 10 mg via ORAL
  Administered 2020-01-15: 5 mg via ORAL
  Administered 2020-01-18: 10 mg via ORAL
  Filled 2020-01-12 (×2): qty 2
  Filled 2020-01-12: qty 1
  Filled 2020-01-12: qty 2

## 2020-01-12 MED ORDER — OXYCODONE HCL 5 MG PO TABS
5.0000 mg | ORAL_TABLET | ORAL | 0 refills | Status: DC | PRN
Start: 2020-01-12 — End: 2020-02-15

## 2020-01-12 MED ORDER — SENNOSIDES-DOCUSATE SODIUM 8.6-50 MG PO TABS
2.0000 | ORAL_TABLET | Freq: Every day | ORAL | Status: DC
  Administered 2020-01-13 – 2020-02-11 (×22): 2 via ORAL
  Filled 2020-01-12 (×27): qty 2

## 2020-01-12 MED ORDER — ACETAMINOPHEN 325 MG PO TABS
325.0000 mg | ORAL_TABLET | ORAL | Status: DC | PRN
  Administered 2020-01-13 – 2020-01-29 (×15): 650 mg via ORAL
  Filled 2020-01-12 (×16): qty 2

## 2020-01-12 MED ORDER — TIMOLOL MALEATE 0.5 % OP SOLN
1.0000 [drp] | Freq: Two times a day (BID) | OPHTHALMIC | Status: DC
  Administered 2020-01-12 – 2020-02-15 (×60): 1 [drp] via OPHTHALMIC
  Filled 2020-01-12 (×3): qty 5

## 2020-01-12 MED ORDER — CIPROFLOXACIN HCL 500 MG PO TABS
500.0000 mg | ORAL_TABLET | Freq: Two times a day (BID) | ORAL | Status: DC
  Administered 2020-01-12: 500 mg via ORAL
  Filled 2020-01-12: qty 1

## 2020-01-12 MED ORDER — GABAPENTIN 100 MG PO CAPS
100.0000 mg | ORAL_CAPSULE | Freq: Three times a day (TID) | ORAL | Status: DC
  Administered 2020-01-12 – 2020-01-21 (×26): 100 mg via ORAL
  Filled 2020-01-12 (×26): qty 1

## 2020-01-12 MED ORDER — POLYETHYLENE GLYCOL 3350 17 G PO PACK
17.0000 g | PACK | Freq: Every day | ORAL | Status: DC | PRN
  Administered 2020-01-25: 17 g via ORAL
  Filled 2020-01-12: qty 1

## 2020-01-12 MED ORDER — ALUM & MAG HYDROXIDE-SIMETH 200-200-20 MG/5ML PO SUSP: 30.0000 mL | ORAL | Status: DC | PRN

## 2020-01-12 MED ORDER — ADULT MULTIVITAMIN W/MINERALS CH
1.0000 | ORAL_TABLET | Freq: Every day | ORAL | Status: DC
  Administered 2020-01-13 – 2020-02-05 (×22): 1 via ORAL
  Filled 2020-01-12 (×24): qty 1

## 2020-01-12 MED ORDER — SENNOSIDES-DOCUSATE SODIUM 8.6-50 MG PO TABS: 1.0000 | ORAL_TABLET | Freq: Two times a day (BID) | ORAL | Status: DC

## 2020-01-12 MED ORDER — CIPROFLOXACIN HCL 500 MG PO TABS: 500.0000 mg | ORAL_TABLET | Freq: Two times a day (BID) | ORAL | Status: DC

## 2020-01-12 MED ORDER — DIPHENHYDRAMINE HCL 12.5 MG/5ML PO ELIX: 12.5000 mg | ORAL_SOLUTION | Freq: Four times a day (QID) | ORAL | Status: DC | PRN

## 2020-01-12 MED ORDER — ACETAMINOPHEN 325 MG PO TABS: 325.0000 mg | ORAL_TABLET | ORAL | Status: DC | PRN

## 2020-01-12 MED ORDER — HEPARIN SODIUM (PORCINE) 5000 UNIT/ML IJ SOLN
5000.0000 [IU] | Freq: Three times a day (TID) | INTRAMUSCULAR | Status: DC
  Administered 2020-01-12 – 2020-02-15 (×86): 5000 [IU] via SUBCUTANEOUS
  Filled 2020-01-12 (×88): qty 1

## 2020-01-12 MED ORDER — ENSURE ENLIVE PO LIQD
237.0000 mL | Freq: Three times a day (TID) | ORAL | Status: DC
  Administered 2020-01-12 – 2020-02-06 (×63): 237 mL via ORAL
  Filled 2020-01-12 (×3): qty 237

## 2020-01-12 MED ORDER — MELATONIN 3 MG PO TABS
3.0000 mg | ORAL_TABLET | Freq: Every day | ORAL | Status: DC
  Administered 2020-01-12 – 2020-02-11 (×27): 3 mg via ORAL
  Filled 2020-01-12 (×28): qty 1

## 2020-01-12 MED ORDER — ALBUTEROL SULFATE HFA 108 (90 BASE) MCG/ACT IN AERS: 2.0000 | INHALATION_SPRAY | Freq: Four times a day (QID) | RESPIRATORY_TRACT | Status: DC | PRN

## 2020-01-12 MED ORDER — MOMETASONE FURO-FORMOTEROL FUM 200-5 MCG/ACT IN AERO
2.0000 | INHALATION_SPRAY | Freq: Two times a day (BID) | RESPIRATORY_TRACT | Status: DC
  Administered 2020-01-13 – 2020-02-15 (×59): 2 via RESPIRATORY_TRACT
  Filled 2020-01-12 (×3): qty 8.8

## 2020-01-12 MED ORDER — JUVEN PO PACK
1.0000 | PACK | Freq: Two times a day (BID) | ORAL | Status: DC
  Administered 2020-01-13 – 2020-02-02 (×26): 1 via ORAL
  Filled 2020-01-12 (×56): qty 1

## 2020-01-12 NOTE — Progress Notes (Signed)
Subjective:  The patient is alert and pleasant.  He has been slow to mobilize.  He has not been eating much.  Objective: Vital signs in last 24 hours: Temp:  [97.8 F (36.6 C)-98.9 F (37.2 C)] 98.6 F (37 C) (12/21 0753) Pulse Rate:  [99-113] 108 (12/21 0753) Resp:  [16-20] 17 (12/21 0753) BP: (90-119)/(56-82) 101/62 (12/21 0753) SpO2:  [95 %-99 %] 95 % (12/21 0753) Estimated body mass index is 23.48 kg/m as calculated from the following:   Height as of this encounter: 5\' 9"  (1.753 m).   Weight as of this encounter: 72.1 kg.   Intake/Output from previous day: 12/20 0701 - 12/21 0700 In: -  Out: 800 [Urine:800] Intake/Output this shift: No intake/output data recorded.  Physical exam  The patient is alert and oriented.  His lower extremity strength is grossly normal .  His thoracic incision is healing well.  Lab Results: No results for input(s): WBC, HGB, HCT, PLT in the last 72 hours. BMET No results for input(s): NA, K, CL, CO2, GLUCOSE, BUN, CREATININE, CALCIUM in the last 72 hours.  Studies/Results: No results found.  Assessment/Plan:   Postop day 13.:  I have encouraged the patient to mobilize and to increase his p.o. intake.  Urine tract infection: His urine culture has grown Proteus and Klebsiella sensitive to Cipro.  I will start Cipro.  We will continue his catheter another day or 2 and then again try to discontinue it.    Tachycardia: I have encouraged the patient to increase his p.o. intake.  I suspect he is a bit dehydrated.  I will check labs.  We are awaiting rehab placement.  LOS: 13 days     Ophelia Charter 01/12/2020, 9:12 AM

## 2020-01-12 NOTE — H&P (Signed)
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  Tumor was not felt to be resectable due to advanced disease and spine stabilization recommended with plans for chemo and XRT in the future.  He was evaluated by Dr. Arnoldo Morale and was admitted on 12/30/2019 for T2-T4 laminectomy for tumor resection with T1-T5 posterior arthrodesis with pedicle screws and rods.  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  Plans are for XRT by Dr. Randa Ngo with reports to of simulation set for 12/27. Had been set for follow up with Dr. Earlie Server on outpatient basis.   Hospital course has been significant for acute renal failure, overflow incontinence with bladder volumes up to 1250 ml and failed voiding trial therefore foley replaced as well as hypoxia with activity-->remains on supplemental oxygen.  Urine culture was done showing evidence of Proteus and Klebsiella UTI and he was started on Cipro today. He has had ongoing issues with resting tachycardia felt to be due to dehydration. Patient continues to be limited by BLE weakness with instability, left chest pain with burning and has been weaned off oxygen. He continues to have limitations in mobility and ADLS. CIR recommended due to functional decline.    Pt reports hasn't been eating much for the past 1-2 months.  Admits has only been drinking 2 cups of water/day at most. Sounds very dehydrated.  Hasn't been able to control having a BM at all.  Has foley  due to urinary retention- being treated for UTI.   Review of Systems  Constitutional: Negative for chills and fever.       Lost appetite 2 months ago--can't eat due to lack of appetite.   HENT: Negative for hearing loss.   Respiratory: Positive for cough. Negative for sputum production and shortness of breath.   Cardiovascular: Positive for chest pain (left chest wall pain ongoing --comes and goes). Negative for palpitations.  Gastrointestinal: Positive for constipation. Negative for heartburn and nausea.  Musculoskeletal: Positive for back pain (more left shoulder pain). Negative for joint pain, myalgias and neck pain.  Skin: Negative for itching and rash.  Neurological: Positive for sensory change and weakness. Negative for dizziness and headaches.  Psychiatric/Behavioral: The patient has insomnia (has been jumpy).   All other systems reviewed and are negative.    Past Medical History:  Diagnosis Date  . Allergic rhinitis    NEC  . Asthma   . Glaucoma   . Lung cancer, main bronchus ([facility]) 12/2019  . Tobacco use disorder 09/24/2019    Past Surgical History:  Procedure Laterality Date  . COLON SURGERY    . EYE SURGERY Right   . LAMINECTOMY WITH POSTERIOR LATERAL ARTHRODESIS LEVEL 4 N/A 12/30/2019   Procedure: THORACIC THREE LAMINECTOMY, THORACIC INSTRUMENTATION AND FUSION THORACIC ONE-FIVE;  Surgeon: Newman Pies, MD;  Location: [facility];  Service: Neurosurgery;  Laterality: N/A;    Family History  Problem Relation Age of Onset  . Cancer Mother   . Heart attack Father   . Cancer  Brother     Social History:  Lives alone--family lives out of state. Brother from [place] here to help for a short time. 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 He reports that he quit smoking about 3 months ago. His smoking use included cigarettes--about on pack per day.  He started smoking about 64 years ago. He has a 30.00 pack-year smoking history. He has never used smokeless  tobacco. He reports that he does not drink alcohol and does not use drugs.    Allergies  Allergen Reactions  . Penicillins Anaphylaxis and Other (See Comments)    Patient disputes this in 2021  Has patient had a PCN reaction causing immediate rash, facial/tongue/throat swelling, SOB or lightheadedness with hypotension: Yes Has patient had a PCN reaction causing severe rash involving mucus membranes or skin necrosis: No Has patient had a PCN reaction that required hospitalization No Has patient had a PCN reaction occurring within the last 10 years: Yes If all of the above answers are "NO", then may proceed with Cephalosporin use.   . Wellbutrin [Bupropion] Other (See Comments)    Patient disputes this in 2021 Seizures  . Morphine And Related Itching and Other (See Comments)    Patient disputes this in 2021    Medications Prior to Admission  Medication Sig Dispense Refill  . albuterol (VENTOLIN HFA) 108 (90 Base) MCG/ACT inhaler Inhale 2 puffs into the lungs every 6 (six) hours as needed for wheezing or shortness of breath.    [name] aspirin EC 81 MG tablet Take 81 mg by mouth daily. Swallow whole.    . cholecalciferol (VITAMIN D3) 25 MCG (1000 UNIT) tablet Take 1,000 Units by mouth daily.    . ciprofloxacin (CIPRO) 500 MG tablet Take 1 tablet (500 mg total) by mouth 2 (two) times daily. 10 tablet   . cyclobenzaprine (FLEXERIL) 10 MG tablet Take 1 tablet (10 mg total) by mouth 3 (three) times daily as needed for muscle spasms. 30 tablet 0  . diclofenac Sodium (VOLTAREN) 1 % GEL Apply 1 application topically 4 (four) times daily as needed (pain).     [name] docusate sodium (COLACE) 100 MG capsule Take 1 capsule (100 mg total) by mouth 2 (two) times daily. 10 capsule 0  . gabapentin (NEURONTIN) 100 MG capsule Take 100 mg by mouth 3 (three) times daily.    [name] ipratropium (ATROVENT) 0.06 % nasal spray Place 2 sprays into both nostrils daily as needed for rhinitis.    . Multiple Vitamin (MULTIVITAMIN  WITH MINERALS) TABS tablet Take 1 tablet by mouth daily.    [name] oxyCODONE (OXY IR/ROXICODONE) 5 MG immediate release tablet Take 1 tablet (5 mg total) by mouth every 4 (four) hours as needed for moderate pain ((score 4 to 6)). 30 tablet 0  . polyethylene glycol (MIRALAX / GLYCOLAX) 17 g packet Take 17 g by mouth daily. 14 each 0  . senna-docusate (SENOKOT-S) 8.6-50 MG tablet Take 1 tablet by mouth 2 (two) times daily.    . SYMBICORT 160-4.5 MCG/ACT inhaler Inhale 1 puff into the lungs 2 (two) times daily as needed (shortness of breath).     Derrill Memo ON 01/13/2020] tamsulosin (FLOMAX) 0.4 MG CAPS capsule Take 1 capsule (0.4 mg total) by mouth daily after breakfast. 30 capsule 0  . timolol (TIMOPTIC) 0.5 % ophthalmic solution Place 1 drop into both eyes 2 (two) times daily.     . Travoprost, BAK Free, (TRAVATAN) 0.004 % SOLN ophthalmic solution Place  1 drop into both eyes at bedtime.       Drug Regimen Review  Drug regimen was reviewed and remains appropriate with no significant issues identified  Home: Home Living Family/patient expects to be discharged to:: Unsure Living Arrangements:  (unknown at this time)   Functional History:    Functional Status:  Mobility:          ADL:    Cognition: Cognition Orientation Level: Oriented to person,Oriented to place,Oriented to situation,Disoriented to time     Blood pressure 112/64, pulse (!) 109, temperature 98.5 F (36.9 C), resp. rate 16, height 5\' 9"  (1.753 m), weight 71 kg, SpO2 98 %. Physical Exam Vitals and nursing note reviewed.  Constitutional:      Appearance: Normal appearance.     Comments: Ill appearing and dry appearing pt sitting up in bed- has O2 by [place] 2L- RR 24 and P-110 at rest- doing nothing, BP 103/65, NAD Rating pain 2-3/10 right now  HENT:     Head: Normocephalic and atraumatic.     Comments: Right forehead with soft mass--lipoma? O2 by - 2L- sats 96% Smile equal    Right Ear: External ear normal.      Left Ear: External ear normal.     Nose: Nose normal. No congestion.     Mouth/Throat:     Mouth: Mucous membranes are dry.     Pharynx: Oropharynx is clear. No oropharyngeal exudate.  Eyes:     General:        Right eye: No discharge.        Left eye: No discharge.     Extraocular Movements: Extraocular movements intact.  Neck:     Comments: Posterior neck/upper back - incision- a little boggy  At base, but no erythema- little drainage Cardiovascular:     Rate and Rhythm: Regular rhythm. Tachycardia present.     Heart sounds: Normal heart sounds. No murmur heard.     Comments: Heart rate 112 at rest.  Pulmonary:     Comments: CTA B/L- no W/R/R- good air movement Abdominal:     Palpations: Abdomen is soft.     Comments: Well healed incision on abdomen with hernia? Soft, NT, ND, (+)BS -hypoactive  Genitourinary:    Comments: Foley in place- medium amber urine Musculoskeletal:     Comments: UEs 5-/5 in deltoids, biceps, triceps, WE, grip and finger abd B/L LEs- 3+/5 in HF, KE 4/5, DF and PF 4+/5   Skin:    General: Skin is warm and dry.     Comments: Bad tenting showing dehydration No wound on backside/heels IV L forearm looks OK  Neurological:     Mental Status: He is alert.     Comments: Decreased/absent sensation to light touch from L1-S5 B/L Ox3, but higher level cognition/memory appears impaired  Psychiatric:        Behavior: Behavior normal.     Comments: Appropriate, quiet     Results for orders placed or performed during the hospital encounter of 12/30/19 (from the past 48 hour(s))  Basic metabolic panel     Status: Abnormal   Collection Time: 01/12/20 11:36 AM  Result Value Ref Range   Sodium 137 135 - 145 mmol/L   Potassium 4.8 3.5 - 5.1 mmol/L   Chloride 98 98 - 111 mmol/L   CO2 31 22 - 32 mmol/L   Glucose, Bld 143 (H) 70 - 99 mg/dL    Comment: Glucose reference range applies only to samples taken after fasting  for at least 8 hours.   BUN 14 8 - 23 mg/dL    Creatinine, Ser 1.01 0.61 - 1.24 mg/dL   Calcium 10.4 (H) 8.9 - 10.3 mg/dL   GFR, Estimated >60 >60 mL/min    Comment: (NOTE) Calculated using the CKD-EPI Creatinine Equation (2021)    Anion gap 8 5 - 15    Comment: Performed at [facility] [address], [place], [place] [address]  CBC with Differential/Platelet     Status: Abnormal   Collection Time: 01/12/20 11:36 AM  Result Value Ref Range   WBC 6.9 4.0 - 10.5 K/uL   RBC 4.06 (L) 4.22 - 5.81 MIL/uL   Hemoglobin 11.6 (L) 13.0 - 17.0 g/dL   HCT 37.0 (L) 39.0 - 52.0 %   MCV 91.1 80.0 - 100.0 fL   MCH 28.6 26.0 - 34.0 pg   MCHC 31.4 30.0 - 36.0 g/dL   RDW 12.3 11.5 - 15.5 %   Platelets 266 150 - 400 K/uL   nRBC 0.0 0.0 - 0.2 %   Neutrophils Relative % 70 %   Neutro Abs 4.8 1.7 - 7.7 K/uL   Lymphocytes Relative 21 %   Lymphs Abs 1.4 0.7 - 4.0 K/uL   Monocytes Relative 7 %   Monocytes Absolute 0.5 0.1 - 1.0 K/uL   Eosinophils Relative 1 %   Eosinophils Absolute 0.1 0.0 - 0.5 K/uL   Basophils Relative 0 %   Basophils Absolute 0.0 0.0 - 0.1 K/uL   Immature Granulocytes 1 %   Abs Immature Granulocytes 0.04 0.00 - 0.07 K/uL    Comment: Performed at [facility] [address], [place], [place] [address]   DG Abd 1 View  Result Date: 01/12/2020 CLINICAL DATA:  Abdominal pain, constipation EXAM: ABDOMEN - 1 VIEW COMPARISON:  None. FINDINGS: Supine frontal view of the abdomen and pelvis excludes the pubic symphysis by collimation. No bowel obstruction or ileus. Significant retained stool throughout the colon consistent with constipation. Multiple surgical clips throughout the upper abdomen. Curvilinear calcification right mid abdomen of uncertain etiology, but could be associated with the right kidney. No acute bony abnormalities. IMPRESSION: 1. Significant fecal retention compatible with constipation. 2. Curvilinear calcification right mid abdomen, of uncertain etiology. This could be related to the right kidney. If  further evaluation is desired, ultrasound or CT could be performed. Electronically Signed   By: Randa Ngo M.D.   On: 01/12/2020 17:17   VAS [place] LOWER EXTREMITY VENOUS (DVT)  Result Date: 01/12/2020  Lower Venous DVT Study Other Indications: Immobility. Comparison Study: No previous scan Performing Technologist: Vonzell Schlatter RVT  Examination Guidelines: A complete evaluation includes B-mode imaging, spectral Doppler, color Doppler, and power Doppler as needed of all accessible portions of each vessel. Bilateral testing is considered an integral part of a complete examination. Limited examinations for reoccurring indications may be performed as noted. The reflux portion of the exam is performed with the patient in reverse Trendelenburg.  +---------+---------------+---------+-----------+----------+--------------+ RIGHT    CompressibilityPhasicitySpontaneityPropertiesThrombus Aging +---------+---------------+---------+-----------+----------+--------------+ CFV      Full           Yes      Yes                                 +---------+---------------+---------+-----------+----------+--------------+ SFJ      Full                                                        +---------+---------------+---------+-----------+----------+--------------+  FV Prox  Full                                                        +---------+---------------+---------+-----------+----------+--------------+ FV Mid   Full                                                        +---------+---------------+---------+-----------+----------+--------------+ FV DistalFull                                                        +---------+---------------+---------+-----------+----------+--------------+ PFV      Full                                                        +---------+---------------+---------+-----------+----------+--------------+ POP      Full           Yes      Yes                                  +---------+---------------+---------+-----------+----------+--------------+ PTV      Full                                                        +---------+---------------+---------+-----------+----------+--------------+ PERO     Full                                                        +---------+---------------+---------+-----------+----------+--------------+   +---------+---------------+---------+-----------+----------+--------------+ LEFT     CompressibilityPhasicitySpontaneityPropertiesThrombus Aging +---------+---------------+---------+-----------+----------+--------------+ CFV      Full           Yes      Yes                                 +---------+---------------+---------+-----------+----------+--------------+ SFJ      Full                                                        +---------+---------------+---------+-----------+----------+--------------+ FV Prox  Full                                                        +---------+---------------+---------+-----------+----------+--------------+  FV Mid   Full                                                        +---------+---------------+---------+-----------+----------+--------------+ FV DistalFull                                                        +---------+---------------+---------+-----------+----------+--------------+ PFV      Full                                                        +---------+---------------+---------+-----------+----------+--------------+ POP      Full           Yes      Yes                                 +---------+---------------+---------+-----------+----------+--------------+ PTV      Full                                                        +---------+---------------+---------+-----------+----------+--------------+ PERO     Full                                                         +---------+---------------+---------+-----------+----------+--------------+     Summary: RIGHT: - There is no evidence of deep vein thrombosis in the lower extremity.  - No cystic structure found in the popliteal fossa.  LEFT: - There is no evidence of deep vein thrombosis in the lower extremity.  - No cystic structure found in the popliteal fossa.  *See table(s) above for measurements and observations. Electronically signed by Harold Barban MD on 01/12/2020 at 66:05:45 PM.    Final        Medical Problem List and Plan: 1.  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  Antithrombotics: -DVT/anticoagulation:  Mechanical: Sequential compression devices, below knee Bilateral lower extremities Will order dopplers due to immobility/tachcyardia. Dopplers (-)- has been 10 days since surgery- will check with NSU if can start Lovenox asap- high risk   -antiplatelet therapy: N/A 3. Pain Management: will continue Oxycodone prn.  4. Mood: LCSW to follow for evaluation and support.   -antipsychotic agents: N/a 5. Neuropsych: This patient is capable of making decisions on his own behalf. 6. Skin/Wound Care: Routine pressure relief measures.  7. Fluids/Electrolytes/Nutrition: Monitor I/O. Check electrolytes in am.   --Noted  to be malnourished--albumin 2.2. Will add juven to promote healing.  8. 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   --Continue foley for few more days till bowel program set and UTI treated.  9.  Acute renal failure: Likely due to retention/UTI.  --Labs ordered today and pending  --will check follow up labs in am.  10. Acute blood loss anemia: Will recheck H/H in am.  11. Metastatic Lung AdenoCA:  On dulera--has been on supplemental  oxygen.   --Plans for simulation on 12/27 with MD appt.  12.  Kleb/Proteus UTI: Started on Cipro Day #1/5 13. Resting tachycardia: Will order EKG for baseline as continues to have intermittent left chest pain (question due to neuropathy). 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 Will   order KUB to evaluate stool burden. On multiple laxative throughout the day--will change senna to 2 tabs in am and at noon. Discontinue Miralax/colace for now till regimen evaluated. Schedule suppository at nights with bowel program  14. 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 Will order orthostatic BP. Will likely need TEDs/binder for support.  15. Insomnia: Will schedule Melatonin at nights.  16. Neurogenic bladder- has foley due to urinary rentention- will start Flomax after supper, since having orthostatic hypotension.  17. Tenting/mild dehydration- will push 6 cups/water daily minimum   Bary Leriche- PA-C 01/12/2020   I have personally performed a face to face diagnostic evaluation of this patient and formulated the key components of the plan.  Additionally, I have personally reviewed laboratory data, imaging studies, as well as relevant notes and concur with the physician assistant's documentation above.    Courtney Heys, MD 01/12/2020

## 2020-01-12 NOTE — Progress Notes (Signed)
Bilateral lower extremity venous study completed.      Please see CV Proc for preliminary results.   Sharren Schnurr, RVT  

## 2020-01-12 NOTE — Progress Notes (Signed)
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 Arnoldo Morale     HPI: Gregory Bautista is a 79 y.o. 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  Tumor not felt to be resectable due to advanced disease and spine stabilization recommended with plans for chemo/XRT for treatment. He was evaluated by Dr. Arnoldo Morale and was admitted on 12/30/19 for T2-T4 laminectomy for tumor resection with T1-T5 posterior arthrodesis with pedicle screws and rods. Plans for 3 weeks of XRT by Dr. Marla Roe set for 12/27.  He has had issues with urinary retention requiring I/O caths  threfore foley placed yesterday.   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 CIR recommended due to functional decline.     Has been going down for the past three months with severe pain in the past month--"lying in bed and getting weaker and weaker".  Family from [place] has been assisting for the past 2 months--managing home/meals.      Review of Systems  Constitutional: Negative for chills and fever.  HENT: Positive for hearing loss.   Eyes: Positive for blurred vision (Right eye due to injury.  Left eye problems for 2-3 years). Negative for double vision.  Respiratory: Positive for shortness of breath. Negative for cough.   Cardiovascular: Positive for chest pain.  Gastrointestinal: Positive for constipation. Negative for heartburn and nausea.  Musculoskeletal: Positive for myalgias.  Skin: Negative for rash.  Neurological: Positive for tingling (bilateral feet X 1 year) and  headaches (occasionally ).            Past Medical History:  Diagnosis Date  . Allergic rhinitis      NEC  . Asthma    . Glaucoma    . 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 Tobacco use disorder 09/24/2019      Past Surgical History:  Procedure Laterality Date  . COLON SURGERY      . EYE SURGERY Right    . LAMINECTOMY WITH POSTERIOR LATERAL ARTHRODESIS LEVEL 4 N/A 12/30/2019    Procedure: THORACIC THREE LAMINECTOMY, THORACIC INSTRUMENTATION AND FUSION THORACIC ONE-FIVE;  Surgeon: Newman Pies, MD;  Location: [facility];  Service: Neurosurgery;  Laterality: N/A;           Family History  Problem Relation Age of Onset  . Cancer Mother    . Heart attack Father    . Cancer Brother        Social History:  Lives alone--family lives out of state. Brother from [place] here to help for a short time. 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 He reports that he quit smoking about 3 months ago. His smoking use included cigarettes--about on pack per day.  He started smoking about 64 years ago. He has a 30.00 pack-year smoking history. He has never used smokeless tobacco. He reports that he does not drink alcohol and does not use drugs.          Allergies  Allergen Reactions  . Penicillins Anaphylaxis      Has patient had a  PCN reaction causing immediate rash, facial/tongue/throat swelling, SOB or lightheadedness with hypotension: Yes Has patient had a PCN reaction causing severe rash involving mucus membranes or skin necrosis: No Has patient had a PCN reaction that required hospitalization No Has patient had a PCN reaction occurring within the last 10 years: Yes If all of the above answers are "NO", then may proceed with Cephalosporin use.    . Wellbutrin [Bupropion] Other (See Comments)      Seizures  . Morphine And Related Itching            Medications Prior to Admission  Medication Sig Dispense Refill  . albuterol (VENTOLIN HFA) 108 (90 Base) MCG/ACT  inhaler Inhale 2 puffs into the lungs every 6 (six) hours as needed for wheezing or shortness of breath.      [name] aspirin EC 81 MG tablet Take 81 mg by mouth daily. Swallow whole.      . cholecalciferol (VITAMIN D3) 25 MCG (1000 UNIT) tablet Take 1,000 Units by mouth daily.      . diclofenac Sodium (VOLTAREN) 1 % GEL Apply 1 application topically 4 (four) times daily as needed (pain).       [name] gabapentin (NEURONTIN) 100 MG capsule Take 100 mg by mouth 3 (three) times daily.      [name] ipratropium (ATROVENT) 0.06 % nasal spray Place 2 sprays into both nostrils daily as needed for rhinitis.      . Multiple Vitamin (MULTIVITAMIN WITH MINERALS) TABS tablet Take 1 tablet by mouth daily.      . SYMBICORT 160-4.5 MCG/ACT inhaler Inhale 1 puff into the lungs 2 (two) times daily as needed (shortness of breath).       . timolol (TIMOPTIC) 0.5 % ophthalmic solution Place 1 drop into both eyes 2 (two) times daily.       . Travoprost, BAK Free, (TRAVATAN) 0.004 % SOLN ophthalmic solution Place 1 drop into both eyes at bedtime.           Home: Home Living Family/patient expects to be discharged to:: Private residence Living Arrangements: Other relatives (brother) Available Help at Discharge: Available 24 hours/day,Family (Brother) Type of Home: House Home Access: Stairs to enter Technical brewer of Steps: 4 Entrance Stairs-Rails: [place]: One level Bathroom Shower/Tub: Engineer, petroleum: Standard Bathroom Accessibility: Yes Home Equipment: Walker - 4 wheels,Cane - single point,Hand held shower head Additional Comments: Patient is concerned that his brother will not be able to provide current level of assist necessary for completion of ADLs.  Functional History: Prior Function Level of Independence: Independent with assistive device(s) Comments: Use of rollator in home and community dwellings. Patient was Mod I with ADLs/IADLs. Functional Status:  Mobility: Bed  Mobility Overal bed mobility: Needs Assistance Bed Mobility: Rolling,Sidelying to Sit,Sit to Sidelying Rolling: Min guard Sidelying to sit: Min guard,HOB elevated Sit to sidelying: Mod assist,HOB elevated General bed mobility comments: Cues to log roll to maintain spinal precautions with success, min guard for safety. Cued pt to bring legs off bed while trunk ascends, with extra time and min guard to complete. Returning to sidelying from sit pt required modA to manage legs with cues to descend onto elbow as legs rise. Transfers Overall transfer level: Needs assistance Equipment used: Rolling walker (2 wheeled) Transfers: Sit to/from [employer] Sit to Stand: Min assist Stand pivot transfers: Min assist General transfer comment: Extra time and minA to steady pt with power up to stand. UE support on therapist to stand step  bed <> commode this date with cues for sequencing steps. Ambulation/Gait Ambulation/Gait assistance: Min assist Gait Distance (Feet): 3 Feet (x2 bouts) Assistive device: Rolling walker (2 wheeled) Gait Pattern/deviations: Step-through pattern,Decreased stride length,Narrow base of support,Decreased stance time - right,Decreased step length - left General Gait Details: Several side steps with bilat UE support on therapist to transfer between surfaces, minA for steadying and cues to sequence steps. Knee buckling noted. Gait velocity: decreased Gait velocity interpretation: <1.31 ft/sec, indicative of household ambulator   ADL: ADL Overall ADL's : Needs assistance/impaired Eating/Feeding: Set up,Sitting Grooming: Minimal assistance,Standing Upper Body Dressing : Set up,Sitting Lower Body Dressing: Minimal assistance,Sit to/from stand,Sitting/lateral leans Toilet Transfer: Minimal assistance,RW Toileting- Clothing Manipulation and Hygiene: Minimal assistance,Sit to/from stand Functional mobility during ADLs: Min guard,Rolling walker    Cognition: Cognition Overall Cognitive Status: Within Functional Limits for tasks assessed Orientation Level: Oriented X4 Cognition Arousal/Alertness: Awake/alert Behavior During Therapy: WFL for tasks assessed/performed Overall Cognitive Status: Within Functional Limits for tasks assessed General Comments: A&Ox4. Pt aware of deficits.     Blood pressure (!) 98/56, pulse (!) 104, temperature 97.6 F (36.4 C), temperature source Axillary, resp. rate 15, height 5\' 9"  (1.753 m), weight 72.1 kg, SpO2 96 %. Physical Exam Vitals and nursing note reviewed.  Constitutional:      Appearance: Normal appearance. He is normal weight.     Interventions: Nasal cannula in place.     Comments: WNWD male, NAD. Soft mass right forehead--lipoma?  HENT:     Head: Normocephalic and atraumatic.  Eyes:     Extraocular Movements: Extraocular movements intact.     Conjunctiva/sclera: Conjunctivae normal.     Pupils: Pupils are equal, round, and reactive to light.  Cardiovascular:     Rate and Rhythm: Regular rhythm. Tachycardia present.     Heart sounds: Normal heart sounds. No murmur heard.    Pulmonary:     Effort: Pulmonary effort is normal. No respiratory distress.     Breath sounds: Normal breath sounds.  Abdominal:     General: Abdomen is flat. Bowel sounds are normal. There is no distension.     Palpations: Abdomen is soft.     Tenderness: There is no abdominal tenderness.  Skin:    General: Skin is warm and dry.  Neurological:     Mental Status: He is alert and oriented to person, place, and time.     Cranial Nerves: Cranial nerves are intact.     Sensory: Sensation is intact.     Coordination: Coordination is intact.     Comments: Normal light touch sensation in bilateral upper and lower limbs Normal coordination bilateral extremities Lower extremity strength 3 - bilateral hip flexors 4 bilateral knee extensors 3+ bilateral ankle dorsiflexors and plantar flexors  Psychiatric:         Mood and Affect: Mood normal.        Behavior: Behavior normal.        Lab Results Last 24 Hours       Results for orders placed or performed during the hospital encounter of 12/30/19 (from the past 24 hour(s))  Comprehensive metabolic panel     Status: Abnormal    Collection Time: 01/03/20  1:47 PM  Result Value Ref Range    Sodium 139 135 - 145 mmol/L    Potassium 3.9 3.5 - 5.1 mmol/L    Chloride 102 98 - 111 mmol/L    CO2 28 22 - 32 mmol/L    Glucose, Bld 67 (L) 70 -  99 mg/dL    BUN 37 (H) 8 - 23 mg/dL    Creatinine, Ser 1.54 (H) 0.61 - 1.24 mg/dL    Calcium 9.7 8.9 - 10.3 mg/dL    Total Protein 5.6 (L) 6.5 - 8.1 g/dL    Albumin 2.2 (L) 3.5 - 5.0 g/dL    AST 37 15 - 41 U/L    ALT 29 0 - 44 U/L    Alkaline Phosphatase 68 38 - 126 U/L    Total Bilirubin 0.5 0.3 - 1.2 mg/dL    GFR, Estimated 46 (L) >60 mL/min    Anion gap 9 5 - 15  CBC     Status: Abnormal    Collection Time: 01/03/20  1:47 PM  Result Value Ref Range    WBC 5.9 4.0 - 10.5 K/uL    RBC 3.53 (L) 4.22 - 5.81 MIL/uL    Hemoglobin 10.5 (L) 13.0 - 17.0 g/dL    HCT 32.1 (L) 39.0 - 52.0 %    MCV 90.9 80.0 - 100.0 fL    MCH 29.7 26.0 - 34.0 pg    MCHC 32.7 30.0 - 36.0 g/dL    RDW 13.2 11.5 - 15.5 %    Platelets 189 150 - 400 K/uL    nRBC 0.0 0.0 - 0.2 %  Urinalysis, Routine w reflex microscopic Urine, Catheterized     Status: Abnormal    Collection Time: 01/03/20  6:37 PM  Result Value Ref Range    Color, Urine YELLOW YELLOW    APPearance HAZY (A) CLEAR    Specific Gravity, Urine 1.019 1.005 - 1.030    pH 5.0 5.0 - 8.0    Glucose, UA NEGATIVE NEGATIVE mg/dL    Hgb urine dipstick MODERATE (A) NEGATIVE    Bilirubin Urine NEGATIVE NEGATIVE    Ketones, ur NEGATIVE NEGATIVE mg/dL    Protein, ur 30 (A) NEGATIVE mg/dL    Nitrite NEGATIVE NEGATIVE    Leukocytes,Ua NEGATIVE NEGATIVE    RBC / HPF 11-20 0 - 5 RBC/hpf    WBC, UA 0-5 0 - 5 WBC/hpf    Bacteria, UA RARE (A) NONE SEEN    Squamous Epithelial / LPF 0-5 0  - 5      Imaging Results (Last 48 hours)  No results found.       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 Does the need for close, 24 hr/day medical supervision in concert with the patient's rehab needs make it unreasonable for this patient to be served in a less intensive setting? Yes 2. Co-Morbidities requiring supervision/potential complications: Neurogenic bladder, Pancoast tumor requiring radiation therapy 3. Due to bladder management, bowel management, safety, skin/wound care, disease management, medication administration, pain management and patient education, does the patient require 24 hr/day rehab nursing? Yes 4. Does the patient require coordinated care of a physician, rehab nurse, therapy disciplines of PT, OT, speech to address physical and functional deficits in the context of the above medical diagnosis(es)? Yes Addressing deficits in the following areas: balance, endurance, locomotion, strength, transferring, bowel/bladder control, bathing, dressing, feeding, grooming, toileting and psychosocial support 5. Can the patient actively participate in an intensive therapy program of at least 3 hrs of therapy per day at least 5 days per week? Yes 6. The potential for patient to make measurable gains while on inpatient rehab is excellent 7. Anticipated functional outcomes upon discharge from inpatient rehab are modified independent and supervision  with PT, modified independent and supervision  with OT, n/a with SLP. 8. Estimated rehab length of stay to reach the above functional goals is: 10-14d 9. Anticipated discharge destination: Home 10. Overall Rehab/Functional Prognosis: good   RECOMMENDATIONS: This patient's condition is appropriate for continued rehabilitative care in the following setting: CIR Patient has agreed to participate in recommended program. Yes Note that insurance prior authorization may be  required for reimbursement for recommended care.   Comment: ? If brother can assist post d/c     Bary Leriche, PA-C 01/04/2020  "I have personally performed a face to face diagnostic evaluation of this patient.  Additionally, I have reviewed and concur with the physician assistant's documentation above." Charlett Blake M.D. [facility] FAAPM&R (Neuromuscular Med) Diplomate Am Board of Electrodiagnostic Med Fellow Am Board of Interventional Pain

## 2020-01-12 NOTE — Progress Notes (Signed)
Inpatient Rehabilitation Medication Review by a Pharmacist  A complete drug regimen review was completed for this patient to identify any potential clinically significant medication issues.  Clinically significant medication issues were identified:  yes   Type of Medication Issue Identified Description of Issue Urgent (address now) Non-Urgent (address on AM team rounds) Plan Plan Accepted by Provider? (Yes / No / Pending AM Rounds)  Drug Interaction(s) (clinically significant)       Duplicate Therapy       Allergy       No Medication Administration End Date       Incorrect Dose       Additional Drug Therapy Needed       Other  Oxycodone 5-10mg  q4h prn severe pain.  Need further clarification on when to give 5mg  vs 10mg  dose.  ASA 81mg  daily PTA held prior to surgery.  Not resumed.  Could not find any indication for ASA 81 for primary prophylaxis based on this patient's medical hx.  Will continue to hold.  Other PTA meds not resumed: Vit D, Diclofenac Gel, MVI.  Not clinically significant.  Messaged PA re: Oxycodone clarification Pending AM rounds    Name of provider notified for urgent issues identified: n/a  Provider Method of Notification: n/a   For non-urgent medication issues to be resolved on team rounds tomorrow morning a [facility] Secure Chat Handoff was sent to: Algis Liming   Pharmacist comments: Need clarification of PRN indication for Oxycodone range order.  Time spent performing this drug regimen review (minutes):  20   Husayn Reim, Rocky Crafts 01/12/2020 5:17 PM

## 2020-01-12 NOTE — Progress Notes (Signed)
Patient arrived to unit, no s/s of distress noted at this time.  Audie Clear, LPN

## 2020-01-12 NOTE — Discharge Instructions (Signed)
Wound Care Keep incision covered and dry for two days.    Do not put any creams, lotions, or ointments on incision. Leave steri-strips on back.  They will fall off by themselves. Activity Walk each and every day, increasing distance each day. No lifting greater than 5 lbs.  No driving for 2 weeks; may ride as a passenger locally.  Diet Resume your normal diet.  Return to Work Will be discussed at your follow up appointment. Call Your Doctor If Any of These Occur Redness, drainage, or swelling at the wound.  Temperature greater than 101 degrees. Severe pain not relieved by pain medication. Incision starts to come apart. Follow Up Appt Call today for appointment in 1-2 weeks ([phone]) or for problems.  If you have any hardware placed in your spine, you will need an x-ray before your appointment.  

## 2020-01-12 NOTE — Progress Notes (Signed)
Inpatient Rehab Admissions Coordinator:   I have a bed available for pt to admit to CIR today.  Meghan (NP with neurosurgery) in agreement.  Will let pt/family and TOC team know.    Shann Medal, PT, DPT Admissions Coordinator [phone] 01/12/20  9:49 AM

## 2020-01-12 NOTE — Progress Notes (Signed)
PMR Admission Coordinator Pre-Admission Assessment   Patient: Gregory Bautista is an 79 y.o., male MRN: [id number] DOB: Feb 04, 1940 Height: _0  (175.3 cm) Weight: 72.1 kg                                                                                                                                                  Insurance Information HMO: yes    PPO:      PCP:      IPA:      80/20:      OTHER:  PRIMARY: [employer] Medicare           Policy#: [id number]      Subscriber: Pt.  CM Name:    Phone#:      Fax#: [phone] Pre-Cert#: [id number] auth for CIR given by Hollie Salk with [facility] Medicare with updates due to fax listed above on 12/27     Employer:    Benefits:  Phone #:  [email or web address]    Name:  Eff. Date: 09/23/2019 - present     Deduct:   $0 (does not have deductible)   Out of Pocket Max: $3,600 ($364.35 met) CIR: $295/day co-pay for days 1-5, $0/day co-pay for days 6+ SNF: $0/day co-pay for days 1-20, $184/day co-pay for days 21-40, $0/day co-pay for days 41-100; limited to 100 days/cal yr Outpatient: $30/visit co-pay; limited by medical necessity Home Health: 100% coverage, 0% co-insurance; limited by medical necessity DME: 80% coverage, 20% co-insurance Providers: in network  SECONDARY: n/a     The "Data Collection Information Summary" for patients in Inpatient Rehabilitation Facilities with attached "Privacy Act [place] Records" was provided and verbally reviewed with: Patient and Family   Emergency Contact Information         Contact Information     Name Relation Home Work [place], [place] Brother     [phone]    Laurian Brim Daughter     [phone]    [facility] Brother     [phone]    Jeanella Flattery Daughter     [phone]       Current Medical History  Patient Admitting Diagnosis: Thorac Myelopathy History of Present Illness: Gregory Bautista is a 79 y.o. 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  Tumor not felt to be resectable due to advanced disease and spine stabilization recommended with plans for chemo/XRT for treatment. He was evaluated by Dr. Arnoldo Morale and was admitted on 12/30/19 for T2-T4 laminectomy for tumor resection with T1-T5 posterior arthrodesis with pedicle screws and rods.  Plans for 3 weeks of XRT by Dr. Marla Roe set for 12/27.  He has had issues with urinary retention requiring I/O caths  threfore foley placed yesterday.   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 CIR recommended due to functional decline.   Glasgow Coma Scale Score: 15   Past Medical History      Past Medical History:  Diagnosis Date  . Allergic rhinitis      NEC  . Asthma    . Glaucoma    . 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 Tobacco use disorder 09/24/2019      Family History  family history includes Cancer in his brother and mother; Heart attack in his father.   Prior Rehab/Hospitalizations:  Has the patient had prior rehab or hospitalizations prior to admission? No   Has the patient had major surgery during 100 days prior to admission? Yes   Current Medications    Current Facility-Administered Medications:  .  0.9 %  sodium chloride infusion, 250 mL, Intravenous, Continuous, Bergman, Meghan D, NP, Stopped at 12/30/19 2241 .  acetaminophen (TYLENOL) tablet 650 mg, 650 mg, Oral, Q4H PRN, 650 mg at 01/11/20 2302 **OR** acetaminophen (TYLENOL) suppository 650 mg, 650 mg, Rectal, Q4H PRN, Bergman, Meghan D, NP .  albuterol (VENTOLIN HFA) 108 (90 Base) MCG/ACT inhaler 2 puff, 2 puff, Inhalation, Q6H PRN, Newman Pies, MD .  bisacodyl (DULCOLAX) EC tablet  5-10 mg, 5-10 mg, Oral, Daily PRN, Newman Pies, MD, 10 mg at 01/06/20 0901 .  bisacodyl (DULCOLAX) suppository 10 mg, 10 mg, Rectal, Daily PRN, Viona Gilmore D, NP .  Chlorhexidine Gluconate Cloth 2 % PADS 6 each, 6 each, Topical, Daily, Newman Pies, MD, 6 each at 01/11/20 1030 .  ciprofloxacin (CIPRO) tablet 500 mg, 500 mg, Oral, BID, Newman Pies, MD .  cyclobenzaprine (FLEXERIL) tablet 10 mg, 10 mg, Oral, TID PRN, Viona Gilmore D, NP, 10 mg at 01/11/20 2302 .  docusate sodium (COLACE) capsule 100 mg, 100 mg, Oral, BID, Bergman, Meghan D, NP, 100 mg at 01/11/20 2302 .  gabapentin (NEURONTIN) capsule 100 mg, 100 mg, Oral, TID, Newman Pies, MD, 100 mg at 01/11/20 2302 .  ipratropium (ATROVENT) 0.06 % nasal spray 2 spray, 2 spray, Each Nare, TID, Newman Pies, MD, 2 spray at 01/11/20 2304 .  latanoprost (XALATAN) 0.005 % ophthalmic solution 1 drop, 1 drop, Both Eyes, QHS, Newman Pies, MD, 1 drop at 01/11/20 2304 .  menthol-cetylpyridinium (CEPACOL) lozenge 3 mg, 1 lozenge, Oral, PRN **OR** phenol (CHLORASEPTIC) mouth spray 1 spray, 1 spray, Mouth/Throat, PRN, Bergman, Meghan D, NP .  mometasone-formoterol (DULERA) 200-5 MCG/ACT inhaler 2 puff, 2 puff, Inhalation, BID, Newman Pies, MD, 2 puff at 01/12/20 0856 .  morphine 4 MG/ML injection 4 mg, 4 mg, Intravenous, Q2H PRN, Bergman, Meghan D, NP .  multivitamin with minerals tablet 1 tablet, 1 tablet, Oral, Daily, Newman Pies, MD, 1 tablet at 01/11/20 1029 .  ondansetron (ZOFRAN) tablet 4 mg, 4 mg, Oral, Q6H PRN **OR** ondansetron (ZOFRAN) injection 4 mg, 4 mg, Intravenous, Q6H PRN, Bergman, Meghan D, NP .  oxyCODONE (Oxy IR/ROXICODONE) immediate release tablet 10 mg, 10 mg, Oral, Q3H PRN, Viona Gilmore D, NP, 10 mg at 01/08/20 2147 .  oxyCODONE (Oxy IR/ROXICODONE) immediate release tablet 5 mg, 5 mg, Oral, Q3H PRN, Viona Gilmore D, NP, 5 mg at 01/11/20 2302 .  polyethylene glycol (MIRALAX / GLYCOLAX) packet  17 g, 17 g, Oral, Daily, Newman Pies,  MD, 17 g at 01/11/20 1030 .  senna-docusate (Senokot-S) tablet 1 tablet, 1 tablet, Oral, BID, Newman Pies, MD, 1 tablet at 01/11/20 2301 .  sodium chloride flush (NS) 0.9 % injection 3 mL, 3 mL, Intravenous, Q12H, Bergman, Meghan D, NP, 3 mL at 01/11/20 2306 .  sodium chloride flush (NS) 0.9 % injection 3 mL, 3 mL, Intravenous, PRN, Bergman, Meghan D, NP .  tamsulosin (FLOMAX) capsule 0.4 mg, 0.4 mg, Oral, QPC breakfast, Eustace Moore, MD, 0.4 mg at 01/12/20 2202 .  timolol (TIMOPTIC) 0.5 % ophthalmic solution 1 drop, 1 drop, Both Eyes, BID, Newman Pies, MD, 1 drop at 01/11/20 2302   Patients Current Diet:     Diet Order                      Diet regular Room service appropriate? Yes; Fluid consistency: Thin  Diet effective now                      Precautions / Restrictions Precautions Precautions: Back,Fall Precaution Booklet Issued: Yes (comment) Precaution Comments: pt able to state all precautions during session Restrictions Weight Bearing Restrictions: No    Has the patient had 2 or more falls or a fall with injury in the past year?No   Prior Activity Level Limited Community (1-2x/wk): active   Prior Functional Level Prior Function Level of Independence: Independent with assistive device(s) Comments: Use of rollator in home and community dwellings. Patient was Mod I with ADLs/IADLs.   Self Care: Did the patient need help bathing, dressing, using the toilet or eating?  Independent   Indoor Mobility: Did the patient need assistance with walking from room to room (with or without device)? Independent   Stairs: Did the patient need assistance with internal or external stairs (with or without device)? Independent   Functional Cognition: Did the patient need help planning regular tasks such as shopping or remembering to take medications? Amber / Equipment Home Assistive  Devices/Equipment: Engineer, drilling (specify type),Cane (specify quad or straight) Home Equipment: Walker - 4 wheels,Cane - single point,Hand held shower head   Prior Device Use: Indicate devices/aids used by the patient prior to current illness, exacerbation or injury? None of the above   Current Functional Level Cognition   Overall Cognitive Status: Within Functional Limits for tasks assessed Orientation Level: Oriented X4 General Comments: distracted by pain    Extremity Assessment (includes Sensation/Coordination)   Upper Extremity Assessment: Generalized weakness  Lower Extremity Assessment: Defer to PT evaluation RLE Deficits / Details: MMT scores of 4- to 4 grossly throughout RLE Sensation: WNL RLE Coordination: WNL LLE Deficits / Details: MMT scores of 4- to 4 grossly throughout LLE Sensation: WNL LLE Coordination: WNL     ADLs   Overall ADL's : Needs assistance/impaired Eating/Feeding: Set up,Sitting Grooming: Oral care,Sitting,Supervision/safety,Set up,Minimal assistance Grooming Details (indicate cue type and reason): MIN to complete task as pt reports pain in chest needing to lay down, washed off pts dentures d/t pain however pt able to complete oral care from EOB with set- up before increase pain Upper Body Dressing : Minimal assistance,Sitting Upper Body Dressing Details (indicate cue type and reason): to don gown as back side cover Lower Body Dressing: Minimal assistance,Sit to/from stand,Sitting/lateral leans Lower Body Dressing Details (indicate cue type and reason): Min A to don footwear with cues for back precautions. Toilet Transfer: RW,Min Psychiatric nurse Details (indicate cue type and reason): Increased time/effort.  Patient reports morning stiffness. Toileting- Clothing Manipulation and Hygiene: Minimal assistance,Sit to/from stand Functional mobility during ADLs: Min guard (bed mobility only) General ADL Comments: pt limited by chest pain this session,  able to transiton to EOB with min guard via log roll technique for seated oral care however returned pt to supine d/t increased HR and reports of "burning" chest pain     Mobility   Overal bed mobility: Needs Assistance Bed Mobility: Supine to Sit Rolling: Min guard Sidelying to sit: Min guard Supine to sit: Supervision Sit to sidelying: Min assist General bed mobility comments: pt able to come safely to EOB without assist     Transfers   Overall transfer level: Needs assistance Equipment used: Rolling walker (2 wheeled) Transfers: Sit to/from Stand Sit to Stand: Min assist Stand pivot transfers: Min assist General transfer comment: mi A for power up with first stand but to steady for subsequent stands. Performed SPT with and without RW from bed to chair and chair to and from [facility].     Ambulation / Gait / Stairs / Wheelchair Mobility   Ambulation/Gait Ambulation/Gait assistance: Herbalist (Feet): 10 Feet Assistive device: Rolling walker (2 wheeled) Gait Pattern/deviations: Step-through pattern,Decreased stride length,Narrow base of support,Decreased stance time - right,Decreased step length - left General Gait Details: gait distance limited by pt beginning to have BM so went to [facility]. Pt with UE and LE shaking during ambulation. Worked on progressing fwd and bkwd stepping. HR 115 bpm Gait velocity: decreased Gait velocity interpretation: <1.31 ft/sec, indicative of household ambulator     Posture / Balance Dynamic Sitting Balance Sitting balance - Comments: able to sit EOB for grooming tasks ~ 6 mins Balance Overall balance assessment: Needs assistance Sitting-balance support: No upper extremity supported,Feet supported Sitting balance-Leahy Scale: Good Sitting balance - Comments: able to sit EOB for grooming tasks ~ 6 mins Standing balance support: Bilateral upper extremity supported,During functional activity Standing balance-Leahy Scale: Poor Standing balance  comment: Reliant on UE support on RW and min guard - minA for balance.     Special needs/care consideration Diabetic management no and Special service needs Pt. will need Chemo/rxt at d/c, Has foley currently        Previous Home Environment (from acute therapy documentation) Living Arrangements: Other relatives (brother) Available Help at Discharge: Available 24 hours/day,Family (Brother) Type of Home: House Home Layout: One level Home Access: Stairs to enter Entrance Stairs-Rails: Surveyor, mining of Steps: 4 Bathroom Shower/Tub: Paramedic: Yes Home Care Services: No Additional Comments: Patient is concerned that his brother will not be able to provide current level of assist necessary for completion of ADLs.   Discharge Living Setting Plans for Discharge Living Setting: Patient's home Type of Home at Discharge: House Discharge Home Layout: One level Discharge Home Access: Stairs to enter Entrance Stairs-Rails: Left,Right,Can reach both Entrance Stairs-Number of Steps: 3 Discharge Bathroom Shower/Tub: Tub/shower unit Discharge Bathroom Toilet: Standard Discharge Bathroom Accessibility: Yes How Accessible: Accessible via walker Does the patient have any problems obtaining your medications?: No   **As of 01/08/20, the patient's daughter [name], plans to have the patient live with her at DC (back in [place]); she will take FMLA and plans to be the 24/7 Supervision. Please give [name] as much heads up on the DC date as she will need to work on [employer] with her employer.    Social/Family/Support Systems Patient Roles: Other (Comment) has a daughter and brother up [employer]  Information: [name] [phone] Anticipated Caregiver: [name]  Anticipated Caregiver's Contact Information: [phone] Ability/Limitations of Caregiver: Can provide Supervision Caregiver Availability: 24/7 Discharge Plan Discussed with  Primary Caregiver: Yes Is Caregiver In Agreement with Plan?: Yes Does Caregiver/Family have Issues with Lodging/Transportation while Pt is in Rehab?: No     Goals Patient/Family Goal for Rehab: PT/OT Mod I/Supervision; SLP: NA Expected length of stay: 10-14 days Pt/Family Agrees to Admission and willing to participate: Yes Program Orientation Provided & Reviewed with Pt/Caregiver Including Roles  & Responsibilities: Yes   **SOCIAL WORK TEAM: Please follow up with Mont Dutton from [facility] about his scheduled PET Scan. They are trying to schedule it for Jan 3rd-Please contact her if it does not look like he will be DC'd by that date. They need at least a 3 day heads up to reschedule. Her phone is: [phone]).    Decrease burden of Care through IP rehab admission: Specialzed equipment needs, Decrease number of caregivers, Bowel and bladder program and Patient/family education     Possible need for SNF placement upon discharge:not anticipated     Patient Condition: This patient's medical and functional status has changed since the consult dated: 01/04/20 in which the Rehabilitation Physician determined and documented that the patient's condition is appropriate for intensive rehabilitative care in an inpatient rehabilitation facility. See "History of Present Illness" (above) for medical update. Functional changes are: min assist x10'. Patient's medical and functional status update has been discussed with the Rehabilitation physician and patient remains appropriate for inpatient rehabilitation. Will admit to inpatient rehab today.   Preadmission Screen Completed By: Jhonnie Garner, OTR, with updates by Michel Santee, PT, 01/12/2020 9:51 AM    ______________________________________________________________________   Discussed status with Dr. Dagoberto Ligas on 01/12/20 at 9:52 AM  and received approval for admission today.   Admission Coordinator: Raechel Ache, time 9:52 AM Sudie Grumbling 01/12/20   With  day of admit updates provided by: Shann Medal          Cosigned by: Courtney Heys, MD at 01/12/2020 10:21 AM

## 2020-01-12 NOTE — Progress Notes (Signed)
Inpatient Rehabilitation  Patient information reviewed and entered into eRehab system by Zakkery Dorian M. Keesha Pellum, M.A., CCC/SLP, PPS Coordinator.  Information including medical coding, functional ability and quality indicators will be reviewed and updated through discharge.    

## 2020-01-12 NOTE — TOC Transition Note (Signed)
Transition of Care [facility]) - CM/SW Discharge Note   Patient Details  Name: Gregory Bautista MRN: [id number] Date of Birth: 11-23-1940  [facility] CM/SW Contact:  Pollie Friar, RN Phone Number: 01/12/2020, 10:16 AM   Clinical Narrative:    Pt is discharging to CIR today. CM signing off.    Final next level of care: IP Rehab Facility Barriers to Discharge: No Barriers Identified   Patient Goals and CMS Choice     Choice offered to / list presented to : Patient  Discharge Placement                       Discharge Plan and Services   Discharge Planning Services: CM Consult Post Acute Care Choice: IP Rehab                               Social Determinants of Health (SDOH) Interventions     Readmission Risk Interventions No flowsheet data found.

## 2020-01-12 NOTE — Discharge Summary (Signed)
Physician Discharge Summary     Providing Compassionate, Quality Care - Together   Patient ID: Gregory Bautista MRN: [id number] DOB/AGE: 1940/09/05 79 y.o.  Admit date: 12/30/2019 Discharge date: 01/12/2020  Admission Diagnoses: Thoracic spine tumor, spinal stenosis of lumbar region  Discharge Diagnoses:  Active Problems:   Thoracic spine tumor   Spinal stenosis of lumbar region   Discharged Condition: good  Hospital Course: Patient underwent a T2-3, T3-4 laminectomy for resction of epidural spinal tumor, followed by T1-2, T2-3, T3-4, and T4-5 posterior arthrodesis. This was performed by Dr. Arnoldo Morale on 12/30/2019. His postoperative course has been complicated by issues with mobility, urinary retention, and urinary tract infection. He has worked with both physical and occupational therapies who are recommending inpatient rehabilitation. He is tolerating a normal diet. He is having some issue with urinary retention, for which he was self-catheterizing prior to admission. At present, he has a Foley catheter. His pain is well-controlled with oral pain medication. He is ready for discharge to [facility] inpatient rehab.   Consults: rehabilitation medicine  Significant Diagnostic Studies: radiology: DG Chest 2 View  Result Date: 12/04/2019 CLINICAL DATA:  Hemoptysis, former smoker, hypertension, history breast cancer EXAM: CHEST - 2 VIEW COMPARISON:  03/24/2015 FINDINGS: Normal heart size and pulmonary vascularity. RIGHT apex scarring. New extensive opacity at the medial LEFT upper lobe approximately 9.1 x 5.5 cm in size with pleuroparenchymal opacity, volume loss in in adjacent LEFT upper lobe, and associated destruction of the posterior LEFT third and fourth ribs and the LEFT transverse process of T3. Findings are compatible with a LEFT upper lobe neoplasm with chest wall and probable mediastinal invasion. Foci of scarring in LEFT mid lung. Underlying emphysematous and chronic bronchitic  changes. No pleural effusion or pneumothorax. 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 Findings discussed with Dr. Vanita Panda on 01/03/2020 at 1230 hours. Electronically Signed   By: Lavonia Dana M.D.   On: 12/04/2019 12:32   DG Thoracic Spine 2 View  Result Date: 12/30/2019 CLINICAL DATA:  T1 through T4 fusion. EXAM: DG C-ARM 1-60 MIN; THORACIC SPINE 2 VIEWS FLUOROSCOPY TIME:  Fluoroscopy Time:  2 minutes and 10 seconds. COMPARISON:  None. FINDINGS: Four C-arm fluoroscopic images were obtained intraoperatively and submitted for post operative interpretation. These images demonstrate placement of pedicle screws at T1, T2, T4, and T5. There is limited fluoroscopic visualization. Please see the performing provider's procedural report for further detail. IMPRESSION: Intraoperative fluoroscopic imaging, as detailed above. Electronically Signed   By: Margaretha Sheffield MD   On: 12/30/2019 20:03   CT Chest W Contrast  Result Date: 12/04/2019 CLINICAL DATA:  79 year old male with lung nodule. EXAM: CT CHEST WITH CONTRAST TECHNIQUE: Multidetector CT imaging of the chest was performed during intravenous contrast administration. CONTRAST:  71mL OMNIPAQUE IOHEXOL 300 MG/ML  SOLN COMPARISON:  Chest radiograph dated 12/04/2019. FINDINGS: Cardiovascular: There is no cardiomegaly or pericardial effusion. There is mild atherosclerotic calcification of the thoracic aorta. No aneurysmal dilatation or dissection. The central pulmonary arteries are unremarkable. Mediastinum/Nodes: No hilar or mediastinal adenopathy. Multiple small mildly rounded mediastinal nodes. No mediastinal fluid collection. Lungs/Pleura: There is a 7.7 x 5.6 cm left upper lobe pleural based mass extending into the upper mediastinum. The mass abuts the superior aspect of the aortic arch and left posterior wall of the upper  esophagus. 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 There is no pleural effusion pneumothorax.  The central airways are patent. Upper Abdomen: Subcentimeter left hepatic hypodense focus is too small to characterize. Multiple small bilateral renal hypodense lesions are not characterized. Musculoskeletal: There is erosive changes of the T2, T3, and T4 vertebra due to infiltration of the left upper lobe mass. There are destructive changes of the posterior left third and fourth ribs. There is probable extension of the mass into left T3-T4 neural foramina and central canal at T3 and T4 level. Further evaluation with MRI without and with contrast recommended. There is compression fracture of T3 with approximately 50% loss of vertebral body height, age indeterminate. IMPRESSION: 1. 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 Further evaluation with MRI without and with contrast recommended. 2. Aortic Atherosclerosis (ICD10-I70.0) and Emphysema (ICD10-J43.9). Electronically Signed   By: Anner Crete M.D.   On: 12/04/2019 16:38   DG C-Arm 1-60 Min  Result Date: 12/30/2019 CLINICAL DATA:  T1 through T4 fusion. EXAM: DG C-ARM 1-60 MIN; THORACIC SPINE 2 VIEWS FLUOROSCOPY TIME:  Fluoroscopy Time:  2 minutes and 10 seconds. COMPARISON:  None. FINDINGS: Four C-arm fluoroscopic images were obtained intraoperatively and submitted for post operative interpretation. These images demonstrate placement of pedicle screws at T1, T2, T4, and T5. There is limited fluoroscopic visualization. Please see the performing provider's procedural report for further detail. IMPRESSION: Intraoperative fluoroscopic imaging, as detailed above. Electronically Signed   By: Margaretha Sheffield MD   On: 12/30/2019 20:03     Treatments: surgery: T2-3 and T3-4 laminectomy for resection of epidural spinal spinal tumor; T1-2, T 2-3, T3-4 and T4-5 posterior arthrodesis with local autograft bone and  Zimmer bone graft extender; posterior thoracic instrumentation T1-T5 bilaterally with globus titanium pedicle screws and rods  Discharge Exam: Blood pressure 101/62, pulse (!) 108, temperature 98.6 F (37 C), temperature source Oral, resp. rate 17, height 5\' 9"  (1.753 m), weight 72.1 kg, SpO2 95 %.  Alert and oriented x 4 PERRLA CN II-XII grossly intact MAE, Strength and sensation intact Thoracic incision is healing well  Disposition: Discharge disposition: [place] Not Defined        Allergies as of 01/12/2020      Reactions   Penicillins Anaphylaxis   Has patient had a PCN reaction causing immediate rash, facial/tongue/throat swelling, SOB or lightheadedness with hypotension: Yes Has patient had a PCN reaction causing severe rash involving mucus membranes or skin necrosis: No Has patient had a PCN reaction that required hospitalization No Has patient had a PCN reaction occurring within the last 10 years: Yes If all of the above answers are "NO", then may proceed with Cephalosporin use.   Wellbutrin [bupropion] Other (See Comments)   Seizures   Morphine And Related Itching      Medication List    TAKE these medications   albuterol 108 (90 Base) MCG/ACT inhaler Commonly known as: VENTOLIN HFA Inhale 2 puffs into the lungs every 6 (six) hours as needed for wheezing or shortness of breath.   aspirin EC 81 MG tablet Take 81 mg by mouth daily. Swallow whole.   cholecalciferol 25 MCG (1000 UNIT) tablet Commonly known as: VITAMIN D3 Take 1,000 Units by mouth daily.   ciprofloxacin 500 MG tablet Commonly known as: CIPRO Take 1 tablet (500 mg total) by mouth 2 (two) times daily.   cyclobenzaprine 10 MG tablet Commonly known as: FLEXERIL Take 1 tablet (10 mg total) by mouth 3 (three) times daily as needed for muscle spasms.  diclofenac Sodium 1 % Gel Commonly known as: VOLTAREN Apply 1 application topically 4 (four) times daily as needed  (pain).   docusate sodium 100 MG capsule Commonly known as: COLACE Take 1 capsule (100 mg total) by mouth 2 (two) times daily.   gabapentin 100 MG capsule Commonly known as: NEURONTIN Take 100 mg by mouth 3 (three) times daily.   ipratropium 0.06 % nasal spray Commonly known as: ATROVENT Place 2 sprays into both nostrils daily as needed for rhinitis.   multivitamin with minerals Tabs tablet Take 1 tablet by mouth daily.   oxyCODONE 5 MG immediate release tablet Commonly known as: Oxy IR/ROXICODONE Take 1 tablet (5 mg total) by mouth every 4 (four) hours as needed for moderate pain ((score 4 to 6)).   polyethylene glycol 17 g packet Commonly known as: MIRALAX / GLYCOLAX Take 17 g by mouth daily.   senna-docusate 8.6-50 MG tablet Commonly known as: Senokot-S Take 1 tablet by mouth 2 (two) times daily.   Symbicort 160-4.5 MCG/ACT inhaler Generic drug: budesonide-formoterol Inhale 1 puff into the lungs 2 (two) times daily as needed (shortness of breath).   tamsulosin 0.4 MG Caps capsule Commonly known as: FLOMAX Take 1 capsule (0.4 mg total) by mouth daily after breakfast. Start taking on: January 13, 2020   timolol 0.5 % ophthalmic solution Commonly known as: TIMOPTIC Place 1 drop into both eyes 2 (two) times daily.   Travoprost (BAK Free) 0.004 % Soln ophthalmic solution Commonly known as: TRAVATAN Place 1 drop into both eyes at bedtime.       Follow-up Information    Newman Pies, MD. Schedule an appointment as soon as possible for a visit in 3 week(s).   Specialty: Neurosurgery Contact information: [address] [address] [place] [address] [phone]               Signed: Patricia Nettle 01/12/2020, 10:14 AM

## 2020-01-13 ENCOUNTER — Inpatient Hospital Stay (HOSPITAL_COMMUNITY): Payer: Medicare Other | Admitting: Occupational Therapy

## 2020-01-13 ENCOUNTER — Inpatient Hospital Stay (HOSPITAL_COMMUNITY): Payer: Medicare Other | Admitting: Physical Therapy

## 2020-01-13 ENCOUNTER — Inpatient Hospital Stay (HOSPITAL_COMMUNITY): Payer: Medicare Other

## 2020-01-13 LAB — CBC WITH DIFFERENTIAL/PLATELET
Abs Immature Granulocytes: 0.06 10*3/uL (ref 0.00–0.07)
Basophils Absolute: 0 10*3/uL (ref 0.0–0.1)
Basophils Relative: 0 %
Eosinophils Absolute: 0.1 10*3/uL (ref 0.0–0.5)
Eosinophils Relative: 1 %
HCT: 36.3 % — ABNORMAL LOW (ref 39.0–52.0)
Hemoglobin: 11.7 g/dL — ABNORMAL LOW (ref 13.0–17.0)
Immature Granulocytes: 1 %
Lymphocytes Relative: 26 %
Lymphs Abs: 2.2 10*3/uL (ref 0.7–4.0)
MCH: 29.1 pg (ref 26.0–34.0)
MCHC: 32.2 g/dL (ref 30.0–36.0)
MCV: 90.3 fL (ref 80.0–100.0)
Monocytes Absolute: 1 10*3/uL (ref 0.1–1.0)
Monocytes Relative: 12 %
Neutro Abs: 5.2 10*3/uL (ref 1.7–7.7)
Neutrophils Relative %: 60 %
Platelets: 255 10*3/uL (ref 150–400)
RBC: 4.02 MIL/uL — ABNORMAL LOW (ref 4.22–5.81)
RDW: 12.3 % (ref 11.5–15.5)
WBC: 8.5 10*3/uL (ref 4.0–10.5)
nRBC: 0 % (ref 0.0–0.2)

## 2020-01-13 LAB — COMPREHENSIVE METABOLIC PANEL
ALT: 29 U/L (ref 0–44)
AST: 26 U/L (ref 15–41)
Albumin: 2.3 g/dL — ABNORMAL LOW (ref 3.5–5.0)
Alkaline Phosphatase: 71 U/L (ref 38–126)
Anion gap: 9 (ref 5–15)
BUN: 14 mg/dL (ref 8–23)
CO2: 30 mmol/L (ref 22–32)
Calcium: 10.2 mg/dL (ref 8.9–10.3)
Chloride: 98 mmol/L (ref 98–111)
Creatinine, Ser: 0.96 mg/dL (ref 0.61–1.24)
GFR, Estimated: >60 mL/min (ref >60)
Glucose, Bld: 116 mg/dL — ABNORMAL HIGH (ref 70–99)
Potassium: 5.2 mmol/L — ABNORMAL HIGH (ref 3.5–5.1)
Sodium: 137 mmol/L (ref 135–145)
Total Bilirubin: 0.3 mg/dL (ref 0.3–1.2)
Total Protein: 6.7 g/dL (ref 6.5–8.1)

## 2020-01-13 MED ORDER — CEPHALEXIN 250 MG PO CAPS
500.0000 mg | ORAL_CAPSULE | Freq: Four times a day (QID) | ORAL | Status: AC
  Administered 2020-01-13 – 2020-01-16 (×13): 500 mg via ORAL
  Filled 2020-01-13 (×13): qty 2

## 2020-01-13 MED ORDER — CHLORHEXIDINE GLUCONATE CLOTH 2 % EX PADS
6.0000 | MEDICATED_PAD | Freq: Every day | CUTANEOUS | Status: DC
  Administered 2020-01-13 – 2020-02-15 (×20): 6 via TOPICAL

## 2020-01-13 MED ORDER — SODIUM ZIRCONIUM CYCLOSILICATE 5 G PO PACK
5.0000 g | PACK | Freq: Once | ORAL | Status: AC
  Administered 2020-01-13: 5 g via ORAL
  Filled 2020-01-13: qty 1

## 2020-01-13 NOTE — Evaluation (Signed)
Physical Therapy Assessment and Plan  Patient Details  Name: Gregory Bautista MRN: [id number] Date of Birth: [birth year]  PT Diagnosis: Abnormality of gait, Difficulty walking, Low back pain, Muscle weakness and Paraplegia Rehab Potential: Good ELOS: 18-21 days   Today's Date: 01/13/2020 PT Individual Time: 1300-1400 PT Individual Time Calculation (min): 60 min    Hospital Problem: Principal Problem:   Thoracic myelopathy   Past Medical History:  Past Medical History:  Diagnosis Date  . Allergic rhinitis    NEC  . Asthma   . Glaucoma   . 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 Tobacco use disorder 09/24/2019   Past Surgical History:  Past Surgical History:  Procedure Laterality Date  . COLON SURGERY    . EYE SURGERY Right   . 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  Tumor was not felt to be resectable due to advanced disease and spine stabilization recommended with plans for chemo and XRT in the future.  He was evaluated by Dr. JArnoldo Moraleand was admitted on 12/30/2019 for T2-T4 laminectomy for tumor resection with T1-T5 posterior arthrodesis with pedicle screws and rods.  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  Plans are for XRT by Dr. KRanda Ngowith reports to of simulation set for 12/27. Had  been set for follow up with Dr. MEarlie Serveron outpatient basis.   Hospital course has been significant for acute renal failure, overflow incontinence with bladder volumes up to 1250 ml and failed voiding trial therefore foley replaced as well as hypoxia with activity-->remains on supplemental oxygen.  Urine culture was done showing evidence of Proteus and Klebsiella UTI and he was started on Cipro today. He has had ongoing issues with resting tachycardia felt to be due to dehydration. Patient continues to be limited by BLE weakness with instability, left chest pain with burning and has been weaned off oxygen. He continues to have limitations in mobility and ADLS. CIR recommended due to functional decline. Patient transferred to CIR on 01/12/2020 .   Patient currently requires mod with mobility secondary to muscle weakness, decreased cardiorespiratoy endurance and decreased oxygen support and decreased standing balance, decreased postural control, decreased balance strategies and difficulty maintaining precautions.  Prior to hospitalization, patient was modified independent  with mobility and lived with Alone in a House home.  Home access is 4Stairs to enter.  Patient will benefit from skilled PT intervention to maximize safe functional mobility, minimize fall risk and decrease caregiver burden for planned discharge home alone with patient's brother only staying for 2-3 weeks following discharge from hospital.  Anticipate [facility]  PT - End of Session Activity Tolerance: Tolerates 30+ min activity with multiple rests Endurance Deficit: Yes Endurance Deficit Description: uses 2L of O2 for support PT Assessment Rehab Potential (ACUTE/IP ONLY): Good PT Barriers to Discharge: Decreased caregiver support;Home  environment access/layout;Incontinence;Neurogenic Bowel & Bladder;Lack of/limited family support;Pending chemo/radiation;New oxygen PT Patient demonstrates  impairments in the following area(s): Balance;Endurance;Motor;Pain;Safety;Sensory PT Transfers Functional Problem(s): Bed Mobility;Bed to Chair;Car;Furniture;Floor PT Locomotion Functional Problem(s): Ambulation;Wheelchair Mobility;Stairs PT Plan PT Intensity: Minimum of 1-2 x/day ,45 to 90 minutes PT Frequency: 5 out of 7 days PT Duration Estimated Length of Stay: 18-21 days PT Treatment/Interventions: Ambulation/gait training;Balance/vestibular training;Community reintegration;Discharge planning;DME/adaptive equipment instruction;Functional mobility training;Neuromuscular re-education;Pain management;Patient/family education;Psychosocial support;Stair training;Therapeutic Activities;Therapeutic Exercise;UE/LE Strength taining/ROM;UE/LE Coordination activities;Wheelchair propulsion/positioning PT Transfers Anticipated Outcome(s): Supervision PT Locomotion Anticipated Outcome(s): Supervision with LRAD PT Recommendation Recommendations for Other Services: Neuropsych consult;Therapeutic Recreation consult Therapeutic Recreation Interventions: Stress management Follow Up Recommendations: Home health PT Patient destination: Home Equipment Recommended: Rolling walker with 5" wheels;To be determined Equipment Details: further TBD pending progress   PT Evaluation Precautions/Restrictions Precautions Precautions: Back;Fall Precaution Comments: reviewed precautions Restrictions Weight Bearing Restrictions: No Home Living/Prior Functioning Home Living Available Help at Discharge: Available 24 hours/day;Family (brother can stay 2-3 weeks) Type of Home: House Home Access: Stairs to enter [employer] of Steps: 4 Entrance Stairs-Rails: Right;Left (cannot reach both at the same time) Home Layout: One level  Lives With: Alone Prior Function Level of Independence: Independent with gait;Independent with transfers;Requires assistive device for independence (SPC)  Able to Take Stairs?:  Yes Driving: Yes Vision/Perception  Perception Perception: Within Functional Limits Praxis Praxis: Intact  Cognition Overall Cognitive Status: Within Functional Limits for tasks assessed Arousal/Alertness: Awake/alert Orientation Level: Oriented X4 Attention: Focused Focused Attention: Appears intact Memory: Impaired Memory Impairment: Decreased short term memory Awareness: Appears intact Problem Solving: Appears intact Safety/Judgment: Appears intact Sensation Sensation Light Touch: Appears Intact Proprioception: Impaired Detail (impaired RLE>LLE) Coordination Gross Motor Movements are Fluid and Coordinated: No Fine Motor Movements are Fluid and Coordinated: Yes Coordination and Movement Description: slow from muscle weakness Motor  Motor Motor: Paraplegia;Abnormal postural alignment and control Motor - Skilled Clinical Observations: generalized muscle weakness  Trunk/Postural Assessment  Cervical Assessment Cervical Assessment: Within Functional Limits Thoracic Assessment Thoracic Assessment: Within Functional Limits Lumbar Assessment Lumbar Assessment: Within Functional Limits Postural Control Postural Control: Deficits on evaluation (impaired in standing)  Balance Balance Balance Assessed: Yes Static Sitting Balance Static Sitting - Balance Support: No upper extremity supported;Feet supported Static Sitting - Level of Assistance: 5: Stand by assistance Dynamic Sitting Balance Dynamic Sitting - Balance Support: No upper extremity supported;Feet supported;During functional activity Dynamic Sitting - Level of Assistance: 5: Stand by assistance Static Standing Balance Static Standing - Balance Support: Bilateral upper extremity supported;During functional activity Static Standing - Level of Assistance: 4: Min assist Dynamic Standing Balance Dynamic Standing - Balance Support: Bilateral upper extremity supported;During functional activity Dynamic Standing - Level  of Assistance: 3: Mod assist Extremity Assessment   RLE Assessment RLE Assessment: Exceptions to [facility] General Strength Comments: see below RLE Strength Right Hip Flexion: 3/5 Right Knee Flexion: 4/5 Right Knee Extension: 4/5 Right Ankle Dorsiflexion: 4/5 LLE Assessment LLE Assessment: Exceptions to [facility] General Strength Comments: see below LLE Strength Left Hip Flexion: 3/5 Left Knee Flexion: 4/5 Left Knee Extension: 4/5 Left Ankle Dorsiflexion: 4/5  Care Tool Care Tool Bed Mobility Roll left and right activity   Roll left and right assist level: Minimal Assistance - Patient > 75%    Sit to lying activity   Sit to lying assist level: Minimal Assistance - Patient > 75%    Lying to sitting edge of bed activity   Lying to sitting edge of bed assist level: Minimal Assistance - Patient >  75%     Care Tool Transfers Sit to stand transfer   Sit to stand assist level: Moderate Assistance - Patient 50 - 74%    Chair/bed transfer   Chair/bed transfer assist level: Moderate Assistance - Patient 50 - 74%     Toilet transfer   Assist Level: Moderate Assistance - Patient 50 - 74%    Car transfer   Car transfer assist level: Minimal Assistance - Patient > 75%      Care Tool Locomotion Ambulation   Assist level: 2 helpers Assistive device: Walker-rolling Max distance: 30'  Walk 10 feet activity   Assist level: 2 helpers Assistive device: Walker-rolling   Walk 50 feet with 2 turns activity Walk 50 feet with 2 turns activity did not occur: Safety/medical concerns      Walk 150 feet activity Walk 150 feet activity did not occur: Safety/medical concerns      Walk 10 feet on uneven surfaces activity Walk 10 feet on uneven surfaces activity did not occur: Safety/medical concerns      Stairs Stair activity did not occur: Safety/medical concerns        Walk up/down 1 step activity Walk up/down 1 step or curb (drop down) activity did not occur: Safety/medical concerns      Walk up/down 4 steps activity did not occuR: Safety/medical concerns  Walk up/down 4 steps activity      Walk up/down 12 steps activity Walk up/down 12 steps activity did not occur: Safety/medical concerns      Pick up small objects from floor Pick up small object from the floor (from standing position) activity did not occur: Safety/medical concerns      Wheelchair Will patient use wheelchair at discharge?:  (TBD) Type of Wheelchair: Manual   Wheelchair assist level: Supervision/Verbal cueing Max wheelchair distance: 100'  Wheel 50 feet with 2 turns activity   Assist Level: Supervision/Verbal cueing  Wheel 150 feet activity   Assist Level: Moderate Assistance - Patient 50 - 74%    Refer to Care Plan for Long Term Goals  SHORT TERM GOAL WEEK 1 PT Short Term Goal 1 (Week 1): Pt will perform least restrictive transfer with min A consistently PT Short Term Goal 2 (Week 1): Pt will ambulate x 50 ft with assist x 1 PT Short Term Goal 3 (Week 1): Pt will initiate stair training  Recommendations for other services: Neuropsych and Therapeutic Recreation  Stress management  Skilled Therapeutic Intervention Evaluation completed (see details above and below) with education on PT POC and goals and individual treatment initiated with focus on functional transfer and gait assessment, orientation to rehab unit and schedule, and discussion of estimated LOS and goals. Pt received seated in bed, agreeable to PT session. Pt reports pain in low to mid back and in L chest region. Nursing notified and able to provide patient with pain medication. Pt performs bed mobility at min A level. Sit to stand with mod A to RW. Stand pivot transfer to w/c with RW and mod A for balance. Manual w/c propulsion x 100 ft with use of BUE at Supervision level. Car transfer with min A and use of RW. Ambulation x 30 ft with RW with mod A for balance and close w/c follow from and 2nd person for safety. Pt exhibits BLE buckling  at end of ambulation. Pt initially on 2L O2 at rest, requires up to 3L O2 via [place] to keep SpO2 at 90% and higher with activity. Provided pt with taller back  w/c for improved support and sitting OOB tolerance. Pt requests to use bathroom at end of session. Stand pivot transfer w/c to elevated BSC over toilet with RW and mod A. Pt requires assist for clothing management in standing. Pt left seated in bathroom with call button in reach, NT notified of pt's location.  Mobility Bed Mobility Bed Mobility: Rolling Right;Rolling Left;Supine to Sit;Sit to Supine Rolling Right: Minimal Assistance - Patient > 75% Rolling Left: Minimal Assistance - Patient > 75% Supine to Sit: Minimal Assistance - Patient > 75% Sit to Supine: Minimal Assistance - Patient > 75% Transfers Transfers: Sit to [employer] Transfers Sit to Stand: Moderate Assistance - Patient 50-74% Stand Pivot Transfers: Moderate Assistance - Patient 50 - 74% Stand Pivot Transfer Details: Verbal cues for sequencing;Verbal cues for technique;Verbal cues for precautions/safety;Verbal cues for safe use of DME/AE Transfer (Assistive device): Rolling walker Locomotion  Gait Gait Distance (Feet): 30 Feet Assistive device: Rolling walker Gait Gait Pattern: Impaired (flexed knees and trunk, narrow BOS) Gait velocity: decreased Stairs / Additional Locomotion Stairs: No Architect: Yes Wheelchair Assistance: Chartered loss adjuster: Both upper extremities Wheelchair Parts Management: Needs assistance Distance: 100   Discharge Criteria: Patient will be discharged from PT if patient refuses treatment 3 consecutive times without medical reason, if treatment goals not met, if there is a change in medical status, if patient makes no progress towards goals or if patient is discharged from hospital.  The above assessment, treatment plan, treatment alternatives and goals were discussed and  mutually agreed upon: by patient   Excell Seltzer, PT, DPT 01/13/2020, 5:18 PM

## 2020-01-13 NOTE — Progress Notes (Signed)
Bowel suppository given, awaiting results. Two previous bowel movements on today. No complications noted at this time. Audie Clear, LPN

## 2020-01-13 NOTE — Progress Notes (Addendum)
Dig stim performed x2, with small bowel movement noted. Soft mushy stool noted.

## 2020-01-13 NOTE — Progress Notes (Signed)
Pharmacy Penicillin Allergy Clarification  Discusse narrowing ciprofloxacin to cephalexin per culture sensitivities and to prevent many potential side effects from ciprofloxacin in this 79 year old man with MD who agreed with narrowing.   Noted that patient has penicillin allergy listed in chart with anaphylaxis as a reaction. It was documented on 03/26/2012 and chart review did not reveal any further information on what happened. Patient has not received any antibiotics other than azithromycin prior to this admission in our EMR.   Spoke with patient, who states that he does not recall ever having a bad reaction to an antibiotic. Spoke with patient's daughter who provided the same information as listed in the medical record but does not remember the specific incidence. She recommended that I call patient's PCP who has taken care of him for many years (Dr. [name] [phone]). Called PCP office and spoke with an RN there who stated that they have been following Mr. Burget since 2013 and do no have any allergies listed for him. RN states that the only antibiotic they have given him since 2013 is doxycycline.   Discussed the above with ID pharmacist who agrees that the risk of cross-reaction with cephalexin is very low. Discussed with MD and asked RN to monitor for any allergic reactions.    Benetta Spar, PharmD, BCPS, BCCP Clinical Pharmacist  Please check AMION for all [facility] phone numbers After 10:00 PM, call [place] [phone]

## 2020-01-13 NOTE — Progress Notes (Signed)
[place] PHYSICAL MEDICINE & REHABILITATION PROGRESS NOTE   Subjective/Complaints:  Pt reports pain is 3-4/10- hasn't gotten AM meds today- no SOB, breathing well with O2 2L by [place].   Doe shave pain form COVID booster yesterday.  Got bowel program last night- went well- had BM,  Per pt- don't see a note describing this.    ROS:  Pt denies SOB, abd pain, CP, N/V/C/D, and vision changes   Objective:   DG Abd 1 View  Result Date: 01/12/2020 CLINICAL DATA:  Abdominal pain, constipation EXAM: ABDOMEN - 1 VIEW COMPARISON:  None. FINDINGS: Supine frontal view of the abdomen and pelvis excludes the pubic symphysis by collimation. No bowel obstruction or ileus. Significant retained stool throughout the colon consistent with constipation. Multiple surgical clips throughout the upper abdomen. Curvilinear calcification right mid abdomen of uncertain etiology, but could be associated with the right kidney. No acute bony abnormalities. IMPRESSION: 1. Significant fecal retention compatible with constipation. 2. Curvilinear calcification right mid abdomen, of uncertain etiology. This could be related to the right kidney. If further evaluation is desired, ultrasound or CT could be performed. Electronically Signed   By: Randa Ngo M.D.   On: 01/12/2020 17:17   VAS [place] LOWER EXTREMITY VENOUS (DVT)  Result Date: 01/12/2020  Lower Venous DVT Study Other Indications: Immobility. Comparison Study: No previous scan Performing Technologist: Vonzell Schlatter RVT  Examination Guidelines: A complete evaluation includes B-mode imaging, spectral Doppler, color Doppler, and power Doppler as needed of all accessible portions of each vessel. Bilateral testing is considered an integral part of a complete examination. Limited examinations for reoccurring indications may be performed as noted. The reflux portion of the exam is performed with the patient in reverse Trendelenburg.   +---------+---------------+---------+-----------+----------+--------------+ RIGHT    CompressibilityPhasicitySpontaneityPropertiesThrombus Aging +---------+---------------+---------+-----------+----------+--------------+ CFV      Full           Yes      Yes                                 +---------+---------------+---------+-----------+----------+--------------+ SFJ      Full                                                        +---------+---------------+---------+-----------+----------+--------------+ FV Prox  Full                                                        +---------+---------------+---------+-----------+----------+--------------+ FV Mid   Full                                                        +---------+---------------+---------+-----------+----------+--------------+ FV DistalFull                                                        +---------+---------------+---------+-----------+----------+--------------+  PFV      Full                                                        +---------+---------------+---------+-----------+----------+--------------+ POP      Full           Yes      Yes                                 +---------+---------------+---------+-----------+----------+--------------+ PTV      Full                                                        +---------+---------------+---------+-----------+----------+--------------+ PERO     Full                                                        +---------+---------------+---------+-----------+----------+--------------+   +---------+---------------+---------+-----------+----------+--------------+ LEFT     CompressibilityPhasicitySpontaneityPropertiesThrombus Aging +---------+---------------+---------+-----------+----------+--------------+ CFV      Full           Yes      Yes                                  +---------+---------------+---------+-----------+----------+--------------+ SFJ      Full                                                        +---------+---------------+---------+-----------+----------+--------------+ FV Prox  Full                                                        +---------+---------------+---------+-----------+----------+--------------+ FV Mid   Full                                                        +---------+---------------+---------+-----------+----------+--------------+ FV DistalFull                                                        +---------+---------------+---------+-----------+----------+--------------+ PFV      Full                                                        +---------+---------------+---------+-----------+----------+--------------+  POP      Full           Yes      Yes                                 +---------+---------------+---------+-----------+----------+--------------+ PTV      Full                                                        +---------+---------------+---------+-----------+----------+--------------+ PERO     Full                                                        +---------+---------------+---------+-----------+----------+--------------+     Summary: RIGHT: - There is no evidence of deep vein thrombosis in the lower extremity.  - No cystic structure found in the popliteal fossa.  LEFT: - There is no evidence of deep vein thrombosis in the lower extremity.  - No cystic structure found in the popliteal fossa.  *See table(s) above for measurements and observations. Electronically signed by Harold Barban MD on 01/12/2020 at 6:05:45 PM.    Final    Recent Labs    01/12/20 1136 01/13/20 0406  WBC 6.9 8.5  HGB 11.6* 11.7*  HCT 37.0* 36.3*  PLT 266 255   Recent Labs    01/12/20 1136 01/13/20 0406  NA 137 137  K 4.8 5.2*  CL 98 98  CO2 31 30  GLUCOSE 143* 116*  BUN 14 14   CREATININE 1.01 0.96  CALCIUM 10.4* 10.2    Intake/Output Summary (Last 24 hours) at 01/13/2020 0814 Last data filed at 01/13/2020 0500 Gross per 24 hour  Intake 60 ml  Output 600 ml  Net -540 ml        Physical Exam: Vital Signs Blood pressure 95/84, pulse 81, temperature 98.9 F (37.2 C), temperature source Oral, resp. rate 16, height 5\' 9"  (1.753 m), weight 71 kg, SpO2 92 %.  Constitutional:  pt awake, alert, sitting up in bed, NAD, wearing O2 by  2L HENT: lipoma on forehead; conjugate gaze Neck:     Comments: Posterior neck/upper back - incision- a little boggy  At base, but no erythema- little drainage- no significant change Cardiovascular: tachycardic- regular rhythm Pulmonary: decreased at bases, but good air movement on top- no W/R/R Abdominal: Soft, NT, ND, (+)BS  Genitourinary:    Comments: Foley in place- medium amber urine Musculoskeletal:     Comments: UEs 5-/5 in deltoids, biceps, triceps, WE, grip and finger abd B/L LEs- 3+/5 in HF, KE 4/5, DF and PF 4+/5 Skin:dry skin-  Neurological:     Mental Status: He is alert.     Comments: Decreased/absent sensation to light touch from L1-S5 B/L Ox3, but higher level cognition/memory appears impaired  Psychiatric:    appropriate, quiet     Assessment/Plan: 1. Functional deficits which require 3+ hours per day of interdisciplinary therapy in a comprehensive inpatient rehab setting.  Physiatrist is providing close team supervision and 24 hour management of active medical problems listed below.  Physiatrist and rehab team continue to assess barriers to  discharge/monitor patient progress toward functional and medical goals  Care Tool:  Bathing              Bathing assist       Upper Body Dressing/Undressing Upper body dressing   What is the patient wearing?: Hospital gown only    Upper body assist Assist Level: Moderate Assistance - Patient 50 - 74%    Lower Body Dressing/Undressing Lower body  dressing            Lower body assist       Toileting Toileting    Toileting assist Assist for toileting: Maximal Assistance - Patient 25 - 49%     Transfers Chair/bed transfer  Transfers assist           Locomotion Ambulation   Ambulation assist              Walk 10 feet activity   Assist           Walk 50 feet activity   Assist           Walk 150 feet activity   Assist           Walk 10 feet on uneven surface  activity   Assist           Wheelchair     Assist               Wheelchair 50 feet with 2 turns activity    Assist            Wheelchair 150 feet activity     Assist          Blood pressure 95/84, pulse 81, temperature 98.9 F (37.2 C), temperature source Oral, resp. rate 16, height 5\' 9"  (1.753 m), weight 71 kg, SpO2 92 %.  Medical Problem List and Plan: 1.  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  Antithrombotics: -DVT/anticoagulation:  Mechanical: Sequential compression devices, below knee Bilateral lower extremities Will order dopplers due to immobility/tachcyardia. Dopplers (-)- has been 10 days since surgery- will check with [facility] if can start Lovenox asap- high risk  12/22- started SQ heparin per NSU- is ordered             -antiplatelet therapy: N/A 3. Pain Management: will continue Oxycodone prn.  4. Mood: LCSW to follow for evaluation and support.              -antipsychotic agents: N/a 5. Neuropsych: This patient is capable of making decisions on his own behalf. 6. Skin/Wound Care: Routine pressure relief  measures.  7. Fluids/Electrolytes/Nutrition: Monitor I/O. Check electrolytes in am.              --Noted to be malnourished--albumin 2.2. Will add juven to promote healing.  8. 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              --Continue foley for few more days till bowel program set and UTI treated.  9.  Acute renal failure:  Likely due to retention/UTI.             --Labs ordered today and pending             --will check follow up labs in am.  10. Acute blood loss anemia: Will recheck H/H in am.  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              --Plans for simulation on 12/27 with MD appt.   12/22- will see if brother can go with pt to appointment - [facility] at [facility]. 12.  Kleb/Proteus UTI: Started on Cipro Day #1/5 13. Resting tachycardia: Will order EKG for baseline as continues to have intermittent left chest pain (question due to neuropathy). 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 Will   order KUB to evaluate stool burden. On multiple laxative throughout the day--will change senna to 2 tabs in am and at noon. Discontinue Miralax/colace for now till regimen evaluated. Schedule suppository at nights with bowel program  12/22- per pt, had BM las tnight with bowel program  14. 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 Will order orthostatic BP. Will likely need TEDs/binder for support.  15. Insomnia: Will schedule Melatonin at nights.  16. Neurogenic bladder- has foley due to urinary rentention- will start Flomax after supper, since having orthostatic hypotension.  17. Tenting/mild dehydration- will push 6 cups/water daily minimum  12/22- BUN 14- but encourage pt to drink more.      LOS: 1 days A FACE TO FACE EVALUATION WAS PERFORMED  Geo Slone 01/13/2020, 8:12 AM

## 2020-01-13 NOTE — Evaluation (Signed)
Occupational Therapy Assessment and Plan  Patient Details  Name: Gregory Bautista MRN: [id number] Date of Birth: [birth year]  OT Diagnosis: acute pain, muscular wasting and disuse atrophy and muscle weakness (generalized) Rehab Potential: Rehab Potential (ACUTE ONLY): Excellent ELOS: 2.5 - 3 weeks   Today's Date: 01/13/2020 OT Individual Time: 0093-8182 OT Individual Time Calculation (Bautista): 60 Bautista     Hospital Problem: Principal Problem:   Thoracic myelopathy   Past Medical History:  Past Medical History:  Diagnosis Date  . Allergic rhinitis    NEC  . Asthma   . Glaucoma   . 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 Tobacco use disorder 09/24/2019   Past Surgical History:  Past Surgical History:  Procedure Laterality Date  . COLON SURGERY    . EYE SURGERY Right   . 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  Tumor was not felt to be resectable due to advanced disease and spine stabilization recommended with plans for chemo and XRT in the future.  He was evaluated by Dr. JArnoldo Moraleand was admitted on 12/30/2019 for T2-T4 laminectomy for tumor resection with T1-T5 posterior arthrodesis with pedicle screws and rods.  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  Plans are for XRT by Dr. KRanda Ngowith reports  to of simulation set for 12/27. Had been set for follow up with Dr. MEarlie Serveron outpatient basis.    Hospital course has been significant for acute renal failure, overflow incontinence with bladder volumes up to 1250 ml and failed voiding trial therefore foley replaced as well as hypoxia with activity-->remains on supplemental oxygen.  Urine culture was done showing evidence of Proteus and Klebsiella UTI and he was started on Cipro today. He has had ongoing issues with resting tachycardia felt to be due to dehydration. Patient continues to be limited by BLE weakness with instability, left chest pain with burning and has been weaned off oxygen. He continues to have limitations in mobility and ADLS. CIR recommended due to functional decline.     Pt reports hasn't been eating much for the past 1-2 months.  Admits has only been drinking 2 cups of water/day at most. Sounds very dehydrated.  Hasn't been able to control having a BM at all.  Has foley due to urinary retention- being treated for UTI.    .[name] Patient transferred to CIR on 01/12/2020 .    Patient currently requires max with basic self-care skills secondary to muscle weakness, decreased cardiorespiratoy endurance, decreased coordination and decreased sitting balance, decreased standing balance and decreased balance strategies.  Prior to hospitalization, patient could complete ADLs  with mod for the last 2 months, prior to 3 months ago he was mod I.   Patient will benefit from skilled intervention to increase independence with basic self-care skills prior to discharge home with care partner - hopefully - his caregiver is to  be determined.  Anticipate patient will require intermittent supervision and follow up home health.  OT - End of Session Activity Tolerance: Tolerates 10 - 20 Bautista activity with multiple rests Endurance Deficit: Yes OT Assessment Rehab Potential (ACUTE ONLY): Excellent OT Barriers to Discharge: Decreased caregiver support OT  Barriers to Discharge Comments: he lives alone OT Basic ADL's Functional Problem(s): Bathing;Dressing;Toileting OT Advanced ADL's Functional Problem(s): Simple Meal Preparation;Light Housekeeping OT Transfers Functional Problem(s): Toilet;Tub/Shower OT Additional Impairment(s): None OT Plan OT Intensity: Minimum of 1-2 x/day, 45 to 90 minutes OT Frequency: 5 out of 7 days OT Duration/Estimated Length of Stay: 2.5 - 3 weeks OT Treatment/Interventions: Balance/vestibular training;Discharge planning;Disease Lawyer;Functional mobility training;Neuromuscular re-education;Pain management;Psychosocial support;Patient/family education;Self Care/advanced ADL retraining;Therapeutic Activities;Therapeutic Exercise;UE/LE Coordination activities;UE/LE Strength taining/ROM;Skin care/wound managment OT Self Feeding Anticipated Outcome(s): independent OT Basic Self-Care Anticipated Outcome(s): supervision OT Toileting Anticipated Outcome(s): supervision OT Bathroom Transfers Anticipated Outcome(s): supervision OT Recommendation Patient destination: Home Follow Up Recommendations: Home health OT Equipment Recommended: 3 in 1 bedside comode;Tub/shower bench   OT Evaluation Precautions/Restrictions  Precautions Precautions: Back;Fall   Pain Pain Assessment Pain Score: 3  Pain Type: Surgical pain;Chronic pain Pain Location: Neck Pain Descriptors / Indicators: Aching  - also c/o L chest burning session - pt premedicated  Home Living/Prior Functioning Home Living Family/patient expects to be discharged to:: Unsure Living Arrangements:  (unknown at this time) Available Help at Discharge: Available 24 hours/day,Family (brother can stay 2-3 weeks) Type of Home: House Home Access: Stairs to enter [employer] of Steps: 4 Entrance Stairs-Rails: Right,Left Home Layout: One level Bathroom Shower/Tub: Engineer, petroleum:  Standard Bathroom Accessibility: Yes Additional Comments: Patient is concerned that his brother will not be able to provide current level of assist necessary for completion of ADLs.  Lives With: Alone Prior Function Level of Independence: Needs assistance with ADLs  Able to Take Stairs?: Yes (but struggled) Driving: Yes (up until 3 months) Comments: Use of rollator in home and community dwellings. Patient was Mod I with ADLs/IADLs prior to the last few months Vision Baseline Vision/History: Wears glasses Wears Glasses: At all times Patient Visual Report: No change from baseline Vision Assessment?:  (chart reports bluriness, but pt did not feel his vision was blurry with his glasses) Perception  Perception: Within Functional Limits Praxis Praxis: Intact Cognition Overall Cognitive Status: Within Functional Limits for tasks assessed Arousal/Alertness: Awake/alert Orientation Level: Person;Place;Situation Person: Oriented Place: Oriented Situation: Oriented Year:  (2002 "honestly my head feels fuzzy from the medication"!) Month: December Day of Week: Correct Memory: Appears intact Immediate Memory Recall: Sock;Blue;Bed Memory Recall Sock: Without Cue Memory Recall Blue: Without Cue Memory Recall Bed: Without Cue Sensation Sensation Light Touch: Appears Intact Hot/Cold: Appears Intact Proprioception: Appears Intact Stereognosis: Not tested Coordination Gross Motor Movements are Fluid and Coordinated: No Fine Motor Movements are Fluid and Coordinated: Yes Coordination and Movement Description: slow from muscle weakness Motor  Motor Motor - Skilled Clinical Observations: generalized muscle weakness  Trunk/Postural Assessment  Cervical Assessment Cervical Assessment: Within Functional Limits Thoracic Assessment Thoracic Assessment: Within Functional Limits Lumbar Assessment Lumbar Assessment: Within Functional Limits Postural Control Postural Control: Within Functional  Limits (functional in sitting; needs UE support in standing)  Balance Static Sitting Balance Static Sitting - Level of Assistance: 5: Stand by assistance Dynamic Sitting Balance Dynamic Sitting - Level of Assistance: 5: Stand by assistance (limited based on back precautions) Static Standing Balance Static Standing - Level of Assistance: 4: Bautista assist Dynamic Standing Balance Dynamic Standing -  Level of Assistance: 3: Mod assist Extremity/Trunk Assessment RUE Assessment Active Range of Motion (AROM) Comments: WFL General Strength Comments: 4-/5 LUE Assessment Active Range of Motion (AROM) Comments: WFL General Strength Comments: 4-/5  Care Tool Care Tool Self Care Eating    set up    Oral Care    Oral Care Assist Level: Set up assist    Bathing   Body parts bathed by patient: Right arm;Left arm;Chest;Abdomen;Front perineal area;Right upper leg;Left upper leg;Face Body parts bathed by helper: Right lower leg;Left lower leg;Buttocks   Assist Level: Moderate Assistance - Patient 50 - 74%    Upper Body Dressing(including orthotics)   What is the patient wearing?: Pull over shirt   Assist Level: Supervision/Verbal cueing    Lower Body Dressing (excluding footwear)   What is the patient wearing?: Incontinence brief;Pants Assist for lower body dressing: Maximal Assistance - Patient 25 - 49%    Putting on/Taking off footwear   What is the patient wearing?: Non-skid slipper socks Assist for footwear: Maximal Assistance - Patient 25 - 49%       Care Tool Toileting Toileting activity   Assist for toileting: Maximal Assistance - Patient 25 - 49%     Care Tool Bed Mobility Roll left and right activity      Bautista A  Sit to lying activity    Bautista A    Lying to sitting edge of bed activity   Lying to sitting edge of bed assist level: Minimal Assistance - Patient > 75%     Care Tool Transfers Sit to stand transfer   Sit to stand assist level: Moderate Assistance - Patient  50 - 74%    Chair/bed transfer   Chair/bed transfer assist level: Moderate Assistance - Patient 50 - 74%     Toilet transfer   Assist Level: Moderate Assistance - Patient 50 - 74%     Care Tool Cognition Expression of Ideas and Wants Expression of Ideas and Wants: Without difficulty (complex and basic) - expresses complex messages without difficulty and with speech that is clear and easy to understand   Understanding Verbal and Non-Verbal Content Understanding Verbal and Non-Verbal Content: Understands (complex and basic) - clear comprehension without cues or repetitions   Memory/Recall Ability *first 3 days only Memory/Recall Ability *first 3 days only: That he or she is in a hospital/hospital unit;Current season    Refer to Care Plan for Long Term Goals  SHORT TERM GOAL WEEK 1 OT Short Term Goal 1 (Week 1): Pt will be able to stand from EOB to Rw with CGA to prepare for LB self care. OT Short Term Goal 2 (Week 1): Pt will transfer to toilet with Bautista A. OT Short Term Goal 3 (Week 1): Pt will complete toileting with Bautista A. OT Short Term Goal 4 (Week 1): Pt will don pants with Bautista a. OT Short Term Goal 5 (Week 1): Pt will be able to tolerate standing for 5 Bautista with RW with CGA.  Recommendations for other services: Neuropsych   Skilled Therapeutic Intervention ADL ADL Eating: Set up Grooming: Setup Where Assessed-Grooming: Sitting at sink Upper Body Bathing: Supervision/safety Where Assessed-Upper Body Bathing: Sitting at sink Lower Body Bathing: Moderate assistance Where Assessed-Lower Body Bathing: Standing at sink;Sitting at sink Upper Body Dressing: Supervision/safety Where Assessed-Upper Body Dressing: Sitting at sink Lower Body Dressing: Maximal assistance Where Assessed-Lower Body Dressing: Wheelchair Toileting: Maximal assistance Where Assessed-Toileting: Glass blower/designer: Moderate assistance Toilet Transfer Method: Stand pivot Toilet Transfer Equipment:  Raised toilet seat   Pt seen this session for initial evaluation and ADL training with a focus on functional mobility. Pt expressed that he is feeling overwhelmed with his new diagnoses and how he will manage at home.  He is very concerned as he will not have family to assist him as his brother from [place] has been here for over 6 weeks and needs to go home.   Discussed OT role, pt's goals.  Pt participated well and is very motivated but is limited by his reliance on O2 and generalized weakness. He was able to stand from EOB with mod A and pivoted to wc with RW.  Completed self care at the sink level with mod-max A overall.  Pt resting in wc with belt alarm on and all needs met.    Discharge Criteria: Patient will be discharged from OT if patient refuses treatment 3 consecutive times without medical reason, if treatment goals not met, if there is a change in medical status, if patient makes no progress towards goals or if patient is discharged from hospital.  The above assessment, treatment plan, treatment alternatives and goals were discussed and mutually agreed upon: by patient  [facility] 01/13/2020, 12:25 PM

## 2020-01-13 NOTE — Progress Notes (Signed)
Bowel suppository given, awaiting results. Audie Clear, LPN

## 2020-01-13 NOTE — Progress Notes (Signed)
Occupational Therapy Session Note  Patient Details  Name: Gregory Bautista MRN: [id number] Date of Birth: [birth year]  Today's Date: 01/13/2020 OT Individual Time: 1045-1130 OT Individual Time Calculation (min): 45 min  and Today's Date: 01/13/2020 OT Missed Time: 30 Minutes Missed Time Reason: Pain;Patient fatigue   Short Term Goals: Week 1:     Skilled Therapeutic Interventions/Progress Updates:    Pt resting in w/c upon arrival with NT present. Pt c/o neck/back pain/discomfort from sitting unsupported in w/c. Pt states he feels "burning" in back and upper left chest when fatigued. Stand pivot transfer to toilet with mod A. Simulated toileting tasks with max A. Stand pivot transfer to EOB with mod A. Max A for sit<>supine in bed but able to assist with repositioning. Discussed discharge plans. Pt is unsure of discharge plans. Pt lives at home and has no local family. Pt states he is unsure of plan for treatment regarding radiation/chemo. Pt states he was fatigued from earlier therapy session. Pt missed 30 mins skilled OT services 2/2 fatigue.  Therapy Documentation Precautions:  Precautions Precautions: Back,Fall General: General OT Amount of Missed Time: 30 Minutes    Pain:  Pt c/o 5/10 posterior neck pain/discomfort and "burning" when seated unsupported; repositioned   Therapy/Group: Individual Therapy  Leroy Libman 01/13/2020, 11:43 AM

## 2020-01-14 ENCOUNTER — Inpatient Hospital Stay (HOSPITAL_COMMUNITY): Payer: Medicare Other | Admitting: Physical Therapy

## 2020-01-14 ENCOUNTER — Inpatient Hospital Stay (HOSPITAL_COMMUNITY): Payer: Medicare Other

## 2020-01-14 MED ORDER — TRAZODONE HCL 50 MG PO TABS
50.0000 mg | ORAL_TABLET | Freq: Every day | ORAL | Status: DC
  Administered 2020-01-14: 50 mg via ORAL
  Filled 2020-01-14: qty 1

## 2020-01-14 MED ORDER — LIDOCAINE 5 % EX PTCH
1.0000 | MEDICATED_PATCH | Freq: Every day | CUTANEOUS | Status: DC
  Administered 2020-01-14 – 2020-01-20 (×7): 1 via TRANSDERMAL
  Administered 2020-01-21: 3 via TRANSDERMAL
  Filled 2020-01-14 (×8): qty 1

## 2020-01-14 NOTE — Progress Notes (Signed)
Physical Therapy Session Note  Patient Details  Name: Gregory Bautista MRN: [id number] Date of Birth: 08/27/1940  Today's Date: 01/14/2020 PT Individual Time: 0800-0915 PT Individual Time Calculation (min): 75 min   Short Term Goals: Week 1:  PT Short Term Goal 1 (Week 1): Pt will perform least restrictive transfer with min A consistently PT Short Term Goal 2 (Week 1): Pt will ambulate x 50 ft with assist x 1 PT Short Term Goal 3 (Week 1): Pt will initiate stair training  Skilled Therapeutic Interventions/Progress Updates:    pt received in bed and agreeable to therapy. Pt reported 4/10 "burning numbness pain" in Lt chest/shoulder. Pt reported nursing already aware and denied further assistance with nursing at this time. Pt directed in supine>sit min A with HOB slightly elevated. Pt directed in Sit to stand for donning pants over hip, max A with PT assisting in looping pants over feet and pulling over hips. Pt then directed in Stand pivot transfer to [facility] with Rolling walker min A. Pt taken to gym total A for time and energy conservation. Pt directed in x5 Sit to stand to Rolling walker at min A with VC for hand placement and pt required prolonged rest break after rep 3 and rep 5 2/2 fatigue. Pt then directed in standing balance training, denied all symptoms of dizziness, lightheadedness, increased pain or blurred vision; tolerated x6 clothes pins placement activity and required sitting rest break 2/2 fatigue. Pt denied increased pain or other symptoms. Pt required prolonged rest break then agreeable to gait training. Pt directed in gait training with Rolling walker for 50' min A for stability and safety, WC follow for safety VC for trunk extension, head carriage, increased BOS and consistent step length. Pt then reported he needed to have a BM and taken back to room in [facility] total A for time. Pt taken in [place] to restroom, total A for time and line management. Pt then directed in Stand pivot transfer to  toilet with grab bar use min A, mod A for doffing pants and brief. Pt able to have BM and completed hygiene with min A, nursing aware.  Pt directed in Sit to stand for donning pants and brief mod A, Stand pivot transfer to WC at min A with grab bar use. Pt then taken back into room at end of session, total A and requested to return to bed, Stand pivot transfer with Rolling walker min A and sit>supine min A. Pt left with nursing in bed, alarm set, All needs in reach and in good condition. Call light in hand.    Therapy Documentation Precautions:  Precautions Precautions: Back,Fall Precaution Comments: reviewed precautions Restrictions Weight Bearing Restrictions: No General:   Vital Signs:  Pain: Pain Assessment Pain Scale: 0-10 Pain Score: 0-No pain Mobility:   Locomotion :    Trunk/Postural Assessment :    Balance:   Exercises:   Other Treatments:      Therapy/Group: Individual Therapy  [name] 01/14/2020, 12:10 PM

## 2020-01-14 NOTE — Progress Notes (Signed)
Physical Therapy Session Note  Patient Details  Name: Gregory Bautista MRN: [id number] Date of Birth: [birth year]  Today's Date: 01/14/2020 PT Individual Time: 9675-9163 PT Individual Time Calculation (min): 35 min   Short Term Goals: Week 1:  PT Short Term Goal 1 (Week 1): Pt will perform least restrictive transfer with min A consistently PT Short Term Goal 2 (Week 1): Pt will ambulate x 50 ft with assist x 1 PT Short Term Goal 3 (Week 1): Pt will initiate stair training  Skilled Therapeutic Interventions/Progress Updates:    pt received in [facility] with sister and case management present discussing pt's DC planning and current level of assist. Case management left for therapy session to begin. Pt's sister had multiple questions regarding pt's current level of assist, needs for therapy and overall PT POC. Pt present and agreed for PT to share with sister. PT educated pt and his sister on pt's current level of assist varying on min - mod A for transfers and gait based on PT's AM session and chart review. Both reported understanding and sister reports he does not have family to assist him at home, pt reported with PT clarifying multiple times that he would need to be completely I with a Rolling walker to go home, per pt and sister he would not be able to mobilize in home with a WC and pt reports he does not want to go home at [facility] due to this. Both parties report they want to follow any recommendations by therapy and motivated for pt to improve however he is open to other DC locations if he is not I at time of DC. PT also educated pt and sister that it is therapy's goal for pt to continue to improve during admission and typically will determine recommendations of levels of care and equipment closer to DC time. Both agreed and understood. Pt's sister very pleasant and agreeable to conversation, however needed to leave at this time. Pt then reported he was too fatigued post earlier therapy sessions to complete  the rest of this PT session and requested to return to bed. Pt directed in Stand pivot transfer with Rolling walker to bed min A and sit>supine CGA. Pt left in bed with NCT present and All needs in reach and in good condition. Call light in hand.    Therapy Documentation Precautions:  Precautions Precautions: Back,Fall Precaution Comments: reviewed precautions Restrictions Weight Bearing Restrictions: No General: PT Amount of Missed Time (min): 25 Minutes PT Missed Treatment Reason: Patient fatigue Vital Signs: Therapy Vitals Temp: 99.3 F (37.4 C) Pulse Rate: (!) 110 Resp: 20 BP: 113/64 Patient Position (if appropriate): Lying Oxygen Therapy SpO2: 94 % O2 Device: Nasal Cannula Pain:   Mobility:   Locomotion :    Trunk/Postural Assessment :    Balance:   Exercises:   Other Treatments:      Therapy/Group: Individual Therapy  [name] 01/14/2020, 2:57 PM

## 2020-01-14 NOTE — Progress Notes (Signed)
Orthopedic Tech Progress Note Patient Details:  Gregory Bautista 05-12-1940 [id number] Called in resting hand splint Ortho Devices Type of Ortho Device: Prafo boot/shoe Ortho Device/Splint Interventions: Ordered   Post Interventions Patient Tolerated: Other (comment) Instructions Provided: Care of device,Adjustment of device,Poper ambulation with device   [name] 01/14/2020, 11:46 AM

## 2020-01-14 NOTE — Progress Notes (Signed)
Pt noted to have elevated HR in 110s taken during vitals, no other s/sx noted. MD Lovorn updated w/ patient status. No new orders noted.

## 2020-01-14 NOTE — Progress Notes (Addendum)
[facility] PHYSICAL MEDICINE & REHABILITATION PROGRESS NOTE   Subjective/Complaints:  Pt reports doesn't "feel normal" for months.  Esp not sleeping well at all-  Ate 90% of breakfast.   Pain still 3-4/10- not taking pain meds all the time.   ROS:  Pt denies SOB, abd pain, CP, N/V/C/D, and vision changes   Objective:   DG Abd 1 View  Result Date: 01/12/2020 CLINICAL DATA:  Abdominal pain, constipation EXAM: ABDOMEN - 1 VIEW COMPARISON:  None. FINDINGS: Supine frontal view of the abdomen and pelvis excludes the pubic symphysis by collimation. No bowel obstruction or ileus. Significant retained stool throughout the colon consistent with constipation. Multiple surgical clips throughout the upper abdomen. Curvilinear calcification right mid abdomen of uncertain etiology, but could be associated with the right kidney. No acute bony abnormalities. IMPRESSION: 1. Significant fecal retention compatible with constipation. 2. Curvilinear calcification right mid abdomen, of uncertain etiology. This could be related to the right kidney. If further evaluation is desired, ultrasound or CT could be performed. Electronically Signed   By: Randa Ngo M.D.   On: 01/12/2020 17:17   VAS [place] LOWER EXTREMITY VENOUS (DVT)  Result Date: 01/12/2020  Lower Venous DVT Study Other Indications: Immobility. Comparison Study: No previous scan Performing Technologist: Vonzell Schlatter RVT  Examination Guidelines: A complete evaluation includes B-mode imaging, spectral Doppler, color Doppler, and power Doppler as needed of all accessible portions of each vessel. Bilateral testing is considered an integral part of a complete examination. Limited examinations for reoccurring indications may be performed as noted. The reflux portion of the exam is performed with the patient in reverse Trendelenburg.  +---------+---------------+---------+-----------+----------+--------------+ RIGHT     CompressibilityPhasicitySpontaneityPropertiesThrombus Aging +---------+---------------+---------+-----------+----------+--------------+ CFV      Full           Yes      Yes                                 +---------+---------------+---------+-----------+----------+--------------+ SFJ      Full                                                        +---------+---------------+---------+-----------+----------+--------------+ FV Prox  Full                                                        +---------+---------------+---------+-----------+----------+--------------+ FV Mid   Full                                                        +---------+---------------+---------+-----------+----------+--------------+ FV DistalFull                                                        +---------+---------------+---------+-----------+----------+--------------+ PFV      Full                                                        +---------+---------------+---------+-----------+----------+--------------+  POP      Full           Yes      Yes                                 +---------+---------------+---------+-----------+----------+--------------+ PTV      Full                                                        +---------+---------------+---------+-----------+----------+--------------+ PERO     Full                                                        +---------+---------------+---------+-----------+----------+--------------+   +---------+---------------+---------+-----------+----------+--------------+ LEFT     CompressibilityPhasicitySpontaneityPropertiesThrombus Aging +---------+---------------+---------+-----------+----------+--------------+ CFV      Full           Yes      Yes                                 +---------+---------------+---------+-----------+----------+--------------+ SFJ      Full                                                         +---------+---------------+---------+-----------+----------+--------------+ FV Prox  Full                                                        +---------+---------------+---------+-----------+----------+--------------+ FV Mid   Full                                                        +---------+---------------+---------+-----------+----------+--------------+ FV DistalFull                                                        +---------+---------------+---------+-----------+----------+--------------+ PFV      Full                                                        +---------+---------------+---------+-----------+----------+--------------+ POP      Full           Yes      Yes                                 +---------+---------------+---------+-----------+----------+--------------+  PTV      Full                                                        +---------+---------------+---------+-----------+----------+--------------+ PERO     Full                                                        +---------+---------------+---------+-----------+----------+--------------+     Summary: RIGHT: - There is no evidence of deep vein thrombosis in the lower extremity.  - No cystic structure found in the popliteal fossa.  LEFT: - There is no evidence of deep vein thrombosis in the lower extremity.  - No cystic structure found in the popliteal fossa.  *See table(s) above for measurements and observations. Electronically signed by Harold Barban MD on 01/12/2020 at 6:05:45 PM.    Final    Recent Labs    01/12/20 1136 01/13/20 0406  WBC 6.9 8.5  HGB 11.6* 11.7*  HCT 37.0* 36.3*  PLT 266 255   Recent Labs    01/12/20 1136 01/13/20 0406  NA 137 137  K 4.8 5.2*  CL 98 98  CO2 31 30  GLUCOSE 143* 116*  BUN 14 14  CREATININE 1.01 0.96  CALCIUM 10.4* 10.2    Intake/Output Summary (Last 24 hours) at 01/14/2020 1001 Last data filed at  01/14/2020 0700 Gross per 24 hour  Intake 600 ml  Output 1200 ml  Net -600 ml        Physical Exam: Vital Signs Blood pressure (!) 154/98, pulse 92, temperature 99 F (37.2 C), resp. rate 17, height 5\' 9"  (1.753 m), weight 71 kg, SpO2 95 %.  Constitutional:  pt awake, appropriate, sitting up in bed- ate >90% of breakfast, NAD HENT: lipoma on forehead; conjugate gaze; O2 by Luana 2L Neck:     Comments: Posterior neck/upper back - incision- a little boggy  At base, but no erythema- little drainage- no change Cardiovascular: RRR Pulmonary: CTA B/L- no W/R/R- good air movement except decreased at bases Abdominal: Soft, NT, ND, (+)BS   Genitourinary:    Comments: Foley in place- medium amber urine still Musculoskeletal:     Comments: UEs 5-/5 in deltoids, biceps, triceps, WE, grip and finger abd B/L LEs- 3+/5 in HF, KE 4/5, DF and PF 4+/5 Skin:dry skin-  Neurological:     Mental Status: He is alert.     Comments: Decreased/absent sensation to light touch from L1-S5 B/L Ox3, but higher level cognition/memory appears impaired  Psychiatric:    appropriate, quiet     Assessment/Plan: 1. Functional deficits which require 3+ hours per day of interdisciplinary therapy in a comprehensive inpatient rehab setting.  Physiatrist is providing close team supervision and 24 hour management of active medical problems listed below.  Physiatrist and rehab team continue to assess barriers to discharge/monitor patient progress toward functional and medical goals  Care Tool:  Bathing    Body parts bathed by patient: Right arm,Left arm,Chest,Abdomen,Front perineal area,Right upper leg,Left upper leg,Face   Body parts bathed by helper: Right lower leg,Left lower leg,Buttocks     Bathing assist Assist Level: Moderate Assistance - Patient 50 - 74%  Upper Body Dressing/Undressing Upper body dressing   What is the patient wearing?: Pull over shirt    Upper body assist Assist Level:  Supervision/Verbal cueing    Lower Body Dressing/Undressing Lower body dressing      What is the patient wearing?: Incontinence brief     Lower body assist Assist for lower body dressing: 2 Helpers     Toileting Toileting    Toileting assist Assist for toileting: 2 Helpers     Transfers Chair/bed transfer  Transfers assist     Chair/bed transfer assist level: Moderate Assistance - Patient 50 - 74%     Locomotion Ambulation   Ambulation assist      Assist level: 2 helpers Assistive device: Walker-rolling Max distance: 30'   Walk 10 feet activity   Assist     Assist level: 2 helpers Assistive device: Walker-rolling   Walk 50 feet activity   Assist Walk 50 feet with 2 turns activity did not occur: Safety/medical concerns         Walk 150 feet activity   Assist Walk 150 feet activity did not occur: Safety/medical concerns         Walk 10 feet on uneven surface  activity   Assist Walk 10 feet on uneven surfaces activity did not occur: Safety/medical concerns         Wheelchair     Assist Will patient use wheelchair at discharge?:  (TBD) Type of Wheelchair: Manual    Wheelchair assist level: Supervision/Verbal cueing Max wheelchair distance: 100'    Wheelchair 50 feet with 2 turns activity    Assist        Assist Level: Supervision/Verbal cueing   Wheelchair 150 feet activity     Assist      Assist Level: Moderate Assistance - Patient 50 - 74%   Blood pressure (!) 154/98, pulse 92, temperature 99 F (37.2 C), resp. rate 17, height 5\' 9"  (1.753 m), weight 71 kg, SpO2 95 %.  Medical Problem List and Plan: 1.  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  Antithrombotics: -DVT/anticoagulation:  Mechanical: Sequential compression devices, below knee Bilateral lower extremities Will order dopplers due to immobility/tachcyardia. Dopplers (-)- has been 10 days since surgery- will check with NSU if can start Lovenox asap- high risk  12/22- started SQ heparin per NSU- is ordered             -antiplatelet therapy: N/A 3. Pain Management: will continue Oxycodone prn.  4. Mood: LCSW to follow for evaluation and support.              -antipsychotic agents: N/a 5. Neuropsych: This patient is capable of making decisions on his own behalf. 6. Skin/Wound Care: Routine pressure relief measures.  7. Fluids/Electrolytes/Nutrition: Monitor I/O. Check electrolytes in am.              --  Noted to be malnourished--albumin 2.2. Will add juven to promote healing.  8. 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              --Continue foley for few more days till bowel program set and UTI treated.   12/23- discussed with pt/brother- will wait til next week, to not add more burden to pt with all these other issues.  9.  Acute renal failure: Likely due to retention/UTI.             --Labs ordered today and pending             --will check follow up labs in am.  10. Acute blood loss anemia: Will recheck H/H in am.  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              --Plans for simulation on 12/27 with MD appt.   12/22- will see if brother can go with pt to appointment - likely at [place] [facility].  12/23- brother will go-  12.  Kleb/Proteus UTI: Started on Cipro Day #1/5 13. Resting tachycardia: Will order EKG for baseline as continues to have intermittent  left chest pain (question due to neuropathy). 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 Will   order KUB to evaluate stool burden. On multiple laxative throughout the day--will change senna to 2 tabs in am and at noon. Discontinue Miralax/colace for now till regimen evaluated. Schedule suppository at nights with bowel program  12/22- per pt, had BM las tnight with bowel program  14. 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 Will order orthostatic BP. Will likely need TEDs/binder for support.  15. Insomnia: Will schedule Melatonin at nights.  16. Neurogenic bladder- has foley due to urinary rentention- will start Flomax after supper, since having orthostatic hypotension.   12/23- wait on removing foley- con't flomax 17. Tenting/mild dehydration- will push 6 cups/water daily minimum  12/22- BUN 14- but encourage pt to drink more.  18. Hyperkalemia  12/23- gave Lokelma- will recheck in AM 19. Insomnia  12/23- added trazodone 50 mg QHS   Spent >35 minutes on total care->50% on coordination of care as below-  called Brother Gregory Bautista and spoke to him for 25 minutes about medical dx, Oncology appointment Monday, Neurogenic bowel and bladder, foley, and cognitive issues  LOS: 2 days A FACE TO FACE EVALUATION WAS PERFORMED  Gregory Bautista 01/14/2020, 10:01 AM

## 2020-01-14 NOTE — Progress Notes (Signed)
Occupational Therapy Session Note  Patient Details  Name: Gregory Bautista MRN: [id number] Date of Birth: 10/06/1940  Today's Date: 01/14/2020 OT Individual Time: 1000-1100 OT Individual Time Calculation (min): 60 min    Short Term Goals: Week 1:  OT Short Term Goal 1 (Week 1): Pt will be able to stand from EOB to Rw with CGA to prepare for LB self care. OT Short Term Goal 2 (Week 1): Pt will transfer to toilet with min A. OT Short Term Goal 3 (Week 1): Pt will complete toileting with min A. OT Short Term Goal 4 (Week 1): Pt will don pants with min a. OT Short Term Goal 5 (Week 1): Pt will be able to tolerate standing for 5 min with RW with CGA.  Skilled Therapeutic Interventions/Progress Updates:    Pt asleep upon arrival but agreeable to getting OOB and shaving and washing up. Supine>sit EOB with supervision. Squat pivot transfer with mod A. Pt initially attempted to shave with his clippers but was not successful and stated he would wait until his barber came up. Pt was UB seated in w/c. Pt required rest breaks X 3 during bathing. Pt stated he needed to use toilet. Stand pivot transfer to [facility] over toilet with mod A. Tot A for clothing management. Pt remained on toilet with NT Gracie present.   Therapy Documentation Precautions:  Precautions Precautions: Back,Fall Precaution Comments: reviewed precautions Restrictions Weight Bearing Restrictions: No  Pain: Pain Assessment Pain Scale: 0-10 Pain Score: 0-No pain but c/o "burning" in upper thoracic region when sitting unsupported   Therapy/Group: Individual Therapy  Leroy Libman 01/14/2020, 12:00 PM

## 2020-01-14 NOTE — Progress Notes (Signed)
Patient ID: Gregory Bautista, male   DOB: 04-15-1940, 79 y.o.   MRN: [id number]  SW called pt dtr [name] [phone]) to provide updates on pt ELOS 18-21 days,and discussed discharge plan. Reports that discharge plan remains undetermined at this time. SW encouraged family to discuss amount of support that can be provided to pt at discharge. She reported concerns related to an upcoming meeting today. SW informed not aware of a meeting today, but there is a meeting on Monday with oncology. Sw informed there will be updates after team conference,and a d/c date.   SW called [facility] [phone]) to inform on pt ELOS due to an upcoming PET scan. SW inquired if this would be covered if performed while in hospital. Reports it would not be covered, and has to be done as an outpatient procedure. SW informed will call back after team conference to schedule appointment for PET scan as we will have a discharge date.   SW met with pt and pt sister Judson Roch in room to discuss d/c plan. Pt admits he lives alone, and has a brother that lives locally, but he is not able to assist with any of his care needs. His sister Judson Roch is visiting from [facility], and his brother Elveria Royals has been here from [place] to assist him temporarily. Pt encouraged to focus on his upcoming appointment with oncology in efforts to make next best step with regard to his plan of care. SW did discuss d/c to home with [facility] and hiring private aide, as well as, SNF/short term rehab. SW indicated will discuss further after team conference on Tuesday.  If pt were to require SNF placement, he would not be considered SNF eligible per nursing home guidelines treatment will need to be completed first.   [name], MSW, [place] Office: [phone] Cell: [phone] Fax: [phone]

## 2020-01-14 NOTE — Progress Notes (Signed)
Bowel program not completed, due to suppository refusal per dayshift RN. Pt reeducated on purpose and importance of bowel program, pt verbalized that the prior nurse had said the same. As of right now, pt continues to refuse. States "maybe tomorrow, not right now".

## 2020-01-14 NOTE — Progress Notes (Signed)
Ortho v/s partially complete, unable to obtain standing vitals as pt unable to tolerate.

## 2020-01-14 NOTE — Care Management (Signed)
[facility] Individual Statement of Services  Patient Name:  Gregory Bautista  Date:  01/14/2020  Welcome to the [facility].  Our goal is to provide you with an individualized program based on your diagnosis and situation, designed to meet your specific needs.  With this comprehensive rehabilitation program, you will be expected to participate in at least 3 hours of rehabilitation therapies Monday-Friday, with modified therapy programming on the weekends.  Your rehabilitation program will include the following services:  Physical Therapy (PT), Occupational Therapy (OT), 24 hour per day rehabilitation nursing, Therapeutic Recreaction (TR), Neuropsychology, Care Coordinator, Rehabilitation Medicine, Nutrition Services, Pharmacy Services and Other  Weekly team conferences will be held on Tuesdays to discuss your progress.  Your Inpatient Rehabilitation Care Coordinator will talk with you frequently to get your input and to update you on team discussions.  Team conferences with you and your family in attendance may also be held.  Expected length of stay: 18-21 days    Overall anticipated outcome: Supervision  Depending on your progress and recovery, your program may change. Your Inpatient Rehabilitation Care Coordinator will coordinate services and will keep you informed of any changes. Your Inpatient Rehabilitation Care Coordinator's name and contact numbers are listed  below.  The following services may also be recommended but are not provided by the [facility] will be made to provide these services after discharge if needed.  Arrangements include referral to agencies that provide these services.  Your insurance has been verified to be:  [facility] Medicare  Your primary doctor is:  Alma Friendly  Pertinent information will be shared with your doctor and your insurance company.  Inpatient Rehabilitation Care Coordinator:  Cathleen Corti [phone] or [phone]  Information discussed with and copy given to patient by: Rana Snare, 01/14/2020, 8:33 AM

## 2020-01-15 ENCOUNTER — Inpatient Hospital Stay (HOSPITAL_COMMUNITY): Payer: Medicare Other | Admitting: Occupational Therapy

## 2020-01-15 ENCOUNTER — Inpatient Hospital Stay (HOSPITAL_COMMUNITY): Payer: Medicare Other | Admitting: Physical Therapy

## 2020-01-15 LAB — BASIC METABOLIC PANEL
Anion gap: 8 (ref 5–15)
BUN: 24 mg/dL — ABNORMAL HIGH (ref 8–23)
CO2: 30 mmol/L (ref 22–32)
Calcium: 10.4 mg/dL — ABNORMAL HIGH (ref 8.9–10.3)
Chloride: 97 mmol/L — ABNORMAL LOW (ref 98–111)
Creatinine, Ser: 0.88 mg/dL (ref 0.61–1.24)
GFR, Estimated: >60 mL/min (ref >60)
Glucose, Bld: 108 mg/dL — ABNORMAL HIGH (ref 70–99)
Potassium: 4.4 mmol/L (ref 3.5–5.1)
Sodium: 135 mmol/L (ref 135–145)

## 2020-01-15 MED ORDER — TRAZODONE HCL 50 MG PO TABS
100.0000 mg | ORAL_TABLET | Freq: Every day | ORAL | Status: DC
  Administered 2020-01-15 – 2020-02-11 (×22): 100 mg via ORAL
  Filled 2020-01-15 (×23): qty 2

## 2020-01-15 NOTE — Progress Notes (Signed)
Occupational Therapy Session Note  Patient Details  Name: Gregory Bautista MRN: [id number] Date of Birth: Oct 08, 1940  Today's Date: 01/15/2020 OT Individual Time: 0930-1030 OT Individual Time Calculation (min): 60 min    Short Term Goals: Week 1:  OT Short Term Goal 1 (Week 1): Pt will be able to stand from EOB to Rw with CGA to prepare for LB self care. OT Short Term Goal 2 (Week 1): Pt will transfer to toilet with min A. OT Short Term Goal 3 (Week 1): Pt will complete toileting with min A. OT Short Term Goal 4 (Week 1): Pt will don pants with min a. OT Short Term Goal 5 (Week 1): Pt will be able to tolerate standing for 5 min with RW with CGA.  Skilled Therapeutic Interventions/Progress Updates:      Pt seen for BADL retraining of toileting, bathing, and dressing with a focus on functional mobility. Pt had 2L of O2 on. Pt able to sit to EOB with S. Sit to stand from EOB with RW with min A. Pt ambulated 12 feet to toilet and was able to have a bowel movement on the toilet. He was able to partially cleanse himself with TP and then therapist cleansed further with washcloths. In standing, pt cleansed his front perineal area.  Finished washing and dressing LB from toilet with mod A overall.  He then ambulated to wc in room (another 12 feet) with min A with Rw.  Pt set up at sink and completed UB self care.  NT in room with pt setting him up with belt alarm and call light.   Great participation today.   Therapy Documentation Precautions:  Precautions Precautions: Back,Fall Precaution Comments: reviewed precautions Restrictions Weight Bearing Restrictions: No      Pain: Pain Assessment Pain Score: 0-No pain ADL: ADL Eating: Set up Grooming: Setup Where Assessed-Grooming: Sitting at sink Upper Body Bathing: Supervision/safety Where Assessed-Upper Body Bathing: Sitting at sink Lower Body Bathing: Moderate assistance Where Assessed-Lower Body Bathing: Standing at sink,Sitting at  sink Upper Body Dressing: Supervision/safety Where Assessed-Upper Body Dressing: Sitting at sink Lower Body Dressing: Moderate assistance Where Assessed-Lower Body Dressing: Wheelchair Toileting: Moderate assistance Where Assessed-Toileting: Glass blower/designer: Minimal Print production planner Method: Stand Ecologist: Raised toilet seat   Therapy/Group: Individual Therapy  Martin 01/15/2020, 12:14 PM

## 2020-01-15 NOTE — Progress Notes (Signed)
Physical Therapy Session Note  Patient Details  Name: Gregory Bautista MRN: [id number] Date of Birth: 1940/09/10  Today's Date: 01/15/2020 PT Individual Time: 1100-1145 PT Individual Time Calculation (min): 45 min   Short Term Goals: Week 1:  PT Short Term Goal 1 (Week 1): Pt will perform least restrictive transfer with min A consistently PT Short Term Goal 2 (Week 1): Pt will ambulate x 50 ft with assist x 1 PT Short Term Goal 3 (Week 1): Pt will initiate stair training  Skilled Therapeutic Interventions/Progress Updates:    pt received in [facility] and agreeable to therapy. Pt directed in 2x Sit to stand to Rolling walker for improved positioning in [facility] and pt agreeable to be taken to gym for Christmas activities, requesting to hear the music. Pt directed in [place] mobility initially with VC and education on WC parts and technique, mod A overall for 50'. PT then completed rest of distance. Pt requested to listen to Christmas activity music to promote increased social interaction, improve mood, promote stress relief and pt directed to stand and move rhythmically tolerating 2 mins, 1 min atCGA at Rolling walker. Pt then directed in rhythmically move BLE to beat of music 3 mins, 25mins, 3 mins. Pt's family then entered to visit family and pt requested to return to room. Pt taken total A to room for time and energy. Pt requested to remain in [facility] with family present, All needs in reach and in good condition. Call light in hand.  Pt missed 15 mins of session and verbalized understanding.   Therapy Documentation Precautions:  Precautions Precautions: Back,Fall Precaution Comments: reviewed precautions Restrictions Weight Bearing Restrictions: No General: PT Amount of Missed Time (min): 15 Minutes PT Missed Treatment Reason: Patient fatigue;Other (Comment) (and family present) Vital Signs: Therapy Vitals Temp: 98.1 F (36.7 C) Pulse Rate: (!) 108 Resp: 18 BP: (!) 152/123 Patient Position (if  appropriate): Sitting Oxygen Therapy SpO2: 100 % O2 Device: Nasal Cannula O2 Flow Rate (L/min): 2 L/min Pain: Pain Assessment Pain Score: 0-No pain Mobility:   Locomotion :    Trunk/Postural Assessment :    Balance:   Exercises:   Other Treatments:      Therapy/Group: Individual Therapy  [name] 01/15/2020, 2:04 PM

## 2020-01-15 NOTE — Progress Notes (Signed)
Physical Therapy Session Note  Patient Details  Name: Gregory Bautista MRN: [id number] Date of Birth: October 29, 1940  Today's Date: 01/15/2020 PT Individual Time: 1515-1630 PT Individual Time Calculation (min): 75 min   Short Term Goals: Week 1:  PT Short Term Goal 1 (Week 1): Pt will perform least restrictive transfer with min A consistently PT Short Term Goal 2 (Week 1): Pt will ambulate x 50 ft with assist x 1 PT Short Term Goal 3 (Week 1): Pt will initiate stair training  Skilled Therapeutic Interventions/Progress Updates:    Pt received supine in bed, agreeable to PT session. No complaints of pain at rest, reports pain in mid-upper back region with mobility and reports he can feel the rod that was placed in his back. Pt also noted to have forward flexed head in sitting position. Reviewed scapular mobility and had pt perform scap squeezes to assist with posture and muscular tightness. Pt will require ongoing education regarding this. Supine to sitting EOB with CGA needed for trunk control and cues for log roll technique. Sit to stand and stand pivot transfer with RW at min A level this date. Ambulation 2 x 60 ft with RW and min A for balance with close w/c follow from a 2nd person for safety. With onset of fatigue pt exhibits increase in ataxia and LE scissoring. Ascend/descend 1 x 3" step with 2 handrails and mod A for balance. Standing alt L/R 3" step-ups with 2 handrails and mod A, 2 x 10 reps to fatigue. Pt exhibits decreased knee control in LLE>RLE. Standing alt L/R cone taps with RW and min A for balance for LE coordination training. Pt returned to bed at end of session, min A for LLE management. Pt on 2L O2 throughout session, SpO2 remains at 98% and higher with activity. Pt left semi-reclined in bed with needs in reach, bed alarm in place at end of session.  Therapy Documentation Precautions:  Precautions Precautions: Back,Fall Precaution Comments: reviewed  precautions Restrictions Weight Bearing Restrictions: No   Therapy/Group: Individual Therapy   Excell Seltzer, PT, DPT  01/15/2020, 4:52 PM

## 2020-01-15 NOTE — Progress Notes (Signed)
Inpatient Rehabilitation Care Coordinator Assessment and Plan Patient Details  Name: Janis Cuffe MRN: [id number] Date of Birth: 12-23-1940  Today's Date: 01/15/2020  Hospital Problems: Principal Problem:   Thoracic myelopathy  Past Medical History:  Past Medical History:  Diagnosis Date  . Allergic rhinitis    NEC  . Asthma   . Glaucoma   . 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 Tobacco use disorder 09/24/2019   Past Surgical History:  Past Surgical History:  Procedure Laterality Date  . COLON SURGERY    . EYE SURGERY Right   . LAMINECTOMY WITH POSTERIOR LATERAL ARTHRODESIS LEVEL 4 N/A 12/30/2019   Procedure: THORACIC THREE LAMINECTOMY, THORACIC INSTRUMENTATION AND FUSION THORACIC ONE-FIVE;  Surgeon: Newman Pies, MD;  Location: [facility];  Service: Neurosurgery;  Laterality: N/A;   Social History:  reports that he quit smoking about 3 months ago. His smoking use included cigarettes. He started smoking about 64 years ago. He has a 30.00 pack-year smoking history. He has never used smokeless tobacco. He reports that he does not drink alcohol and does not use drugs.  Family / Support Systems Marital Status: Widow/Widower How Long?: 6 years Patient Roles: Parent Spouse/Significant Other: Widowed Children: 4 adult children. All children live outside of [place]. Other Supports: older brother who lives locally can only provide intermittent check-ins/supervision Anticipated Caregiver: None Ability/Limitations of Caregiver: Pt does not have any family that lives locally. Caregiver Availability: Other (Comment) (TBD) Family Dynamics: Pt lives alone.  Social History Preferred language: English Religion: Holiness Cultural Background: Pt worked as a Aeronautical engineer for 40 years Education: 7th grade Read: Yes Write: Yes Employment Status: Retired Date Retired/Disabled/Unemployed: retired in 2009 Public relations account executive Issues: Denies Guardian/Conservator: N/A    Abuse/Neglect Abuse/Neglect Assessment Can Be Completed: Yes Physical Abuse: Denies Verbal Abuse: Denies Sexual Abuse: Denies Exploitation of patient/patient's resources: Denies Self-Neglect: Denies  Emotional Status Pt's affect, behavior and adjustment status: Pt has concerns about overall health Recent Psychosocial Issues: None reported; situational depression Psychiatric History: Denies Substance Abuse History: Denies; quit smoking 4 months ago  Patient / Family Perceptions, Expectations & Goals Pt/Family understanding of illness & functional limitations: Pt and family have a general understanding of care needs Premorbid pt/family roles/activities: Independent Anticipated changes in roles/activities/participation: Assistance with ADLs/IADLs Pt/family expectations/goals: Pt goal is to regain "balance." He believes if he can improve balance, he can do better.  Community Resources Express Scripts: None Premorbid Home Care/DME Agencies: None Transportation available at discharge: TBD Resource referrals recommended: Neuropsychology  Discharge Planning Living Arrangements: Alone Support Systems: Other relatives,Children Type of Residence: Private residence Insurance Resources: Multimedia programmer (specify) ([employer] Medicare) Financial Resources: Radio broadcast assistant Screen Referred: No Living Expenses: Own Money Management: Patient Does the patient have any problems obtaining your medications?: No Care Coordinator Barriers to Discharge: Decreased caregiver support,Lack of/limited family support Care Coordinator Anticipated Follow Up Needs: HH/OP Expected length of stay: 18-21 days  Clinical Impression D/c plan pending care needs at d/c as pt has upcoming appt with oncology on 12/27 to determine best plan of care. Pt primary contact to discuss d/c is his dtr [name] [phone]). 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 DME: cane.   Karmen Altamirano A Rye Dorado 01/15/2020, 10:45 PM

## 2020-01-15 NOTE — Progress Notes (Signed)
Patient had an incontinent moderate amount of stool. RN offered dig stimulation and patient was agreeable. Got small amount of mushy, brown stool. Peri care done.

## 2020-01-15 NOTE — IPOC Note (Signed)
Overall Plan of Care [facility]) Patient Details Name: Gregory Bautista MRN: [id number] DOB: 1940-06-26  Admitting Diagnosis: Thoracic myelopathy  Hospital Problems: Principal Problem:   Thoracic myelopathy     Functional Problem List: Nursing Skin Integrity,Motor,Bladder,Medication Management,Endurance,Pain  PT Balance,Endurance,Motor,Pain,Safety,Sensory  OT    SLP    TR         Basic ADL's: OT Bathing,Dressing,Toileting     Advanced  ADL's: OT Simple Meal Preparation,Light Housekeeping     Transfers: PT Bed Mobility,Bed to [place]  OT Toilet,Tub/Shower     Locomotion: PT Ambulation,Wheelchair Mobility,Stairs     Additional Impairments: OT None  SLP        TR      Anticipated Outcomes Item Anticipated Outcome  Self Feeding independent  Swallowing      Basic self-care  supervision  Toileting  supervision   Bathroom Transfers supervision  Bowel/Bladder  Pt will manage bowel and bladder with min assist  Transfers  Supervision  Locomotion  Supervision with LRAD  Communication     Cognition     Pain  Pt will manage pain at 3 or less on a scale of 0-10.  Safety/Judgment  Pt will remain free of falls with injury while in rehab with min assist   Therapy Plan: PT Intensity: Minimum of 1-2 x/day ,45 to 90 minutes PT Frequency: 5 out of 7 days PT Duration Estimated Length of Stay: 18-21 days OT Intensity: Minimum of 1-2 x/day, 45 to 90 minutes OT Frequency: 5 out of 7 days OT Duration/Estimated Length of Stay: 2.5 - 3 weeks     Due to the current state of emergency, patients may not be receiving their 3-hours of Medicare-mandated therapy.   Team Interventions: Nursing Interventions Pain Management,Patient/Family Education,Bladder Management,Bowel Management,Medication Management,Discharge Planning,Skin Care/Wound Management  PT interventions Ambulation/gait training,Balance/vestibular training,Community reintegration,Discharge  planning,DME/adaptive equipment instruction,Functional mobility training,Neuromuscular re-education,Pain management,Patient/family education,Psychosocial support,Stair training,Therapeutic Activities,Therapeutic Exercise,UE/LE Strength taining/ROM,UE/LE Coordination activities,Wheelchair propulsion/positioning  OT Interventions Balance/vestibular training,Discharge planning,Disease mangement/prevention,DME/adaptive equipment instruction,Functional mobility training,Neuromuscular re-education,Pain management,Psychosocial support,Patient/family education,Self Care/advanced ADL retraining,Therapeutic Activities,Therapeutic Exercise,UE/LE Coordination activities,UE/LE Strength taining/ROM,Skin care/wound managment  SLP Interventions    TR Interventions    SW/CM Interventions     Barriers to Discharge MD  Medical stability, Home enviroment access/loayout, Incontinence, Neurogenic bowel and bladder, Wound care, Lack of/limited family support, Weight, Weight bearing restrictions, Medication compliance, Pending chemo/radiation and New oxygen  Nursing      PT Decreased caregiver support,Home environment access/layout,Incontinence,Neurogenic Bowel & Bladder,Lack of/limited family support,Pending chemo/radiation,New oxygen    OT Decreased caregiver support he lives alone  SLP      SW       Team Discharge Planning: Destination: PT-Home ,OT- Home , SLP-  Projected Follow-up: PT-Home health PT, OT-  Home health OT, SLP-  Projected Equipment Needs: PT-Rolling walker with 5" wheels,To be determined, OT- 3 in 1 bedside comode,Tub/shower bench, SLP-  Equipment Details: PT-further TBD pending progress, OT-  Patient/family involved in discharge planning: PT- Patient,  OT-Patient, SLP-   MD ELOS: 18-21 days Medical Rehab Prognosis:  Good Assessment: Pt is a 79 yr old male with Pancoast's tumor with T3 mets- s/p resection and T1-T5 posterior fusion with neurogenic bowel and bladder, dehydration, new O2  requirement, and need for chemo/radiation- going to [facility] on Monday for Oncology.  Also stilll has Foley due to being overwhelmed/urinary retention- will discuss removing next Wednesday- to get on board with bowel program first.  " Goals Supervision in 18-21 days. Might need ot leave earlier for cancer tx.  See Team Conference Notes for weekly updates to the plan of care

## 2020-01-15 NOTE — Progress Notes (Signed)
[place] PHYSICAL MEDICINE & REHABILITATION PROGRESS NOTE   Subjective/Complaints:  Slept slightly better, but not great.  Per nurse, has refused bowel program last 2 nights- only having smears.     ROS:  Pt denies SOB, abd pain, CP, N/V/C/D, and vision changes  Objective:   No results found. Recent Labs    01/12/20 1136 01/13/20 0406  WBC 6.9 8.5  HGB 11.6* 11.7*  HCT 37.0* 36.3*  PLT 266 255   Recent Labs    01/13/20 0406 01/15/20 0428  NA 137 135  K 5.2* 4.4  CL 98 97*  CO2 30 30  GLUCOSE 116* 108*  BUN 14 24*  CREATININE 0.96 0.88  CALCIUM 10.2 10.4*    Intake/Output Summary (Last 24 hours) at 01/15/2020 0858 Last data filed at 01/15/2020 0700 Gross per 24 hour  Intake 520 ml  Output 1600 ml  Net -1080 ml        Physical Exam: Vital Signs Blood pressure 100/67, pulse 100, temperature 98.6 F (37 C), temperature source Oral, resp. rate 17, height 5\' 9"  (1.753 m), weight 71 kg, SpO2 98 %.  Constitutional:  pt awake, alert, appropriate, sitting up in bed, O2 by [place] 2L, NAD HENT: lipoma on forehead; O2 by [place] 2L Neck:     Comments: almost 100% healed- looks GREAT Cardiovascular: RRR Pulmonary: CTA B/L- no W/R/R- good air movement except decreased at bases B/L Abdominal: soft, NT, slightly distended, hypoactive BS  Genitourinary:    Comments: Foley in place- medium amber urine still Musculoskeletal:     Comments: UEs 5-/5 in deltoids, biceps, triceps, WE, grip and finger abd B/L LEs- 3+/5 in HF, KE 4/5, DF and PF 4+/5 Skin:dry skin-  Neurological:     Mental Status: He is alert.     Comments: Decreased/absent sensation to light touch from L1-S5 B/L Ox3, but higher level cognition/memory appears impaired - no change Psychiatric:  appropriate, quiet     Assessment/Plan: 1. Functional deficits which require 3+ hours per day of interdisciplinary therapy in a comprehensive inpatient rehab setting.  Physiatrist is providing close team  supervision and 24 hour management of active medical problems listed below.  Physiatrist and rehab team continue to assess barriers to discharge/monitor patient progress toward functional and medical goals  Care Tool:  Bathing    Body parts bathed by patient: Right arm,Left arm,Chest,Abdomen,Front perineal area,Right upper leg,Left upper leg,Face   Body parts bathed by helper: Right lower leg,Left lower leg,Buttocks     Bathing assist Assist Level: Moderate Assistance - Patient 50 - 74%     Upper Body Dressing/Undressing Upper body dressing   What is the patient wearing?: Pull over shirt    Upper body assist Assist Level: Supervision/Verbal cueing    Lower Body Dressing/Undressing Lower body dressing      What is the patient wearing?: Incontinence brief     Lower body assist Assist for lower body dressing: 2 Helpers     Toileting Toileting    Toileting assist Assist for toileting: Moderate Assistance - Patient 50 - 74% Assistive Device Comment: wheelchair   Transfers Chair/bed transfer  Transfers assist     Chair/bed transfer assist level: Moderate Assistance - Patient 50 - 74%     Locomotion Ambulation   Ambulation assist      Assist level: 2 helpers Assistive device: Walker-rolling Max distance: 30'   Walk 10 feet activity   Assist     Assist level: 2 helpers Assistive device: [employer]  50 feet activity   Assist Walk 50 feet with 2 turns activity did not occur: Safety/medical concerns         Walk 150 feet activity   Assist Walk 150 feet activity did not occur: Safety/medical concerns         Walk 10 feet on uneven surface  activity   Assist Walk 10 feet on uneven surfaces activity did not occur: Safety/medical concerns         Wheelchair     Assist Will patient use wheelchair at discharge?: Yes (Per PT long term goals) Type of Wheelchair: Manual    Wheelchair assist level: Supervision/Verbal  cueing Max wheelchair distance: 100'    Wheelchair 50 feet with 2 turns activity    Assist        Assist Level: Supervision/Verbal cueing   Wheelchair 150 feet activity     Assist      Assist Level: Moderate Assistance - Patient 50 - 74%   Blood pressure 100/67, pulse 100, temperature 98.6 F (37 C), temperature source Oral, resp. rate 17, height 5\' 9"  (1.753 m), weight 71 kg, SpO2 98 %.  Medical Problem List and Plan: 1.  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  Antithrombotics: -DVT/anticoagulation:  Mechanical: Sequential compression devices, below knee Bilateral lower extremities Will order dopplers due to immobility/tachcyardia. Dopplers (-)- has been 10 days since surgery- will check with NSU if can start Lovenox asap- high risk  12/22- started SQ heparin per NSU- is ordered             -antiplatelet therapy: N/A 3. Pain Management: will continue Oxycodone prn.  4. Mood: LCSW to follow for evaluation and support.              -antipsychotic agents: N/a 5. Neuropsych: This patient is capable of making decisions on his own behalf. 6. Skin/Wound Care: Routine pressure relief measures.  7. Fluids/Electrolytes/Nutrition: Monitor I/O. Check electrolytes in am.              --Noted to be malnourished--albumin 2.2. Will add juven to promote healing.  8. 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              --Continue  foley for few more days till bowel program set and UTI treated.   12/23- discussed with pt/brother- will wait til next week, to not add more burden to pt with all these other issues.   12/24- will d/w removing Foley next Wednesday- pt and family to think about whether they want to try or leave in, for Cancer treatment.  9.  Acute renal failure: Likely due to retention/UTI.             --Labs ordered today and pending             --will check follow up labs in am.  12/24- BUN up to 24- is somewhat dry- will push PO fluids.   10. Acute blood loss anemia: Will recheck H/H in am.  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              --Plans for simulation on 12/27 with MD appt.   12/22- will see if brother can go with pt to appointment - likely at [place].  12/23- brother will go-   12/24- Pt and brother to go to appointment- at [facility]- brother to join him for appt.  12.  Kleb/Proteus UTI: Started on Cipro Day #1/5 13. Resting tachycardia: Will order EKG for baseline as continues to have intermittent left chest pain (question due to neuropathy). 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 Will   order KUB to evaluate stool burden. On multiple laxative throughout the day--will change senna to 2 tabs in am and at noon. Discontinue Miralax/colace for now till regimen evaluated. Schedule suppository at nights with bowel program  12/22- per pt, had BM las tnight with bowel program  12/24- has refused bowel program last 2 nights- strongly encouraged pt to do bowel program with nursing.   14. 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 Will order orthostatic BP. Will likely need TEDs/binder for support.  15. Insomnia: Will schedule Melatonin at nights.  16. Neurogenic bladder- has foley due to urinary rentention- will start Flomax after supper, since having orthostatic hypotension.   12/23- wait on removing  foley- con't flomax 17. Tenting/mild dehydration- will push 6 cups/water daily minimum  12/22- BUN 14- but encourage pt to drink more.  12/24- BUN up to 24- will push PO fluids and recheck Monday  18. Hyperkalemia  12/23- gave Lokelma- will recheck in AM  12/24- K+ 4.4- will recheck Monday  19. Insomnia  12/23- added trazodone 50 mg QHS  12/24- increase to 100 mg QHS    LOS: 3 days A FACE TO FACE EVALUATION WAS PERFORMED  Aidian Salomon 01/15/2020, 8:58 AM

## 2020-01-16 MED ORDER — KCL IN DEXTROSE-NACL 10-5-0.45 MEQ/L-%-% IV SOLN
INTRAVENOUS | Status: DC
  Filled 2020-01-16 (×7): qty 1000

## 2020-01-16 MED ORDER — POTASSIUM CHLORIDE 2 MEQ/ML IV SOLN
INTRAVENOUS | Status: DC
  Filled 2020-01-16 (×2): qty 1000

## 2020-01-16 MED ORDER — DOCUSATE SODIUM 100 MG PO CAPS
100.0000 mg | ORAL_CAPSULE | Freq: Every day | ORAL | Status: DC
  Administered 2020-01-16 – 2020-01-20 (×5): 100 mg via ORAL
  Filled 2020-01-16 (×5): qty 1

## 2020-01-16 NOTE — Progress Notes (Signed)
Bowel program initiated, small BM with dig  Stim, suppository inserted, night shift to continue

## 2020-01-16 NOTE — Progress Notes (Signed)
[place] PHYSICAL MEDICINE & REHABILITATION PROGRESS NOTE   Subjective/Complaints:  Bowels moved, received dig stim    ROS:  Pt denies SOB, abd pain, CP, N/V/, +loose stool,  Objective:   No results found. No results for input(s): WBC, HGB, HCT, PLT in the last 72 hours. Recent Labs    01/15/20 0428  NA 135  K 4.4  CL 97*  CO2 30  GLUCOSE 108*  BUN 24*  CREATININE 0.88  CALCIUM 10.4*    Intake/Output Summary (Last 24 hours) at 01/16/2020 0651 Last data filed at 01/16/2020 0559 Gross per 24 hour  Intake 450 ml  Output 1150 ml  Net -700 ml        Physical Exam: Vital Signs Blood pressure (!) 93/57, pulse (!) 102, temperature 98.9 F (37.2 C), temperature source Oral, resp. rate 16, height 5\' 9"  (1.753 m), weight 71 kg, SpO2 97 %.   General: No acute distress Mood and affect are appropriate Heart: Regular rate and rhythm no rubs murmurs or extra sounds Lungs: Clear to auscultation, breathing unlabored, no rales or wheezes Abdomen: Positive bowel sounds, soft nontender to palpation, nondistended Extremities: No clubbing, cyanosis, or edema   Genitourinary:    Comments: Foley in place- medium amber urine still Musculoskeletal:     Comments: UEs 5-/5 in deltoids, biceps, triceps, WE, grip and finger abd B/L LEs- 3+/5 in HF, KE 4/5, DF and PF 4+/5 Skin:dry skin-  Neurological:     Mental Status: He is alert.     Comments: Decreased/absent sensation to light touch from L1-S5 B/L Ox3, but higher level cognition/memory appears impaired - no change Psychiatric:  appropriate, quiet     Assessment/Plan: 1. Functional deficits which require 3+ hours per day of interdisciplinary therapy in a comprehensive inpatient rehab setting.  Physiatrist is providing close team supervision and 24 hour management of active medical problems listed below.  Physiatrist and rehab team continue to assess barriers to discharge/monitor patient progress toward functional  and medical goals  Care Tool:  Bathing    Body parts bathed by patient: Right upper leg,Left upper leg,Front perineal area,Abdomen,Chest,Left arm,Right arm   Body parts bathed by helper: Buttocks,Right lower leg,Left lower leg     Bathing assist Assist Level: Moderate Assistance - Patient 50 - 74%     Upper Body Dressing/Undressing Upper body dressing   What is the patient wearing?: Pull over shirt    Upper body assist Assist Level: Supervision/Verbal cueing    Lower Body Dressing/Undressing Lower body dressing      What is the patient wearing?: Incontinence brief     Lower body assist Assist for lower body dressing: Moderate Assistance - Patient 50 - 74%     Toileting Toileting    Toileting assist Assist for toileting: Moderate Assistance - Patient 50 - 74% Assistive Device Comment: wheelchair   Transfers Chair/bed transfer  Transfers assist     Chair/bed transfer assist level: Moderate Assistance - Patient 50 - 74%     Locomotion Ambulation   Ambulation assist      Assist level: 2 helpers Assistive device: Walker-rolling Max distance: 60'   Walk 10 feet activity   Assist     Assist level: 2 helpers Assistive device: Walker-rolling   Walk 50 feet activity   Assist Walk 50 feet with 2 turns activity did not occur: Safety/medical concerns  Assist level: 2 helpers Assistive device: Walker-rolling    Walk 150 feet activity   Assist Walk 150 feet activity did not  occur: Safety/medical concerns         Walk 10 feet on uneven surface  activity   Assist Walk 10 feet on uneven surfaces activity did not occur: Safety/medical concerns         Wheelchair     Assist Will patient use wheelchair at discharge?: Yes (Per PT long term goals) Type of Wheelchair: Manual    Wheelchair assist level: Supervision/Verbal cueing Max wheelchair distance: 100'    Wheelchair 50 feet with 2 turns activity    Assist        Assist  Level: Supervision/Verbal cueing   Wheelchair 150 feet activity     Assist      Assist Level: Moderate Assistance - Patient 50 - 74%   Blood pressure (!) 93/57, pulse (!) 102, temperature 98.9 F (37.2 C), temperature source Oral, resp. rate 16, height 5\' 9"  (1.753 m), weight 71 kg, SpO2 97 %.  Medical Problem List and Plan: 1.  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  Antithrombotics: -DVT/anticoagulation:  Mechanical: Sequential compression devices, below knee Bilateral lower extremities Will order dopplers due to immobility/tachcyardia. Dopplers (-)- has been 10 days since surgery- will check with NSU if can start Lovenox asap- high risk  12/22- started SQ heparin per NSU- is ordered             -antiplatelet therapy: N/A 3. Pain Management: will continue Oxycodone prn.  4. Mood: LCSW to follow for evaluation and support.              -antipsychotic agents: N/a 5. Neuropsych: This patient is capable of making decisions on his own behalf. 6. Skin/Wound Care: Routine pressure relief measures.  7. Fluids/Electrolytes/Nutrition: Monitor I/O. Check electrolytes in am.              --Noted to be malnourished--albumin 2.2. Will add juven to promote healing.  8. 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              --Continue foley for few more days till bowel program set and UTI treated.     plan removing Foley next Wednesday- pt and family to think about whether they want to try or leave in, for Cancer treatment.  9.  Acute renal failure: Likely due to retention/UTI.             --Labs ordered today and pending             --will check follow up labs in am.  12/24- BUN up to 24- is somewhat dry- will  push PO fluids.  Unable to take adequate amt po start IVF  10. Acute blood loss anemia: Will recheck H/H in am.  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              --Plans for simulation on 12/27 with MD appt.   12/22- will see if brother can go with pt to appointment - likely at [place].  12/23- brother will go-   12/24- Pt and brother to go to appointment- at [facility]- brother to join  him for appt.  12.  Kleb/Proteus UTI: Started on Cipro Day #1/5 13. 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 Will order orthostatic BP. Will likely need TEDs/binder for support.  15. Insomnia: Will schedule Melatonin at nights.  16. Neurogenic bladder- has foley due to urinary rentention- will start Flomax after supper, since having orthostatic hypotension.   12/23- wait on removing foley- con't flomax Significant orthostatic changes will hold until pt rehydrated  17. Tenting/mild dehydration- will push 6 cups/water daily minimum  Only 327ml recorded 12/24 with low BP will start IVF 19ml /hr 18. Hyperkalemia  12/23- gave Lokelma- will recheck in AM  12/24- K+ 4.4- will recheck Monday  19. Insomnia  12/23- added trazodone 50 mg QHS  12/24- increase to 100 mg QHS    LOS: 4 days A FACE TO FACE EVALUATION WAS PERFORMED  Charlett Blake 01/16/2020, 6:51 AM

## 2020-01-16 NOTE — Progress Notes (Signed)
Continuee bowel program. Dig stimulation done, very small amount of mushy, light brown stool.Peri care done.

## 2020-01-17 ENCOUNTER — Inpatient Hospital Stay (HOSPITAL_COMMUNITY): Payer: Medicare Other

## 2020-01-17 NOTE — Progress Notes (Signed)
Occupational Therapy Session Note  Patient Details  Name: Gregory Bautista MRN: [id number] Date of Birth: 1940-03-28  Today's Date: 01/17/2020 OT Individual Time: 0700-0745 OT Individual Time Calculation (min): 45 min  and Today's Date: 01/17/2020 OT Missed Time: 15 Minutes Missed Time Reason: Other (comment) (eating breakfast)   Short Term Goals: Week 1:  OT Short Term Goal 1 (Week 1): Pt will be able to stand from EOB to Rw with CGA to prepare for LB self care. OT Short Term Goal 2 (Week 1): Pt will transfer to toilet with min A. OT Short Term Goal 3 (Week 1): Pt will complete toileting with min A. OT Short Term Goal 4 (Week 1): Pt will don pants with min a. OT Short Term Goal 5 (Week 1): Pt will be able to tolerate standing for 5 min with RW with CGA.  Skilled Therapeutic Interventions/Progress Updates:    Pt sitting in bed with HOB elevated eating breakfast. O2 tubing laying to pt's side; O2 sats 76% on RA. O2 [place] placed in nose and O2 checked after 2 mins. O2 sats 84% on 2L O2. O2 increased to 3L. RN notified. OT intervention with focus on self feeding while seated EOB. Pt required multiple rest breaks during tasks. Pt declined washing up and has not clothes to change into. Pt required min A for supine>sit EOB and max A for sit>supine with HOB elevated.    Therapy Documentation Precautions:  Precautions Precautions: Back,Fall Precaution Comments: reviewed precautions Restrictions Weight Bearing Restrictions: No General: General OT Amount of Missed Time: 15 Minutes Pain: Pt c/o discomfort at incision site; RN aware and meds admin prior to therapy  Therapy/Group: Individual Therapy  Leroy Libman 01/17/2020, 8:08 AM

## 2020-01-17 NOTE — Progress Notes (Signed)
Occupational Therapy Session Note  Patient Details  Name: Gregory Bautista MRN: [id number] Date of Birth: 1940/06/15  Today's Date: 01/17/2020 OT Individual Time: 1300-1340 OT Individual Time Calculation (min): 40 min    Short Term Goals: Week 1:  OT Short Term Goal 1 (Week 1): Pt will be able to stand from EOB to Rw with CGA to prepare for LB self care. OT Short Term Goal 2 (Week 1): Pt will transfer to toilet with min A. OT Short Term Goal 3 (Week 1): Pt will complete toileting with min A. OT Short Term Goal 4 (Week 1): Pt will don pants with min a. OT Short Term Goal 5 (Week 1): Pt will be able to tolerate standing for 5 min with RW with CGA.  Skilled Therapeutic Interventions/Progress Updates:    Pt resting in bed with HOB elevated. Pt's unfinished sandwich was on his tray. When asked if he was going to finish his sandwich, pt stated it was too difficult to swallow.  Difficult to determine why it was difficult. Pt agreeable to trying some ice cream. OT intervention with focus on bed mobility, sitting balance, and sit<>stand from EOB. Bed mobilit with min A. Sitting balance with supervisin. Sit<>stand and standing balance with min A/CGA. Pt fatigues easily and requires multiple rest breaks. O2 90% on 2L O2 via [place]. Pt remained in bed with all needs within reach and bed alarm activated.   Therapy Documentation Precautions:  Precautions Precautions: Back,Fall Precaution Comments: reviewed precautions Restrictions Weight Bearing Restrictions: No  Pain:  Pt with no c/o pain this afternoon   Therapy/Group: Individual Therapy  Leroy Libman 01/17/2020, 1:46 PM

## 2020-01-17 NOTE — Progress Notes (Signed)
Pt agreable to bowel program. He was positioned to his L side and digital stimulation done. No stool came out. Dulcolax suppository administered. On second dig stim, RN got a moderate amount of mushy stool. Peri care done.

## 2020-01-17 NOTE — Progress Notes (Signed)
[place] PHYSICAL MEDICINE & REHABILITATION PROGRESS NOTE   Subjective/Complaints: No dizziness, pt states bowels are starting to moving  BPs firming up  99sys sitting this am   Po fluids remain low , now on IVF to supplement  Type 6 BM x 2 yest  ROS:  Pt denies SOB, abd pain, CP, N/V/, +loose stool,  Objective:   No results found. No results for input(s): WBC, HGB, HCT, PLT in the last 72 hours. Recent Labs    01/15/20 0428  NA 135  K 4.4  CL 97*  CO2 30  GLUCOSE 108*  BUN 24*  CREATININE 0.88  CALCIUM 10.4*    Intake/Output Summary (Last 24 hours) at 01/17/2020 0648 Last data filed at 01/17/2020 0604 Gross per 24 hour  Intake 745 ml  Output 1045 ml  Net -300 ml        Physical Exam: Vital Signs Blood pressure (!) 98/55, pulse 100, temperature 98.4 F (36.9 C), temperature source Oral, resp. rate 16, height 5\' 9"  (1.753 m), weight 71 kg, SpO2 98 %.     General: No acute distress Mood and affect are appropriate Heart: Regular rate and rhythm no rubs murmurs or extra sounds Lungs: Clear to auscultation, breathing unlabored, no rales or wheezes Abdomen: Positive bowel sounds, soft nontender to palpation, nondistended Extremities: No clubbing, cyanosis, or edema Skin: No evidence of breakdown, no evidence of rash  Genitourinary:    Comments: Foley in place- clear yellow  urine draining  Musculoskeletal:     Comments: UEs 5-/5 in deltoids, biceps, triceps, WE, grip and finger abd B/L LEs- 3+/5 in HF, KE 4/5, DF and PF 4+/5 Skin:dry skin-  Neurological:     Mental Status: He is alert.     Comments:  Ox3, slow processing speed- no change Psychiatric:  appropriate, quiet     Assessment/Plan: 1. Functional deficits which require 3+ hours per day of interdisciplinary therapy in a comprehensive inpatient rehab setting.  Physiatrist is providing close team supervision and 24 hour management of active medical problems listed below.  Physiatrist and  rehab team continue to assess barriers to discharge/monitor patient progress toward functional and medical goals  Care Tool:  Bathing    Body parts bathed by patient: Right upper leg,Left upper leg,Front perineal area,Abdomen,Chest,Left arm,Right arm   Body parts bathed by helper: Buttocks,Right lower leg,Left lower leg     Bathing assist Assist Level: Moderate Assistance - Patient 50 - 74%     Upper Body Dressing/Undressing Upper body dressing   What is the patient wearing?: Pull over shirt    Upper body assist Assist Level: Supervision/Verbal cueing    Lower Body Dressing/Undressing Lower body dressing      What is the patient wearing?: Incontinence brief     Lower body assist Assist for lower body dressing: Moderate Assistance - Patient 50 - 74%     Toileting Toileting    Toileting assist Assist for toileting: Moderate Assistance - Patient 50 - 74% Assistive Device Comment: wheelchair   Transfers Chair/bed transfer  Transfers assist     Chair/bed transfer assist level: Moderate Assistance - Patient 50 - 74%     Locomotion Ambulation   Ambulation assist      Assist level: 2 helpers Assistive device: Walker-rolling Max distance: 60'   Walk 10 feet activity   Assist     Assist level: 2 helpers Assistive device: Walker-rolling   Walk 50 feet activity   Assist Walk 50 feet with 2 turns activity  did not occur: Safety/medical concerns  Assist level: 2 helpers Assistive device: Walker-rolling    Walk 150 feet activity   Assist Walk 150 feet activity did not occur: Safety/medical concerns         Walk 10 feet on uneven surface  activity   Assist Walk 10 feet on uneven surfaces activity did not occur: Safety/medical concerns         Wheelchair     Assist Will patient use wheelchair at discharge?: Yes (Per PT long term goals) Type of Wheelchair: Manual    Wheelchair assist level: Supervision/Verbal cueing Max wheelchair  distance: 100'    Wheelchair 50 feet with 2 turns activity    Assist        Assist Level: Supervision/Verbal cueing   Wheelchair 150 feet activity     Assist      Assist Level: Moderate Assistance - Patient 50 - 74%   Blood pressure (!) 98/55, pulse 100, temperature 98.4 F (36.9 C), temperature source Oral, resp. rate 16, height 5\' 9"  (1.753 m), weight 71 kg, SpO2 98 %.  Medical Problem List and Plan: 1.  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  Antithrombotics: -DVT/anticoagulation:  Mechanical: Sequential compression devices, below knee Bilateral lower extremities Will order dopplers due to immobility/tachcyardia. Dopplers (-)- has been 10 days since surgery- will check with NSU if can start Lovenox asap- high risk  12/22- started SQ heparin per NSU- is ordered             -antiplatelet therapy: N/A 3. Pain Management: will continue Oxycodone prn.  4. Mood: LCSW to follow for evaluation and support.              -antipsychotic agents: N/a 5. Neuropsych: This patient is capable of making decisions on his own behalf. 6. Skin/Wound Care: Routine pressure relief measures.  7. Fluids/Electrolytes/Nutrition: Monitor I/O. Check electrolytes in am.              --Noted to be malnourished--albumin 2.2. Will add juven to promote healing.  8. 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              --Continue foley for few more days till bowel program set and UTI treated.     plan removing Foley next Wednesday- pt and family to think about whether they want to try or leave in, for Cancer treatment.  9.  Acute renal failure: Likely due to retention/UTI.             --Labs ordered today and pending              --will check follow up labs in am.  12/24- BUN up to 24- is somewhat dry- will push PO fluids.  Unable to take adequate amt po start IVF  10. Acute blood loss anemia: Will recheck H/H in am.  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              --Plans for simulation on 12/27 with MD appt.   12/22- will see if brother can go with  pt to appointment - likely at [place].  12/23- brother will go-   12/24- Pt and brother to go to appointment- at [facility]- brother to join him for appt.  12.  Kleb/Proteus UTI: Started on Cipro Day #1/5 13. 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 Will order orthostatic BP. Will likely need TEDs/binder for support.  15. Insomnia: Will schedule Melatonin at nights.  16. Neurogenic bladder- has foley due to urinary rentention- will start Flomax after supper, since having orthostatic hypotension.   12/23- wait on removing foley- con't flomax Significant orthostatic changes will hold flomax until pt rehydrated  17. Tenting/mild dehydration- will push 6 cups/water daily minimum  Only 331ml recorded 12/24 with low BP will start IVF 107ml /hr Monitor in therapy  18. Hyperkalemia  12/23- gave Lokelma- will recheck in AM  12/24- K+ 4.4- will recheck Monday  19. Insomnia  12/23- added trazodone 50 mg QHS  12/24- increase to 100 mg QHS    LOS: 5 days A FACE TO FACE EVALUATION WAS PERFORMED  Charlett Blake 01/17/2020, 6:48 AM

## 2020-01-17 NOTE — Progress Notes (Addendum)
Physical Therapy Session Note  Patient Details  Name: Gregory Bautista MRN: [id number] Date of Birth: September 13, 1940  Today's Date: 01/17/2020 PT Individual Time: 1009-1050; 1405-1505 PT Individual Time Calculation (min): 41 min , 60 min  Short Term Goals: Week 1:  PT Short Term Goal 1 (Week 1): Pt will perform least restrictive transfer with min A consistently PT Short Term Goal 2 (Week 1): Pt will ambulate x 50 ft with assist x 1 PT Short Term Goal 3 (Week 1): Pt will initiate stair training  Skilled Therapeutic Interventions/Progress Updates:  tx 1:  Pt dozing in bed; easily awakened.  He rated pain 2/10, burning type, L lateral trunk/axilla region, premedicated. Pt on 3L O2; O2 sats 100%, HR 113.  PT lowered O2 to 2L/min.  Saturation 97% 5 min later. After 10 x 1 calf raises, HR 123, O2 sats 95%.  HR down to 115 with seated deep breathing x 1 min.  Supine>< sit with min assist.  Sit>< stand with min assist.   neuromuscular re-education via demo and multimodal cues for supine-  20 x 1 L/R long arc quad knee extensions ; standing with bil UE support- 20 x 1 R/L wt shifting focused on L knee control, 10 x 1 each- calf raises, mini squats with guarding at L knee.  Seated rest required between sets.  At end of session, pt resting in bed with needs at hand, alarm set, HOB raised.   tx 2:  Pt resting in bed.  He stated he had " a little soreness L lateral trunk, but no real pain:.  Pt on 2L O2 via [place], and IV.[place] assist to sit EOB and sit> stand to RW for stand pivot transfer to R to wc.  L knee did not buckle.  Seated neuromuscular re-education via multimodal cues and demo for 15 x 1 bil adductor squeezes.  Sit> stand with in assist, to RW. Gait training on level tile x 45' with CGA, +2 needed for w/c follow, lines and leads.  Distance limited by fatigue.  O2 sats 96%, HR 137 after gait.  With cue for slow deep breathing, HR 121 after 1 minute.  Pt reported that he needed to have BM.   Stand pivot transfer to [facility] over toilet, min assist.  Pt passed gas but no BM.  Peri care by pt; checked by PT for cleanliness.  Hand hygiene via hand cleaner.  At end of session, pt seated in w/c with needs at hand, on 2L o2 via [place], seat belt alarm set.        Therapy Documentation Precautions:  Precautions Precautions: Back,Fall Precaution Comments: reviewed precautions Restrictions Weight Bearing Restrictions: No       Therapy/Group: Individual Therapy  Shametra Cumberland 01/17/2020, 12:30 PM

## 2020-01-17 NOTE — Progress Notes (Signed)
Radiation Oncology         [phone]) [phone] ________________________________  Name: Gregory Bautista: [id number]  Date: 01/18/2020  DOB: 1940-08-04  Re-Evaluation Note  CC: [name], MD  [name], *    ICD-10-CM   1. Mass of upper lobe of left lung  R91.8     Diagnosis: Clinical stage IIIA (T4, N0) left upper lobe adenocarcinoma  Narrative:  The patient returns today to discuss radiation treatment options. he was seen in consultation on 12/24/2019, during which time we discussed the natural history of lung cancer and general treatment, highlighting the role of radiotherapy in the management. he was scheduled to undergo CT simulation the following day. However, the patient was seen by Dr. Arnoldo Morale after our visit and recommended that he proceed with surgery. 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 Pathology revealed adenocarcinoma. He was evaluated in follow-up with me on 12/31/2019, however, it was too early to initiate postoperative radiation therapy given that it could delay wound healing.  On 01/02/2020, the patient was evaluated by Dr. Sherley Bounds, neurosurgeon, and requested to remain admitted for pain control. That was considered reasonable given that he was still requiring IV pain medications. He is currently in the inpatient rehab unit at [facility].  On review of systems, the patient reports improvement in his back pain since his surgery. He denies numbness in his lower extremities.  He has had some problems with urinary retention and is undergoing intermittent in and out caths at this time.  He reports being too weak to stand on his own at this time.  He is  accompanied by his brother on evaluation today   Allergies:  is allergic to penicillins, wellbutrin [bupropion], and morphine and related.  Meds: No current facility-administered medications for this encounter.   Current Outpatient Medications  Medication Sig Dispense Refill  . acetaminophen (TYLENOL) 325 MG tablet Take 1-2 tablets (325-650 mg total) by mouth every 4 (four) hours as needed for mild pain.     Facility-Administered Medications Ordered in Other Encounters  Medication Dose Route Frequency Provider Last Rate Last Admin  . acetaminophen (TYLENOL) tablet 325-650 mg  325-650 mg Oral Q4H PRN Bary Leriche, PA-C   650 mg at 01/18/20 0855  . albuterol (VENTOLIN HFA) 108 (90 Base) MCG/ACT inhaler 2 puff  2 puff Inhalation Q6H PRN Love, Pamela S, PA-C      . alum & mag hydroxide-simeth (MAALOX/MYLANTA) 200-200-20 MG/5ML suspension 30 mL  30 mL Oral Q4H PRN Love, Pamela S, PA-C      . bisacodyl (DULCOLAX) suppository 10 mg  10 mg Rectal Daily PRN Love, Pamela S, PA-C      . bisacodyl (DULCOLAX) suppository 10 mg  10 mg Rectal QPC supper Reesa Chew S, PA-C   10 mg at 01/17/20 2019  . Chlorhexidine Gluconate Cloth 2 % PADS 6 each  6 each Topical Daily Lovorn, Jinny Blossom, MD   6 each at 01/18/20 0901  . cyclobenzaprine (FLEXERIL) tablet 10 mg  10 mg Oral TID PRN Bary Leriche, PA-C      . dextrose 5 % and 0.45 % NaCl with KCl 10 mEq/L infusion   Intravenous Continuous Lovorn, Megan, MD 50 mL/hr at 01/18/20 1701 New Bag at  01/18/20 1701  . diphenhydrAMINE (BENADRYL) 12.5 MG/5ML elixir 12.5-25 mg  12.5-25 mg Oral Q6H PRN Love, Pamela S, PA-C      . docusate sodium (COLACE) capsule 100 mg  100 mg Oral Daily Charlett Blake, MD   100 mg at 01/18/20 0855  . feeding supplement (ENSURE ENLIVE / ENSURE PLUS) liquid 237 mL  237 mL Oral TID BM Love, Ivan Anchors, PA-C   237 mL at 01/18/20 1031  . gabapentin (NEURONTIN) capsule 100 mg  100 mg Oral TID Love, Pamela S, PA-C   100 mg at 01/18/20 1655  .  guaiFENesin-dextromethorphan (ROBITUSSIN DM) 100-10 MG/5ML syrup 5-10 mL  5-10 mL Oral Q6H PRN Love, Pamela S, PA-C      . heparin injection 5,000 Units  5,000 Units Subcutaneous Q8H Bary Leriche, PA-C   5,000 Units at 01/18/20 1655  . ipratropium (ATROVENT) 0.06 % nasal spray 2 spray  2 spray Each Nare TID Bary Leriche, PA-C   2 spray at 01/18/20 1656  . latanoprost (XALATAN) 0.005 % ophthalmic solution 1 drop  1 drop Both Eyes QHS Bary Leriche, PA-C   1 drop at 01/17/20 2203  . lidocaine (LIDODERM) 5 % 1 patch  1 patch Transdermal Daily Bary Leriche, PA-C   1 patch at 01/18/20 0855  . melatonin tablet 3 mg  3 mg Oral QHS Love, Pamela S, PA-C   3 mg at 01/17/20 2202  . menthol-cetylpyridinium (CEPACOL) lozenge 3 mg  1 lozenge Oral PRN Love, Pamela S, PA-C       Or  . phenol (CHLORASEPTIC) mouth spray 1 spray  1 spray Mouth/Throat PRN Love, Pamela S, PA-C      . mometasone-formoterol (DULERA) 200-5 MCG/ACT inhaler 2 puff  2 puff Inhalation BID Bary Leriche, PA-C   2 puff at 01/18/20 0831  . multivitamin with minerals tablet 1 tablet  1 tablet Oral Daily Bary Leriche, PA-C   1 tablet at 01/18/20 1884  . nutrition supplement (JUVEN) (JUVEN) powder packet 1 packet  1 packet Oral BID BM Bary Leriche, PA-C   1 packet at 01/18/20 1031  . oxyCODONE (Oxy IR/ROXICODONE) immediate release tablet 5-10 mg  5-10 mg Oral Q3H PRN Bary Leriche, PA-C   10 mg at 01/18/20 1752  . polyethylene glycol (MIRALAX / GLYCOLAX) packet 17 g  17 g Oral Daily PRN Love, Pamela S, PA-C      . prochlorperazine (COMPAZINE) tablet 5-10 mg  5-10 mg Oral Q6H PRN Love, Pamela S, PA-C       Or  . prochlorperazine (COMPAZINE) injection 5-10 mg  5-10 mg Intramuscular Q6H PRN Love, Pamela S, PA-C       Or  . prochlorperazine (COMPAZINE) suppository 12.5 mg  12.5 mg Rectal Q6H PRN Love, Pamela S, PA-C      . senna-docusate (Senokot-S) tablet 2 tablet  2 tablet Oral Q breakfast Bary Leriche, PA-C   2 tablet at 01/18/20  0855  . sodium phosphate (FLEET) 7-19 GM/118ML enema 1 enema  1 enema Rectal Once PRN Love, Pamela S, PA-C      . timolol (TIMOPTIC) 0.5 % ophthalmic solution 1 drop  1 drop Both Eyes BID Love, Pamela S, PA-C   1 drop at 01/18/20 0900  . traMADol (ULTRAM) tablet 50 mg  50 mg Oral QID PRN Love, Pamela S, PA-C      . traZODone (DESYREL) tablet 100 mg  100 mg Oral QHS Courtney Heys, MD  100 mg at 01/17/20 2202    Physical Findings: The patient is in no acute distress. Patient is alert and oriented.  temperature is 97.8 F (36.6 C). His blood pressure is 103/67 and his pulse is 113 (abnormal). His respiration is 20 and oxygen saturation is 100%.  No significant changes. Lungs are clear to auscultation bilaterally. Heart has regular rate and rhythm. No palpable cervical, supraclavicular, or axillary adenopathy. Abdomen soft, non-tender, normal bowel sounds.  Well-healed scar in the upper back region.  No signs of drainage or infection.  Patient has good strength in his lower extremities.  Remains in a wheelchair for the exam.  Lab Findings: Lab Results  Component Value Date   WBC 7.0 01/18/2020   HGB 9.8 (L) 01/18/2020   HCT 31.6 (L) 01/18/2020   MCV 89.5 01/18/2020   PLT 244 01/18/2020    Radiographic Findings: DG Thoracic Spine 2 View  Result Date: 12/30/2019 CLINICAL DATA:  T1 through T4 fusion. EXAM: DG C-ARM 1-60 MIN; THORACIC SPINE 2 VIEWS FLUOROSCOPY TIME:  Fluoroscopy Time:  2 minutes and 10 seconds. COMPARISON:  None. FINDINGS: Four C-arm fluoroscopic images were obtained intraoperatively and submitted for post operative interpretation. These images demonstrate placement of pedicle screws at T1, T2, T4, and T5. There is limited fluoroscopic visualization. Please see the performing provider's procedural report for further detail. IMPRESSION: Intraoperative fluoroscopic imaging, as detailed above. Electronically Signed   By: Margaretha Sheffield MD   On: 12/30/2019 20:03   DG Abd 1  View  Result Date: 01/12/2020 CLINICAL DATA:  Abdominal pain, constipation EXAM: ABDOMEN - 1 VIEW COMPARISON:  None. FINDINGS: Supine frontal view of the abdomen and pelvis excludes the pubic symphysis by collimation. No bowel obstruction or ileus. Significant retained stool throughout the colon consistent with constipation. Multiple surgical clips throughout the upper abdomen. Curvilinear calcification right mid abdomen of uncertain etiology, but could be associated with the right kidney. No acute bony abnormalities. IMPRESSION: 1. Significant fecal retention compatible with constipation. 2. Curvilinear calcification right mid abdomen, of uncertain etiology. This could be related to the right kidney. If further evaluation is desired, ultrasound or CT could be performed. Electronically Signed   By: Randa Ngo M.D.   On: 01/12/2020 17:17   DG C-Arm 1-60 Min  Result Date: 12/30/2019 CLINICAL DATA:  T1 through T4 fusion. EXAM: DG C-ARM 1-60 MIN; THORACIC SPINE 2 VIEWS FLUOROSCOPY TIME:  Fluoroscopy Time:  2 minutes and 10 seconds. COMPARISON:  None. FINDINGS: Four C-arm fluoroscopic images were obtained intraoperatively and submitted for post operative interpretation. These images demonstrate placement of pedicle screws at T1, T2, T4, and T5. There is limited fluoroscopic visualization. Please see the performing provider's procedural report for further detail. IMPRESSION: Intraoperative fluoroscopic imaging, as detailed above. Electronically Signed   By: Margaretha Sheffield MD   On: 12/30/2019 20:03   VAS [place] LOWER EXTREMITY VENOUS (DVT)  Result Date: 01/12/2020  Lower Venous DVT Study Other Indications: Immobility. Comparison Study: No previous scan Performing Technologist: Vonzell Schlatter RVT  Examination Guidelines: A complete evaluation includes B-mode imaging, spectral Doppler, color Doppler, and power Doppler as needed of all accessible portions of each vessel. Bilateral testing is considered an integral  part of a complete examination. Limited examinations for reoccurring indications may be performed as noted. The reflux portion of the exam is performed with the patient in reverse Trendelenburg.  +---------+---------------+---------+-----------+----------+--------------+ RIGHT    CompressibilityPhasicitySpontaneityPropertiesThrombus Aging +---------+---------------+---------+-----------+----------+--------------+ CFV      Full  Yes      Yes                                 +---------+---------------+---------+-----------+----------+--------------+ SFJ      Full                                                        +---------+---------------+---------+-----------+----------+--------------+ FV Prox  Full                                                        +---------+---------------+---------+-----------+----------+--------------+ FV Mid   Full                                                        +---------+---------------+---------+-----------+----------+--------------+ FV DistalFull                                                        +---------+---------------+---------+-----------+----------+--------------+ PFV      Full                                                        +---------+---------------+---------+-----------+----------+--------------+ POP      Full           Yes      Yes                                 +---------+---------------+---------+-----------+----------+--------------+ PTV      Full                                                        +---------+---------------+---------+-----------+----------+--------------+ PERO     Full                                                        +---------+---------------+---------+-----------+----------+--------------+   +---------+---------------+---------+-----------+----------+--------------+ LEFT     CompressibilityPhasicitySpontaneityPropertiesThrombus Aging  +---------+---------------+---------+-----------+----------+--------------+ CFV      Full           Yes      Yes                                 +---------+---------------+---------+-----------+----------+--------------+ SFJ  Full                                                        +---------+---------------+---------+-----------+----------+--------------+ FV Prox  Full                                                        +---------+---------------+---------+-----------+----------+--------------+ FV Mid   Full                                                        +---------+---------------+---------+-----------+----------+--------------+ FV DistalFull                                                        +---------+---------------+---------+-----------+----------+--------------+ PFV      Full                                                        +---------+---------------+---------+-----------+----------+--------------+ POP      Full           Yes      Yes                                 +---------+---------------+---------+-----------+----------+--------------+ PTV      Full                                                        +---------+---------------+---------+-----------+----------+--------------+ PERO     Full                                                        +---------+---------------+---------+-----------+----------+--------------+     Summary: RIGHT: - There is no evidence of deep vein thrombosis in the lower extremity.  - No cystic structure found in the popliteal fossa.  LEFT: - There is no evidence of deep vein thrombosis in the lower extremity.  - No cystic structure found in the popliteal fossa.  *See table(s) above for measurements and observations. Electronically signed by Harold Barban MD on 01/12/2020 at 95:05:45 PM.    Final     Impression: Clinical stage IIIA (T4, N0) left upper lobe adenocarcinoma  As above the  patient has undergone surgical decompression.  I discussed the pathologic findings with the patient and his brother.  His daughter was also present  by cell phone.  Patient and his brother were under the impression that he would proceed with chemotherapy first however I discussed that in general we would proceed with radiation initially and then follow this with chemotherapy.  To clarify this issue I spoke with Dr. Arnoldo Morale today.  Dr. Arnoldo Morale recommends starting radiation therapy early next week as planned.  The patient has been unable to complete complete his staging work-up with PET scan since he has been in the hospital since his surgery.  Plan:  Patient is scheduled for CT simulation later today.  He will begin treatments to the upper thoracic spine and lung mass early next week.  Anticipate 14 treatments directed at the surgical bed and the large left lung mass.  Total time spent in this encounter was 35 minutes which included reviewing the patient's most recent surgeries, pathology report, admission, follow-ups, physical examination, and documentation.  -----------------------------------  Blair Promise, PhD, MD  This document serves as a record of services personally performed by Gery Pray, MD. It was created on his behalf by Clerance Lav, a trained medical scribe. The creation of this record is based on the scribe's personal observations and the provider's statements to them. This document has been checked and approved by the attending provider.

## 2020-01-18 ENCOUNTER — Inpatient Hospital Stay (HOSPITAL_COMMUNITY): Payer: Medicare Other

## 2020-01-18 ENCOUNTER — Telehealth: Payer: Self-pay | Admitting: Internal Medicine

## 2020-01-18 ENCOUNTER — Ambulatory Visit
Admission: RE | Admit: 2020-01-18 | Discharge: 2020-01-18 | Disposition: A | Discharge status: Home / Self Care | Payer: Medicare Other | Source: Ambulatory Visit | Attending: Radiation Oncology | Admitting: Radiation Oncology

## 2020-01-18 ENCOUNTER — Inpatient Hospital Stay (HOSPITAL_COMMUNITY): Payer: Medicare Other | Admitting: Occupational Therapy

## 2020-01-18 ENCOUNTER — Encounter: Payer: Self-pay | Admitting: Radiation Oncology

## 2020-01-18 ENCOUNTER — Inpatient Hospital Stay (HOSPITAL_COMMUNITY): Payer: Medicare Other | Admitting: Physical Therapy

## 2020-01-18 ENCOUNTER — Encounter: Payer: Self-pay | Admitting: *Deleted

## 2020-01-18 VITALS — BP 103/67 | HR 113 | Temp 97.8°F | Resp 20 | Ht 69.0 in | Wt 158.0 lb

## 2020-01-18 DIAGNOSIS — R531 Weakness: Secondary | ICD-10-CM | POA: Insufficient documentation

## 2020-01-18 DIAGNOSIS — R918 Other nonspecific abnormal finding of lung field: Secondary | ICD-10-CM

## 2020-01-18 DIAGNOSIS — C3412 Malignant neoplasm of upper lobe, left bronchus or lung: Secondary | ICD-10-CM | POA: Diagnosis present

## 2020-01-18 DIAGNOSIS — N319 Neuromuscular dysfunction of bladder, unspecified: Secondary | ICD-10-CM

## 2020-01-18 DIAGNOSIS — C7951 Secondary malignant neoplasm of bone: Secondary | ICD-10-CM | POA: Diagnosis not present

## 2020-01-18 DIAGNOSIS — E875 Hyperkalemia: Secondary | ICD-10-CM

## 2020-01-18 DIAGNOSIS — G8918 Other acute postprocedural pain: Secondary | ICD-10-CM

## 2020-01-18 DIAGNOSIS — G479 Sleep disorder, unspecified: Secondary | ICD-10-CM

## 2020-01-18 DIAGNOSIS — Z79899 Other long term (current) drug therapy: Secondary | ICD-10-CM | POA: Diagnosis not present

## 2020-01-18 LAB — BASIC METABOLIC PANEL
Anion gap: 7 (ref 5–15)
BUN: 15 mg/dL (ref 8–23)
CO2: 28 mmol/L (ref 22–32)
Calcium: 10.2 mg/dL (ref 8.9–10.3)
Chloride: 99 mmol/L (ref 98–111)
Creatinine, Ser: 0.86 mg/dL (ref 0.61–1.24)
GFR, Estimated: >60 mL/min (ref >60)
Glucose, Bld: 112 mg/dL — ABNORMAL HIGH (ref 70–99)
Potassium: 4.6 mmol/L (ref 3.5–5.1)
Sodium: 134 mmol/L — ABNORMAL LOW (ref 135–145)

## 2020-01-18 LAB — CBC WITH DIFFERENTIAL/PLATELET
Abs Immature Granulocytes: 0.04 10*3/uL (ref 0.00–0.07)
Basophils Absolute: 0 10*3/uL (ref 0.0–0.1)
Basophils Relative: 0 %
Eosinophils Absolute: 0.2 10*3/uL (ref 0.0–0.5)
Eosinophils Relative: 2 %
HCT: 31.6 % — ABNORMAL LOW (ref 39.0–52.0)
Hemoglobin: 9.8 g/dL — ABNORMAL LOW (ref 13.0–17.0)
Immature Granulocytes: 1 %
Lymphocytes Relative: 34 %
Lymphs Abs: 2.3 10*3/uL (ref 0.7–4.0)
MCH: 27.8 pg (ref 26.0–34.0)
MCHC: 31 g/dL (ref 30.0–36.0)
MCV: 89.5 fL (ref 80.0–100.0)
Monocytes Absolute: 0.8 10*3/uL (ref 0.1–1.0)
Monocytes Relative: 11 %
Neutro Abs: 3.6 10*3/uL (ref 1.7–7.7)
Neutrophils Relative %: 52 %
Platelets: 244 10*3/uL (ref 150–400)
RBC: 3.53 MIL/uL — ABNORMAL LOW (ref 4.22–5.81)
RDW: 12.3 % (ref 11.5–15.5)
WBC: 7 10*3/uL (ref 4.0–10.5)
nRBC: 0 % (ref 0.0–0.2)

## 2020-01-18 NOTE — Telephone Encounter (Signed)
Gregory Bautista has been rescheduled to see Dr. Julien Nordmann on 1/3 at 2:15pm w/labs at 1;45pm. Pt is currently in the hospital

## 2020-01-18 NOTE — Progress Notes (Signed)
Physical Therapy Session Note  Patient Details  Name: Gregory Bautista MRN: [id number] Date of Birth: 06/07/1940  Today's Date: 01/18/2020 PT Individual Time: 0905-0950 PT Individual Time Calculation (min): 45 min   Short Term Goals: Week 1:  PT Short Term Goal 1 (Week 1): Pt will perform least restrictive transfer with min A consistently PT Short Term Goal 2 (Week 1): Pt will ambulate x 50 ft with assist x 1 PT Short Term Goal 3 (Week 1): Pt will initiate stair training  Skilled Therapeutic Interventions/Progress Updates: Pt presented in w/c agreeable to therapy. Pt states pain 2-3/10 in L chest/ribs no intervention required. Pt transported to rehab gym total A for time management and performed stand pivot transfer to mat with RW and CGA. Participated in alternating toe taps to 4in step x 10 with RW and facilitation to engage R quads to maintain knee extension and minimize knee buckling. Performed STS without AD x 5 with LLE on 2 in step for increased recruitment of RLE with minA requiring to maintain balance as pt reaching for IV pole. Pt then transferred back to w/c via ambulatory transfer with RW with CGA overall. Pt ambulated 14f with RW with minA nearing CGA and w/c follow. Pt required intermittent cues to increased step length and to maintain TKE during stance phase. Pt transported remaining distance back to room and remained in w/c with belt alarm on, call bell within reach and needs met.      Therapy Documentation Precautions:  Precautions Precautions: Back,Fall Precaution Comments: reviewed precautions Restrictions Weight Bearing Restrictions: No General:   Vital Signs: Oxygen Therapy SpO2: 97 % O2 Device: Nasal Cannula O2 Flow Rate (L/min): 2 L/min   Therapy/Group: Individual Therapy  Gregory Bautista  Nealy Hickmon, PTA  01/18/2020, 12:31 PM

## 2020-01-18 NOTE — Progress Notes (Signed)
Occupational Therapy Session Note  Patient Details  Name: Gregory Bautista MRN: [id number] Date of Birth: 09-28-1940  Today's Date: 01/18/2020 OT Individual Time: 2297-9892 OT Individual Time Calculation (min): 55 min    Short Term Goals:  Skilled Therapeutic Interventions/Progress Updates:    Patient greeted semi-reclined in bed asleep, but easy to wake and agreeable to OT treatment session. Reviewed log roll technique for getting OOB to protect back. Pt needed min A to get from sidelying to sitting. Pt needed rest break, then completed sit<>stand w/ RW and min A after 2 trials. Pt able to pivot over to wc w/ RW and Min A. Bathing/dressing tasks completed at the sink with increased time and rest breaks during all tasks. Pt on 2L of O2 throughout session. Set-up A for UB and min A for LB bathing. Pt tolerated standing for 2 minutes while washing peri-area and buttocks. Grooming tasks completed from wc at the sink with set-up. Pt left seated in wc with alarm belt on at the sink finishing up shaving. Nurse tech informed of pt status.    Therapy Documentation Precautions:  Precautions Precautions: Back,Fall Precaution Comments: reviewed precautions Restrictions Weight Bearing Restrictions: No Pain:  Patient reports 3/10 back pain. Rest and repositioned for comfort.   Therapy/Group: Individual Therapy  Valma Cava 01/18/2020, 8:21 AM

## 2020-01-18 NOTE — Progress Notes (Signed)
Patient arrived back to unit, no complications noted at this time.  Audie Clear, LPN

## 2020-01-18 NOTE — Progress Notes (Signed)
Patient still eating dinner at this time. So bowel program scheduled for later shift. No complications noted at this time. Audie Clear, LPN

## 2020-01-18 NOTE — Progress Notes (Signed)
Patient here for a f/u visit with Dr. Sondra Come post 12/8 surgery.  Patient reports left anterior and posterior chest pain. Reports a small appetite. Reports drinking several glasses of water, juice and milk daily.  BP 103/67 (BP Location: Right Arm, Patient Position: Sitting, Cuff Size: Normal)   Pulse (!) 113   Temp 97.8 F (36.6 C) (Oral)   Resp 20   Ht 5\' 9"  (1.753 m)   Wt 158 lb (71.7 kg)   SpO2 100%   BMI 23.33 kg/m   Wt Readings from Last 3 Encounters:  01/12/20 156 lb 8.4 oz (71 kg)  01/18/20 158 lb (71.7 kg)  12/30/19 159 lb (72.1 kg)

## 2020-01-18 NOTE — Progress Notes (Signed)
Patient transported out for appointment at [facility] long via [place]. Reported off to [facility]) Patient has oxygen on @ 2Liters with fluids running through IV LFA. No s/s of distress noted at this time.  Audie Clear, LPN

## 2020-01-18 NOTE — Progress Notes (Signed)
Occupational Therapy Session Note  Patient Details  Name: Gregory Bautista MRN: [id number] Date of Birth: 1940/05/16  Today's Date: 01/18/2020 OT Individual Time: 1045-1130 OT Individual Time Calculation (min): 45 min  and Today's Date: 01/18/2020 OT Missed Time: 30 Minutes Missed Time Reason: Patient fatigue   Short Term Goals: Week 1:  OT Short Term Goal 1 (Week 1): Pt will be able to stand from EOB to Rw with CGA to prepare for LB self care. OT Short Term Goal 2 (Week 1): Pt will transfer to toilet with min A. OT Short Term Goal 3 (Week 1): Pt will complete toileting with min A. OT Short Term Goal 4 (Week 1): Pt will don pants with min a. OT Short Term Goal 5 (Week 1): Pt will be able to tolerate standing for 5 min with RW with CGA.  Skilled Therapeutic Interventions/Progress Updates:    Pt resting in recliner upon arrival. Pt commented that he was "pretty tired" but was doing ok. OT intervention with focus on functional stand pivot transfers, sit<>stand, and standing balance. Sit<>stand with min A and standing balance. Stand pivot transfers with min A and mod verbal cues for sequencing. Pt prefers to have BUE for support when transferring. Bed mobility with supervision. Pt commented that he was exhausted from earlier therapy sessions. Pt returned to bed and remained in bed with all needs within reach and bed alarm activated. Pt requested peanut butter crackers and apple juice.   Therapy Documentation Precautions:  Precautions Precautions: Back,Fall Precaution Comments: reviewed precautions Restrictions Weight Bearing Restrictions: No General: General OT Amount of Missed Time: 30 Minutes Pain:  Pt c/o 2/10 pain in L ribs; repositioned   Therapy/Group: Individual Therapy  Leroy Libman 01/18/2020, 12:17 PM

## 2020-01-18 NOTE — Progress Notes (Signed)
[place] PHYSICAL MEDICINE & REHABILITATION PROGRESS NOTE   Subjective/Complaints: Patient seen sitting up in his chair this morning.  He states he slept well overnight.  He is in good spirits.  ROS: + Chest pain (baseline), shortness of breath.  Denies N/V/D  Objective:   No results found. Recent Labs    01/18/20 0454  WBC 7.0  HGB 9.8*  HCT 31.6*  PLT 244   Recent Labs    01/18/20 0454  NA 134*  K 4.6  CL 99  CO2 28  GLUCOSE 112*  BUN 15  CREATININE 0.86  CALCIUM 10.2    Intake/Output Summary (Last 24 hours) at 01/18/2020 1223 Last data filed at 01/18/2020 0900 Gross per 24 hour  Intake 1840.84 ml  Output 2100 ml  Net -259.16 ml        Physical Exam: Vital Signs Blood pressure 101/61, pulse 94, temperature 97.8 F (36.6 C), temperature source Oral, resp. rate 19, height 5\' 9"  (1.753 m), weight 71 kg, SpO2 97 %. Constitutional: No distress . Vital signs reviewed. HENT: Normocephalic.  Atraumatic. Eyes: EOMI. No discharge. Cardiovascular: No JVD.  RRR. Respiratory: Normal effort.  No stridor.  Bilateral clear to auscultation.  + [place]. GI: Non-distended.  BS +. GU: + Foley Skin: Warm and dry.  Intact. Psych: Normal mood.  Normal behavior. Musc: No edema in extremities.  No tenderness in extremities. Neuro: Alert Motor: Bilateral upper extremities: Grossly 5/5 proximal distal Bilateral lower extremities: Grossly 4/5 proximal to distal  Assessment/Plan: 1. Functional deficits which require 3+ hours per day of interdisciplinary therapy in a comprehensive inpatient rehab setting.  Physiatrist is providing close team supervision and 24 hour management of active medical problems listed below.  Physiatrist and rehab team continue to assess barriers to discharge/monitor patient progress toward functional and medical goals  Care Tool:  Bathing    Body parts bathed by patient: Right upper leg,Left upper leg,Front perineal area,Abdomen,Chest,Left arm,Right  arm   Body parts bathed by helper: Buttocks,Right lower leg,Left lower leg     Bathing assist Assist Level: Moderate Assistance - Patient 50 - 74%     Upper Body Dressing/Undressing Upper body dressing   What is the patient wearing?: Pull over shirt    Upper body assist Assist Level: Supervision/Verbal cueing    Lower Body Dressing/Undressing Lower body dressing      What is the patient wearing?: Incontinence brief     Lower body assist Assist for lower body dressing: Moderate Assistance - Patient 50 - 74%     Toileting Toileting    Toileting assist Assist for toileting: Moderate Assistance - Patient 50 - 74% Assistive Device Comment: wheelchair   Transfers Chair/bed transfer  Transfers assist     Chair/bed transfer assist level: Moderate Assistance - Patient 50 - 74%     Locomotion Ambulation   Ambulation assist      Assist level: Contact Guard/Touching assist Assistive device: Walker-rolling Max distance: 45   Walk 10 feet activity   Assist     Assist level: Contact Guard/Touching assist Assistive device: Walker-rolling   Walk 50 feet activity   Assist Walk 50 feet with 2 turns activity did not occur: Safety/medical concerns  Assist level: 2 helpers Assistive device: Walker-rolling    Walk 150 feet activity   Assist Walk 150 feet activity did not occur: Safety/medical concerns         Walk 10 feet on uneven surface  activity   Assist Walk 10 feet on uneven surfaces  activity did not occur: Safety/medical concerns         Wheelchair     Assist Will patient use wheelchair at discharge?: Yes (Per PT long term goals) Type of Wheelchair: Manual    Wheelchair assist level: Supervision/Verbal cueing Max wheelchair distance: 100'    Wheelchair 50 feet with 2 turns activity    Assist        Assist Level: Supervision/Verbal cueing   Wheelchair 150 feet activity     Assist      Assist Level: Moderate  Assistance - Patient 50 - 74%   Blood pressure 101/61, pulse 94, temperature 97.8 F (36.6 C), temperature source Oral, resp. rate 19, height 5\' 9"  (1.753 m), weight 71 kg, SpO2 97 %.  Medical Problem List and Plan: 1.  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  Antithrombotics: -DVT/anticoagulation:   Heparin.  Mechanical: Sequential compression devices, below knee Bilateral lower extremities   Dopplers negative for DVT  -antiplatelet therapy: N/A 3. Pain Management: will continue Oxycodone prn.   Controlled on 12/27 4. Mood: LCSW to follow for evaluation and support.              -antipsychotic agents: N/a 5. Neuropsych: This patient is capable of making decisions on his own behalf. 6. Skin/Wound Care: Routine pressure relief measures.  7. Fluids/Electrolytes/Nutrition: Monitor I/Os.             Added juven to promote healing.  8. 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              Plans to remove Foley on Wednesday, however family evaluating consideration of continuing 9.  Acute renal failure: Resolved  Creatinine 0.86 on 12/27   Continue IVF 10. Acute blood loss anemia:   Hemoglobin 9.8 on 12/27, labs ordered for tomorrow 11. Metastatic Lung AdenoCA:  On dulera--has been on supplemental oxygen             --Plans for simulation on 12/27 with MD appt.  12.  Kleb/Proteus UTI: Completed course of Cipro 13. Resting tachycardia: 12/21 sinus tach RBBB on  EKG   Controlled on 12/27 14. 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 Hypotension-   Severe orthostasis, positive on 12/27  TEDs/abdominal binder for support.  15.  Sleep disturbance: Scheduled melatonin at nights.   Trazodone 100  Improving 16. Neurogenic bladder- has foley due to urinary rentention  Flomax DC'd due to  orthostasis 17. Tenting/mild dehydration- will push 6 cups/water daily minimum  Monitor in therapy  18. Hyperkalemia  Lokelma ordered on 12/23  Potassium 4.6 on 12/27   LOS: 6 days A FACE TO FACE EVALUATION WAS PERFORMED  Emila Steinhauser Lorie Phenix 01/18/2020, 12:23 PM

## 2020-01-18 NOTE — Progress Notes (Signed)
Pt finished his supper, agreed for bowel program. He was positioned on L side and dig stimulation done. Stool ( smear ) came out.Suppository administered. On next round dig stim, moderate amount of mushy stool. No more stool in rectum. Peri care done.Pt tolerated well.

## 2020-01-18 NOTE — Progress Notes (Signed)
Patient ID: Gregory Bautista, male   DOB: 1940-12-10, 79 y.o.   MRN: [id number]  SW scheduled pt transportation for appointment at 1:30pm for nursing evaluation at [facility] on [address]; and then with Dr. Sondra Come at [place] [phone]) for 1pm. SW spoke with Lisa/RN ([phone]) in Clinic who reported will schedule pt for return trip. SW updated medical team. SW provided packet for transportation to pt assigned RN.  SW called pt dtr [name] to inform on above with regard to arranged transportation, and asked her to follow-up with her uncle Boris since he will be accompanying him to appointment. SW met with pt in room to discuss above.   [name], MSW, [place] Office: [phone] Cell: [phone] Fax: [phone]

## 2020-01-18 NOTE — Progress Notes (Signed)
Patient is still in the hospital at this time and needs to be re-scheduled. I updated new patient coordinator to call and scheduler to him to be seen on 01/25/20.

## 2020-01-19 ENCOUNTER — Inpatient Hospital Stay (HOSPITAL_COMMUNITY): Payer: Medicare Other | Admitting: Occupational Therapy

## 2020-01-19 ENCOUNTER — Inpatient Hospital Stay (HOSPITAL_COMMUNITY): Payer: Medicare Other

## 2020-01-19 ENCOUNTER — Inpatient Hospital Stay (HOSPITAL_COMMUNITY): Payer: Medicare Other | Admitting: Physical Therapy

## 2020-01-19 LAB — CBC WITH DIFFERENTIAL/PLATELET
Abs Immature Granulocytes: 0.05 10*3/uL (ref 0.00–0.07)
Basophils Absolute: 0 10*3/uL (ref 0.0–0.1)
Basophils Relative: 0 %
Eosinophils Absolute: 0.2 10*3/uL (ref 0.0–0.5)
Eosinophils Relative: 3 %
HCT: 32.4 % — ABNORMAL LOW (ref 39.0–52.0)
Hemoglobin: 10.4 g/dL — ABNORMAL LOW (ref 13.0–17.0)
Immature Granulocytes: 1 %
Lymphocytes Relative: 28 %
Lymphs Abs: 2 10*3/uL (ref 0.7–4.0)
MCH: 29 pg (ref 26.0–34.0)
MCHC: 32.1 g/dL (ref 30.0–36.0)
MCV: 90.3 fL (ref 80.0–100.0)
Monocytes Absolute: 0.8 10*3/uL (ref 0.1–1.0)
Monocytes Relative: 11 %
Neutro Abs: 4.1 10*3/uL (ref 1.7–7.7)
Neutrophils Relative %: 57 %
Platelets: 248 10*3/uL (ref 150–400)
RBC: 3.59 MIL/uL — ABNORMAL LOW (ref 4.22–5.81)
RDW: 12.3 % (ref 11.5–15.5)
WBC: 7.2 10*3/uL (ref 4.0–10.5)
nRBC: 0 % (ref 0.0–0.2)

## 2020-01-19 MED ORDER — OXYCODONE HCL 5 MG PO TABS: 5.0000 mg | ORAL_TABLET | ORAL | Status: DC | PRN

## 2020-01-19 NOTE — Progress Notes (Signed)
PHYSICAL MEDICINE & REHABILITATION PROGRESS NOTE   Subjective/Complaints: No complaints this morning.  Left anterior and posterior chest wall pain and shortness of breath have improved.  Hgb trending upward. Sleeping well  ROS: + Chest pain (baseline), shortness of breath, improved.  Denies N/V/D  Objective:   No results found. Recent Labs    01/18/20 0454 01/19/20 0551  WBC 7.0 7.2  HGB 9.8* 10.4*  HCT 31.6* 32.4*  PLT 244 248   Recent Labs    01/18/20 0454  NA 134*  K 4.6  CL 99  CO2 28  GLUCOSE 112*  BUN 15  CREATININE 0.86  CALCIUM 10.2    Intake/Output Summary (Last 24 hours) at 01/19/2020 0837 Last data filed at 01/19/2020 0700 Gross per 24 hour  Intake 1060 ml  Output 3250 ml  Net -2190 ml        Physical Exam: Vital Signs Blood pressure (!) 93/59, pulse 98, temperature 98.4 F (36.9 C), temperature source Oral, resp. rate 16, height 5\' 9"  (1.753 m), weight 71 kg, SpO2 95 %. Gen: no distress, normal appearing HEENT: oral mucosa pink and moist, NCAT Cardio: Reg rate Respiratory: Normal effort.  No stridor.  Bilateral clear to auscultation.  + [place]. GI: Non-distended.  BS +. GU: + Foley Skin: Warm and dry.  Intact. Psych: Normal mood.  Normal behavior. Musc: No edema in extremities.  No tenderness in extremities. Neuro: Alert Motor: Bilateral upper extremities: Grossly 5/5 proximal distal Bilateral lower extremities: Grossly 4/5 proximal to distal  Assessment/Plan: 1. Functional deficits which require 3+ hours per day of interdisciplinary therapy in a comprehensive inpatient rehab setting.  Physiatrist is providing close team supervision and 24 hour management of active medical problems listed below.  Physiatrist and rehab team continue to assess barriers to discharge/monitor patient progress toward functional and medical goals  Care Tool:  Bathing    Body parts bathed by patient: Right upper leg,Left upper leg,Front perineal  area,Abdomen,Chest,Left arm,Right arm   Body parts bathed by helper: Buttocks,Right lower leg,Left lower leg     Bathing assist Assist Level: Moderate Assistance - Patient 50 - 74%     Upper Body Dressing/Undressing Upper body dressing   What is the patient wearing?: Pull over shirt    Upper body assist Assist Level: Supervision/Verbal cueing    Lower Body Dressing/Undressing Lower body dressing      What is the patient wearing?: Incontinence brief     Lower body assist Assist for lower body dressing: Moderate Assistance - Patient 50 - 74%     Toileting Toileting    Toileting assist Assist for toileting: Moderate Assistance - Patient 50 - 74% Assistive Device Comment: wheelchair   Transfers Chair/bed transfer  Transfers assist     Chair/bed transfer assist level: Moderate Assistance - Patient 50 - 74%     Locomotion Ambulation   Ambulation assist      Assist level: Contact Guard/Touching assist Assistive device: Walker-rolling Max distance: 45   Walk 10 feet activity   Assist     Assist level: Contact Guard/Touching assist Assistive device: Walker-rolling   Walk 50 feet activity   Assist Walk 50 feet with 2 turns activity did not occur: Safety/medical concerns  Assist level: 2 helpers Assistive device: Walker-rolling    Walk 150 feet activity   Assist Walk 150 feet activity did not occur: Safety/medical concerns         Walk 10 feet on uneven surface  activity   Assist Walk  10 feet on uneven surfaces activity did not occur: Safety/medical concerns         Wheelchair     Assist Will patient use wheelchair at discharge?: Yes (Per PT long term goals) Type of Wheelchair: Manual    Wheelchair assist level: Supervision/Verbal cueing Max wheelchair distance: 100'    Wheelchair 50 feet with 2 turns activity    Assist        Assist Level: Supervision/Verbal cueing   Wheelchair 150 feet activity     Assist       Assist Level: Moderate Assistance - Patient 50 - 74%   Blood pressure (!) 93/59, pulse 98, temperature 98.4 F (36.9 C), temperature source Oral, resp. rate 16, height 5\' 9"  (1.753 m), weight 71 kg, SpO2 95 %.  Medical Problem List and Plan: 1.  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  Antithrombotics: -DVT/anticoagulation:   Heparin.  Mechanical: Sequential compression devices, below knee Bilateral lower extremities   Dopplers negative for DVT  -antiplatelet therapy: N/A 3. Pain Management: will continue Oxycodone prn.   Controlled 12/28, decrease oxycodone to q4H prn. 4. Mood: LCSW to follow for evaluation and support.              -antipsychotic agents: N/a 5. Neuropsych: This patient is capable of making decisions on his own behalf. 6. Skin/Wound Care: Routine pressure relief measures.  7. Fluids/Electrolytes/Nutrition: Monitor I/Os.             Added juven to promote healing.  8. 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              Plans to remove Foley on Wednesday, however family evaluating consideration of continuing 9.  Acute renal failure: Resolved  Creatinine 0.86 on 12/27   Continue IVF 10. Acute blood loss anemia:   Hemoglobin 9.8 on 12/27, hgb up to 10.4, monitor weekly 11. Metastatic Lung AdenoCA:  On dulera--has been on supplemental oxygen             --Plans for simulation on 12/27 with MD appt.  12.  Kleb/Proteus UTI: Completed course of Cipro 13. Resting tachycardia: 12/21 sinus tach RBBB on  EKG   Elevated to 98 on 12/28: continue to monitor. 14. 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 Hypotension-   Severe orthostasis, positive on 12/27  TEDs/abdominal binder for support.  15.  Sleep disturbance: Scheduled  melatonin at nights.   Trazodone 100  Improving 16. Neurogenic bladder- has foley due to urinary rentention  Flomax DC'd due to orthostasis 17. Tenting/mild dehydration- will push 6 cups/water daily minimum  Monitor in therapy  18. Hyperkalemia  Lokelma ordered on 12/23  Potassium 4.6 on 12/27 19. Pancoast tumor/ metastatic lung cancer: radiation to begin next week    LOS: 7 days A FACE TO FACE EVALUATION WAS PERFORMED  Clide Deutscher Carmela Piechowski 01/19/2020, 8:37 AM

## 2020-01-19 NOTE — Progress Notes (Signed)
Physical Therapy Session Note  Patient Details  Name: Gregory Bautista MRN: [id number] Date of Birth: [birth year]  Today's Date: 01/19/2020 PT Individual Time: 1400-1515 PT Individual Time Calculation (min): 75 min   Short Term Goals: Week 1:  PT Short Term Goal 1 (Week 1): Pt will perform least restrictive transfer with min A consistently PT Short Term Goal 2 (Week 1): Pt will ambulate x 50 ft with assist x 1 PT Short Term Goal 3 (Week 1): Pt will initiate stair training  Skilled Therapeutic Interventions/Progress Updates:    Pt received seated in w/c in room, agreeable to PT session. Pt reports ongoing L chest pain described as burning and upper back pain, declines intervention this session. Sit to stand with min A to RW. Ambulation x 120', x 100' with RW and min A for balance, cues for wider BOS during gait. Pt exhibits varying step length with impaired LE control during gait, decreased control noted with onset of fatigue. Standing alt L/R cone taps with RW and min A for balance for LE coordination training. Pt then reports urge to use the bathroom. Toilet transfer with RW and min A with use of elevated BSC over toilet. Pt has some incontinence of BM in brief. Pt is setup A for pericare in standing, dependent to don new brief and total A to pull pants up over hips. Stand pivot transfer back to w/c then back to bed with RW and min A. Sit to supine min A for LLE management. Pt left semi-reclined in bed with needs in reach, bed alarm in place at end of session. Pt on 2L O2 throughout session, SpO2 remains at 89% and higher with activity.  Therapy Documentation Precautions:  Precautions Precautions: Back,Fall Precaution Comments: reviewed precautions Restrictions Weight Bearing Restrictions: No   Therapy/Group: Individual Therapy   Excell Seltzer, PT, DPT  01/19/2020, 4:53 PM

## 2020-01-19 NOTE — Progress Notes (Signed)
Contacted patient's daughter Soundra Pilon (with brother Boris) to inform them that patient has appointment with Dr. Earlie Server next Monday at 2:15 pm. Also asked brother to be present for appt so as to have additional person to gather information. Both were very upset about lack of communication between treating providers, lack of updates to Dr. Warren Danes and had multiple questions about making sure that patient has been cleared to start XRT next week as well as reasons for not starting chemo.  I did inform them that XRT usually begins 2 weeks post op and prior to chemo but final decisions per Hem/onc. They plan on talking with Dr. Arnoldo Morale regarding appropriateness of current regimen.

## 2020-01-19 NOTE — Progress Notes (Signed)
Bowel program completed.Dig stimulation done, medium amount of mushy , light brown stool. No more stool in rectum. Peri care done. Pt tolerated well.

## 2020-01-19 NOTE — Progress Notes (Signed)
Bowel program completed, awaiting results. No complications noted at this time.  Audie Clear, LPN

## 2020-01-19 NOTE — Progress Notes (Signed)
Occupational Therapy Session Note  Patient Details  Name: Gregory Bautista MRN: [id number] Date of Birth: January 29, 1940  Today's Date: 01/19/2020 OT Individual Time: 0900-1000 OT Individual Time Calculation (min): 60 min    Short Term Goals: Week 1:  OT Short Term Goal 1 (Week 1): Pt will be able to stand from EOB to Rw with CGA to prepare for LB self care. OT Short Term Goal 2 (Week 1): Pt will transfer to toilet with min A. OT Short Term Goal 3 (Week 1): Pt will complete toileting with min A. OT Short Term Goal 4 (Week 1): Pt will don pants with min a. OT Short Term Goal 5 (Week 1): Pt will be able to tolerate standing for 5 min with RW with CGA.  Skilled Therapeutic Interventions/Progress Updates:    Pt resting in bed upon arrival and agreeable to getting OOB. Supine>sit EOB with supervision using bed rails and HOB elevated. Stand pivot transfer with min A and max verbal cues for sequencing and technique. Pt completed bathing/dressing with sit<>stand from w/c at sink. Sit<>stand from w/c with min A and CGA for standing balance. Pt required max A for LB dressing with threading BLE into paper scrubs. Pt able to pull pants over hips without assistance except CGA for standing. Pt requires more then a reasonable amount of time. Pt c/o "burning" in L upper chest after activity. Pt remained seated in w/c with all needs within reach and belt alarm activated.   Therapy Documentation Precautions:  Precautions Precautions: Back,Fall Precaution Comments: reviewed precautions Restrictions Weight Bearing Restrictions: No  Pain:  Pt c/o 2/10 pain in upper L chest. Pt c/o "burning" in L chest after activity. RN aware   Therapy/Group: Individual Therapy  Leroy Libman 01/19/2020, 11:12 AM

## 2020-01-19 NOTE — Progress Notes (Addendum)
Patient ID: Gregory Bautista, male   DOB: 25-Apr-1940, 79 y.o.   MRN: [id number]  Spoke with toni-daughter to discuss team conference goals supervision overall and target discharge date of 1/12. She is aware of the upcoming radiation treatments, wanted to know exactly when would start. She and other family members to have a family meeting to discuss what each of them can do for pt and will get back with this worker and talk with pt. Continue to work on a safe discharge plan for pt. Pt is aware of the plans of family

## 2020-01-19 NOTE — Patient Care Conference (Signed)
Inpatient RehabilitationTeam Conference and Plan of Care Update Date: 01/19/2020   Time: 11:06 AM    Patient Name: Gregory Bautista      Medical Record Number: [id number]  Date of Birth: 1940-04-04 Sex: Male         Room/Bed: 4M01C/4M01C-01 Payor Info: Payor: [employer] MEDICARE / Plan: [facility] MEDICARE / Product Type: *No Product type* /    Admit Date/Time:  01/12/2020  3:14 PM  Primary Diagnosis:  Thoracic myelopathy  Hospital Problems: Principal Problem:   Thoracic myelopathy Active Problems:   Hyperkalemia   Neurogenic bladder   Sleep disturbance   Postoperative pain    Expected Discharge Date: Expected Discharge Date: 02/03/20  Team Members Present: Physician leading conference: Dr. Leeroy Cha Care Coodinator Present: Dorthula Nettles, RN, BSN, CRRN;Becky Dupree, LCSW Nurse Present: Judee Clara, LPN PT Present: Excell Seltzer, PT OT Present: Roanna Epley, COTA;Jennifer Tamala Julian, OT PPS Coordinator present : Ileana Ladd, Burna Mortimer, SLP     Current Status/Progress Goal Weekly Team Focus  Bowel/Bladder   Bowel program, foley  regain B/B function  daily bowel program, foley care   Swallow/Nutrition/ Hydration             ADL's   bathing-mod A; UB dressing-min A; LB dressing-mod A; toilet transfers and toleting-mod A; limited endurance/activity tolerance  supervision overall  activity tolerance, BADL training, functional transfers, education   Mobility   minA bed mobility, minA from lower surfaces CGA from slightly elevated surface STS, minA nearing CGA stand pivot transfer with RW, gait ~40ft with RW and CGA but w/c follow due to R knee instability, initiated stair training with 3in steps with modA and B rails  supervision overall  bed mobility, transfers, gait, BLE strengthening, balance,d/c planning   Communication             Safety/Cognition/ Behavioral Observations            Pain   no pain  pain<3  assess pain q shift and prn   Skin   upper  back incision  proper healing, no infection  assess skin  Q shift     Discharge Planning:  Pt lives alone and has a brother that lives locally but he is unable to provide physical support pt will require at d/c. Pt is likely to be SNF placement, if he does not d/c to his daughter's home in [place].   Team Discussion: Will start radiation therapy early next week, patient is on 2L, O2. Foley in place, patient on evening bowel program and documentation states going well. Complains of upper left chest pain anterior and posterior. PT reports patient has supervision goals, is min assist with RW, mod assist with stairs. OT reports patient has supervision goals, bathing and dressing is done at the sink level. Patient is mod assist for lower body. Limited by fatigue and endurance. Patient on target to meet rehab goals: yes, with continued therapy  *See Care Plan and progress notes for long and short-term goals.   Revisions to Treatment Plan:  Radiation therapy to begin next week Increase endurance Decrease fatigue  Teaching Needs: Family education Bowel program Foley care Wound care Oxygen therapy  Current Barriers to Discharge: Decreased caregiver support, Medical stability, Home enviroment access/layout, Neurogenic bowel and bladder, Wound care, Pending chemo/radiation, Behavior and New oxygen  Possible Resolutions to Barriers: Continue current medications, continue bowel and bladder program, continue wound care, provide emotional support to patient and family.     Medical Summary Current Status:  Moderate mushy stool, good results with bowel program; c/o left anterior and posterior chest pain- improved, SOB improved, anemia  Barriers to Discharge: Medical stability  Barriers to Discharge Comments: Neurogenic bowel and bladder, left anterior and posterior chest pain, anemia, thoracic and lung masses Possible Resolutions to Barriers/Weekly Focus: continue bowel and bladder program,  contiue to monitor for pain and shortness of breath, continue weekly hgb monitoring, plan for radiati0on to start next week   Continued Need for Acute Rehabilitation Level of Care: The patient requires daily medical management by a physician with specialized training in physical medicine and rehabilitation for the following reasons: Direction of a multidisciplinary physical rehabilitation program to maximize functional independence : Yes Medical management of patient stability for increased activity during participation in an intensive rehabilitation regime.: Yes Analysis of laboratory values and/or radiology reports with any subsequent need for medication adjustment and/or medical intervention. : Yes   I attest that I was present, lead the team conference, and concur with the assessment and plan of the team.   Cristi Loron 01/19/2020, 4:57 PM

## 2020-01-19 NOTE — Progress Notes (Signed)
Occupational Therapy Session Note  Patient Details  Name: Gregory Bautista MRN: [id number] Date of Birth: 1940/05/13  Today's Date: 01/19/2020 OT Individual Time: 1105-1200 OT Individual Time Calculation (min): 55 min    Short Term Goals: Week 1:  OT Short Term Goal 1 (Week 1): Pt will be able to stand from EOB to Rw with CGA to prepare for LB self care. OT Short Term Goal 2 (Week 1): Pt will transfer to toilet with min A. OT Short Term Goal 3 (Week 1): Pt will complete toileting with min A. OT Short Term Goal 4 (Week 1): Pt will don pants with min a. OT Short Term Goal 5 (Week 1): Pt will be able to tolerate standing for 5 min with RW with CGA.  Skilled Therapeutic Interventions/Progress Updates:    Pt received in wc connected to IV. Brought exercise equipment to the room to avoid transporting pt with O2 tank and Iv pole.  Pt worked on light UE exercises with caution to adhere to back precautions and avoid pulling on upper neck.  Pt used 2 lb dowel bar, small exercise ball and level 1 theraband to work on UE strength focusing on shoulders, triceps and low back. Pt completed each exercise 15 x, rested 20 -30 sec and then did 3 full set of each exercise; chest press, bicep curls, tricep ext, PNF d1, d2.   Pt did well but did seem fatigued.  Resting in wc with all needs met and belt alarm on.     Therapy Documentation Precautions:  Precautions Precautions: Back,Fall Precaution Comments: reviewed precautions Restrictions Weight Bearing Restrictions: No      Pain: Pain Assessment Pain Score: 0-No pain ADL: ADL Eating: Set up Grooming: Setup Where Assessed-Grooming: Sitting at sink Upper Body Bathing: Supervision/safety Where Assessed-Upper Body Bathing: Sitting at sink Lower Body Bathing: Moderate assistance Where Assessed-Lower Body Bathing: Standing at sink,Sitting at sink Upper Body Dressing: Supervision/safety Where Assessed-Upper Body Dressing: Sitting at sink Lower  Body Dressing: Moderate assistance Where Assessed-Lower Body Dressing: Wheelchair Toileting: Moderate assistance Where Assessed-Toileting: Glass blower/designer: Minimal Print production planner Method: Stand Ecologist: Raised toilet seat   Therapy/Group: Individual Therapy  [place] 01/19/2020, 12:12 PM

## 2020-01-20 ENCOUNTER — Inpatient Hospital Stay (HOSPITAL_COMMUNITY): Payer: Medicare Other

## 2020-01-20 ENCOUNTER — Inpatient Hospital Stay (HOSPITAL_COMMUNITY): Payer: Medicare Other | Admitting: Physical Therapy

## 2020-01-20 MED ORDER — DOCUSATE SODIUM 100 MG PO CAPS
200.0000 mg | ORAL_CAPSULE | Freq: Every day | ORAL | Status: DC
  Administered 2020-01-21 – 2020-01-22 (×2): 200 mg via ORAL
  Filled 2020-01-20 (×2): qty 2

## 2020-01-20 NOTE — Progress Notes (Signed)
Physical Therapy Weekly Progress Note  Patient Details  Name: Gregory Bautista MRN: [id number] Date of Birth: 1940-07-26  Beginning of progress report period: January 13, 2020 End of progress report period: January 20, 2020  Today's Date: 01/20/2020 PT Individual Time: 0932-3557 PT Individual Time Calculation (min): 47 min  pt missed 10 time of therapy 2/2 just getting lunch tray and requesting to eat quickly prior to therapy.    Patient has met 2 of 3 short term goals.  Pt continues to make progress toward goals however somewhat limited 2/2 pain and fatigue but remains motivated to improve functionally. Pt has been unsafe for attempting stair training at this time and will continue to progress toward this goal and has made progress toward all other goals.  Patient continues to demonstrate the following deficits muscle weakness, decreased cardiorespiratoy endurance and decreased oxygen support and decreased sitting balance, decreased standing balance and decreased balance strategies and therefore will continue to benefit from skilled PT intervention to increase functional independence with mobility.  Patient progressing toward long term goals..  Continue plan of care.  PT Short Term Goals Week 1:  PT Short Term Goal 1 (Week 1): Pt will perform least restrictive transfer with min A consistently PT Short Term Goal 1 - Progress (Week 1): Met PT Short Term Goal 2 (Week 1): Pt will ambulate x 50 ft with assist x 1 PT Short Term Goal 2 - Progress (Week 1): Met PT Short Term Goal 3 (Week 1): Pt will initiate stair training PT Short Term Goal 3 - Progress (Week 1): Progressing toward goal Week 2:  PT Short Term Goal 1 (Week 2): pt to demonstrate ambulation with LRAD for 150' CGA PT Short Term Goal 2 (Week 2): pt to demonstrate functional transfers with LRAD at [facility] consistently PT Short Term Goal 3 (Week 2): pt to demonstrate dynamic standing balance at CGA with LRAD PT Short Term Goal 4  (Week 2): initiate stair training  Skilled Therapeutic Interventions/Progress Updates:    pt received in bed and agreeable to therapy. Pt reported pain L side chest "burning" sensation, reported he felt the "patches" only somewhat relieved pain, pt did not rank formally but agreeable to therapy. Pt reported he felt that he was able to rest after first session this AM and reported he felt he needed longer rest breaks between therapy when able. Pt directed in supine>sit min A with Rolling walker. pt directed in gait training with Rolling walker for 90' at [facility] with VC for trunk extension, breathing technique and increased step height bilaterally for improved clearance. Pt directed in x5 Sit to stand from EOB to Rolling walker CGA with extra time  To complete and VC for hand placement. Pt directed in standing BLE strengthening exercises 2x10 marching and 2x10 hip abduction and adduction. Pt directed in Stand pivot transfer with Rolling walker at min A to recliner. All needs in reach and in good condition. Call light in hand, alarm set.  Pt reported worsening burning at end of session 5/10 in same location. Nursing made aware.   Therapy Documentation Precautions:  Precautions Precautions: Back,Fall Precaution Comments: reviewed precautions Restrictions Weight Bearing Restrictions: No General:   Vital Signs: Therapy Vitals Temp: 97.9 F (36.6 C) Pulse Rate: 95 Resp: 16 BP: 99/72 Patient Position (if appropriate): Sitting Oxygen Therapy SpO2: 99 % O2 Device: Room Air Pain:   Vision/Perception     Mobility:   Locomotion :    Trunk/Postural Assessment :    Balance:  Exercises:   Other Treatments:     Therapy/Group: Individual Therapy  [name] 01/20/2020, 3:25 PM

## 2020-01-20 NOTE — Progress Notes (Signed)
Pt declining wearing PRAFO boots

## 2020-01-20 NOTE — Progress Notes (Signed)
Bowel program completed, successful results. No complications noted at this time. Audie Clear, LPN

## 2020-01-20 NOTE — Progress Notes (Signed)
Occupational Therapy Session Note  Patient Details  Name: Gregory Bautista MRN: [id number] Date of Birth: 1940-12-25  Today's Date: 01/20/2020 OT Individual Time: 0700-0810 OT Individual Time Calculation (min): 70 min    Short Term Goals: Week 2:  OT Short Term Goal 1 (Week 2): Pt will be able to stand from EOB to Rw with CGA to prepare for LB self care. OT Short Term Goal 2 (Week 2): Pt will transfer to toilet with min A. OT Short Term Goal 3 (Week 2): Pt will complete toileting with min A. OT Short Term Goal 4 (Week 2): Pt will don pants with min a. OT Short Term Goal 5 (Week 2): Pt will be able to tolerate standing for 5 min with RW with CGA.  Skilled Therapeutic Interventions/Progress Updates:    Pt eating breakfast in bed upon arrival. Pt willing to "give it a try" this morning in regards to therapy. After finishing breakfast, pt sat EOB with supervision using bed functions. Sitting balance EOB with supervision. Stand pivot transfer to w/c with min A. Sit<>stand from w/c with min A. Pt able to tolerate standing for approx 1 min with RW. Pt engaged in bathing tasks at sink but did not have time to complete dressing tasks. Pt requires more then a reasonable amount of time to complete tasks. Sit<>stand X 4. Pt agreeable to completing dressing tasks during 2nd OT session. Pt agreeable to remaining in w/c with all needs within reach. Belt alarm activated.   Therapy Documentation Precautions:  Precautions Precautions: Back,Fall Precaution Comments: reviewed precautions Restrictions Weight Bearing Restrictions: No   Pain:  Pt c/o 2/10 pain in L upper chest ("burning"); activity and repositioned     Therapy/Group: Individual Therapy  Leroy Libman 01/20/2020, 9:57 AM

## 2020-01-20 NOTE — Progress Notes (Signed)
[place] PHYSICAL MEDICINE & REHABILITATION PROGRESS NOTE   Subjective/Complaints:  Patches help the chest/rib pain somewhat.  LBM yesterday with bowel program- but it's hard. Asking to increase colace.   Brother still wants to talk to someone. Has questions.   ROS:  Pt denies SOB, abd pain, CP, N/V/C/D, and vision changes   Objective:   No results found. Recent Labs    01/18/20 0454 01/19/20 0551  WBC 7.0 7.2  HGB 9.8* 10.4*  HCT 31.6* 32.4*  PLT 244 248   Recent Labs    01/18/20 0454  NA 134*  K 4.6  CL 99  CO2 28  GLUCOSE 112*  BUN 15  CREATININE 0.86  CALCIUM 10.2    Intake/Output Summary (Last 24 hours) at 01/20/2020 0846 Last data filed at 01/20/2020 0745 Gross per 24 hour  Intake 180 ml  Output 1800 ml  Net -1620 ml        Physical Exam: Vital Signs Blood pressure 103/63, pulse (!) 102, temperature 98.6 F (37 C), resp. rate 16, height 5\' 9"  (1.753 m), weight 71 kg, SpO2 100 %. Gen: pt sitting up in bed- wearing O2 by [place] 2L, NAD HEENT: oral mucosa pink and moist, NCAT Cardio: HR borderline tachycardia - regular rhythm Respiratory: CTA B/L- no W/R/R- good air movement- O2 by [place] GI: Soft, NT, ND, (+)BS  GU: + Foley- will d/w family again Skin: Warm and dry.  Intact. Psych: quiet, poor memory.  Musc: No edema in extremities.  No tenderness in extremities. Neuro: Alert Motor: Bilateral upper extremities: Grossly 5/5 proximal distal Bilateral lower extremities: Grossly 4/5 proximal to distal  Assessment/Plan: 1. Functional deficits which require 3+ hours per day of interdisciplinary therapy in a comprehensive inpatient rehab setting.  Physiatrist is providing close team supervision and 24 hour management of active medical problems listed below.  Physiatrist and rehab team continue to assess barriers to discharge/monitor patient progress toward functional and medical goals  Care Tool:  Bathing    Body parts bathed by patient: Right  arm,Left arm,Chest,Abdomen,Right upper leg,Left upper leg,Face   Body parts bathed by helper: Buttocks,Right lower leg,Left lower leg Body parts n/a: Front perineal area,Buttocks,Right lower leg,Left lower leg   Bathing assist Assist Level: Moderate Assistance - Patient 50 - 74%     Upper Body Dressing/Undressing Upper body dressing   What is the patient wearing?: Pull over shirt    Upper body assist Assist Level: Supervision/Verbal cueing    Lower Body Dressing/Undressing Lower body dressing      What is the patient wearing?: Incontinence brief,Pants     Lower body assist Assist for lower body dressing: Moderate Assistance - Patient 50 - 74%     Toileting Toileting    Toileting assist Assist for toileting: Moderate Assistance - Patient 50 - 74% Assistive Device Comment: wheelchair   Transfers Chair/bed transfer  Transfers assist     Chair/bed transfer assist level: Minimal Assistance - Patient > 75%     Locomotion Ambulation   Ambulation assist      Assist level: Minimal Assistance - Patient > 75% Assistive device: Walker-rolling Max distance: 120'   Walk 10 feet activity   Assist     Assist level: Minimal Assistance - Patient > 75% Assistive device: Walker-rolling   Walk 50 feet activity   Assist Walk 50 feet with 2 turns activity did not occur: Safety/medical concerns  Assist level: Minimal Assistance - Patient > 75% Assistive device: Walker-rolling    Walk 150 feet  activity   Assist Walk 150 feet activity did not occur: Safety/medical concerns         Walk 10 feet on uneven surface  activity   Assist Walk 10 feet on uneven surfaces activity did not occur: Safety/medical concerns         Wheelchair     Assist Will patient use wheelchair at discharge?: Yes (Per PT long term goals) Type of Wheelchair: Manual    Wheelchair assist level: Supervision/Verbal cueing Max wheelchair distance: 100'    Wheelchair 50 feet  with 2 turns activity    Assist        Assist Level: Supervision/Verbal cueing   Wheelchair 150 feet activity     Assist      Assist Level: Moderate Assistance - Patient 50 - 74%   Blood pressure 103/63, pulse (!) 102, temperature 98.6 F (37 C), resp. rate 16, height 5\' 9"  (1.753 m), weight 71 kg, SpO2 100 %.  Medical Problem List and Plan: 1.  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  Antithrombotics: -DVT/anticoagulation:   Heparin.  Mechanical: Sequential compression devices, below knee Bilateral lower extremities   Dopplers negative for DVT  -antiplatelet therapy: N/A 3. Pain Management: will continue Oxycodone prn.   Controlled 12/28, decrease oxycodone to q4H prn. 4. Mood: LCSW to follow for evaluation and support.              -antipsychotic agents: N/a 5. Neuropsych: This patient is capable of making decisions on his own behalf. 6. Skin/Wound Care: Routine pressure relief measures.  7. Fluids/Electrolytes/Nutrition: Monitor I/Os.             Added juven to promote healing.  8. 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              Plans to remove Foley on Wednesday, however family evaluating consideration of continuing  12/29- will need to discuss further 9.  Acute renal failure: Resolved  Creatinine 0.86 on 12/27   Continue IVF 10. Acute blood loss anemia:   Hemoglobin 9.8 on 12/27, hgb up to 10.4, monitor weekly 11. Metastatic Lung AdenoCA:  On dulera--has been on supplemental oxygen             --Plans for simulation on 12/27 with MD appt.   12/29- has appointment on 12/27- however will need to see what the plan is- it appears [facility]U had different ideas o order than Oncology does.  12.  Kleb/Proteus UTI: Completed course of Cipro 13. Resting tachycardia: 12/21 sinus  tach RBBB on  EKG   Elevated to 98 on 12/28: continue to monitor. 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   14. Hypotension-   Severe orthostasis, positive on 12/27  TEDs/abdominal binder for support.  15.  Sleep disturbance: Scheduled melatonin at nights.   Trazodone 100  Improving 16. Neurogenic bladder- has foley due to urinary rentention  Flomax DC'd due to orthostasis 17. Tenting/mild dehydration- will push 6 cups/water daily minimum  Monitor in therapy  18. Hyperkalemia  Lokelma ordered on 12/23  Potassium 4.6 on 12/27 19. Pancoast tumor/ metastatic lung cancer: radiation to begin next week  20. Dispo - d/c 1/12 so far.  LOS: 8 days A FACE TO FACE EVALUATION WAS PERFORMED  Ilyse Tremain 01/20/2020, 8:46 AM

## 2020-01-20 NOTE — Progress Notes (Signed)
Occupational Therapy Weekly Progress Note  Patient Details  Name: Gregory Bautista MRN: [id number] Date of Birth: 09/12/1940  Beginning of progress report period: January 13, 2020 End of progress report period: January 20, 2020  Patient has met 0 of 5 short term goals.  Pt progress has been slow during the past week. Pt fatigues quickly and requires multiple rest breaks during sessions. Pt experiencing increase "burning" sensation in upper L chest with activity, requiring pt to stop and rest when performing functional tasks. Pt requires min A/mod A for stand pivot transfers with mod verbal cues for techniques/sequencing. Pt requires min A for standing balance for LB self care, mod A for LB dressing, and mod A for toileting tasks.   Patient continues to demonstrate the following deficits: muscle weakness, decreased cardiorespiratoy endurance and decreased oxygen support, unbalanced muscle activation and decreased standing balance and decreased balance strategies and therefore will continue to benefit from skilled OT intervention to enhance overall performance with BADL, iADL and Reduce care partner burden.  Patient progressing toward long term goals..  Continue plan of care.  OT Short Term Goals Week 1:  OT Short Term Goal 1 (Week 1): Pt will be able to stand from EOB to Rw with CGA to prepare for LB self care. OT Short Term Goal 1 - Progress (Week 1): Progressing toward goal OT Short Term Goal 2 (Week 1): Pt will transfer to toilet with min A. OT Short Term Goal 2 - Progress (Week 1): Progressing toward goal OT Short Term Goal 3 (Week 1): Pt will complete toileting with min A. OT Short Term Goal 3 - Progress (Week 1): Progressing toward goal OT Short Term Goal 4 (Week 1): Pt will don pants with min a. OT Short Term Goal 4 - Progress (Week 1): Progressing toward goal OT Short Term Goal 5 (Week 1): Pt will be able to tolerate standing for 5 min with RW with CGA. OT Short Term Goal 5 -  Progress (Week 1): Progressing toward goal Week 2:  OT Short Term Goal 1 (Week 2): Pt will be able to stand from EOB to Rw with CGA to prepare for LB self care. OT Short Term Goal 2 (Week 2): Pt will transfer to toilet with min A. OT Short Term Goal 3 (Week 2): Pt will complete toileting with min A. OT Short Term Goal 4 (Week 2): Pt will don pants with min a. OT Short Term Goal 5 (Week 2): Pt will be able to tolerate standing for 5 min with RW with CGA.      Leotis Shames [facility] 01/20/2020, 6:31 AM

## 2020-01-20 NOTE — Progress Notes (Signed)
Occupational Therapy Session Note  Patient Details  Name: Gregory Bautista MRN: [id number] Date of Birth: [birth year]  Today's Date: 01/20/2020 OT Individual Time: 1000-1015 OT Individual Time Calculation (min): 15 min  and Today's Date: 01/20/2020 OT Missed Time: 8 Minutes Missed Time Reason: Pain   Short Term Goals: Week 2:  OT Short Term Goal 1 (Week 2): Pt will be able to stand from EOB to Rw with CGA to prepare for LB self care. OT Short Term Goal 2 (Week 2): Pt will transfer to toilet with min A. OT Short Term Goal 3 (Week 2): Pt will complete toileting with min A. OT Short Term Goal 4 (Week 2): Pt will don pants with min a. OT Short Term Goal 5 (Week 2): Pt will be able to tolerate standing for 5 min with RW with CGA.  Skilled Therapeutic Interventions/Progress Updates:    Pt resting in bed upon arrival with O2 nasal canula to side of nares,  replaced, O2 sats 90% on 2L O2. Attempted to engage pt in bed mobility tasks but c/o increased pain with activity. Pt missed 45 mins skilled OT services secondary to increased pain.  Therapy Documentation Precautions:  Precautions Precautions: Back,Fall Precaution Comments: reviewed precautions Restrictions Weight Bearing Restrictions: No General: General OT Amount of Missed Time: 45 Minutes   Pain:  Pt c/o 8/10 pain ("burning") L upper chest; L upper lateral trunk, and L axillary; repositioned, RN aware   Therapy/Group: Individual Therapy  Leroy Libman 01/20/2020, 10:47 AM

## 2020-01-20 NOTE — Progress Notes (Signed)
Patient ID: Gregory Bautista, male   DOB: 04/27/1940, 79 y.o.   MRN: [id number] Attempted to make [facility] transport for rad tx next week and was told too soon wait until Friday.

## 2020-01-21 ENCOUNTER — Inpatient Hospital Stay (HOSPITAL_COMMUNITY): Payer: Medicare Other | Admitting: Occupational Therapy

## 2020-01-21 ENCOUNTER — Inpatient Hospital Stay (HOSPITAL_COMMUNITY): Payer: Medicare Other | Admitting: Physical Therapy

## 2020-01-21 ENCOUNTER — Encounter (HOSPITAL_COMMUNITY): Payer: Self-pay

## 2020-01-21 MED ORDER — DULOXETINE HCL 30 MG PO CPEP
30.0000 mg | ORAL_CAPSULE | Freq: Every day | ORAL | Status: DC
  Administered 2020-01-21 – 2020-01-24 (×4): 30 mg via ORAL
  Filled 2020-01-21 (×4): qty 1

## 2020-01-21 MED ORDER — GABAPENTIN 300 MG PO CAPS: 300.0000 mg | ORAL_CAPSULE | Freq: Three times a day (TID) | ORAL | Status: DC

## 2020-01-21 MED ORDER — TAMSULOSIN HCL 0.4 MG PO CAPS
0.4000 mg | ORAL_CAPSULE | Freq: Every day | ORAL | Status: DC
  Administered 2020-01-21 – 2020-02-06 (×15): 0.4 mg via ORAL
  Filled 2020-01-21 (×17): qty 1

## 2020-01-21 MED ORDER — LIDOCAINE 5 % EX PTCH
3.0000 | MEDICATED_PATCH | Freq: Every day | CUTANEOUS | Status: DC
  Administered 2020-01-22 – 2020-02-15 (×15): 3 via TRANSDERMAL
  Filled 2020-01-21 (×19): qty 3

## 2020-01-21 MED ORDER — FLUDROCORTISONE ACETATE 0.1 MG PO TABS
0.1000 mg | ORAL_TABLET | Freq: Every day | ORAL | Status: DC
  Administered 2020-01-21 – 2020-01-29 (×9): 0.1 mg via ORAL
  Filled 2020-01-21 (×10): qty 1

## 2020-01-21 MED ORDER — LIDOCAINE HCL URETHRAL/MUCOSAL 2 % EX GEL
1.0000 "application " | Freq: Every day | CUTANEOUS | Status: DC
  Administered 2020-01-21 – 2020-02-14 (×13): 1
  Filled 2020-01-21 (×13): qty 5

## 2020-01-21 NOTE — Progress Notes (Signed)
[place] PHYSICAL MEDICINE & REHABILITATION PROGRESS NOTE   Subjective/Complaints:  Nerve burning pain in L chest still bothering him pretty badly this AM.   So intense cannot eat.  Slept well- food coming back up.   Discussed Foley 50/50 chance could void.  Will try and restart Flomax and add Florinef to make his BP go up.     ROS:   Pt denies SOB, abd pain, CP, N/V/C/D, and vision changes  Objective:   No results found. Recent Labs    01/19/20 0551  WBC 7.2  HGB 10.4*  HCT 32.4*  PLT 248   No results for input(s): NA, K, CL, CO2, GLUCOSE, BUN, CREATININE, CALCIUM in the last 72 hours.  Intake/Output Summary (Last 24 hours) at 01/21/2020 1018 Last data filed at 01/21/2020 0850 Gross per 24 hour  Intake 240 ml  Output 525 ml  Net -285 ml        Physical Exam: Vital Signs Blood pressure 128/68, pulse (!) 105, temperature 98.6 F (37 C), temperature source Oral, resp. rate 19, height 5\' 9"  (1.753 m), weight 71 kg, SpO2 99 %. Gen:pt sitting up in bed- hasn't eaten breakfast, O2 via [place], NAD HEENT: oral mucosa pink and moist, NCAT Cardio: tachycardic 100s; reg rhythm Respiratory: CTA B/L- no W/R/R- good air movement GI: Soft, NT, ND, (+)BS  GU: + Foley- d/w family today Skin: Warm and dry.  Intact. Psych: quiet, more interactive,  Musc: No edema in extremities.  No tenderness in extremities. Neuro: Alert Motor: Bilateral upper extremities: Grossly 5/5 proximal distal Bilateral lower extremities: Grossly 4/5 proximal to distal  Assessment/Plan: 1. Functional deficits which require 3+ hours per day of interdisciplinary therapy in a comprehensive inpatient rehab setting.  Physiatrist is providing close team supervision and 24 hour management of active medical problems listed below.  Physiatrist and rehab team continue to assess barriers to discharge/monitor patient progress toward functional and medical goals  Care Tool:  Bathing    Body parts bathed by  patient: Right arm,Left arm,Chest,Abdomen,Right upper leg,Left upper leg,Face   Body parts bathed by helper: Buttocks,Right lower leg,Left lower leg Body parts n/a: Front perineal area,Buttocks,Right lower leg,Left lower leg   Bathing assist Assist Level: Moderate Assistance - Patient 50 - 74%     Upper Body Dressing/Undressing Upper body dressing   What is the patient wearing?: Pull over shirt    Upper body assist Assist Level: Supervision/Verbal cueing    Lower Body Dressing/Undressing Lower body dressing      What is the patient wearing?: Incontinence brief,Pants     Lower body assist Assist for lower body dressing: Moderate Assistance - Patient 50 - 74%     Toileting Toileting    Toileting assist Assist for toileting: Moderate Assistance - Patient 50 - 74% Assistive Device Comment: wheelchair   Transfers Chair/bed transfer  Transfers assist     Chair/bed transfer assist level: Minimal Assistance - Patient > 75%     Locomotion Ambulation   Ambulation assist      Assist level: Minimal Assistance - Patient > 75% Assistive device: Walker-rolling Max distance: 120'   Walk 10 feet activity   Assist     Assist level: Minimal Assistance - Patient > 75% Assistive device: Walker-rolling   Walk 50 feet activity   Assist Walk 50 feet with 2 turns activity did not occur: Safety/medical concerns  Assist level: Minimal Assistance - Patient > 75% Assistive device: Walker-rolling    Walk 150 feet activity   Assist  Walk 150 feet activity did not occur: Safety/medical concerns         Walk 10 feet on uneven surface  activity   Assist Walk 10 feet on uneven surfaces activity did not occur: Safety/medical concerns         Wheelchair     Assist Will patient use wheelchair at discharge?: Yes (Per PT long term goals) Type of Wheelchair: Manual    Wheelchair assist level: Supervision/Verbal cueing Max wheelchair distance: 100'     Wheelchair 50 feet with 2 turns activity    Assist        Assist Level: Supervision/Verbal cueing   Wheelchair 150 feet activity     Assist      Assist Level: Moderate Assistance - Patient 50 - 74%   Blood pressure 128/68, pulse (!) 105, temperature 98.6 F (37 C), temperature source Oral, resp. rate 19, height 5\' 9"  (1.753 m), weight 71 kg, SpO2 99 %.  Medical Problem List and Plan: 1.  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  Antithrombotics: -DVT/anticoagulation:   Heparin.  Mechanical: Sequential compression devices, below knee Bilateral lower extremities   Dopplers negative for DVT  -antiplatelet therapy: N/A 3. Pain Management: will continue Oxycodone prn.   Controlled 12/28, decrease oxycodone to q4H prn.  12/30- will d/c gabapentin since causing constipation- will add Cymbalta 30 mg daily.  4. Mood: LCSW to follow for evaluation and support.              -antipsychotic agents: N/a 5. Neuropsych: This patient is capable of making decisions on his own behalf. 6. Skin/Wound Care: Routine pressure relief measures.  7. Fluids/Electrolytes/Nutrition: Monitor I/Os.             Added juven to promote healing.  8. 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              Plans to remove Foley on Wednesday, however family evaluating consideration of continuing  12/29- will need to discuss further  12/30- d/w family- will remove Foley Monday and restart Flomax 0.4 mg daily and add Florinef 0.1 mg daily to help with low BP.  9.  Acute renal failure: Resolved  Creatinine 0.86 on 12/27   Continue IVF 10. Acute blood loss anemia:   Hemoglobin 9.8 on 12/27, hgb up to 10.4, monitor weekly 11. Metastatic Lung AdenoCA:  On dulera--has been on supplemental oxygen             --Plans  for simulation on 12/27 with MD appt.   12/29- has appointment on 12/27- however will need to see what the plan is- it appears [facility] had different ideas on order than Oncology does.  12/30-   12.  Kleb/Proteus UTI: Completed course of Cipro 13. Resting tachycardia: 12/21 sinus tach RBBB on  EKG   Elevated to 98 on 12/28: continue to monitor. 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   12/30- will add lidocaine jelly for this.   14. Hypotension-   Severe orthostasis, positive on 12/27  TEDs/abdominal binder for support.  15.  Sleep disturbance: Scheduled melatonin at nights.   Trazodone 100  Improving 16. Neurogenic bladder- has foley due to urinary rentention  Flomax  DC'd due to orthostasis  12/30- will restart and start Florinef to help low BP. Will d/c Foley 1/3 to give Flomax a few days to work.  17. Tenting/mild dehydration- will push 6 cups/water daily minimum  Monitor in therapy  18. Hyperkalemia  Lokelma ordered on 12/23  Potassium 4.6 on 12/27 19. Pancoast tumor/ metastatic lung cancer: radiation to begin next week   12/30- to start radiation 1/3 and to see Dr Julien Nordmann 1/3 at 2:15 pm 20. Dysphagia per pt  12/30- ordered SLP eval and treat 21. Dispo - d/c 1/12 so far.     I spent a total of 45 minutes on total care today- >50% on coordination of care, speaking to family about- radiation, B/B, foley, and nerve pain and cognition and swallowing- ordering SLP.   LOS: 9 days A FACE TO FACE EVALUATION WAS PERFORMED  Nathaly Dawkins 01/21/2020, 10:18 AM

## 2020-01-21 NOTE — Progress Notes (Signed)
Physical Therapy Session Note  Patient Details  Name: Gregory Bautista MRN: [id number] Date of Birth: 1940-10-10  Today's Date: 01/21/2020 PT Individual Time: 0903-1005 PT Individual Time Calculation (min): 62 min   Short Term Goals: Week 1:  PT Short Term Goal 1 (Week 1): Pt will perform least restrictive transfer with min A consistently PT Short Term Goal 1 - Progress (Week 1): Met PT Short Term Goal 2 (Week 1): Pt will ambulate x 50 ft with assist x 1 PT Short Term Goal 2 - Progress (Week 1): Met PT Short Term Goal 3 (Week 1): Pt will initiate stair training PT Short Term Goal 3 - Progress (Week 1): Progressing toward goal Week 2:  PT Short Term Goal 1 (Week 2): pt to demonstrate ambulation with LRAD for 150' CGA PT Short Term Goal 2 (Week 2): pt to demonstrate functional transfers with LRAD at Gregory Bautista consistently PT Short Term Goal 3 (Week 2): pt to demonstrate dynamic standing balance at CGA with LRAD PT Short Term Goal 4 (Week 2): initiate stair training Week 3:     Skilled Therapeutic Interventions/Progress Updates:    pt received in bed and agreeable to therapy. Pt denied pain at start of session. Pt directed in supine>sit EOB CGA with PT setting up room and managing O2 and catheter. Pt then directed in Stand pivot transfer to Gregory Bautista with Rolling walker at [place]. Pt taken to gym in Gregory Bautista total A for time and energy. Pt directed in Stand pivot transfer to Gregory Bautista with Rolling walker CGA with PT managing lines. Pt directed in Gregory Bautista L2 for 3x2 mins intervals for improved tolerance to activity, increased BLE strengthening. Pt directed in gait training with Rolling walker for 100' CGA with VC for trunk extension, improved head carriage and breathing technique, PT managed lines. Pt directed in 2x5 step ups with Rolling walker for support min A improved to intermittent CGA with VC for technique, 4" step used to progress toward stair training. Pt then reported he needed to use restroom for BM, returned  to room in Gregory Bautista total A for time, and Stand pivot transfer to toilet with grab bar use, min A. Pt able to have BM, min A for clothing management and CGA for pericare. Pt then directed in Stand pivot transfer to Gregory Bautista at min A and requested to return to bed to rest at end of session. Pt directed in Stand pivot transfer from Gregory Bautista to bedside with Hand held assist min A, sit>supine CGA. Pt continues to require frequent rest breaks 2/2 fatigue and poor activity tolerance and requires assistance for line management with decreased planning to manage O2 tubing or foley at this time. Pt left in bed, alarm set, All needs in reach and in good condition. Call light in hand.     Therapy Documentation Precautions:  Precautions Precautions: Back,Fall Precaution Comments: reviewed precautions Restrictions Weight Bearing Restrictions: No General:   Vital Signs:  Pain: Pain Assessment Pain Scale: 0-10 Pain Score: 1  Pain Type: Chronic pain Pain Location: Chest Pain Orientation: Left Pain Descriptors / Indicators: Aching Pain Frequency: Occasional Pain Onset: With Activity Pain Intervention(s): Medication (See eMAR) Mobility:   Locomotion :    Trunk/Postural Assessment :    Balance:   Exercises:   Other Treatments:      Therapy/Group: Individual Therapy  [name] 01/21/2020, 10:24 AM

## 2020-01-21 NOTE — Progress Notes (Addendum)
Bowel program: dig stem completed with small amount of stool in vault, suppository administered.   Dig stem administered with small results; will inform next shift to continue with program. Pt left in side lying position.

## 2020-01-21 NOTE — Progress Notes (Signed)
Occupational Therapy Session Note  Patient Details  Name: Gregory Bautista MRN: [id number] Date of Birth: November 23, 1940  Today's Date: 01/21/2020 OT Individual Time: 4315-4008 OT Individual Time Calculation (min): 60 min    Short Term Goals: Week 1:  OT Short Term Goal 1 (Week 1): Pt will be able to stand from EOB to Rw with CGA to prepare for LB self care. OT Short Term Goal 1 - Progress (Week 1): Progressing toward goal OT Short Term Goal 2 (Week 1): Pt will transfer to toilet with min A. OT Short Term Goal 2 - Progress (Week 1): Progressing toward goal OT Short Term Goal 3 (Week 1): Pt will complete toileting with min A. OT Short Term Goal 3 - Progress (Week 1): Progressing toward goal OT Short Term Goal 4 (Week 1): Pt will don pants with min a. OT Short Term Goal 4 - Progress (Week 1): Progressing toward goal OT Short Term Goal 5 (Week 1): Pt will be able to tolerate standing for 5 min with RW with CGA. OT Short Term Goal 5 - Progress (Week 1): Progressing toward goal Week 2:  OT Short Term Goal 1 (Week 2): Pt will be able to stand from EOB to Rw with CGA to prepare for LB self care. OT Short Term Goal 2 (Week 2): Pt will transfer to toilet with min A. OT Short Term Goal 3 (Week 2): Pt will complete toileting with min A. OT Short Term Goal 4 (Week 2): Pt will don pants with min a. OT Short Term Goal 5 (Week 2): Pt will be able to tolerate standing for 5 min with RW with CGA.  Skilled Therapeutic Interventions/Progress Updates:    Therapy schedule was too start at 11am but pt on the phone with his family and stated he had important medical matters he needed to discuss with them.  Agreed to come back in 15 min.  When returned, realized pt's O2 nasal canula resting on his pillow.  He stated "oh they must have not put it back on me when I came back from the bathroom".  Encouraged pt to wear it at all times, even to the bathroom and to be cognizant of it as he is capable of putting it on  himself.  His O2 sats on room air were 89-90%.  Reapplied 2L and then pt was able to sit up to EOB with S.   He did move slowly but was able to move much more independently today, completing several sit to stands with S and stand pivots with CGA.  He continues to need more A with LB bathing and dressing due to foley tubing and back precautions.   He appeared stronger today, but his cognition seemed slower. He seemed to have more difficulty with problem solving, processing needing increased cues to find items on his sink for self care or remembering how to lock/ unlock wc brakes.    Pt resting in wc at end of session with all needs met and belt alarm on.   Therapy Documentation Precautions:  Precautions Precautions: Back,Fall Precaution Comments: reviewed precautions Restrictions Weight Bearing Restrictions: No    Vital Signs: Oxygen Therapy SpO2: 97 % O2 Device: Room Air Pain: Pain Assessment Pain Scale: 0-10 Pain Score: 5  Pain Type: Chronic pain Pain Location: Chest Pain Orientation: Left Pain Descriptors / Indicators: Burning Pain Frequency: Occasional Pain Onset: On-going Pain Intervention(s): RN made aware ADL: ADL Eating: Set up Grooming: Setup Where Assessed-Grooming: Sitting at sink Upper Body  Bathing: Supervision/safety Where Assessed-Upper Body Bathing: Sitting at sink Lower Body Bathing: Moderate assistance Where Assessed-Lower Body Bathing: Standing at sink,Sitting at sink Upper Body Dressing: Supervision/safety Where Assessed-Upper Body Dressing: Sitting at sink Lower Body Dressing: Moderate assistance Where Assessed-Lower Body Dressing: Wheelchair Toileting: Moderate assistance Where Assessed-Toileting: Glass blower/designer: Minimal Print production planner Method: Stand Ecologist: Raised toilet seat   Therapy/Group: Individual Therapy  [place] 01/21/2020, 11:49 AM

## 2020-01-21 NOTE — Progress Notes (Signed)
Bowel program completed. Dig stimulation was performed. Medium amount of mushy, light brown stool. Peri care done. Pt tolerated well.

## 2020-01-21 NOTE — Progress Notes (Signed)
Pt refused to wear  PRAFO boots  at night.

## 2020-01-21 NOTE — Progress Notes (Signed)
Occupational Therapy Session Note  Patient Details  Name: Steel Kerney MRN: [id number] Date of Birth: 1941/01/19  Today's Date: 01/21/2020 OT Individual Time: 1302-1400 OT Individual Time Calculation (min): 58 min   Short Term Goals: Week 2:  OT Short Term Goal 1 (Week 2): Pt will be able to stand from EOB to Rw with CGA to prepare for LB self care. OT Short Term Goal 2 (Week 2): Pt will transfer to toilet with min A. OT Short Term Goal 3 (Week 2): Pt will complete toileting with min A. OT Short Term Goal 4 (Week 2): Pt will don pants with min a. OT Short Term Goal 5 (Week 2): Pt will be able to tolerate standing for 5 min with RW with CGA.  Skilled Therapeutic Interventions/Progress Updates:  Pt greeted seated in wc and agreeable to OT treatment session. Pt brought to therapy gym and worked on UB there-ex using [employer] arm bike for 5 mins x 2. 2 minute rest break in between sets. Worked on standing balance/endurance with standing tracing activity using BITS system. Pt tolerated standing for 4, 3 minute bouts, but then his legs began to buckle requiring him to sit. Pt continued working on memory with BITS system as well as finger isolation to trace shapes and numbers. Pt returned to room and ambulated 10 feet back to bed with RW and min A. Pt left semi-reclined in bed with bed alarm on, call bell in reach, and needs met.   Therapy Documentation Precautions:  Precautions Precautions: Back,Fall Precaution Comments: reviewed precautions Restrictions Weight Bearing Restrictions: No Pain: Pain Assessment Pain Score: 5  Pain Type: Chronic pain Pain Location: Chest Pain Orientation: Left Pain Descriptors / Indicators: Burning Pain Onset: On-going Pain Intervention(s): RN placed lidocain patches   Therapy/Group: Individual Therapy  Valma Cava 01/21/2020, 2:02 PM

## 2020-01-22 ENCOUNTER — Inpatient Hospital Stay (HOSPITAL_COMMUNITY): Payer: Medicare Other

## 2020-01-22 ENCOUNTER — Encounter (HOSPITAL_COMMUNITY): Payer: Medicare Other | Admitting: Psychology

## 2020-01-22 MED ORDER — DOCUSATE SODIUM 100 MG PO CAPS
100.0000 mg | ORAL_CAPSULE | Freq: Three times a day (TID) | ORAL | Status: DC
  Administered 2020-01-22 – 2020-02-10 (×38): 100 mg via ORAL
  Filled 2020-01-22 (×46): qty 1

## 2020-01-22 NOTE — Consult Note (Signed)
Neuropsychological Consultation   Patient:   Gregory Bautista   DOB:   1940-07-02  MR Number:  [id number]  Location:  [facility] [address] [facility] [place] [id number] [place] [id number] Dept: [place]: [phone]           Date of Service:   01/22/2020  Start Time:   9 AM End Time:   10 AM  Provider/Observer:  Ilean Skill, Psy.D.       Clinical Neuropsychologist       Billing Code/Service: [phone]  Chief Complaint:    Gregory Bautista is a 79 year old male with a history of smoking, 51-month history of progressive left chest wall pain progressing to bilateral lower extremity weakness and difficulty walking as well as difficulty voiding.  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  Tumor was not felt to be resection of bowel due to advanced disease and spine stabilization recommended with plans for chemotherapy and radiation therapy in the future.  Patient was admitted on 12/30/2019 for T2--T4 laminectomy for tumor resection with T1-T5 posterior arthrodesis with pedicle screw and rods.  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  Hospital course has been significant for acute renal failure, incontinence and remains on supplemental oxygen.  Patient continues to be limited by BLE weakness with instability.  Reason for Service:  The patient was referred for neuropsychological consultation due to coping and adjustment issues with extended hospital stay, diagnosis of metastatic cancer with spine involvement and significant debility.  Below is the HPI for the current admission.  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  Tumor was not felt to be resectable due to advanced disease and spine stabilization recommended with plans for chemo and XRT in the future.  He was evaluated by Dr. Arnoldo Morale and was admitted on 12/30/2019 for T2-T4 laminectomy for tumor resection with T1-T5 posterior arthrodesis with pedicle screws and rods.  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  Plans are for XRT by Dr. Randa Ngo with reports to of simulation set for 12/27. Had been set for follow up with Dr. Earlie Server on outpatient basis.   Hospital course has been significant for acute renal failure, overflow incontinence with bladder volumes up to 1250 ml and failed voiding trial therefore foley replaced as well as hypoxia with activity-->remains on supplemental oxygen.  Urine culture was done showing evidence of Proteus and Klebsiella UTI and he was started on Cipro today. He has had ongoing issues with resting tachycardia felt to be due to dehydration. Patient continues to be limited by BLE weakness with instability, left chest pain with burning and has been weaned off oxygen. He continues to have limitations in mobility and ADLS. CIR recommended due to functional decline.   Current Status:  Upon entering the room, the patient was laying back in his bed slightly elevated.  He was on supplemental oxygen.  Patient  was oriented but slowed information processing speed and fatigue was noted.  Expressive and receptive language abilities were intact.  Patient describes some of the struggles that he has been going through medically and confusion the family has had of whether chemo was to start before after radiation therapy.  The plan is for radiation therapy to  start very soon and once that is completed for chemotherapy is to begin.  The patient reports that is difficult for him to do much due to pain and ongoing physical limitations.  The patient reports that he has been frustrated with some negative mood states and melancholia but denies any significant/severe depressive symptoms.  Patient is not taking any psychotropic medications with the exception of trazodone at night for sleep.  Behavioral Observation: Gregory Bautista  presents as a 79 y.o.-year-old Right African American Male who appeared his stated age. his dress was Appropriate and he was Well Groomed and his manners were Appropriate to the situation.  his participation was indicative of Appropriate and Redirectable behaviors.  There were physical disabilities noted.  he displayed an appropriate level of cooperation and motivation.     Interactions:    Active Appropriate and Redirectable  Attention:   abnormal and attention span appeared shorter than expected for age  Memory:   within normal limits; recent and remote memory intact  Visuo-spatial:  not examined  Speech (Volume):  low  Speech:   normal; slowed response style  Thought Process:  Coherent and Relevant  Though Content:  WNL; not suicidal and not homicidal  Orientation:   person, place, time/date and situation  Judgment:   Fair  Planning:   Poor  Affect:    Blunted and Lethargic  Mood:    Dysphoric  Insight:   Fair  Intelligence:   normal  Medical History:   Past Medical History:  Diagnosis Date  . Allergic rhinitis    NEC  . Asthma   . Glaucoma   . Lung cancer, main bronchus ([place]) 12/2019  . Tobacco use disorder 09/24/2019         Patient Active Problem List   Diagnosis Date Noted  . Hyperkalemia   . Neurogenic bladder   . Sleep disturbance   . Postoperative pain   . 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 Mass of upper  lobe of left lung 12/24/2019  . Tobacco use disorder 09/24/2019        Psychiatric History:  No prior psychiatric history  Family Med/Psych History:  Family History  Problem Relation Age of Onset  . Cancer Mother   . Heart attack Father   . Cancer Brother     Impression/DX:  Gregory Bautista is a 79 year old male with a history of smoking, 31-month history of progressive left chest wall pain progressing to bilateral lower extremity weakness and difficulty walking as well as difficulty voiding.  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  Tumor was not felt to be resection of bowel due to advanced disease and spine stabilization recommended with plans for chemotherapy and radiation therapy in the future.  Patient was admitted on 12/30/2019 for T2--T4 laminectomy for tumor resection with T1-T5 posterior arthrodesis with pedicle screw and rods.  Pathology positive for adenocarcinoma of  lung origin.  Hospital course has been significant for acute renal failure, incontinence and remains on supplemental oxygen.  Patient continues to be limited by BLE weakness with instability.  Upon entering the room, the patient was laying back in his bed slightly elevated.  He was on supplemental oxygen.  Patient was oriented but slowed information processing speed and fatigue was noted.  Expressive and receptive language abilities were intact.  Patient describes some of the struggles that he has been going through medically and confusion the family has had of whether chemo was to start before after radiation therapy.  The plan is for radiation therapy to start very soon and once that is completed for chemotherapy is to begin.  The patient reports that is difficult for him to do much due to pain and ongoing physical limitations.   The patient reports that he has been frustrated with some negative mood states and melancholia but denies any significant/severe depressive symptoms.  Patient is not taking any psychotropic medications with the exception of trazodone at night for sleep.  Disposition/Plan:  Today we worked on coping and adjustment issues with dealing with issues around significant physical limitations, metastatic lung cancer and extended hospital stay with plan to start radiation very soon and then chemotherapies.          Electronically Signed   _______________________ Ilean Skill, Psy.D. Clinical Neuropsychologist

## 2020-01-22 NOTE — Progress Notes (Signed)
Gregory Bautista PHYSICAL MEDICINE & REHABILITATION PROGRESS NOTE   Subjective/Complaints: No complaints this morning.  When asked he does note some constipation, and would like for his stool softeners to be increased.  Breathing well, denies pain.   ROS:  Pt denies SOB, abd pain, CP, N/V/C/D, and vision changes. Breathing well.   Objective:   No results found. No results for input(s): WBC, HGB, HCT, PLT in the last 72 hours. No results for input(s): NA, K, CL, CO2, GLUCOSE, BUN, CREATININE, CALCIUM in the last 72 hours.  Intake/Output Summary (Last 24 hours) at 01/22/2020 1016 Last data filed at 01/22/2020 0900 Gross per 24 hour  Intake 800 ml  Output 950 ml  Net -150 ml    Physical Exam: Vital Signs Blood pressure 97/60, pulse 99, temperature 98.5 F (36.9 C), resp. rate 16, height 5\' 9"  (1.753 m), weight 68.9 kg, SpO2 98 %. Gen: no distress, normal appearing HEENT: oral mucosa pink and moist, NCAT Cardio: Reg rate Chest: normal effort, normal rate of breathing Abd: soft, non-distended Ext: no edema GU: + Foley Skin: Warm and dry.  Intact. Psych: quiet, more interactive,  Musc: No edema in extremities.  No tenderness in extremities. Neuro: Alert Motor: Bilateral upper extremities: Grossly 5/5 proximal distal Bilateral lower extremities: Grossly 4/5 proximal to distal  Assessment/Plan: 1. Functional deficits which require 3+ hours per day of interdisciplinary therapy in a comprehensive inpatient rehab setting.  Physiatrist is providing close team supervision and 24 hour management of active medical problems listed below.  Physiatrist and rehab team continue to assess barriers to discharge/monitor patient progress toward functional and medical goals  Care Tool:  Bathing    Body parts bathed by patient: Right arm,Left arm,Chest,Abdomen,Right upper leg,Left upper leg,Face   Body parts bathed by helper: Buttocks,Right lower leg,Left lower leg Body parts n/a: Front  perineal area,Buttocks,Right lower leg,Left lower leg   Bathing assist Assist Level: Moderate Assistance - Patient 50 - 74%     Upper Body Dressing/Undressing Upper body dressing   What is the patient wearing?: Pull over shirt    Upper body assist Assist Level: Set up assist    Lower Body Dressing/Undressing Lower body dressing      What is the patient wearing?: Incontinence brief,Pants     Lower body assist Assist for lower body dressing: Moderate Assistance - Patient 50 - 74%     Toileting Toileting    Toileting assist Assist for toileting: Moderate Assistance - Patient 50 - 74% Assistive Device Comment: wheelchair   Transfers Chair/bed transfer  Transfers assist     Chair/bed transfer assist level: Minimal Assistance - Patient > 75%     Locomotion Ambulation   Ambulation assist      Assist level: Minimal Assistance - Patient > 75% Assistive device: Walker-rolling Max distance: 120'   Walk 10 feet activity   Assist     Assist level: Minimal Assistance - Patient > 75% Assistive device: Walker-rolling   Walk 50 feet activity   Assist Walk 50 feet with 2 turns activity did not occur: Safety/medical concerns  Assist level: Minimal Assistance - Patient > 75% Assistive device: Walker-rolling    Walk 150 feet activity   Assist Walk 150 feet activity did not occur: Safety/medical concerns         Walk 10 feet on uneven surface  activity   Assist Walk 10 feet on uneven surfaces activity did not occur: Safety/medical concerns         Wheelchair  Assist Will patient use wheelchair at discharge?: Yes (Per PT long term goals) Type of Wheelchair: Manual    Wheelchair assist level: Supervision/Verbal cueing Max wheelchair distance: 100'    Wheelchair 50 feet with 2 turns activity    Assist        Assist Level: Supervision/Verbal cueing   Wheelchair 150 feet activity     Assist      Assist Level: Moderate  Assistance - Patient 50 - 74%   Blood pressure 97/60, pulse 99, temperature 98.5 F (36.9 C), resp. rate 16, height 5\' 9"  (1.753 m), weight 68.9 kg, SpO2 98 %.  Medical Problem List and Plan: 1.  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  Antithrombotics: -DVT/anticoagulation:   Heparin.  Mechanical: Sequential compression devices, below knee Bilateral lower extremities   Dopplers negative for DVT  -antiplatelet therapy: N/A 3. Pain Management: will continue Oxycodone prn.   Controlled 12/28, decrease oxycodone to q4H prn.  12/30- will d/c gabapentin since causing constipation- will add Cymbalta 30 mg daily.  4. Mood: LCSW to follow for evaluation and support.              -antipsychotic agents: N/a 5. Neuropsych: This patient is capable of making decisions on his own behalf. 6. Skin/Wound Care: Routine pressure relief measures.  7. Fluids/Electrolytes/Nutrition: Monitor I/Os.             Added juven to promote healing.  8. 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              Plans to remove Foley on Wednesday, however family evaluating consideration of continuing  12/29- will need to discuss further  12/30- d/w family- will remove Foley Monday and restart Flomax 0.4 mg daily and add Florinef 0.1 mg daily to help with low BP.  9.  Acute renal failure: Resolved  Creatinine 0.86 on 12/27   Conitnue IVF 10. Acute blood loss anemia:   Hemoglobin 9.8 on 12/27, hgb up to 10.4, monitor weekly 11. Metastatic Lung AdenoCA:  On dulera--has been on supplemental oxygen             --Plans for simulation on 12/27 with MD appt.   12/29- has appointment on 12/27- however will need to see what the plan is- it appears [facility] had different ideas on order than Oncology does.  12.  Kleb/Proteus UTI: Completed course of Cipro 13. Resting tachycardia: 12/21  sinus tach RBBB on  EKG   Elevated to 99 on 12/31: continue to monitor.  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   12/30- will add lidocaine jelly for this.    12/31: patient notes continued constipation: will change colace 100mg  TID.  14. Hypotension-   Severe orthostasis, positive on 12/27  12/31: hypotensive but asymptomatic: continue to monitor.   TEDs/abdominal binder for support.  15.  Sleep disturbance: Scheduled melatonin at nights.   Trazodone 100  Improving 16. Neurogenic bladder- has foley due to urinary rentention  Flomax DC'd due to orthostasis  12/30- will restart and start Florinef to help low BP. Will d/c Foley 1/3 to give Flomax a few days to work.  17. Tenting/mild dehydration- will push 6 cups/water daily minimum  Monitor in therapy  18.  Hyperkalemia  Lokelma ordered on 12/23  Potassium 4.6 on 12/27 19. Pancoast tumor/ metastatic lung cancer: radiation to begin next week   12/30- to start radiation 1/3 and to see Dr Julien Nordmann 1/3 at 2:15 pm 20. Dysphagia per pt  12/30- ordered SLP eval and treat 21. Dispo - d/c 1/12 so far.     LOS: 10 days A FACE TO FACE EVALUATION WAS PERFORMED  Martha Clan P Pang Robers 01/22/2020, 10:16 AM

## 2020-01-22 NOTE — Progress Notes (Signed)
Physical Therapy Session Note  Patient Details  Name: Gregory Bautista MRN: [id number] Date of Birth: 08/02/1940  Today's Date: 01/22/2020 PT Individual Time: 1500-1600 PT Individual Time Calculation (min): 60 min   Short Term Goals: Week 2:  PT Short Term Goal 1 (Week 2): pt to demonstrate ambulation with LRAD for 150' CGA PT Short Term Goal 2 (Week 2): pt to demonstrate functional transfers with LRAD at [facility] consistently PT Short Term Goal 3 (Week 2): pt to demonstrate dynamic standing balance at CGA with LRAD PT Short Term Goal 4 (Week 2): initiate stair training  Skilled Therapeutic Interventions/Progress Updates:    Patient in supine, agreeable to PT.  Supine to sit with S increased time, assist for catheter repositioning.  Patient seated with mod A to don pants seated and sit to stand.  Patient min A for sit to stand and to transfer to w/c.  Maintained on 2L O2 throughout session, spot checking after ambulation with SpO2 96%.  Patient assisted in w/c to dayroom total A for energy conservation.  Patient sit to stand min A and ambulated x 134' with min A using RW and noting R LE instability at times.  Patient  Performed LE therex 5# on L and 3# on R for seated LAQ 2 x 10, standing hip flexion, heel raises x 10.  Patient requesting to toilet.  Assisted in w/c to room and patient sit to stand, step to [facility] with RW and min A.  Completed toileting + for BM and performed hygiene seated with S.  Patient sit to stand and min A to don pants.  Stepped to w/c with RW and min A.  Patient assisted back to dayroom for using Wii for standing balance.  Attempted to educated to use controller for golf game with hand over hand A in standing with min A for balance with 1 UE support, but pt c/o too much work to learn the game.  Assisted in w/c to room, sit to stand and stand step to bed min A using RW.  S for sit to supine.  Left with call bell in reach, bed alarm active and needs in reach. RN aware of pt able to  have BM.   Therapy Documentation Precautions:  Precautions Precautions: Back,Fall Precaution Comments: reviewed precautions Restrictions Weight Bearing Restrictions: No Pain: Pain Assessment Pain Scale: 0-10 Pain Score: 0-No pain Pain Type: Acute pain Pain Location: Chest Pain Orientation: Left;Lateral Pain Descriptors / Indicators: Aching;Burning Pain Frequency: Constant Pain Onset: On-going Pain Intervention(s): Medication (See eMAR)    Therapy/Group: Individual Therapy  Reginia Naas  Magda Kiel, PT 01/22/2020, 4:10 PM

## 2020-01-22 NOTE — Progress Notes (Signed)
Occupational Therapy Session Note  Patient Details  Name: Gregory Bautista MRN: [id number] Date of Birth: [birth year]  Today's Date: 01/22/2020 OT Individual Time: 0700-0715 OT Individual Time Calculation (min): 15 min  and Today's Date: 01/22/2020 OT Missed Time: 57 Minutes Missed Time Reason: Other (comment) (eating breakfast)   Short Term Goals: Week 2:  OT Short Term Goal 1 (Week 2): Pt will be able to stand from EOB to Rw with CGA to prepare for LB self care. OT Short Term Goal 2 (Week 2): Pt will transfer to toilet with min A. OT Short Term Goal 3 (Week 2): Pt will complete toileting with min A. OT Short Term Goal 4 (Week 2): Pt will don pants with min a. OT Short Term Goal 5 (Week 2): Pt will be able to tolerate standing for 5 min with RW with CGA.  Skilled Therapeutic Interventions/Progress Updates:    Pt eating breakfast in bed with HOB elevated. Pt c/o difficulty swallowing and states he needs more time to eat. Encouraged pt to sit EOB or transfer to w/c to eat but pt declined. Pt states that sitting in w/c increases his pain. Checked back on pt several times but still eating breakfast. Pt missed 45 min skilled OT services.  Therapy Documentation Precautions:  Precautions Precautions: Back,Fall Precaution Comments: reviewed precautions Restrictions Weight Bearing Restrictions: No General: General OT Amount of Missed Time: 45 Minutes Pain:  Pt c/o 7/10 "burning" in L upper chest/thoracic area; RN aware     Therapy/Group: Individual Therapy  Leroy Libman 01/22/2020, 7:48 AM

## 2020-01-22 NOTE — Progress Notes (Signed)
Occupational Therapy Session Note  Patient Details  Name: Gregory Bautista MRN: [id number] Date of Birth: Nov 30, 1940  Today's Date: 01/22/2020 OT Individual Time: 1100-1200 OT Individual Time Calculation (min): 60 min    Short Term Goals: Week 2:  OT Short Term Goal 1 (Week 2): Pt will be able to stand from EOB to Rw with CGA to prepare for LB self care. OT Short Term Goal 2 (Week 2): Pt will transfer to toilet with min A. OT Short Term Goal 3 (Week 2): Pt will complete toileting with min A. OT Short Term Goal 4 (Week 2): Pt will don pants with min a. OT Short Term Goal 5 (Week 2): Pt will be able to tolerate standing for 5 min with RW with CGA.  Skilled Therapeutic Interventions/Progress Updates:    Pt resting in bed upon arrival and agreeable to getting OOB to wash up and change clothing. Supine>sit EOB with supervision (assistance with foley bag). Sit>stand and stand pivot transfer to w/c with CGA. Pt completed UB bathing/dressing with supervision and more then a reasonable amount of time. Pt slow to initiate and complete all tasks. Sit<>stand at sink with CGA. Standing balance at sink with CGA. Pt required tot A for bathing buttocks. Pt required BUE support when standing at sink. Pt requested to return to bed and completed stand pivot transfer with CGA. Sit>supine with supervision. Pt remained in bed with all needs within reach and bed alarm activated.   Therapy Documentation Precautions:  Precautions Precautions: Back,Fall Precaution Comments: reviewed precautions Restrictions Weight Bearing Restrictions: No Pain: Pt c/o increased L flank pain ("burning") while seated in w/c with activity; returned to bed at end of session  Therapy/Group: Individual Therapy  Leroy Libman 01/22/2020, 12:04 PM

## 2020-01-22 NOTE — Progress Notes (Addendum)
Patient ID: Gregory Bautista, male   DOB: 1940-09-10, 79 y.o.   MRN: [id number] Pt has been scheduled with [facility] for pick up Monday @ 3:00 for 3:45 appointment and Tuesday-Friday @ 2:00 for 2:45 appointments.

## 2020-01-22 NOTE — Progress Notes (Signed)
Suppository refused, pt has had 2 continent BM's today.

## 2020-01-22 NOTE — Progress Notes (Signed)
Physical Therapy Note  Patient Details  Name: Gregory Bautista MRN: [id number] Date of Birth: 11-25-1940 Today's Date: 01/22/2020    Pt's plan of care adjusted to 15/7 after speaking with care team and discussed with MD in team conference as pt currently unable to tolerate current therapy schedule with OT, PT, and SLP.     [name] 01/22/2020, 8:36 AM

## 2020-01-23 ENCOUNTER — Inpatient Hospital Stay (HOSPITAL_COMMUNITY): Payer: Medicare Other | Admitting: Physical Therapy

## 2020-01-23 ENCOUNTER — Inpatient Hospital Stay (HOSPITAL_COMMUNITY): Payer: Medicare Other | Admitting: Occupational Therapy

## 2020-01-23 ENCOUNTER — Inpatient Hospital Stay (HOSPITAL_COMMUNITY): Payer: Medicare Other

## 2020-01-23 DIAGNOSIS — K5903 Drug induced constipation: Secondary | ICD-10-CM

## 2020-01-23 DIAGNOSIS — K592 Neurogenic bowel, not elsewhere classified: Secondary | ICD-10-CM

## 2020-01-23 NOTE — Progress Notes (Signed)
[place] PHYSICAL MEDICINE & REHABILITATION PROGRESS NOTE   Subjective/Complaints: Patient seen laying in bed this morning.  Slept fairly overnight due to headache and requests medication he states he slept fairly overnight due to headache.  Encourage patient to request as needed medications.  He was seen by neuropsychology yesterday, notes reviewed-working on coping.   ROS: +Left chest wall pain, mild SOB, Denies CP, N/V/D.  Objective:   No results found. No results for input(s): WBC, HGB, HCT, PLT in the last 72 hours. No results for input(s): NA, K, CL, CO2, GLUCOSE, BUN, CREATININE, CALCIUM in the last 72 hours.  Intake/Output Summary (Last 24 hours) at 01/23/2020 1509 Last data filed at 01/23/2020 1300 Gross per 24 hour  Intake 580 ml  Output 1250 ml  Net -670 ml    Physical Exam: Vital Signs Blood pressure (!) 95/59, pulse 95, temperature 98.7 F (37.1 C), temperature source Oral, resp. rate 16, height 5\' 9"  (1.753 m), weight 68.9 kg, SpO2 93 %. Constitutional: No distress . Vital signs reviewed. HENT: Normocephalic.  Atraumatic. Eyes: EOMI. No discharge. Cardiovascular: No JVD.  RRR. Respiratory: Normal effort.  No stridor.  Bilateral clear to auscultation. GI: Non-distended.  BS +. Skin: Warm and dry.  Intact. Psych: Normal mood.  Normal behavior. Musc: No edema in extremities.  No tenderness in extremities. Neuro: Alert Motor: Bilateral upper extremities: Grossly 5/5 proximal distal Bilateral lower extremities: Grossly 4+/5 proximal to distal  Assessment/Plan: 1. Functional deficits which require 3+ hours per day of interdisciplinary therapy in a comprehensive inpatient rehab setting.  Physiatrist is providing close team supervision and 24 hour management of active medical problems listed below.  Physiatrist and rehab team continue to assess barriers to discharge/monitor patient progress toward functional and medical goals  Care Tool:  Bathing    Body parts  bathed by patient: Right arm,Left arm,Chest,Abdomen,Right upper leg,Left upper leg,Face,Front perineal area   Body parts bathed by helper: Buttocks,Right lower leg,Left lower leg Body parts n/a: Front perineal area,Buttocks,Right lower leg,Left lower leg   Bathing assist Assist Level: Moderate Assistance - Patient 50 - 74%     Upper Body Dressing/Undressing Upper body dressing   What is the patient wearing?: Pull over shirt    Upper body assist Assist Level: Set up assist    Lower Body Dressing/Undressing Lower body dressing      What is the patient wearing?: Incontinence brief     Lower body assist Assist for lower body dressing: Total Assistance - Patient < 25%     Toileting Toileting    Toileting assist Assist for toileting: Moderate Assistance - Patient 50 - 74% Assistive Device Comment: BSC   Transfers Chair/bed transfer  Transfers assist     Chair/bed transfer assist level: Minimal Assistance - Patient > 75%     Locomotion Ambulation   Ambulation assist      Assist level: Minimal Assistance - Patient > 75% Assistive device: Walker-rolling Max distance: 134'   Walk 10 feet activity   Assist     Assist level: Minimal Assistance - Patient > 75% Assistive device: Walker-rolling   Walk 50 feet activity   Assist Walk 50 feet with 2 turns activity did not occur: Safety/medical concerns  Assist level: Minimal Assistance - Patient > 75% Assistive device: Walker-rolling    Walk 150 feet activity   Assist Walk 150 feet activity did not occur: Safety/medical concerns         Walk 10 feet on uneven surface  activity   Assist  Walk 10 feet on uneven surfaces activity did not occur: Safety/medical concerns         Wheelchair     Assist Will patient use wheelchair at discharge?: Yes (Per PT long term goals) Type of Wheelchair: Manual    Wheelchair assist level: Supervision/Verbal cueing Max wheelchair distance: 100'     Wheelchair 50 feet with 2 turns activity    Assist        Assist Level: Supervision/Verbal cueing   Wheelchair 150 feet activity     Assist      Assist Level: Moderate Assistance - Patient 50 - 74%   Blood pressure (!) 95/59, pulse 95, temperature 98.7 F (37.1 C), temperature source Oral, resp. rate 16, height 5\' 9"  (1.753 m), weight 68.9 kg, SpO2 93 %.  Medical Problem List and Plan: 1.  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  Antithrombotics: -DVT/anticoagulation:   Heparin.  Mechanical: Sequential compression devices, below knee Bilateral lower extremities   Dopplers negative for DVT  -antiplatelet therapy: N/A 3. Pain Management:    Decreased oxycodone to q4H prn.  DC gabapentin since causing constipation  Added Cymbalta 30 mg daily.   Controlled on 1/1 4. Mood: LCSW to follow for evaluation and support.              -antipsychotic agents: N/a 5. Neuropsych: This patient is capable of making decisions on his own behalf.  Appreciate neuropsych 6. Skin/Wound Care: Routine pressure relief measures.  7. Fluids/Electrolytes/Nutrition: Monitor I/Os.             Added juven to promote healing.  8. Neurogenic bladder: Foley in place due to urinary retention/UTI, with plans to remove on Monday  Flomax daily initiated on 12/30, monitor blood pressure closely  Florinef 0.1 mg daily initiated to assist with low BP.  9.  Acute renal failure: Resolved  Creatinine 0.86 on 12/27   Conitnue IVF 10. Acute blood loss anemia:   Hemoglobin 10.4 on 12/28  Continue to monitor 11. Metastatic Lung AdenoCA:  On dulera--has been on supplemental oxygen 12. Kleb/Proteus UTI: Completed course of Cipro 13. Resting tachycardia: 12/21 sinus tach RBBB on  EKG   Relatively controlled on 1/1.  14. Neurogenic bowel:   Responds to dig stim, added lidocaine  jelly  Colace increased per pt request Colace to 200 mg daily, increased to 3 times daily  Improving 14. Hypotension-   Severe orthostasis, negative on 12/31  Asymptomatic on 1/1  TEDs/abdominal binder for support.   See #8 15.  Sleep disturbance: Scheduled melatonin at nights.   Trazodone 100  Improving 17. Tenting/mild dehydration- will push 6 cups/water daily minimum  Monitor in therapy  18. Hyperkalemia  Lokelma ordered on 12/23  Potassium 4.6 on 12/27 19. Pancoast tumor/ metastatic lung cancer:   Radiation to begin on 1/3 and to see Dr Julien Nordmann 1/3 at 2:15 pm 20. Dysphagia per pt  12/30- ordered SLP eval and treat  LOS: 11 days A FACE TO FACE EVALUATION WAS PERFORMED  Gregory Bautista Lorie Phenix 01/23/2020, 3:09 PM

## 2020-01-23 NOTE — Progress Notes (Signed)
Physical Therapy Session Note  Patient Details  Name: Gregory Bautista MRN: [id number] Date of Birth: [birth year]  Today's Date: 01/23/2020 PT Individual Time:10/20-1102 42 min      Short Term Goals: Week 2:  PT Short Term Goal 1 (Week 2): pt to demonstrate ambulation with LRAD for 150' CGA PT Short Term Goal 2 (Week 2): pt to demonstrate functional transfers with LRAD at [facility] consistently PT Short Term Goal 3 (Week 2): pt to demonstrate dynamic standing balance at CGA with LRAD PT Short Term Goal 4 (Week 2): initiate stair training  Skilled Therapeutic Interventions/Progress Updates:   Pt received supine in bed and agreeable to PT. Supine>sit transfer with supervision assist. Stand pivot transfer to [facility] with CGA from PT for safety. Pt's BP assessed in sitting. 91/58. PT assisted pt to don knee high teds with max assist for time management. BP re-assessed. 90/58. Unable to locate abdominal binder.    Pt transported to rehab gym. Sit<>stand and PT assess othrostatic v/s sitting 94/65. Standing 72/61. Mild orthostatic s/s. Pt also reporting need for BM. Pt transported back to room.   Ambulatory transfer to toilet with CGA and RW. Pt able to void bowels and perform hygiene. Min assist for clothing management while pt maintained 1 UE supported on RW.   Pt returned to room and performed ambulatory transfer to bed with RW and CGA for safety. Sit>supine completed without assist, and left supine in bed with call bell in reach and all needs met. Throughout session pt maintained on 2L/min O2 with O2 at 100%.       Therapy Documentation Precautions:  Precautions Precautions: Back,Fall Precaution Comments: reviewed precautions Restrictions Weight Bearing Restrictions: No    Vital Signs: Therapy Vitals Pulse Rate: 84 BP: 101/60 Pain:   3/10 L side of chest at surgical site. Sharp burning. Pt repositioned.     Therapy/Group: Individual Therapy  Lorie Phenix 01/23/2020, 5:38 PM

## 2020-01-23 NOTE — Progress Notes (Signed)
Occupational Therapy Session Note  Patient Details  Name: Gregory Bautista MRN: [id number] Date of Birth: 1940-02-15  Today's Date: 01/23/2020 OT Individual Time: 1200-1230 OT Individual Time Calculation (min): 30 min    Short Term Goals: Week 1:  OT Short Term Goal 1 (Week 1): Pt will be able to stand from EOB to Rw with CGA to prepare for LB self care. OT Short Term Goal 1 - Progress (Week 1): Progressing toward goal OT Short Term Goal 2 (Week 1): Pt will transfer to toilet with min A. OT Short Term Goal 2 - Progress (Week 1): Progressing toward goal OT Short Term Goal 3 (Week 1): Pt will complete toileting with min A. OT Short Term Goal 3 - Progress (Week 1): Progressing toward goal OT Short Term Goal 4 (Week 1): Pt will don pants with min a. OT Short Term Goal 4 - Progress (Week 1): Progressing toward goal OT Short Term Goal 5 (Week 1): Pt will be able to tolerate standing for 5 min with RW with CGA. OT Short Term Goal 5 - Progress (Week 1): Progressing toward goal  Skilled Therapeutic Interventions/Progress Updates: Patient complained of extreme fatigue but concurred to work on endurance training via bed level.  As well, patient did received tub bench visual observational training from this OT practitioner as in he could view his in room shower area from his bed.   He stated that he hopes that upon discharge he will be prescribed a tub transfer bench.  Aftrer 30 minutes training, he stated he preferred to rest up before his lunch came so that he would not be too fatigued to eat.  At the end of this session, he was left with call bell and phone within reach.     This clinician offered more therapy later in the day and was awoken from his nap while eating a late lunch.  Patient stated, "I am sorry, but I am just too tired to do anything else at all today."  Continue OT Plan of care as patient tolerates in order to collaborate to assist him with reaching his goals and independence at  the highest, safest level possible.      Therapy Documentation Precautions:  Precautions Precautions: Back,Fall Precaution Comments: reviewed precautions Restrictions Weight Bearing Restrictions: No General: General OT Amount of Missed Time: 30 Minutes  Pain:denied   Therapy/Group: Individual Therapy  Alfredia Ferguson [facility] 01/23/2020, 3:13 PM

## 2020-01-23 NOTE — Progress Notes (Signed)
Patient claims he had a bowel movement today and refuse dulcolax suppository

## 2020-01-23 NOTE — Plan of Care (Signed)
  Problem: SCI BOWEL ELIMINATION Goal: RH STG MANAGE BOWEL WITH ASSISTANCE Description: STG Manage Bowel with  Min Assistance. Outcome: Not Progressing; bowel program   Problem: SCI BLADDER ELIMINATION Goal: RH STG MANAGE BLADDER WITH ASSISTANCE Description: STG Manage Bladder With Min Assistance Outcome: Not Progressing; foley

## 2020-01-24 ENCOUNTER — Inpatient Hospital Stay (HOSPITAL_COMMUNITY): Payer: Medicare Other

## 2020-01-24 ENCOUNTER — Inpatient Hospital Stay (HOSPITAL_COMMUNITY): Payer: Medicare Other | Admitting: Speech Pathology

## 2020-01-24 NOTE — Progress Notes (Signed)
Bowel program done ; Patient had no results at this time.

## 2020-01-24 NOTE — Evaluation (Signed)
Speech Language Pathology Assessment and Plan  Patient Details  Name: Gregory Bautista MRN: [id number] Date of Birth: 07/09/1940  SLP Diagnosis: Dysphagia  Rehab Potential: Good ELOS: 7-10 days    Today's Date: 01/24/2020 SLP Individual Time: 1105-1200 SLP Individual Time Calculation (min): 55 min   Hospital Problem: Principal Problem:   Thoracic myelopathy Active Problems:   Hyperkalemia   Neurogenic bladder   Sleep disturbance   Postoperative pain   Drug-induced constipation   Neurogenic bowel  Past Medical History:  Past Medical History:  Diagnosis Date  . Allergic rhinitis    NEC  . Asthma   . Glaucoma   . Lung cancer, main bronchus ([place]) 12/2019  . Tobacco use disorder 09/24/2019   Past Surgical History:  Past Surgical History:  Procedure Laterality Date  . COLON SURGERY    . EYE SURGERY Right   . LAMINECTOMY WITH POSTERIOR LATERAL ARTHRODESIS LEVEL 4 N/A 12/30/2019   Procedure: THORACIC THREE LAMINECTOMY, THORACIC INSTRUMENTATION AND FUSION THORACIC ONE-FIVE;  Surgeon: Newman Pies, MD;  Location: [place];  Service: Neurosurgery;  Laterality: N/A;    Assessment / Plan / Recommendation Clinical Impression   Gregory Bautista is a 80 year old male with history of smoking, 20-monthhistory of progressive left chest wall pain progressing to BLE weakness and difficulty walking as well as difficulty voiding who was originally evaluated in ED 12/04/2019 and found to have 7 cm Pancoast tumor with invasion of mediastinum to T2-T4 vertebral spine with erosion of T3 vertebral body and 50% loss of height with significant cord compression.  Tumor was not felt to be resectable due to advanced disease and spine stabilization recommended with plans for chemo and XRT in the future.  He was evaluated by Dr. JArnoldo Moraleand was admitted on 12/30/2019 for T2-T4 laminectomy for tumor resection with T1-T5 posterior arthrodesis with pedicle screws and rods.  Pathology positive for adenocarcinoma  of lung origin.  Plans are for XRT by Dr. KRanda Ngowith reports to of simulation set for 12/27. Had been set for follow up with Dr. MEarlie Serveron outpatient basis.   Hospital course has been significant for acute renal failure, overflow incontinence with bladder volumes up to 1250 ml and failed voiding trial therefore foley replaced as well as hypoxia with activity-->remains on supplemental oxygen.  Urine culture was done showing evidence of Proteus and Klebsiella UTI and he was started on Cipro today. He has had ongoing issues with resting tachycardia felt to be due to dehydration. Patient continues to be limited by BLE weakness with instability, left chest pain with burning and has been weaned off oxygen. He continues to have limitations in mobility and ADLS. CIR recommended due to functional decline. SLP evaluation was completed on 01/24/2020 with results as follows:   Pt presents with xerostomia and missing dentition which impacts his ability to masticate and clear regular textured solids from the oral cavity.  Pt also describes a globus sensation, outside of meal times, only at night which he attributes to a build up of saliva that he has difficulty managing.  It is possible that this could be a manifestation of reflux symptoms given it's specific timing outside of meals so SLP provided skilled education regarding managing symptoms of reflux and esophageal dysphagia, including getting out of bed for meals or at least upright in bed for meals and for 30-60 minutes post meal, alternating between solids and liquids, and taking small bites and sips.  SLP also discussed the benefits of downgrading pt's diet  to dys 3 textures to accommodate for debility, xerostomia, and missing dentition in the hopes of increasing pt's PO intake.  Pt was agreeable to diet modifications and verbalized understanding of all education provided.    Give the abovementioned deficits, pt would benefit from skilled ST while inpatient in order  to maximize functional independence with swallowing and reduce burden of care prior to discharge.  Discharge recommendations will be determined pending pt's progress made while inpatient.      Skilled Therapeutic Interventions          Bedside swallow evaluation completed with results and recommendations reviewed with patient.     SLP Assessment  Patient will need skilled Speech Lanaguage Pathology Services during CIR admission    Recommendations  SLP Diet Recommendations: Dysphagia 3 (Mech soft);Thin Liquid Administration via: Cup;Straw Medication Administration: Whole meds with liquid Supervision: Patient able to self feed;Intermittent supervision to cue for compensatory strategies Compensations: Minimize environmental distractions;Slow rate;Small sips/bites;Follow solids with liquid Postural Changes and/or Swallow Maneuvers: Out of bed for meals;Seated upright 90 degrees;Upright 30-60 min after meal Oral Care Recommendations: Oral care BID Recommendations for Other Services: Neuropsych consult Patient destination: Home Follow up Recommendations: Other (comment) (To be determined pending progress made while inpatient) Equipment Recommended: None recommended by SLP    SLP Frequency 1 to 3 out of 7 days   SLP Duration  SLP Intensity  SLP Treatment/Interventions 7-10 days  Minumum of 1-2 x/day, 30 to 90 minutes  Dysphagia/aspiration precaution training;Cueing hierarchy    Pain Pain Assessment Pain Scale: 0-10 Pain Score: 0-No pain  Prior Functioning Type of Home: House  Lives With: Alone Available Help at Discharge: Available 24 hours/day;Family     Care Tool Care Tool Cognition Expression of Ideas and Wants Expression of Ideas and Wants: Some difficulty - exhibits some difficulty with expressing needs and ideas (e.g, some words or finishing thoughts) or speech is not clear   Understanding Verbal and Non-Verbal Content Understanding Verbal and Non-Verbal Content:  Usually understands - understands most conversations, but misses some part/intent of message. Requires cues at times to understand   Memory/Recall Ability *first 3 days only Memory/Recall Ability *first 3 days only: That he or she is in a hospital/hospital unit;Current season     Bedside Swallowing Assessment General Previous Swallow Assessment: none on record Diet Prior to this Study: Regular;Thin liquids Temperature Spikes Noted: No Respiratory Status: Supplemental O2 delivered via (comment) (nasal cannula) Behavior/Cognition: Alert;Cooperative;Pleasant mood Oral Cavity - Dentition: Dentures, top;Missing dentition Self-Feeding Abilities: Able to feed self;Needs set up Patient Positioning: Upright in bed Baseline Vocal Quality: Low vocal intensity Volitional Swallow: Able to elicit  Oral Care Assessment   Ice Chips   Thin Liquid Thin Liquid: Within functional limits Nectar Thick   Honey Thick   Puree Puree: Within functional limits Solid Solid: Impaired Oral Phase Impairments: Impaired mastication Oral Phase Functional Implications: Prolonged oral transit;Oral residue BSE Assessment Suspected Esophageal Findings Suspected Esophageal Findings: Globus sensation (at night time, reports he feels it to be related to decreased management of secretions) Risk for Aspiration Impact on safety and function: Mild aspiration risk Other Related Risk Factors: Deconditioning  Short Term Goals: Week 1: SLP Short Term Goal 1 (Week 1): Pt will consume dys 3 textures and thin liquids with mod I use of swallowing precautions SLP Short Term Goal 2 (Week 1): Pt will consume therapeutic trials of regular textures to continue working towards diet progression with mod I use of swallowing precautions and no overt s/s of  aspiration  Refer to Care Plan for Long Term Goals  Recommendations for other services: Neuropsych  Discharge Criteria: Patient will be discharged from SLP if patient refuses  treatment 3 consecutive times without medical reason, if treatment goals not met, if there is a change in medical status, if patient makes no progress towards goals or if patient is discharged from hospital.  The above assessment, treatment plan, treatment alternatives and goals were discussed and mutually agreed upon: by patient  Emilio Math 01/24/2020, 12:58 PM

## 2020-01-24 NOTE — Plan of Care (Signed)
  Problem: SCI BOWEL ELIMINATION Goal: RH STG MANAGE BOWEL WITH ASSISTANCE Description: STG Manage Bowel with  Min Assistance. Outcome: Not Progressing; bowel program   Problem: SCI BLADDER ELIMINATION Goal: RH STG MANAGE BLADDER WITH ASSISTANCE Description: STG Manage Bladder With Min Assistance Outcome: Not Progressing; foley cath

## 2020-01-24 NOTE — Progress Notes (Signed)
Occupational Therapy Session Note  Patient Details  Name: Gregory Bautista MRN: [id number] Date of Birth: [birth year]  Today's Date: 01/24/2020 OT Individual Time: 2706-2376 OT Individual Time Calculation (min): 71 min    Short Term Goals: Week 2:  OT Short Term Goal 1 (Week 2): Pt will be able to stand from EOB to Rw with CGA to prepare for LB self care. OT Short Term Goal 2 (Week 2): Pt will transfer to toilet with min A. OT Short Term Goal 3 (Week 2): Pt will complete toileting with min A. OT Short Term Goal 4 (Week 2): Pt will don pants with min a. OT Short Term Goal 5 (Week 2): Pt will be able to tolerate standing for 5 min with RW with CGA.  Skilled Therapeutic Interventions/Progress Updates:    Pt received supine with RN administering morning medications, agreeable to OT session. 4/10 pain, OOB mobility as relief. Pt on 2L O2 via [place], SpO2 at 100, HR at 100 bpm at rest. Pt completed bed mobility with supervision, HOB elevated and use of bed rails. Assist provided for foley bag placement. Pt stood from low EOB with CGA. CGA ambulatory transfer to the sink with the RW. Oral care completed mod I seated. Pt able to doff shirt and complete UB bathing with set up assist. Shirt donned with supervision. Sit > stand with the sink for support with CGA. He was able to complete this transfer, as well as standing level lower body bathing on RA with desaturation to 93% only, rebounding with 1 L O2 in 1 minute to >95%. Pt instructed in threading foley bag through pants and increased time provided for him to practice this given that he will be at home alone, likely with foley. Several extended rest breaks provided during session. Pt required min to thread foley/LLE into pants. Demo provided re foley management. Following LB dressing pt SpO2 at 93-94% on 1L [place], HR 110 bpm. Discussed home use of a pulse oximeter as well as symptom monitoring for hypoxia and tachycardia, especially for guidance for rest breaks.  Discussed weaning from supplemental 02, especially at rest. Pt returned to EOB and completed brief BUE strengthening circuit with a 3lb dowel. Pt required tactile cueing and min A to maintain sitting balance when reaching forward. Pt returned to supine and was left on 1 L O2 via [place], SPO2 at 95%, HR 100 bpm. Bed alarm set.   Therapy Documentation Precautions:  Precautions Precautions: Back,Fall Precaution Comments: reviewed precautions Restrictions Weight Bearing Restrictions: No   Therapy/Group: Individual Therapy  Curtis Sites 01/24/2020, 7:22 AM

## 2020-01-24 NOTE — Progress Notes (Signed)
[place] PHYSICAL MEDICINE & REHABILITATION PROGRESS NOTE   Subjective/Complaints: Patient seen sitting up in bed this morning eating breakfast.  He states he slept well overnight.  He denies complaints.   ROS: Denies CP, shortness of breath, N/V/D.  Objective:   No results found. No results for input(s): WBC, HGB, HCT, PLT in the last 72 hours. No results for input(s): NA, K, CL, CO2, GLUCOSE, BUN, CREATININE, CALCIUM in the last 72 hours.  Intake/Output Summary (Last 24 hours) at 01/24/2020 0845 Last data filed at 01/24/2020 0700 Gross per 24 hour  Intake 1200 ml  Output 1670 ml  Net -470 ml    Physical Exam: Vital Signs Blood pressure 115/71, pulse (!) 101, temperature 98.8 F (37.1 C), temperature source Oral, resp. rate 16, height 5\' 9"  (1.753 m), weight 68.9 kg, SpO2 94 %. Constitutional: No distress . Vital signs reviewed. HENT: Normocephalic.  Atraumatic. Eyes: EOMI. No discharge. Cardiovascular: No JVD.  RRR. Respiratory: Normal effort.  No stridor.  Bilateral clear to auscultation. +[place]. GI: Non-distended.  BS +. Skin: Warm and dry.  Intact. Psych: Normal mood.  Normal behavior. Musc: No edema in extremities.  No tenderness in extremities. Neuro: Alert Motor: Bilateral upper extremities: Grossly 5/5 proximal distal Bilateral lower extremities: Grossly 4+/5 proximal to distal, stable  Assessment/Plan: 1. Functional deficits which require 3+ hours per day of interdisciplinary therapy in a comprehensive inpatient rehab setting.  Physiatrist is providing close team supervision and 24 hour management of active medical problems listed below.  Physiatrist and rehab team continue to assess barriers to discharge/monitor patient progress toward functional and medical goals  Care Tool:  Bathing    Body parts bathed by patient: Right arm,Left arm,Chest,Abdomen,Right upper leg,Left upper leg,Face,Front perineal area   Body parts bathed by helper: Buttocks,Right lower  leg,Left lower leg Body parts n/a: Front perineal area,Buttocks,Right lower leg,Left lower leg   Bathing assist Assist Level: Moderate Assistance - Patient 50 - 74%     Upper Body Dressing/Undressing Upper body dressing   What is the patient wearing?: Pull over shirt    Upper body assist Assist Level: Set up assist    Lower Body Dressing/Undressing Lower body dressing      What is the patient wearing?: Incontinence brief     Lower body assist Assist for lower body dressing: Total Assistance - Patient < 25%     Toileting Toileting    Toileting assist Assist for toileting: Moderate Assistance - Patient 50 - 74% Assistive Device Comment: BSC   Transfers Chair/bed transfer  Transfers assist     Chair/bed transfer assist level: Minimal Assistance - Patient > 75%     Locomotion Ambulation   Ambulation assist      Assist level: Minimal Assistance - Patient > 75% Assistive device: Walker-rolling Max distance: 134'   Walk 10 feet activity   Assist     Assist level: Minimal Assistance - Patient > 75% Assistive device: Walker-rolling   Walk 50 feet activity   Assist Walk 50 feet with 2 turns activity did not occur: Safety/medical concerns  Assist level: Minimal Assistance - Patient > 75% Assistive device: Walker-rolling    Walk 150 feet activity   Assist Walk 150 feet activity did not occur: Safety/medical concerns         Walk 10 feet on uneven surface  activity   Assist Walk 10 feet on uneven surfaces activity did not occur: Safety/medical concerns         Wheelchair  Assist Will patient use wheelchair at discharge?: Yes (Per PT long term goals) Type of Wheelchair: Manual    Wheelchair assist level: Supervision/Verbal cueing Max wheelchair distance: 100'    Wheelchair 50 feet with 2 turns activity    Assist        Assist Level: Supervision/Verbal cueing   Wheelchair 150 feet activity     Assist       Assist Level: Moderate Assistance - Patient 50 - 74%   Blood pressure 115/71, pulse (!) 101, temperature 98.8 F (37.1 C), temperature source Oral, resp. rate 16, height 5\' 9"  (1.753 m), weight 68.9 kg, SpO2 94 %.  Medical Problem List and Plan: 1.  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  Antithrombotics: -DVT/anticoagulation:   Heparin.  Mechanical: Sequential compression devices, below knee Bilateral lower extremities   Dopplers negative for DVT  -antiplatelet therapy: N/A 3. Pain Management:    Decreased oxycodone to q4H prn.  DC gabapentin since causing constipation  Added Cymbalta 30 mg daily.   Controlled on 1/2 4. Mood: LCSW to follow for evaluation and support.              -antipsychotic agents: N/a 5. Neuropsych: This patient is capable of making decisions on his own behalf.  Appreciate neuropsych 6. Skin/Wound Care: Routine pressure relief measures.  7. Fluids/Electrolytes/Nutrition: Monitor I/Os.             Added juven to promote healing.  8. Neurogenic bladder: Foley in place due to urinary retention/UTI, with plans to remove on Monday  Flomax daily initiated on 12/30, monitor blood pressure closely  Florinef 0.1 mg daily initiated to assist with low BP.   Improving on 1/2 9.  Acute renal failure: Resolved  Creatinine 0.86 on 12/27   Conitnue IVF 10. Acute blood loss anemia:   Hemoglobin 12.4 on 12/28  Continue to monitor 11. Metastatic Lung AdenoCA:  On dulera--has been on supplemental oxygen 12. Kleb/Proteus UTI: Completed course of Cipro 13. Resting tachycardia: 12/21 sinus tach RBBB on  EKG   Borderline on 1/2, no changes today 14. Neurogenic bowel:   Responds to dig stim, added lidocaine jelly  Colace increased per pt request Colace to 200 mg daily, increased to 3 times daily  Improving overall 14. Hypotension-   Severe  orthostasis, negative on 12/31  Relatively controlled on 1/2  TEDs/abdominal binder for support.   See #8 15.  Sleep disturbance: Scheduled melatonin at nights.   Trazodone 100  Improving 17. Tenting/mild dehydration- will push 6 cups/water daily minimum 18. Hyperkalemia  Lokelma ordered on 12/23  Potassium 4.6 on 12/27 19. Pancoast tumor/ metastatic lung cancer:   Radiation to begin on 1/3 and to see Dr Julien Nordmann 1/3 at 2:15 pm 20. Dysphagia per pt  12/30- ordered SLP eval and treat  LOS: 12 days A FACE TO FACE EVALUATION WAS PERFORMED  Kindle Strohmeier Lorie Phenix 01/24/2020, 8:45 AM

## 2020-01-25 ENCOUNTER — Other Ambulatory Visit: Payer: Medicare Other

## 2020-01-25 ENCOUNTER — Ambulatory Visit (HOSPITAL_COMMUNITY): Payer: Medicare Other

## 2020-01-25 ENCOUNTER — Encounter (HOSPITAL_COMMUNITY): Payer: Self-pay | Admitting: Internal Medicine

## 2020-01-25 ENCOUNTER — Inpatient Hospital Stay (HOSPITAL_COMMUNITY): Payer: Medicare Other | Admitting: Physical Therapy

## 2020-01-25 ENCOUNTER — Ambulatory Visit
Admission: RE | Admit: 2020-01-25 | Discharge: 2020-01-25 | Disposition: A | Discharge status: Home / Self Care | Payer: Medicare Other | Source: Ambulatory Visit | Attending: Radiation Oncology | Admitting: Radiation Oncology

## 2020-01-25 ENCOUNTER — Encounter (HOSPITAL_COMMUNITY): Payer: Medicare Other | Admitting: Psychology

## 2020-01-25 ENCOUNTER — Inpatient Hospital Stay (HOSPITAL_COMMUNITY): Payer: Medicare Other

## 2020-01-25 ENCOUNTER — Ambulatory Visit: Payer: Medicare Other | Admitting: Internal Medicine

## 2020-01-25 DIAGNOSIS — R918 Other nonspecific abnormal finding of lung field: Secondary | ICD-10-CM | POA: Diagnosis not present

## 2020-01-25 LAB — BASIC METABOLIC PANEL
Anion gap: 9 (ref 5–15)
BUN: 18 mg/dL (ref 8–23)
CO2: 27 mmol/L (ref 22–32)
Calcium: 11.2 mg/dL — ABNORMAL HIGH (ref 8.9–10.3)
Chloride: 100 mmol/L (ref 98–111)
Creatinine, Ser: 0.86 mg/dL (ref 0.61–1.24)
GFR, Estimated: >60 mL/min (ref >60)
Glucose, Bld: 96 mg/dL (ref 70–99)
Potassium: 4.4 mmol/L (ref 3.5–5.1)
Sodium: 136 mmol/L (ref 135–145)

## 2020-01-25 LAB — CBC WITH DIFFERENTIAL/PLATELET
Abs Immature Granulocytes: 0.01 10*3/uL (ref 0.00–0.07)
Basophils Absolute: 0 10*3/uL (ref 0.0–0.1)
Basophils Relative: 0 %
Eosinophils Absolute: 0.1 10*3/uL (ref 0.0–0.5)
Eosinophils Relative: 2 %
HCT: 35.8 % — ABNORMAL LOW (ref 39.0–52.0)
Hemoglobin: 11.6 g/dL — ABNORMAL LOW (ref 13.0–17.0)
Immature Granulocytes: 0 %
Lymphocytes Relative: 30 %
Lymphs Abs: 1.8 10*3/uL (ref 0.7–4.0)
MCH: 28.9 pg (ref 26.0–34.0)
MCHC: 32.4 g/dL (ref 30.0–36.0)
MCV: 89.1 fL (ref 80.0–100.0)
Monocytes Absolute: 0.7 10*3/uL (ref 0.1–1.0)
Monocytes Relative: 12 %
Neutro Abs: 3.2 10*3/uL (ref 1.7–7.7)
Neutrophils Relative %: 56 %
Platelets: 290 10*3/uL (ref 150–400)
RBC: 4.02 MIL/uL — ABNORMAL LOW (ref 4.22–5.81)
RDW: 12.8 % (ref 11.5–15.5)
WBC: 5.8 10*3/uL (ref 4.0–10.5)
nRBC: 0 % (ref 0.0–0.2)

## 2020-01-25 MED ORDER — DULOXETINE HCL 30 MG PO CPEP
60.0000 mg | ORAL_CAPSULE | Freq: Every day | ORAL | Status: DC
  Administered 2020-01-25 – 2020-02-06 (×10): 60 mg via ORAL
  Filled 2020-01-25 (×11): qty 2

## 2020-01-25 NOTE — Progress Notes (Signed)
Day shift unable to start bowel program. Started on night shift.  1940 Suppository administered. Dig stem with results, small brown bowel movement expelled. Multiple small clumps of feces noted.  2020 Dig stemmed patient, no results.

## 2020-01-25 NOTE — Progress Notes (Signed)
Occupational Therapy Session Note  Patient Details  Name: Gregory Bautista MRN: [id number] Date of Birth: 1941/01/04  Today's Date: 01/25/2020 OT Individual Time: 0700-0810 OT Individual Time Calculation (min): 70 min    Short Term Goals: Week 2:  OT Short Term Goal 1 (Week 2): Pt will be able to stand from EOB to Rw with CGA to prepare for LB self care. OT Short Term Goal 2 (Week 2): Pt will transfer to toilet with min A. OT Short Term Goal 3 (Week 2): Pt will complete toileting with min A. OT Short Term Goal 4 (Week 2): Pt will don pants with min a. OT Short Term Goal 5 (Week 2): Pt will be able to tolerate standing for 5 min with RW with CGA.  Skilled Therapeutic Interventions/Progress Updates:    Pt sitting in recliner eating breakfast upon arrival. Pt agreeable to "washing up" after eating breakfast. Bathing with sit<>stand from recliner. Pt did not change clothing this morning. Pt requires more then a reasonable amount of time to initiate and complete tasks. Pt with slightly delayed response to questions/commands. Sit<>stand from recliner with CGA. Standing balance with CGA. Pt remained seated in recliner with all needs within reach and belt alarm activated. See Care Tool for assist levels.   Therapy Documentation Precautions:  Precautions Precautions: Back,Fall Precaution Comments: reviewed precautions Restrictions Weight Bearing Restrictions: No Pain:  Pt c/o 3/10 pain L upper thoracic area, axillary, and lateral upper thoracic   Therapy/Group: Individual Therapy  Leroy Libman 01/25/2020, 8:15 AM

## 2020-01-25 NOTE — Progress Notes (Signed)
Patient ID: Gregory Bautista, male   DOB: [birth year], 80 y.o.   MRN: [id number] Packet for [facility] at desk RN aware 3:00 pick up

## 2020-01-25 NOTE — Progress Notes (Signed)
Informed by [facility] that patient was late to his appointment and missed his scheduled radiation treatment. Per MD order to remove foley contingent upon receiving radiation. Radiation not completed, therefore RN will not remove foley per order. Oncoming RN made aware.

## 2020-01-25 NOTE — Progress Notes (Signed)
Physical Therapy Session Note  Patient Details  Name: Gregory Bautista MRN: [id number] Date of Birth: Dec 28, 1940  Today's Date: 01/25/2020 PT Individual Time: 0900-1015 PT Individual Time Calculation (min): 75 min   Short Term Goals: Week 2:  PT Short Term Goal 1 (Week 2): pt to demonstrate ambulation with LRAD for 150' CGA PT Short Term Goal 2 (Week 2): pt to demonstrate functional transfers with LRAD at [facility] consistently PT Short Term Goal 3 (Week 2): pt to demonstrate dynamic standing balance at CGA with LRAD PT Short Term Goal 4 (Week 2): initiate stair training  Skilled Therapeutic Interventions/Progress Updates:    Pt received seated in bed, agreeable to PT session. Pt reports ongoing burning pain in L upper chest region, reports being premedicated prior to start of therapy session as well as utilizing lidocaine patches for pain management. Semi-reclined to sitting EOB at Supervision level with increased time and use of hospital bed features needed. Sit to stand with min A to RW from low surface of bed. Stand pivot transfer to w/c with RW and CGA. Ambulation x 60 ft with RW and min A for balance, decreased gait speed and flexed head/neck and trunk as well as elevated shoulders, cues for upright posture. Ambulation 2 x 30 ft with RW and min A for balance, pt requiring seated rest break after each bout of ambulation due to fatigue. Pt reports ongoing tightness in upper back and shoulder region. Demonstrated how to perform shoulder rolls forwards and backwards, pt able to perform exercise with manual and verbal cueing x 10 reps each direction. Seated scap squeezes x 10 reps with tactile and verbal cues for correct exercise performance. Pt reports some relief of tightness following exercises. Standing mini-squats x 10 reps with RW and mod manual and verbal cueing for correct exercise performance. Standing alt L/R marches with RW and CGA for balance x 10 reps B. Pt initially on 1L O2 at rest, SpO2 at  92% at rest but decreases to 85% with activity. Increased patient to 2L O2 with activity and SpO2 remains at 90% (+). Pt requests to return to bed at end of session. Sit to supine Supervision with use of bedrails. Pt left semi-reclined in bed with needs in reach, bed alarm in place at end of session.  Therapy Documentation Precautions:  Precautions Precautions: Back,Fall Precaution Comments: reviewed precautions Restrictions Weight Bearing Restrictions: No   Therapy/Group: Individual Therapy   Excell Seltzer, PT, DPT  01/25/2020, 11:47 AM

## 2020-01-25 NOTE — Progress Notes (Signed)
Patient back to floor via [facility]. No complaints.

## 2020-01-25 NOTE — Patient Care Conference (Incomplete)
Inpatient RehabilitationTeam Conference and Plan of Care Update Date: 01/25/2020   Time: 10:58 PM    Patient Name: Gregory Bautista      Medical Record Number: [id number]  Date of Birth: 09-23-1940 Sex: Male         Room/Bed: 4M01C/4M01C-01 Payor Info: Payor: [employer] MEDICARE / Plan: [facility] MEDICARE / Product Type: *No Product type* /    Admit Date/Time:  01/12/2020  3:14 PM  Primary Diagnosis:  Thoracic myelopathy  Hospital Problems: Principal Problem:   Thoracic myelopathy Active Problems:   Hyperkalemia   Neurogenic bladder   Sleep disturbance   Postoperative pain   Drug-induced constipation   Neurogenic bowel    Expected Discharge Date: Expected Discharge Date: 02/03/20  Team Members Present:       Current Status/Progress Goal Weekly Team Focus  Bowel/Bladder   Bowel program, Foley  regain B/B function  Daily bowel program, foley care   Swallow/Nutrition/ Hydration             ADL's             Mobility   Supervision bed mobility, min A to CGA sit to stand, CGA stand pivot transfer with RW, gait up to 134' with RW min A, stairs mod A  supervision overall  endurance, gait, stairs, LE strengthening   Communication             Safety/Cognition/ Behavioral Observations            Pain   No pain  pain<3  Assess pain Qshift and PRN   Skin   Upper back insicion  proper healing, no infection  Assess skin Qshift     Discharge Planning:      Team Discussion: *** Patient on target to meet rehab goals: {IP REHAB YES/NO WITH NMMHWKGSU:11031}  *See Care Plan and progress notes for long and short-term goals.   Revisions to Treatment Plan:  ***  Teaching Needs: ***  Current Barriers to Discharge: {BARRIERS TO RXYVOPFYT:24462}  Possible Resolutions to Barriers: ***     Medical Summary               I attest that I was present, lead the team conference, and concur with the assessment and plan of the team.   Celesta Aver 01/25/2020,  10:58 PM

## 2020-01-25 NOTE — Progress Notes (Signed)
[place] PHYSICAL MEDICINE & REHABILITATION PROGRESS NOTE   Subjective/Complaints:  Pt reports "not too bad"-  slept well; and LBM 2 nights ago- does not feel constipated.  No issues otherwise.   Ca up to 11/2- up from 10.2- not symptomatic- will recheck.     ROS:  Pt denies SOB, abd pain, CP, N/V/C/D, and vision changes   Objective:   No results found. Recent Labs    01/25/20 0553  WBC 5.8  HGB 11.6*  HCT 35.8*  PLT 290   Recent Labs    01/25/20 0553  NA 136  K 4.4  CL 100  CO2 27  GLUCOSE 96  BUN 18  CREATININE 0.86  CALCIUM 11.2*    Intake/Output Summary (Last 24 hours) at 01/25/2020 0849 Last data filed at 01/25/2020 0800 Gross per 24 hour  Intake 720 ml  Output 1050 ml  Net -330 ml    Physical Exam: Vital Signs Blood pressure 116/72, pulse (!) 103, temperature 98.8 F (37.1 C), resp. rate 17, height 5\' 9"  (1.753 m), weight 68.9 kg, SpO2 97 %. Constitutional: sititng up in bedside chair, appropriate, NAD HENT: Normocephalic.  Atraumatic. Eyes: EOMI. No discharge. Cardiovascular: borderline tachycardia-  Respiratory: CTA B/L- no W/R/R- good air movement (+) O2 by [place] 2L GI: Soft, NT, ND, (+)BS  Skin: Warm and dry.  Intact. Psych: flat affect;  Musc: No edema in extremities.  No tenderness in extremities. Neuro: Alert but decreased memory.  Motor: Bilateral upper extremities: Grossly 5/5 proximal distal Bilateral lower extremities: Grossly 4+/5 proximal to distal, stable  Assessment/Plan: 1. Functional deficits which require 3+ hours per day of interdisciplinary therapy in a comprehensive inpatient rehab setting.  Physiatrist is providing close team supervision and 24 hour management of active medical problems listed below.  Physiatrist and rehab team continue to assess barriers to discharge/monitor patient progress toward functional and medical goals  Care Tool:  Bathing    Body parts bathed by patient: Right arm,Left arm,Chest,Abdomen,Right  upper leg,Left upper leg,Face,Front perineal area   Body parts bathed by helper: Buttocks,Right lower leg,Left lower leg Body parts n/a: Front perineal area,Buttocks,Right lower leg,Left lower leg   Bathing assist Assist Level: Minimal Assistance - Patient > 75%     Upper Body Dressing/Undressing Upper body dressing   What is the patient wearing?: Pull over shirt    Upper body assist Assist Level: Set up assist    Lower Body Dressing/Undressing Lower body dressing      What is the patient wearing?: Incontinence brief     Lower body assist Assist for lower body dressing: Total Assistance - Patient < 25%     Toileting Toileting    Toileting assist Assist for toileting: Moderate Assistance - Patient 50 - 74% Assistive Device Comment: BSC   Transfers Chair/bed transfer  Transfers assist     Chair/bed transfer assist level: Minimal Assistance - Patient > 75%     Locomotion Ambulation   Ambulation assist      Assist level: Minimal Assistance - Patient > 75% Assistive device: Walker-rolling Max distance: 134'   Walk 10 feet activity   Assist     Assist level: Minimal Assistance - Patient > 75% Assistive device: Walker-rolling   Walk 50 feet activity   Assist Walk 50 feet with 2 turns activity did not occur: Safety/medical concerns  Assist level: Minimal Assistance - Patient > 75% Assistive device: Walker-rolling    Walk 150 feet activity   Assist Walk 150 feet activity did not occur:  Safety/medical concerns         Walk 10 feet on uneven surface  activity   Assist Walk 10 feet on uneven surfaces activity did not occur: Safety/medical concerns         Wheelchair     Assist Will patient use wheelchair at discharge?: Yes (Per PT long term goals) Type of Wheelchair: Manual    Wheelchair assist level: Supervision/Verbal cueing Max wheelchair distance: 100'    Wheelchair 50 feet with 2 turns activity    Assist         Assist Level: Supervision/Verbal cueing   Wheelchair 150 feet activity     Assist      Assist Level: Moderate Assistance - Patient 50 - 74%   Blood pressure 116/72, pulse (!) 103, temperature 98.8 F (37.1 C), resp. rate 17, height 5\' 9"  (1.753 m), weight 68.9 kg, SpO2 97 %.  Medical Problem List and Plan: 1.  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  Antithrombotics: -DVT/anticoagulation:   Heparin.  Mechanical: Sequential compression devices, below knee Bilateral lower extremities   Dopplers negative for DVT  -antiplatelet therapy: N/A 3. Pain Management:    Decreased oxycodone to q4H prn.  DC gabapentin since causing constipation  Added Cymbalta 30 mg daily.   1/3- will increase Duloxetine to 60 mg nightly- for nerve pain 4. Mood: LCSW to follow for evaluation and support.              -antipsychotic agents: N/a 5. Neuropsych: This patient is capable of making decisions on his own behalf.  Appreciate neuropsych 6. Skin/Wound Care: Routine pressure relief measures.  7. Fluids/Electrolytes/Nutrition: Monitor I/Os.             Added juven to promote healing.  8. Neurogenic bladder: Foley in place due to urinary retention/UTI, with plans to remove on Monday  Flomax daily initiated on 12/30, monitor blood pressure closely  Florinef 0.1 mg daily initiated to assist with low BP.   Improving on 1/2  1/3- will d/c foley this afternoon, after radiation 9.  Acute renal failure: Resolved  Creatinine 0.86 on 12/27   Conitnue IVF 10. Acute blood loss anemia:   Hemoglobin 12.4 on 12/28  Continue to monitor 11. Metastatic Lung AdenoCA:  On dulera--has been on supplemental oxygen 12. Kleb/Proteus UTI: Completed course of Cipro 13. Resting tachycardia: 12/21 sinus tach RBBB on  EKG   Borderline on 1/2,  no changes today 14. Neurogenic bowel:   Responds to dig stim, added lidocaine jelly  Colace increased per pt request Colace to 200 mg daily, increased to 3 times daily  Improving overall  1/3- LBM 2 nights ago- con't to monitor and if no BM by tomorrow, will give Sorbitol.  14. Hypotension-   Severe orthostasis, negative on 12/31  Relatively controlled on 1/2  TEDs/abdominal binder for support.   See #8 15.  Sleep disturbance: Scheduled melatonin at nights.   Trazodone 100  1/3- sleeping well- con't regimen 17. Tenting/mild dehydration- will push 6 cups/water daily minimum 18. Hyperkalemia  Lokelma ordered on 12/23  Potassium 4.6 on 12/27 19. Pancoast tumor/ metastatic lung cancer:   Radiation to begin on 1/3 and to see Dr Julien Nordmann 1/3 at 2:15 pm 20. Dysphagia per pt  12/30- ordered SLP eval and treat  1/3- placed on D3 thin  diet  LOS: 13 days A FACE TO FACE EVALUATION WAS PERFORMED  Gregory Bautista 01/25/2020, 8:49 AM

## 2020-01-25 NOTE — Progress Notes (Signed)
Patient leaving floor with [facility] for radiation therapy.

## 2020-01-25 NOTE — Progress Notes (Signed)
Dig stem administer with small results; small brown clumps, Patient tolerated well.

## 2020-01-25 NOTE — Progress Notes (Signed)
RN has rounded on patient q 15 minutes since 1815. Dinner delivered at [employer]. RN to perform bowel program and remove foley catheter per [place] note. Patient has been eating entire time. Will have to defer bowel program to oncoming RN due to time constraints.

## 2020-01-26 ENCOUNTER — Inpatient Hospital Stay (HOSPITAL_COMMUNITY): Payer: Medicare Other | Admitting: Speech Pathology

## 2020-01-26 ENCOUNTER — Inpatient Hospital Stay (HOSPITAL_COMMUNITY): Payer: Medicare Other

## 2020-01-26 ENCOUNTER — Inpatient Hospital Stay (HOSPITAL_COMMUNITY): Payer: Medicare Other | Admitting: Physical Therapy

## 2020-01-26 ENCOUNTER — Ambulatory Visit
Admission: RE | Admit: 2020-01-26 | Discharge: 2020-01-26 | Disposition: A | Discharge status: Home / Self Care | Payer: Medicare Other | Source: Ambulatory Visit | Attending: Radiation Oncology | Admitting: Radiation Oncology

## 2020-01-26 DIAGNOSIS — R918 Other nonspecific abnormal finding of lung field: Secondary | ICD-10-CM | POA: Diagnosis not present

## 2020-01-26 LAB — COMPREHENSIVE METABOLIC PANEL
ALT: 49 U/L — ABNORMAL HIGH (ref 0–44)
AST: 39 U/L (ref 15–41)
Albumin: 2.4 g/dL — ABNORMAL LOW (ref 3.5–5.0)
Alkaline Phosphatase: 69 U/L (ref 38–126)
Anion gap: 8 (ref 5–15)
BUN: 22 mg/dL (ref 8–23)
CO2: 28 mmol/L (ref 22–32)
Calcium: 11 mg/dL — ABNORMAL HIGH (ref 8.9–10.3)
Chloride: 98 mmol/L (ref 98–111)
Creatinine, Ser: 0.79 mg/dL (ref 0.61–1.24)
GFR, Estimated: >60 mL/min (ref >60)
Glucose, Bld: 95 mg/dL (ref 70–99)
Potassium: 4.4 mmol/L (ref 3.5–5.1)
Sodium: 134 mmol/L — ABNORMAL LOW (ref 135–145)
Total Bilirubin: 0.5 mg/dL (ref 0.3–1.2)
Total Protein: 7 g/dL (ref 6.5–8.1)

## 2020-01-26 NOTE — Progress Notes (Signed)
Physical Therapy Session Note  Patient Details  Name: Gregory Bautista MRN: [id number] Date of Birth: 28-May-1940  Today's Date: 01/26/2020 PT Individual Time: 1130-1200; 2820-6015 PT Individual Time Calculation (min): 30 min and 25 min  Short Term Goals: Week 2:  PT Short Term Goal 1 (Week 2): pt to demonstrate ambulation with LRAD for 150' CGA PT Short Term Goal 2 (Week 2): pt to demonstrate functional transfers with LRAD at [facility] consistently PT Short Term Goal 3 (Week 2): pt to demonstrate dynamic standing balance at CGA with LRAD PT Short Term Goal 4 (Week 2): initiate stair training  Skilled Therapeutic Interventions/Progress Updates:    Session 1: Pt received semi-reclined in bed, agreeable to PT session. No complaints of pain. Supine to sitting EOB with Supervision with HOB elevated. Sit to stand with CGA to RW throughout session. Stand pivot transfer to w/c with RW and CGA. Session focus on stair navigation. Ascend/descend 3 x 4" stairs with 2 handrails and min A for balance. Progression to performing stairs laterally with L handrail and min A, 4 x 3" stairs. Pt reports feeling significantly fatigued following stair navigation. Pt on 1L O2 throughout session, SpO2 remains at 91% (+) with activity. Pt left seated in w/c in room with needs in reach, quick release belt and chair alarm in place at end of session.  Session 2: Pt received seated in w/c in room eating lunch, agreeable to PT session. Pt reports ongoing pain in L chest and axillary region described as a burning sensation. Sit to stand with CGA to RW, stand pivot transfer to standard lightweight w/c with RW and CGA. Session focus on w/c mobility in standard lightweight w/c vs high-back w/c. Manual w/c propulsion x 100 ft with use of BUE at Supervision level. Education with patient about use of w/c vs RW for mobility depending on fatigue and endurance level. Pt requests to return to high-back w/c at end of session for improved comfort.  Stand pivot transfer with RW and CGA. Pt on 1L O2 throughout session, SpO2 remains at 92% (+) with activity this session. Pt left seated in w/c in room with needs in reach, quick release belt and chair alarm in place at end of session.  Therapy Documentation Precautions:  Precautions Precautions: Back,Fall Precaution Comments: reviewed precautions Restrictions Weight Bearing Restrictions: No   Therapy/Group: Individual Therapy   Excell Seltzer, PT, DPT  01/26/2020, 2:27 PM

## 2020-01-26 NOTE — Progress Notes (Signed)
Occupational Therapy Session Note  Patient Details  Name: Gregory Bautista MRN: [id number] Date of Birth: 31-Dec-1940  Today's Date: 01/26/2020 OT Individual Time: 3235-5732 OT Individual Time Calculation (min): 45 min  and Today's Date: 01/26/2020 OT Missed Time: 15 Minutes Missed Time Reason: Pain   Short Term Goals: Week 2:  OT Short Term Goal 1 (Week 2): Pt will be able to stand from EOB to Rw with CGA to prepare for LB self care. OT Short Term Goal 2 (Week 2): Pt will transfer to toilet with min A. OT Short Term Goal 3 (Week 2): Pt will complete toileting with min A. OT Short Term Goal 4 (Week 2): Pt will don pants with min a. OT Short Term Goal 5 (Week 2): Pt will be able to tolerate standing for 5 min with RW with CGA.  Skilled Therapeutic Interventions/Progress Updates:    Pt resting in bed upon arrival finsihing breakfast and drinking Ensure. Pt appeared to he "holding" Ensure in his mouth for extended period of time before swallowing. Pt able to swallow without difficulty when provided verbal cues. Pt agreeable to getting OOB for bathing/dressing at sink. Supine>sit EOB with HOB elevated with supervision. Stand pivot transfer with RW-CGA. Pt completed was face and cleaning dentures with more then a reasonable amount of time. Pt with delayed initiation and with transitions within tasks or between tasks. After initiating UB bathing tasks, pt c/o increased "burning" in upper L quadrant of trunk and axillary. Pt stated he needed to rest and returned to bed. Sit>supine with min A for BLE management. Pt remained in bed with all needs withn reach and bed alarm activated.   Therapy Documentation Precautions:  Precautions Precautions: Back,Fall Precaution Comments: reviewed precautions Restrictions Weight Bearing Restrictions: No General: General OT Amount of Missed Time: 15 Minutes Pain: Pt c/o increased "burning" L upper thoracic quadrant and lateral upper thoracic/axillary;  repositioned back in bed. Pt states that "sitting up" makes the pain worse   Therapy/Group: Individual Therapy  Leroy Libman 01/26/2020, 9:58 AM

## 2020-01-26 NOTE — Progress Notes (Signed)
Bowel program not complete on day shift, per report patient wanted to stay up in chair after dinner. Patient refused bowel program x2. Patient educated.

## 2020-01-26 NOTE — Progress Notes (Signed)
Speech Language Pathology Daily Session Note  Patient Details  Name: Gregory Bautista MRN: [id number] Date of Birth: Nov 06, 1940  Today's Date: 01/26/2020 SLP Individual Time: 1031-1058 SLP Individual Time Calculation (min): 27 min  Short Term Goals: Week 1: SLP Short Term Goal 1 (Week 1): Pt will consume dys 3 textures and thin liquids with mod I use of swallowing precautions SLP Short Term Goal 2 (Week 1): Pt will consume therapeutic trials of regular textures to continue working towards diet progression with mod I use of swallowing precautions and no overt s/s of aspiration  Skilled Therapeutic Interventions: Pt was seen for skilled ST targeting dysphagia goals. In functional conversation regarding swallowing, pt able to express he is having difficulty with meals, but unable to elaborate on any specific deficits or sensations when eating/drinking. Therefore, SLP facilitated session with a Dys 3 texture and thin liquid snack. No overt s/sx aspiration observed, however extended time for mastication of solids was noted. He also appeared to have difficulty with swallow initiation, and took multiple swallows to consume individual boluses. When pt added a sip of water to his solid bolus, swallow initiation appeared to occur in a more timely manner, and pt reported it became easier. SLP also provided education regardng selection of POs that are "easier" for pt to eat without further restricting textures of his diet. He continues to benefit from skilled ST interventions to maximize his diet safety and efficiency while inpatient. Pt left laying semi-reclined in bed with alarm set and need wishing reach. Continue per current plan of care.     Pain Pain Assessment Pain Scale: Faces Pain Score: 0-No pain Faces Pain Scale: No hurt  Therapy/Group: Individual Therapy  Arbutus Leas 01/26/2020, 7:30 AM

## 2020-01-26 NOTE — Progress Notes (Signed)
Patient ID: Gregory Bautista, male   DOB: 03/29/1940, 80 y.o.   MRN: [id number]  Spoke with daughter-Toni via telephone and pt to discuss team conference progress this week and target discharge still 1/12. She reports at this time their is no family member who can provide 24/7 supervision and not here in town. She is looking at options. Have let her know his insurance probably will not cover SNF since he came here to rehab. Did inform her of MD peer to peer and family appeal if it comes to this. She will continue to discuss with family members.Pt would like to go to a home like environment with a family member, but may not be feasible and has 12-14 rad treatments. Continue to work on best plan for pt

## 2020-01-26 NOTE — Patient Care Conference (Signed)
Inpatient RehabilitationTeam Conference and Plan of Care Update Date: 01/26/2020   Time: 11:02 AM    Patient Name: Gregory Bautista      Medical Record Number: [id number]  Date of Birth: 09/21/1940 Sex: Male         Room/Bed: 4M01C/4M01C-01 Payor Info: Payor: [employer] MEDICARE / Plan: [facility] MEDICARE / Product Type: *No Product type* /    Admit Date/Time:  01/12/2020  3:14 PM  Primary Diagnosis:  Thoracic myelopathy  Hospital Problems: Principal Problem:   Thoracic myelopathy Active Problems:   Hyperkalemia   Neurogenic bladder   Sleep disturbance   Postoperative pain   Drug-induced constipation   Neurogenic bowel    Expected Discharge Date: Expected Discharge Date: 02/03/20  Team Members Present: Physician leading conference: Dr. Courtney Heys Care Coodinator Present: Dorien Chihuahua, RN, BSN, CRRN;Becky Dupree, LCSW Nurse Present: Pamella Pert) [place], LPN PT Present: Excell Seltzer, PT OT Present: Roanna Epley, COTA SLP Present: Jettie Booze, CF-SLP PPS Coordinator present : Gunnar Fusi, Novella Olive, PT     Current Status/Progress Goal Weekly Team Focus  Bowel/Bladder   Bowel program, Foley  regain B/B function  Daily bowel program, foley care   Swallow/Nutrition/ Hydration   Dys 3 textures, thin liquids  Mod I  trials regular textures, tolerance current diet, education/carryover of strategies   ADL's   bathing-min A; UB dressing-supervision; LB dressing-min/mod A; sit<>stand and functional transfers CGA/min A; toileting-min A  supervision overall  activity tolerance, BADL training, functional transfers, education, safety awareness   Mobility   Supervision bed mobility, min A to CGA sit to stand, CGA stand pivot transfer with RW, gait up to 134' with RW min A, stairs mod A  supervision overall  endurance, gait, stairs, LE strengthening   Communication             Safety/Cognition/ Behavioral Observations            Pain   No pain  pain<3  Assess  pain Qshift and PRN   Skin   Upper back insicion  proper healing, no infection  Assess skin Qshift     Discharge Planning:  Family coming up with a plan for pt, aware he will require 24/7 supervision. If SNF decided upon insurance may not cover and will not take with rad tx   Team Discussion: Radiation therapy scheduled for this afternoon. Hypercalcemia monitored per MD. Foley to be removed today and constipation addressed. Progress limited by fatigue and endurance with limited initiation.  Patient on target to meet rehab goals: Slow progress noted. Currently min assist to [facility] for ADLs  and supervision goals set for discharge. Dysphagia 2 thin diet tolerated with trouble initiating swallows/reflux.  Patient reports feels like the food is going backwards when he swallows; reflux related vs swallowing.   *See Care Plan and progress notes for long and short-term goals.   Revisions to Treatment Plan:  Encourage out of bed; tolerated sitting up some with burning pain in left upper quad/trunk- axillary region. Encourage pain medications as available. Provided strategies for food selection for esophageal protection;  Teaching Needs: Transfers , toileting, food selection, energy conservation tips, medications, etc.  Current Barriers to Discharge: Decreased caregiver support, Behavior and Nutritional means  Possible Resolutions to Barriers: Family conference to review 24/7 care recommended/providers Family education     Medical Summary Current Status: Didn't get radiation due to timing of [facility]- to start today- no pain- O2 in place- some constipation- foley out today to see if  can void  Barriers to Discharge: Pending chemo/radiation;Medical stability;Home enviroment access/layout;Decreased family/caregiver support;Weight;Weight bearing restrictions;Neurogenic Bowel & Bladder;Incontinence;New oxygen;Nutrition means  Barriers to Discharge Comments: limited by endurance/SCI- goals  Supervision, but with radiation, might not be able to get there; Possible Resolutions to [employer] Focus: foley to come out today- to see if can void- a lot of pain LUE/quadrant-this AM- poor initiation-slow progress with therapy; D3 thin diet- working on swallowing- d/c 1/12   Continued Need for Acute Rehabilitation Level of Care: The patient requires daily medical management by a physician with specialized training in physical medicine and rehabilitation for the following reasons: Direction of a multidisciplinary physical rehabilitation program to maximize functional independence : Yes Medical management of patient stability for increased activity during participation in an intensive rehabilitation regime.: Yes Analysis of laboratory values and/or radiology reports with any subsequent need for medication adjustment and/or medical intervention. : Yes   I attest that I was present, lead the team conference, and concur with the assessment and plan of the team.   Dorien Chihuahua B 01/26/2020, 3:47 PM

## 2020-01-26 NOTE — Progress Notes (Signed)
PHYSICAL MEDICINE & REHABILITATION PROGRESS NOTE   Subjective/Complaints:  Pt reports that thought he received Radiation yesterday, but didn't per nursing note.  As a result, they didn't remove foley- placed orders again to remove his foley.   Got bowel program last night- small clumps of stool.   Pt feels like food going backwards, however not sitting up/OOB as much as should- said it was better after sat up at 90 degrees.    ROS:   Pt denies SOB, abd pain, CP, N/V/C/D, and vision changes   Objective:   No results found. Recent Labs    01/25/20 0553  WBC 5.8  HGB 11.6*  HCT 35.8*  PLT 290   Recent Labs    01/25/20 0553 01/26/20 0457  NA 136 134*  K 4.4 4.4  CL 100 98  CO2 27 28  GLUCOSE 96 95  BUN 18 22  CREATININE 0.86 0.79  CALCIUM 11.2* 11.0*    Intake/Output Summary (Last 24 hours) at 01/26/2020 0902 Last data filed at 01/25/2020 1958 Gross per 24 hour  Intake 180 ml  Output 300 ml  Net -120 ml    Physical Exam: Vital Signs Blood pressure 108/61, pulse (!) 104, temperature 98.7 F (37.1 C), resp. rate 18, height 5\' 9"  (1.753 m), weight 68.9 kg, SpO2 94 %. Constitutional: sitting up in bed- ~ 50-60 degrees- changed to 90 degrees, NAD HENT: Normocephalic.  Atraumatic. Eyes: EOMI. No discharge. Cardiovascular:tachycardic- regular rhythm Respiratory: CTA B/L- no W/R/R- good air movement (+) O2 by [place] 2L GI: Soft, NT, ND, (+)BS  Skin: Warm and dry.  Intact. Psych: flat affect; no change- slightly confused- thought got radiation- did not per nursing.  Musc: No edema in extremities.  No tenderness in extremities. Neuro: Alert but decreased memory.  Motor: Bilateral upper extremities: Grossly 5/5 proximal distal Bilateral lower extremities: Grossly 4+/5 proximal to distal, stable  Assessment/Plan: 1. Functional deficits which require 3+ hours per day of interdisciplinary therapy in a comprehensive inpatient rehab setting.  Physiatrist is  providing close team supervision and 24 hour management of active medical problems listed below.  Physiatrist and rehab team continue to assess barriers to discharge/monitor patient progress toward functional and medical goals  Care Tool:  Bathing    Body parts bathed by patient: Right arm,Left arm,Chest,Abdomen,Right upper leg,Left upper leg,Face,Front perineal area   Body parts bathed by helper: Buttocks,Right lower leg,Left lower leg Body parts n/a: Front perineal area,Buttocks,Right lower leg,Left lower leg   Bathing assist Assist Level: Minimal Assistance - Patient > 75%     Upper Body Dressing/Undressing Upper body dressing   What is the patient wearing?: Pull over shirt    Upper body assist Assist Level: Set up assist    Lower Body Dressing/Undressing Lower body dressing      What is the patient wearing?: Incontinence brief     Lower body assist Assist for lower body dressing: Total Assistance - Patient < 25%     Toileting Toileting    Toileting assist Assist for toileting: Moderate Assistance - Patient 50 - 74% Assistive Device Comment: BSC   Transfers Chair/bed transfer  Transfers assist     Chair/bed transfer assist level: Contact Guard/Touching assist     Locomotion Ambulation   Ambulation assist      Assist level: Minimal Assistance - Patient > 75% Assistive device: Walker-rolling Max distance: 60'   Walk 10 feet activity   Assist     Assist level: Minimal Assistance - Patient > 75%  Assistive device: Walker-rolling   Walk 50 feet activity   Assist Walk 50 feet with 2 turns activity did not occur: Safety/medical concerns  Assist level: Minimal Assistance - Patient > 75% Assistive device: Walker-rolling    Walk 150 feet activity   Assist Walk 150 feet activity did not occur: Safety/medical concerns         Walk 10 feet on uneven surface  activity   Assist Walk 10 feet on uneven surfaces activity did not occur:  Safety/medical concerns         Wheelchair     Assist Will patient use wheelchair at discharge?: Yes (Per PT long term goals) Type of Wheelchair: Manual    Wheelchair assist level: Supervision/Verbal cueing Max wheelchair distance: 100'    Wheelchair 50 feet with 2 turns activity    Assist        Assist Level: Supervision/Verbal cueing   Wheelchair 150 feet activity     Assist      Assist Level: Moderate Assistance - Patient 50 - 74%   Blood pressure 108/61, pulse (!) 104, temperature 98.7 F (37.1 C), resp. rate 18, height 5\' 9"  (1.753 m), weight 68.9 kg, SpO2 94 %.  Medical Problem List and Plan: 1.  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  Antithrombotics: -DVT/anticoagulation:   Heparin.  Mechanical: Sequential compression devices, below knee Bilateral lower extremities   Dopplers negative for DVT  -antiplatelet therapy: N/A 3. Pain Management:    Decreased oxycodone to q4H prn.  DC gabapentin since causing constipation  Added Cymbalta 30 mg daily.   1/3- will increase Duloxetine to 60 mg nightly- for nerve pain  1/4- says pain controlled- con't regimen 4. Mood: LCSW to follow for evaluation and support.              -antipsychotic agents: N/a 5. Neuropsych: This patient is capable of making decisions on his own behalf.  Appreciate neuropsych 6. Skin/Wound Care: Routine pressure relief measures.  7. Fluids/Electrolytes/Nutrition: Monitor I/Os.             Added juven to promote healing.  8. Neurogenic bladder: Foley in place due to urinary retention/UTI, with plans to remove on Monday  Flomax daily initiated on 12/30, monitor blood pressure closely  Florinef 0.1 mg daily initiated to assist with low BP.   Improving on 1/2  1/3- will d/c foley this afternoon, after  radiation  1/4- wasn't removed- will remove today- this AM and ordered I/o caths and PVRs since might not void.  9.  Acute renal failure: Resolved  Creatinine 0.86 on 12/27   Conitnue IVF 10. Acute blood loss anemia:   Hemoglobin 12.4 on 12/28  Continue to monitor 11. Metastatic Lung AdenoCA:  On dulera--has been on supplemental oxygen  1/4- didn't get radiation yesterday- is scheduled ot go back today.  12. Kleb/Proteus UTI: Completed course of Cipro 13. 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  14. Hypotension-   Severe orthostasis, negative on  12/31  Relatively controlled on 1/2  TEDs/abdominal binder for support.   See #8 15.  Sleep disturbance: Scheduled melatonin at nights.   Trazodone 100  1/3- sleeping well- con't regimen 17. Tenting/mild dehydration- will push 6 cups/water daily minimum 18. Hyperkalemia  Lokelma ordered on 12/23  Potassium 4.6 on 12/27 19. Pancoast tumor/ metastatic lung cancer:   Radiation to begin on 1/3 and to see Dr Julien Nordmann 1/3 at 2:15 pm  1/4- didn't get radiation- will make sure scheduled ot go back today 20. Dysphagia per pt  12/30- ordered SLP eval and treat  1/3- placed on D3 thin diet  1/4- is better when sitting up at 90 degrees 21. Hypercalcemia  1/4- is down to 11 from 11.2- will monitor- if goes back up more, will treat.     LOS: 14 days A FACE TO FACE EVALUATION WAS PERFORMED  Jenkins Risdon 01/26/2020, 9:02 AM

## 2020-01-27 ENCOUNTER — Inpatient Hospital Stay (HOSPITAL_COMMUNITY): Payer: Medicare Other

## 2020-01-27 ENCOUNTER — Encounter: Payer: Self-pay | Admitting: *Deleted

## 2020-01-27 ENCOUNTER — Ambulatory Visit
Admission: RE | Admit: 2020-01-27 | Discharge: 2020-01-27 | Disposition: A | Discharge status: Home / Self Care | Payer: Medicare Other | Source: Ambulatory Visit | Attending: Radiation Oncology | Admitting: Radiation Oncology

## 2020-01-27 ENCOUNTER — Inpatient Hospital Stay (HOSPITAL_COMMUNITY): Payer: Medicare Other | Admitting: Physical Therapy

## 2020-01-27 ENCOUNTER — Encounter (HOSPITAL_COMMUNITY): Payer: Self-pay | Admitting: Internal Medicine

## 2020-01-27 DIAGNOSIS — R918 Other nonspecific abnormal finding of lung field: Secondary | ICD-10-CM | POA: Diagnosis not present

## 2020-01-27 MED ORDER — MIDODRINE HCL 5 MG PO TABS
5.0000 mg | ORAL_TABLET | Freq: Three times a day (TID) | ORAL | Status: DC
  Administered 2020-01-27 – 2020-01-29 (×6): 5 mg via ORAL
  Filled 2020-01-27 (×6): qty 1

## 2020-01-27 NOTE — Progress Notes (Signed)
Physical Therapy Note  Patient Details  Name: Gregory Bautista MRN: [id number] Date of Birth: [birth year] Today's Date: 01/27/2020    Recommending the following equipment to increase functional independence with mobility: ~ Standard lightweight wheelchair 18x18 with backrest cushion   Patient measurements:    Hip Width: 16"    Seat Depth (-2"): 22"    Back Height: 26"   W/c cushion: 18x18 foam cushion    ~ RW   Excell Seltzer, PT, DPT  01/27/2020, 5:24 PM

## 2020-01-27 NOTE — Progress Notes (Signed)
Patient ID: Gregory Bautista, male   DOB: 07/27/1940, 80 y.o.   MRN: [id number]  SW informed pt assigned RN on packet for [facility] transport at 2pm radiation treatment at front desk.  *SW received updates from nursing transportation has not arrived. SW spoke with [facility] [phone]) to inquire about arrival. ETA atleast 20 minutes. SW left message for [facility] ([phone]) to inform on pt possible late arrival as waiting on transportation to arrive.   [name], MSW, [place] Office: [phone] Cell: [phone] Fax: [phone]

## 2020-01-27 NOTE — Progress Notes (Signed)
Occupational Therapy Weekly Progress Note  Patient Details  Name: Gregory Bautista MRN: [id number] Date of Birth: Mar 12, 1940  Beginning of progress report period: January 20, 2020 End of progress report period: January 27, 2020  Patient has met 4 of 5 short term goals.  Pt is making slow but steady progress with functional transfers, standing balance, BADLs, and endurance. Pt continues to fatigue easily and requires multiple rest breaks during functional tasks. Pt is slow to initiate functional tasks and slow to transition from one task to another. Pt requires CGA/min A for functional transfers, including toilet transfers. Pt completes bathing tasks and LB dressing tasks with min A. Pt requires min verbal cues for safety awareness and sequencing during functional transfers. Pt started radiation treatments on 01/26/2020. Discharge plans are currently uncertain and family is developing a plan for ongoing supervision/care after discharge.  Patient continues to demonstrate the following deficits: muscle weakness, decreased cardiorespiratoy endurance and decreased oxygen support, decreased initiation, decreased problem solving, decreased safety awareness and delayed processing and decreased standing balance, decreased postural control and decreased balance strategies and therefore will continue to benefit from skilled OT intervention to enhance overall performance with BADL and iADL.  Patient progressing toward long term goals..  Continue plan of care.  OT Short Term Goals Week 2:  OT Short Term Goal 1 (Week 2): Pt will be able to stand from EOB to Rw with CGA to prepare for LB self care. OT Short Term Goal 1 - Progress (Week 2): Met OT Short Term Goal 2 (Week 2): Pt will transfer to toilet with min A. OT Short Term Goal 2 - Progress (Week 2): Met OT Short Term Goal 3 (Week 2): Pt will complete toileting with min A. OT Short Term Goal 3 - Progress (Week 2): Met OT Short Term Goal 4 (Week 2): Pt will  don pants with min a. OT Short Term Goal 4 - Progress (Week 2): Progressing toward goal OT Short Term Goal 5 (Week 2): Pt will be able to tolerate standing for 5 min with RW with CGA. OT Short Term Goal 5 - Progress (Week 2): Met Week 3:  OT Short Term Goal 1 (Week 3): STG=LTG secondary to ELOS      Leroy Libman 01/27/2020, 6:34 AM

## 2020-01-27 NOTE — Progress Notes (Signed)
I followed up on Mr. Lawhorn rehab status.  He was scheduled to see Dr. Julien Nordmann twice but missed his appts due to his hospitalization.  I called [facility] and spoke to Mr. Colee discharge status.  She states possible discharge on 02/03/20.  I scheduled Mr. Redmann to be scheduled on 1/17 with Cassie PA-C.

## 2020-01-27 NOTE — Progress Notes (Signed)
Physical Therapy Weekly Progress Note  Patient Details  Name: Gregory Bautista MRN: [id number] Date of Birth: 03-13-1940  Beginning of progress report period: January 21, 2020 End of progress report period: January 27, 2020  Today's Date: 01/27/2020  Patient has met 3 of 4 short term goals.  Pt is making slow but steady progress towards therapy goals. He is at Supervision level for bed mobility, CGA for transfers with RW, can perform gait up to 134 ft with RW and CGA but does remain limited by his endurance and frequently fatigues with shorter distances, can navigate 4 x 3" stairs with 1 handrail and min A for balance, and is at Supervision level for w/c mobility. Pt remains limited by decreased initiation at times as well as overall impaired endurance and therapy tolerance.  Patient continues to demonstrate the following deficits muscle weakness, decreased cardiorespiratoy endurance and decreased oxygen support, decreased initiation, decreased problem solving, decreased safety awareness and decreased memory and decreased standing balance, decreased postural control and decreased balance strategies and therefore will continue to benefit from skilled PT intervention to increase functional independence with mobility.  Patient progressing toward long term goals..  Continue plan of care.  PT Short Term Goals Week 2:  PT Short Term Goal 1 (Week 2): pt to demonstrate ambulation with LRAD for 150' CGA PT Short Term Goal 1 - Progress (Week 2): Progressing toward goal PT Short Term Goal 2 (Week 2): pt to demonstrate functional transfers with LRAD at [facility] consistently PT Short Term Goal 2 - Progress (Week 2): Met PT Short Term Goal 3 (Week 2): pt to demonstrate dynamic standing balance at CGA with LRAD PT Short Term Goal 3 - Progress (Week 2): Met PT Short Term Goal 4 (Week 2): initiate stair training PT Short Term Goal 4 - Progress (Week 2): Met Week 3:  PT Short Term Goal 1 (Week 3): =LTG due to  ELOS   Therapy Documentation Precautions:  Precautions Precautions: Back,Fall Precaution Comments: reviewed precautions Restrictions Weight Bearing Restrictions: No   Therapy/Group: Individual Therapy   Excell Seltzer, PT, DPT 01/27/2020, 12:09 PM

## 2020-01-27 NOTE — Progress Notes (Signed)
Physical Therapy Session Note  Patient Details  Name: Gregory Bautista MRN: [id number] Date of Birth: Oct 25, 1940  Today's Date: 01/27/2020 PT Individual Time: 1115-1200; 2423-5361 PT Individual Time Calculation (min): 45 min and 25 min  Short Term Goals: Week 2:  PT Short Term Goal 1 (Week 2): pt to demonstrate ambulation with LRAD for 150' CGA PT Short Term Goal 2 (Week 2): pt to demonstrate functional transfers with LRAD at [facility] consistently PT Short Term Goal 3 (Week 2): pt to demonstrate dynamic standing balance at CGA with LRAD PT Short Term Goal 4 (Week 2): initiate stair training  Skilled Therapeutic Interventions/Progress Updates:    Session 1: Pt received semi-reclined in bed, agreeable to PT session. No complaints of pain. Semi-reclined to sitting EOB at Supervision level with use of bedrail and HOB slightly elevated. Sit to stand with CGA to RW throughout session. Stand pivot transfers with RW and CGA throughout session. Manual w/c propulsion 2 x 100 ft with use of BUE at Supervision level with cues for body mechanics. Sit to/from supine at Supervision level on real bed in therapy apartment to simulate home environment. Pt does require use of headboard to assist with pulling himself back up to sitting position, reports he has a similar headboard on his bed at home. Sit to stand x 5 reps to RW from 20" height surface at CGA level, cues for safe UE placement during transfer. Pt reports urge to use the bathroom. Toilet transfer with CGA and RW, pt independent for clothing management. Pt left seated on elevated BSC over toilet in bathroom with call button in reach, NT notified of pt's location. Pt on 2L O2 via  throughout session, SpO2 at 92% and higher with activity.  Session 2: Pt received seated in w/c in room, agreeable to PT session. No complaints of pain. Pt still eating his lunch but agreeable to resume lunch following therapy session. Dependent transport via w/c to/from therapy gym  for time conservation. Sit to stand with CGA to stair rails. Ascend/descend 4 x 6" stairs with 2 handrails and min A for balance, step-to gait pattern. Ascend/descend 4 x 6" stairs laterally with R handrail and min A to simulate home environment. Pt reports feeling significantly fatigued following stair navigation. Pt on 2L O2 throughout session, SpO2 remains at 94% and higher with activity. Pt returned to room at end of session, left seated in w/c in room with needs in reach, quick release belt and chair alarm in place, setup to finish eating his lunch.  Therapy Documentation Precautions:  Precautions Precautions: Back,Fall Precaution Comments: reviewed precautions Restrictions Weight Bearing Restrictions: No    Therapy/Group: Individual Therapy   Excell Seltzer, PT, DPT  01/27/2020, 12:08 PM

## 2020-01-27 NOTE — Progress Notes (Signed)
Occupational Therapy Session Note  Patient Details  Name: Gregory Bautista MRN: [id number] Date of Birth: Apr 08, 1940  Today's Date: 01/27/2020 OT Individual Time: 8527-7824 OT Individual Time Calculation (min): 70 min    Short Term Goals: Week 3:  OT Short Term Goal 1 (Week 3): STG=LTG secondary to ELOS  Skilled Therapeutic Interventions/Progress Updates:    Pt sitting in w/c upon arrival eating breakfast. Pt requires extra time for eating, particularly with swallowing. Pt commented that swallowing is not any easier but he is making an effort to eat more because he knows it is important. Pt commented that radiation went well yesterday. OT intervention with focus on bathing/dressing with sit<>stand from w/c at sink. Sit<>stand with supervision and CGA for standing. Pt completes all tasks with supervision/CGA but requires extra time to initiate and complete. Pt requires rest breaks throughout tasks. O2 87% on RA and 93% on 1L O2. HR 133. Pt amb 5' with RW to sit EOB. Sit<>supine with supervision. Pt remained in bed with all needs within reach and bed alarm activated.   Therapy Documentation Precautions:  Precautions Precautions: Back,Fall Precaution Comments: reviewed precautions Restrictions Weight Bearing Restrictions: No   Pain: Pt c/o increased "burning" in L upper quadrant of thoracic area and axillary; repositioned  Therapy/Group: Individual Therapy  Leroy Libman 01/27/2020, 9:30 AM

## 2020-01-27 NOTE — Progress Notes (Signed)
[place] PHYSICAL MEDICINE & REHABILITATION PROGRESS NOTE   Subjective/Complaints:  Pt reports got radiation therapy yesterday- no side effects so far.  Didn't eat yet this AM.   Pain meds yesterday helpful, but has a lot of pain with therapy- not at rest.     ROS:   Pt denies SOB, abd pain, CP, N/V/C/D, and vision changes   Objective:   No results found. Recent Labs    01/25/20 0553  WBC 5.8  HGB 11.6*  HCT 35.8*  PLT 290   Recent Labs    01/25/20 0553 01/26/20 0457  NA 136 134*  K 4.4 4.4  CL 100 98  CO2 27 28  GLUCOSE 96 95  BUN 18 22  CREATININE 0.86 0.79  CALCIUM 11.2* 11.0*    Intake/Output Summary (Last 24 hours) at 01/27/2020 0754 Last data filed at 01/27/2020 0104 Gross per 24 hour  Intake 120 ml  Output 500 ml  Net -380 ml    Physical Exam: Vital Signs Blood pressure 116/72, pulse 92, temperature 98.6 F (37 C), temperature source Oral, resp. rate 20, height 5\' 9"  (1.753 m), weight 68.9 kg, SpO2 99 %. Constitutional: laying in bed- breakfast at bedside, NAD- good color HENT: Normocephalic.  Atraumatic. Eyes: EOMI. No discharge. Cardiovascular: RRR Respiratory: CTA B/L- no W/R/R- good air movement (+) O2 by Rosman- 2L GI: Soft, NT, ND, (+)BS  Skin: Warm and dry.  Intact. Psych: flat affect Musc: No edema in extremities.  No tenderness in extremities. Neuro: Alert but decreased memory.  Motor: Bilateral upper extremities: Grossly 5/5 proximal distal Bilateral lower extremities: Grossly 4+/5 proximal to distal, stable  Assessment/Plan: 1. Functional deficits which require 3+ hours per day of interdisciplinary therapy in a comprehensive inpatient rehab setting.  Physiatrist is providing close team supervision and 24 hour management of active medical problems listed below.  Physiatrist and rehab team continue to assess barriers to discharge/monitor patient progress toward functional and medical goals  Care Tool:  Bathing    Body parts bathed  by patient: Right arm,Left arm,Chest,Abdomen,Right upper leg,Left upper leg,Face,Front perineal area   Body parts bathed by helper: Buttocks,Right lower leg,Left lower leg Body parts n/a: Front perineal area,Buttocks,Right lower leg,Left lower leg   Bathing assist Assist Level: Minimal Assistance - Patient > 75%     Upper Body Dressing/Undressing Upper body dressing   What is the patient wearing?: Pull over shirt    Upper body assist Assist Level: Set up assist    Lower Body Dressing/Undressing Lower body dressing      What is the patient wearing?: Incontinence brief     Lower body assist Assist for lower body dressing: Total Assistance - Patient < 25%     Toileting Toileting    Toileting assist Assist for toileting: Moderate Assistance - Patient 50 - 74% Assistive Device Comment: BSC   Transfers Chair/bed transfer  Transfers assist     Chair/bed transfer assist level: Contact Guard/Touching assist     Locomotion Ambulation   Ambulation assist      Assist level: Minimal Assistance - Patient > 75% Assistive device: Walker-rolling Max distance: 60'   Walk 10 feet activity   Assist     Assist level: Minimal Assistance - Patient > 75% Assistive device: Walker-rolling   Walk 50 feet activity   Assist Walk 50 feet with 2 turns activity did not occur: Safety/medical concerns  Assist level: Minimal Assistance - Patient > 75% Assistive device: Walker-rolling    Walk 150 feet activity  Assist Walk 150 feet activity did not occur: Safety/medical concerns         Walk 10 feet on uneven surface  activity   Assist Walk 10 feet on uneven surfaces activity did not occur: Safety/medical concerns         Wheelchair     Assist Will patient use wheelchair at discharge?: Yes Type of Wheelchair: Manual    Wheelchair assist level: Supervision/Verbal cueing Max wheelchair distance: 100'    Wheelchair 50 feet with 2 turns  activity    Assist        Assist Level: Supervision/Verbal cueing   Wheelchair 150 feet activity     Assist      Assist Level: Moderate Assistance - Patient 50 - 74%   Blood pressure 116/72, pulse 92, temperature 98.6 F (37 C), temperature source Oral, resp. rate 20, height 5\' 9"  (1.753 m), weight 68.9 kg, SpO2 99 %.  Medical Problem List and Plan: 1.  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  Antithrombotics: -DVT/anticoagulation:   Heparin.  Mechanical: Sequential compression devices, below knee Bilateral lower extremities   Dopplers negative for DVT  -antiplatelet therapy: N/A 3. Pain Management:    Decreased oxycodone to q4H prn.  DC gabapentin since causing constipation  Added Cymbalta 30 mg daily.   1/3- will increase Duloxetine to 60 mg nightly- for nerve pain  1/5- not taking pain meds often- encouraged pt to take pain meds before therapy.  4. Mood: LCSW to follow for evaluation and support.              -antipsychotic agents: N/a 5. Neuropsych: This patient is capable of making decisions on his own behalf.  Appreciate neuropsych 6. Skin/Wound Care: Routine pressure relief measures.  7. Fluids/Electrolytes/Nutrition: Monitor I/Os.             Added juven to promote healing.  8. Neurogenic bladder: Foley in place due to urinary retention/UTI, with plans to remove on Monday  Flomax daily initiated on 12/30, monitor blood pressure closely  Florinef 0.1 mg daily initiated to assist with low BP.   Improving on 1/2  1/3- will d/c foley this afternoon, after radiation  1/4- wasn't removed- will remove today- this AM and ordered I/o caths and PVRs since might not void.  1/5- so far, no voiding- cathing required 100%  9.  Acute renal failure: Resolved  Creatinine 0.86 on 12/27   Conitnue  IVF 10. Acute blood loss anemia:   Hemoglobin 12.4 on 12/28  Continue to monitor 11. Metastatic Lung AdenoCA:  On dulera--has been on supplemental oxygen  1/4- didn't get radiation yesterday- is scheduled ot go back today.  12. Kleb/Proteus UTI: Completed course of Cipro 13. 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  1/5- refused bowel program last night  And as a result had accident this AM.  14. Hypotension-   Severe orthostasis, negative on 12/31  Relatively controlled on 1/2  TEDs/abdominal binder for support.   See #  8 15.  Sleep disturbance: Scheduled melatonin at nights.   Trazodone 100  1/3- sleeping well- con't regimen 17. Tenting/mild dehydration- will push 6 cups/water daily minimum 18. Hyperkalemia  Lokelma ordered on 12/23  Potassium 4.6 on 12/27 19. Pancoast tumor/ metastatic lung cancer:   Radiation to begin on 1/3 and to see Dr Julien Nordmann 1/3 at 2:15 pm  1/4- didn't get radiation- will make sure scheduled to  go back today  1/5- made sure pt went to radiation -his BP was low- but gave him 500cc IVFs NS bolus to help with BP- BP was 82/50s.  20. Dysphagia per pt  12/30- ordered SLP eval and treat  1/3- placed on D3 thin diet  1/4- is better when sitting up at 90 degrees 21. Hypercalcemia  1/4- is down to 11 from 11.2- will monitor- if goes back up more, will treat. 22. Orthostatic hypotension  1/5- will start Midodrine 5 mg TID with meals. Got as low as 72/53 yesterday.      LOS: 15 days A FACE TO FACE EVALUATION WAS PERFORMED  Gregory Bautista 01/27/2020, 7:54 AM

## 2020-01-28 ENCOUNTER — Ambulatory Visit
Admission: RE | Admit: 2020-01-28 | Discharge: 2020-01-28 | Disposition: A | Discharge status: Home / Self Care | Payer: Medicare Other | Source: Ambulatory Visit | Attending: Radiation Oncology | Admitting: Radiation Oncology

## 2020-01-28 ENCOUNTER — Inpatient Hospital Stay (HOSPITAL_COMMUNITY): Payer: Medicare Other

## 2020-01-28 ENCOUNTER — Inpatient Hospital Stay (HOSPITAL_COMMUNITY): Payer: Medicare Other | Admitting: Speech Pathology

## 2020-01-28 ENCOUNTER — Inpatient Hospital Stay (HOSPITAL_COMMUNITY): Payer: Medicare Other | Admitting: Physical Therapy

## 2020-01-28 DIAGNOSIS — R918 Other nonspecific abnormal finding of lung field: Secondary | ICD-10-CM | POA: Diagnosis not present

## 2020-01-28 MED ORDER — HYDROCODONE-ACETAMINOPHEN 5-325 MG PO TABS
1.0000 | ORAL_TABLET | ORAL | Status: DC | PRN
  Administered 2020-01-31: 1 via ORAL
  Filled 2020-01-28: qty 1

## 2020-01-28 MED ORDER — PANTOPRAZOLE SODIUM 40 MG PO TBEC
40.0000 mg | DELAYED_RELEASE_TABLET | Freq: Every day | ORAL | Status: DC
  Administered 2020-01-28 – 2020-02-10 (×9): 40 mg via ORAL
  Filled 2020-01-28 (×11): qty 1

## 2020-01-28 MED ORDER — HYDROCERIN EX CREA
TOPICAL_CREAM | Freq: Two times a day (BID) | CUTANEOUS | Status: DC
  Administered 2020-02-09 – 2020-02-13 (×2): 1 via TOPICAL
  Filled 2020-01-28: qty 113

## 2020-01-28 MED ORDER — POLYETHYLENE GLYCOL 3350 17 G PO PACK
17.0000 g | PACK | Freq: Every day | ORAL | Status: DC
  Administered 2020-01-29 – 2020-01-30 (×2): 17 g via ORAL
  Filled 2020-01-28 (×2): qty 1

## 2020-01-28 NOTE — Progress Notes (Signed)
Physical Therapy Session Note  Patient Details  Name: Gregory Bautista MRN: [id number] Date of Birth: Jun 04, 1940  Today's Date: 01/28/2020 PT Individual Time: 1030-1115 PT Individual Time Calculation (min): 45 min   Short Term Goals: Week 3:  PT Short Term Goal 1 (Week 3): =LTG due to ELOS  Skilled Therapeutic Interventions/Progress Updates: Pt presented in bed agreeable to therapy. Pt states pain 2/10 currently, no intervention requested and rest breaks provided as needed. Performed bed mobility with supervision and use of bed features with increased time. Performed ambualtory transfer to w/c ~79f with RW  And supervision. Pt transported to rehab gym for energy conservation. Participated in ambulation ~118fwith RW and CGA to close S with PTA assisting with O2 tank management, SpO2>95% 2L and HR 121 after ambulation. Pt then propelled to stairs using BUE and participated in stair activities for BLE strengthening and endurance. Performed step ups x5 bilaterally with B rails then x 5 bilaterally with single rail, max HR 135 after activity. After seated rest pt then propelled back to room using BUE and supervision. Pt then performed ambulatory transfer to bed and returned to supine with supervision and use of bed features. Pt left in bed with bed alarm on, call bell within reach and needs met.      Therapy Documentation Precautions:  Precautions Precautions: Back,Fall Precaution Comments: reviewed precautions Restrictions Weight Bearing Restrictions: No  Therapy/Group: Individual Therapy  Jaena Brocato 01/28/2020, 1:27 PM

## 2020-01-28 NOTE — Progress Notes (Signed)
Speech Language Pathology Daily Session Note  Patient Details  Name: Gregory Bautista MRN: [id number] Date of Birth: 24-Dec-1940  Today's Date: 01/28/2020 SLP Individual Time: 1200-1230 SLP Individual Time Calculation (min): 30 min  Short Term Goals: Week 1: SLP Short Term Goal 1 (Week 1): Pt will consume dys 3 textures and thin liquids with mod I use of swallowing precautions SLP Short Term Goal 2 (Week 1): Pt will consume therapeutic trials of regular textures to continue working towards diet progression with mod I use of swallowing precautions and no overt s/s of aspiration  Skilled Therapeutic Interventions:  Patient prior to and during lunch for skilled ST session focusing on dysphagia goals. Patient describes feeling that food/liquids are coming back up and having the feeling that he might regurgitate (denies regurgitation or vomiting). He stated that he had a very similar presentation of symptoms 15 years ago but that they resolved without direct intervention. Patient was masticating Dys 3 meat and he then showed SLP how he could not swallow but only chew and chew and "swallow the juices". He was able to swallow liquids, gelatin without noticeable or self-reported difficulty. SLP is recommending proceed with MBS to determine if patient has any oral or pharyngeal dysphagia. Patient continues to benefit from skilled SLP intervention to maximize swallow function prior to discharge.  Pain Pain Assessment Pain Score: 0-No pain Faces Pain Scale: No hurt  Therapy/Group: Individual Therapy  Sonia Baller, MA, CCC-SLP Speech Therapy

## 2020-01-28 NOTE — Progress Notes (Signed)
Occupational Therapy Session Note  Patient Details  Name: Gregory Bautista MRN: [id number] Date of Birth: July 17, 1940  Today's Date: 01/28/2020 OT Individual Time: 3419-6222 OT Individual Time Calculation (min): 70 min    Short Term Goals: Week 3:  OT Short Term Goal 1 (Week 3): STG=LTG secondary to ELOS  Skilled Therapeutic Interventions/Progress Updates:    Pt sitting in w/c upon arrival eating breakfast. Pt required min verbal cues to initiate tasks. Pt with extended pauses between each bite. Pt engaged in grooming and UB bathing tasks at sink with more then a reasonable amount of time. Pt requires extra time to initiate all tasks and for transitions between tasks. Sit<>stand with supervision. Functional amb with RW in room with supervision. Sit>supine with supervision. Pt requires multiple rest breaks during session. Pt remained in bed with all needs within reach and bed alarm activated.   Therapy Documentation Precautions:  Precautions Precautions: Back,Fall Precaution Comments: reviewed precautions Restrictions Weight Bearing Restrictions: No Pain: Pain Assessment Pain Scale: 0-10 Pain Score: 3  Pain Type: Chronic pain Pain Location: Flank Pain Orientation: Left Pain Descriptors / Indicators: Aching Pain Intervention(s): Meds admin prior to therapy  Therapy/Group: Individual Therapy  Leroy Libman 01/28/2020, 9:33 AM

## 2020-01-28 NOTE — Progress Notes (Signed)
[place] PHYSICAL MEDICINE & REHABILITATION PROGRESS NOTE   Subjective/Complaints:  Nursing reports pt not asking for pain meds, and even refuses when offered for therapy.  Will change Oxycodone to Hydrocodone to reduce confusion with pain meds.   No voiding so far at all-  When caths q6 hours 500-600cc q6 hours.   Skin dry- will encourage PO and place order.    ROS:  Pt denies SOB, abd pain, CP, N/V/C/D, and vision changes   Objective:   No results found. No results for input(s): WBC, HGB, HCT, PLT in the last 72 hours. Recent Labs    01/26/20 0457  NA 134*  K 4.4  CL 98  CO2 28  GLUCOSE 95  BUN 22  CREATININE 0.79  CALCIUM 11.0*    Intake/Output Summary (Last 24 hours) at 01/28/2020 0849 Last data filed at 01/28/2020 0540 Gross per 24 hour  Intake 170 ml  Output 825 ml  Net -655 ml    Physical Exam: Vital Signs Blood pressure 106/68, pulse (!) 105, temperature 98.6 F (37 C), temperature source Oral, resp. rate 16, height 5\' 9"  (1.753 m), weight 68.9 kg, SpO2 96 %. Constitutional: sitting up in bedside w/c, very tired looking, literally slumped in chair, but NAD- not wearing O2 correctly.  HENT: Normocephalic.  Atraumatic. Eyes: EOMI. No discharge. Cardiovascular: tachycardic- regular rhythm Respiratory: CTA B/L- no W/R/R- good air movement (+) O2 by [place]- 2L GI: Soft, NT, ND, (+)BS  Skin: Warm and dry.  Intact. Psych: flat affect- depressed vs very tired with radiation/with poor memory.  Musc: No edema in extremities.  No tenderness in extremities. Neuro: Alert but decreased memory.  Motor: Bilateral upper extremities: Grossly 5/5 proximal distal Bilateral lower extremities: Grossly 4+/5 proximal to distal, stable  Assessment/Plan: 1. Functional deficits which require 3+ hours per day of interdisciplinary therapy in a comprehensive inpatient rehab setting.  Physiatrist is providing close team supervision and 24 hour management of active medical problems  listed below.  Physiatrist and rehab team continue to assess barriers to discharge/monitor patient progress toward functional and medical goals  Care Tool:  Bathing    Body parts bathed by patient: Right arm,Left arm,Chest,Abdomen,Right upper leg,Left upper leg,Face,Front perineal area   Body parts bathed by helper: Buttocks,Right lower leg,Left lower leg Body parts n/a: Front perineal area,Buttocks,Right lower leg,Left lower leg   Bathing assist Assist Level: Minimal Assistance - Patient > 75%     Upper Body Dressing/Undressing Upper body dressing   What is the patient wearing?: Pull over shirt    Upper body assist Assist Level: Set up assist    Lower Body Dressing/Undressing Lower body dressing      What is the patient wearing?: Incontinence brief     Lower body assist Assist for lower body dressing: Total Assistance - Patient < 25%     Toileting Toileting    Toileting assist Assist for toileting: Moderate Assistance - Patient 50 - 74% Assistive Device Comment: BSC   Transfers Chair/bed transfer  Transfers assist     Chair/bed transfer assist level: Contact Guard/Touching assist     Locomotion Ambulation   Ambulation assist      Assist level: Minimal Assistance - Patient > 75% Assistive device: Walker-rolling Max distance: 60'   Walk 10 feet activity   Assist     Assist level: Minimal Assistance - Patient > 75% Assistive device: Walker-rolling   Walk 50 feet activity   Assist Walk 50 feet with 2 turns activity did not occur: Safety/medical  concerns  Assist level: Minimal Assistance - Patient > 75% Assistive device: Walker-rolling    Walk 150 feet activity   Assist Walk 150 feet activity did not occur: Safety/medical concerns         Walk 10 feet on uneven surface  activity   Assist Walk 10 feet on uneven surfaces activity did not occur: Safety/medical concerns         Wheelchair     Assist Will patient use  wheelchair at discharge?: Yes Type of Wheelchair: Manual    Wheelchair assist level: Supervision/Verbal cueing Max wheelchair distance: 100'    Wheelchair 50 feet with 2 turns activity    Assist        Assist Level: Supervision/Verbal cueing   Wheelchair 150 feet activity     Assist      Assist Level: Moderate Assistance - Patient 50 - 74%   Blood pressure 106/68, pulse (!) 105, temperature 98.6 F (37 C), temperature source Oral, resp. rate 16, height 5\' 9"  (1.753 m), weight 68.9 kg, SpO2 96 %.  Medical Problem List and Plan: 1.  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  Continue CIR  2.  Antithrombotics: -DVT/anticoagulation:   Heparin.  Mechanical: Sequential compression devices, below knee Bilateral lower extremities   Dopplers negative for DVT  -antiplatelet therapy: N/A 3. Pain Management:    Decreased oxycodone to q4H prn.  DC gabapentin since causing constipation  Added Cymbalta 30 mg daily.   1/3- will increase Duloxetine to 60 mg nightly- for nerve pain  1/5- not taking pain meds often- encouraged pt to take pain meds before therapy.   1/6- will reduce oxy to Norco q4 hours prn due to increased confusion with pain meds 4. Mood: LCSW to follow for evaluation and support.              -antipsychotic agents: N/a 5. Neuropsych: This patient is capable of making decisions on his own behalf.  Appreciate neuropsych 6. Skin/Wound Care: Routine pressure relief measures.  7. Fluids/Electrolytes/Nutrition: Monitor I/Os.             Added juven to promote healing.  8. Neurogenic bladder: Foley in place due to urinary retention/UTI, with plans to remove on  Monday  Flomax daily initiated on 12/30, monitor blood pressure closely  Florinef 0.1 mg daily initiated to assist with low BP.   Improving on 1/2  1/3- will d/c foley this afternoon, after radiation  1/4- wasn't removed- will remove today- this AM and ordered I/o caths and PVRs since might not void.  1/5- so far, no voiding- cathing required 100%   1/6- no voiding- caths volumes 500-600cc q6 hours-per pt continue until next Tuesday 9.  Acute renal failure: Resolved  Creatinine 0.86 on 12/27  10. Acute blood loss anemia:   Hemoglobin 12.4 on 12/28  Continue to monitor 11. Metastatic Lung AdenoCA:  On dulera--has been on supplemental oxygen  1/4- didn't get radiation yesterday- is scheduled ot go back today.  1/6- Getting radiation daily M-F  12. Kleb/Proteus UTI: Completed course of Cipro 13. 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  1/5- refused bowel program last night  And as a result had accident this AM.   1/6- hard clumps of stool- will add Miralax daily.  14. Hypotension-   Severe orthostasis, negative on 12/31  Relatively controlled on 1/2  TEDs/abdominal binder for support.   1/5- added midodrine 5 mg TID-AC for low BPs 15.  Sleep disturbance: Scheduled melatonin at nights.   Trazodone 100  1/3- sleeping well- con't regimen 17. Tenting/mild dehydration- will push 6 cups/water daily minimum  1/6- skin very dry- order eucerin AND push PO fluids 18. Hyperkalemia  Lokelma ordered on 12/23  Potassium 4.6 on 12/27 19. Pancoast tumor/ metastatic lung cancer:   Radiation to begin on 1/3 and to see Dr Julien Nordmann 1/3 at 2:15 pm  1/4- didn't get radiation- will make sure scheduled  to  go back today  1/5- made sure pt went to radiation -his BP was low- but gave him 500cc IVFs NS bolus to help with BP- BP was 82/50s.  20. Dysphagia per pt  12/30- ordered SLP eval and treat  1/3- placed on D3 thin diet  1/4- is better when sitting up at 90 degrees  1/6- will add Protonix for reflux Sx's.  21. Hypercalcemia  1/4- is down to 11 from 11.2- will monitor- if goes back up more, will treat.  Called brother- couldn't get daughter on phone.  Spent 15 minutes on phone in addition to complex care today-35 minutes total care- > 50% on coordination of care. Also spoke with palliative care about consult and brother as above.     LOS: 16 days A FACE TO FACE EVALUATION WAS PERFORMED  Sadonna Kotara 01/28/2020, 8:49 AM

## 2020-01-28 NOTE — Progress Notes (Signed)
   01/28/20 1413  Assess: if the MEWS score is Yellow or Red  Were vital signs taken at a resting state? Yes  Focused Assessment No change from prior assessment  Early Detection of Sepsis Score *See Row Information* Low  MEWS guidelines implemented *See Row Information* No, vital signs rechecked  Take Vital Signs  Increase Vital Sign Frequency  Yellow: Q 2hr X 2 then Q 4hr X 2, if remains yellow, continue Q 4hrs  Escalate  MEWS: Escalate Yellow: discuss with charge nurse/RN and consider discussing with provider and RRT  Notify: Charge Nurse/RN  Name of Charge Nurse/RN Notified Neoma Laming RN  Date Charge Nurse/RN Notified 01/28/20  Time Charge Nurse/RN Notified 1430  Notify: Provider  Provider Name/Title Marlowe Shores  Date Provider Notified 01/28/20  Time Provider Notified 1437  Notification Type Call  Notification Reason Other (Comment) (no new orders)  Date of Provider Response 01/28/20  Time of Provider Response 1437  Document  Patient Outcome Stabilized after interventions    Pt was taken to radiation treatment. AXO4, sitting in wheelchair when taken to treatment. Will begin yellow muse status.   Dayna Ramus

## 2020-01-29 ENCOUNTER — Inpatient Hospital Stay (HOSPITAL_COMMUNITY): Payer: Medicare Other | Admitting: *Deleted

## 2020-01-29 ENCOUNTER — Inpatient Hospital Stay (HOSPITAL_COMMUNITY): Payer: Medicare Other

## 2020-01-29 ENCOUNTER — Inpatient Hospital Stay (HOSPITAL_COMMUNITY): Payer: Medicare Other | Admitting: Speech Pathology

## 2020-01-29 ENCOUNTER — Inpatient Hospital Stay (HOSPITAL_COMMUNITY): Payer: Medicare Other | Admitting: Physical Therapy

## 2020-01-29 ENCOUNTER — Ambulatory Visit
Admission: RE | Admit: 2020-01-29 | Discharge: 2020-01-29 | Disposition: A | Discharge status: Home / Self Care | Payer: Medicare Other | Source: Ambulatory Visit | Attending: Radiation Oncology | Admitting: Radiation Oncology

## 2020-01-29 DIAGNOSIS — Z7189 Other specified counseling: Secondary | ICD-10-CM

## 2020-01-29 DIAGNOSIS — R918 Other nonspecific abnormal finding of lung field: Secondary | ICD-10-CM | POA: Diagnosis not present

## 2020-01-29 DIAGNOSIS — Z515 Encounter for palliative care: Secondary | ICD-10-CM

## 2020-01-29 LAB — CBC WITH DIFFERENTIAL/PLATELET
Abs Immature Granulocytes: 0.03 10*3/uL (ref 0.00–0.07)
Basophils Absolute: 0 10*3/uL (ref 0.0–0.1)
Basophils Relative: 0 %
Eosinophils Absolute: 0 10*3/uL (ref 0.0–0.5)
Eosinophils Relative: 1 %
HCT: 33.7 % — ABNORMAL LOW (ref 39.0–52.0)
Hemoglobin: 10.4 g/dL — ABNORMAL LOW (ref 13.0–17.0)
Immature Granulocytes: 1 %
Lymphocytes Relative: 17 %
Lymphs Abs: 1 10*3/uL (ref 0.7–4.0)
MCH: 28 pg (ref 26.0–34.0)
MCHC: 30.9 g/dL (ref 30.0–36.0)
MCV: 90.6 fL (ref 80.0–100.0)
Monocytes Absolute: 0.8 10*3/uL (ref 0.1–1.0)
Monocytes Relative: 14 %
Neutro Abs: 4 10*3/uL (ref 1.7–7.7)
Neutrophils Relative %: 67 %
Platelets: 270 10*3/uL (ref 150–400)
RBC: 3.72 MIL/uL — ABNORMAL LOW (ref 4.22–5.81)
RDW: 13 % (ref 11.5–15.5)
WBC: 5.9 10*3/uL (ref 4.0–10.5)
nRBC: 0 % (ref 0.0–0.2)

## 2020-01-29 LAB — COMPREHENSIVE METABOLIC PANEL
ALT: 30 U/L (ref 0–44)
AST: 20 U/L (ref 15–41)
Albumin: 2.3 g/dL — ABNORMAL LOW (ref 3.5–5.0)
Alkaline Phosphatase: 63 U/L (ref 38–126)
Anion gap: 9 (ref 5–15)
BUN: 21 mg/dL (ref 8–23)
CO2: 28 mmol/L (ref 22–32)
Calcium: 11.4 mg/dL — ABNORMAL HIGH (ref 8.9–10.3)
Chloride: 98 mmol/L (ref 98–111)
Creatinine, Ser: 0.94 mg/dL (ref 0.61–1.24)
GFR, Estimated: >60 mL/min (ref >60)
Glucose, Bld: 109 mg/dL — ABNORMAL HIGH (ref 70–99)
Potassium: 4.1 mmol/L (ref 3.5–5.1)
Sodium: 135 mmol/L (ref 135–145)
Total Bilirubin: 0.6 mg/dL (ref 0.3–1.2)
Total Protein: 7.1 g/dL (ref 6.5–8.1)

## 2020-01-29 MED ORDER — MIDODRINE HCL 5 MG PO TABS
10.0000 mg | ORAL_TABLET | Freq: Three times a day (TID) | ORAL | Status: DC
Start: 2020-01-29 — End: 2020-02-09
  Administered 2020-01-29 – 2020-02-06 (×19): 10 mg via ORAL
  Filled 2020-01-29 (×23): qty 2

## 2020-01-29 MED ORDER — FLUDROCORTISONE ACETATE 0.1 MG PO TABS
0.2000 mg | ORAL_TABLET | Freq: Every day | ORAL | Status: DC
  Administered 2020-01-30 – 2020-02-07 (×8): 0.2 mg via ORAL
  Filled 2020-01-29 (×11): qty 2

## 2020-01-29 NOTE — Progress Notes (Signed)
Recreational Therapy Session Note  Patient Details  Name: Gregory Bautista MRN: [id number] Date of Birth: 03/20/1940 Today's Date: 01/29/2020  Eval deferred today.  Palliative care in talking with pt and pt requesting time to process after this visit. [place] 01/29/2020, 12:09 PM

## 2020-01-29 NOTE — Progress Notes (Signed)
Bowel program started at 20:50. Pt was placed on Left side, dig stim was done with small bowel result. Suppository administered.On next round dig stim, -small amount of stool. No more stool in rectum. Peri care done. Pt tolerated well.

## 2020-01-29 NOTE — Progress Notes (Signed)
Modified Barium Swallow Progress Note  Patient Details  Name: Gregory Bautista MRN: [id number] Date of Birth: 1940/03/05  Today's Date: 01/29/2020  Modified Barium Swallow completed.  Full report located under Chart Review in the Imaging Section.  Brief recommendations include the following:  Clinical Impression  Patient presents with a moderate oral and mild pharyngeal dysphagia. Oral phase of swallow consists of delayed anterior to posterior transit of all boluses (heavier boluses leading to longer delays), decreased bolus cohesion and piecemeal swallowing with mechanical soft solids, decreased mastication of mechanical soft solids. Pharyngeal phase consisted of minimal instances of delays of swallow initiation to vallecular sinus with thin liquids, mild amount of pyriform residuals and trace to mild vallecular sinus residuals with thin liquids,mechanical soft solids. 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 No penetration or aspiration events observed.   Swallow Evaluation Recommendations       SLP Diet Recommendations: Dysphagia 1 (Puree) solids;Thin liquid   Liquid Administration via: Cup;Straw   Medication Administration: Whole meds with puree   Supervision: Patient able to self feed   Compensations: Minimize environmental distractions;Slow rate;Small sips/bites;Follow solids with liquid   Postural Changes: Remain semi-upright after after feeds/meals (Comment)   Oral Care Recommendations: Oral care BID      Sonia Baller, MA, CCC-SLP Speech Therapy

## 2020-01-29 NOTE — Consult Note (Signed)
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  Tumor was not felt to be resectable due to advanced disease and spine stabilization recommended with plans for chemo and XRT in the future.  He was evaluated by Dr. JArnoldo Moraleand was admitted on 12/30/2019 for T2-T4 laminectomy for tumor resection with T1-T5 posterior arthrodesis with pedicle screws and rods.  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  Plans are for XRT by Dr. KRanda Ngowith reports to of simulation set for 12/27. Had been set for follow up with Dr. MEarlie Serveron outpatient basis.   Palliative care was asked to get involved in the setting of metastatic lung cancer and the patient no longer wishing to continue treatments.  Clinical Assessment/Goals of Care: I have reviewed medical records including EPIC notes, labs and imaging, received report from bedside RN, assessed the patient who was sitting up in his wheelchair eating breakfast in no acute distress.    I met with HTanna Savoyto further discuss diagnosis prognosis, GOC, EOL wishes, disposition and options.   I introduced Palliative Medicine as specialized medical care for people living with serious illness. It focuses on providing relief from the symptoms and stress of a serious illness. The goal is to improve quality of life for both the patient and the family.  HSilviashares with me [place]  He has never been married though he does have 4 children.  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  HWaleedis a member of [employer]and does consider himself to be quite faithful.  Prior to hospitalization HMeadewas fully independent of basic activities of daily living and instrumental activities of daily living.  Asked HIziahwhat he understood about his present medical condition.  We focused quite a lot in our conversation on his metastatic lung cancer diagnosis (LUL Adenocarcinoma, IIIA) which he is presently getting radiation treatment.  I shared with him that there were concerns of the medical team that perhaps he no longer wish to continue these aggressive therapies.  HShanastates that he does not remember saying that and as it sits right now he does want to continue with radiation therapy.   From a symptoms perspective HQuantrellendorses his "usual level of pain".  He endorses that he is having a lack of appetite.  He denies any nausea or vomiting.  We discussed moving forward that HRodertwill have a variety of deficits physically and he may not be ever to the level of independence that he wants was.  He shared that this was the first time he was hearing this information and  it was a bit surprising.  I asked him what his goals and hopes for the future are.  He shares with me that he would ideally like to be back to some level of independence again.  I asked him if he had solidified additional help at home once he leaves the hospital which he states he has not.  A detailed discussion was had today regarding advanced directives -Arian has never created or completed advanced directives though  he is interested in doing so while hospitalized.  He would elect for his daughter Gregory Bautista, and his brother, Gregory Bautista to make decisions in his honor should he ever be incapacitated.   Concepts specific to code status, artifical feeding and hydration, continued IV antibiotics and rehospitalization was had.    I introduced the MOST form which Zymeir had never seen before.  I discussed with him that the likelihood of meaningful recovery for someone like him with his past medical history would not be tremendously good and we may cause him quite a lot more harm than benefit.  He did understand this information though he does share that he would like to speak to his family more about this topic.  We tried to call his daughter Gregory Bautista at bedside though she did not pick up the phone.  The difference between a aggressive medical intervention path  and a palliative comfort care path for this patient at this time was had.  I shared with Gregory Bautista that if at any time the treatments become overwhelming and he no longer wishes to continue them that there is another option called hospice.  I described hospice as a service for patients for have a life expectancy of < 6 months. It preserves dignity and quality at the end phases of life. The focus changes from curative to symptom relief.   Discussed the importance of continued conversation with family and their  medical providers regarding overall plan of care and treatment options, ensuring decisions are within the context of the patients values and GOCs.  Provided "Hard Choices for Loving People" booklet.  ________________________________________________________ Addendum:  This afternoon I met with Gregory Bautista, his brother Gregory Bautista, and his daughter Gregory Bautista via speaker phone.  I again introduced the topic of palliative care the field of medicine that aids patients who have serious illnesses to help improve the quality of life for both the patient and their family.  We discussed the  importance of advance care planning and I did introduce the advanced care forms as well as the MOST form.  We reviewed all of these documents over the phone.  Tallen's brother and daughter would like to take some time this evening to go over a more formally prior to filling it out.  A great degree of our conversation circulated around Gregory Bautista impaired mobility state and the likelihood that he will not be completely independent any longer.  Gregory Bautista - medical social worker was able to join our discussion to determine short-term and long-term discharge plans.  We discussed that Gregory Bautista will need 24/7 support at least for the immediate.  When he discharges home.  This is complicated as Jahaad's family lives in [place].  They expressed that they do not know anyone who could help him locally.  Additional information on potential resources was provided.  Palliative care plans to reach out to [facility] and his family tomorrow to follow-up on the advance care planning documents.  Otherwise all questions answered to the best of our abilities.  Time In: 1300 Time Out: 1340  Additional Time: 40 Greater than 50%  of this time was spent counseling and coordinating care related to the above assessment and plan.  Decision Maker: Gregory Bautista (daughter) [phone]  SUMMARY OF RECOMMENDATIONS   Full Code / Full scope of care  Patient would like to continue with treatments for lung CA as of now  Outpatient Palliative Support ordered  Spiritual support   Code Status/Advance Care Planning: FULL CODE  Palliative Prophylaxis:   Oral Care, Mobility, Pain and delirium precuations  Additional Recommendations (Limitations, Scope, Preferences):  Full scope of care  Psycho-social/Spiritual:   Desire for further Chaplaincy support: Yes - Holiness  Additional Recommendations: Education on metastasis   Prognosis: Unclear  Discharge Planning: Unclear  Vitals:   01/28/20 2321 01/29/20 0313  BP:  120/70 128/67  Pulse: (!) 103 (!) 110  Resp:  16  Temp: 98.3 F (36.8 C) 98.9 F (37.2 C)  SpO2: 94% 93%    Intake/Output Summary (Last 24 hours) at 01/29/2020 7681 Last data filed at 01/29/2020 0309 Gross per 24 hour  Intake -  Output 600 ml  Net -600 ml   Last Weight  Most recent update: 01/22/2020  4:45 AM   Weight  68.9 kg (151 lb 14.4 oz)           Gen:  Older AA M in NAD HEENT: moist mucous membranes CV: Regular rate and rhythm  PULM: clear to auscultation bilaterally  ABD: soft/nontender  EXT: No edema  Neuro: Alert and oriented x3-4  PPS: 30%   This conversation/these recommendations were discussed with patient primary care team, Dr. Dagoberto Ligas  Time In: 0910 Time Out: 1020 Total Time: 70 Greater than 50%  of this time was spent counseling and coordinating care related to the above assessment and plan.  [place] Team Team Cell Phone: [phone] Please utilize secure chat with additional questions, if there is no response within 30 minutes please call the above phone number  Palliative Medicine Team providers are available by phone from 7am to 7pm daily and can be reached through the team cell phone.  Should this patient require assistance outside of these hours, please call the patient's attending physician.

## 2020-01-29 NOTE — Progress Notes (Signed)
Physical Therapy Session Note  Patient Details  Name: Gregory Bautista MRN: [id number] Date of Birth: 05/16/1940  Today's Date: 01/29/2020 PT Individual Time: 1017-1032 PT Individual Time Calculation (min): 15 min   Short Term Goals: Week 3:  PT Short Term Goal 1 (Week 3): =LTG due to ELOS  Skilled Therapeutic Interventions/Progress Updates: Upon entry pt eating breakfast with brother present. Brother concerned as pt not eating, per flow sheet pt eating 25-50% of meals consistently to which brother was pleased to hear. PTA advised will try to return to perform therapies. PTA returned ~1 hour later with Palliative in room, leaving room ~2 min later. Upon entry to room pt initially agreeable to PT. PTA obtained pants and pt donned with supervision and increased time. Pt then indicated "needed time to think" and "process". Pt performed STS with supervision with RW and pull up pants with supervision then performed stand pivot to bed supervision. Performed sit to supine supervision with bed features and repositioned to comfort. Pt left in bed with bed alarm on, call bell within reach and needs met. Pt missed total 30 min skilled therapy.      Therapy Documentation Precautions:  Precautions Precautions: Back,Fall Precaution Comments: reviewed precautions Restrictions Weight Bearing Restrictions: No General: PT Amount of Missed Time (min): 30 Minutes PT Missed Treatment Reason: Patient unwilling to participate;Unavailable (Comment) Vital Signs: Therapy Vitals Temp: 98.6 F (37 C) Pulse Rate: (!) 110 Resp: 18 BP: 99/70 Patient Position (if appropriate): Lying Oxygen Therapy SpO2: 97 % O2 Device: Nasal Cannula    Therapy/Group: Individual Therapy    01/29/2020, 1:06 PM  

## 2020-01-29 NOTE — Progress Notes (Signed)
Occupational Therapy Note  Patient Details  Name: Gregory Bautista MRN: [id number] Date of Birth: 1940-08-16  Today's Date: 01/29/2020 OT Missed Time: 75 Minutes Missed Time Reason: Patient fatigue  Pt sleeping upon arrival but easily aroused. Pt stated he wasn't "feeling good" and was too tired to try to get OOB. Pt did not have breakfast tray and NT notified.  Per RN report, pt had yellow MEWS before radiation yesterday. Pt unable to stand with nursing during night for orthostatic BP readings. Pt missed 75 mins skilled OT services.    Leotis Shames [facility] 01/29/2020, 7:51 AM

## 2020-01-29 NOTE — Progress Notes (Signed)
[facility] PHYSICAL MEDICINE & REHABILITATION PROGRESS NOTE   Subjective/Complaints:  Pt reports he voided some- not "a lot" last night- but still requiring caths.   Didn't get food tray this AM- nursing is getting one for him.   Spoke with SLP - going to do MBS.  More tired after each radiation treatment-   Bowel program went OK per pt, but isn't documented. Does have in flowsheet- as small hard BM- sounds like DID have bowel program done.   Spoke with SW at length - will likely need to keep until done with radiation- cannot go to SNF on chemo/radiation and entire family is ill.     ROS:   Pt denies SOB, abd pain, CP, N/V/C/D, and vision changes   Objective:   No results found. No results for input(s): WBC, HGB, HCT, PLT in the last 72 hours. No results for input(s): NA, K, CL, CO2, GLUCOSE, BUN, CREATININE, CALCIUM in the last 72 hours.  Intake/Output Summary (Last 24 hours) at 01/29/2020 1036 Last data filed at 01/29/2020 0309 Gross per 24 hour  Intake --  Output 600 ml  Net -600 ml    Physical Exam: Vital Signs Blood pressure (!) 112/58, pulse (!) 116, temperature 99.5 F (37.5 C), temperature source Oral, resp. rate 19, height 5\' 9"  (1.753 m), weight 68.9 kg, SpO2 94 %. Constitutional: laying in bed; appropriate, very fatigued appearing, NAD  HENT: Normocephalic.  Atraumatic. Eyes: EOMI. No discharge. Cardiovascular: tachycardic- regular rhythm- rat ein 100-s110s Respiratory: O2 by [place]- 2L- decreased at bases- quiet breath sounds; no W/R/R GI: Soft, NT, ND, (+)BS  Skin: Warm and dry.  Intact. Psych: poor memory- VERY fatigued- looks drained Musc: No edema in extremities.  No tenderness in extremities. Neuro: Alert but decreased memory.  Motor: Bilateral upper extremities: Grossly 5/5 proximal distal Bilateral lower extremities: Grossly 4+/5 proximal to distal, stable  Assessment/Plan: 1. Functional deficits which require 3+ hours per day of interdisciplinary  therapy in a comprehensive inpatient rehab setting.  Physiatrist is providing close team supervision and 24 hour management of active medical problems listed below.  Physiatrist and rehab team continue to assess barriers to discharge/monitor patient progress toward functional and medical goals  Care Tool:  Bathing    Body parts bathed by patient: Right arm,Left arm,Chest,Abdomen,Right upper leg,Left upper leg,Face,Front perineal area   Body parts bathed by helper: Buttocks,Right lower leg,Left lower leg Body parts n/a: Front perineal area,Buttocks,Right lower leg,Left lower leg   Bathing assist Assist Level: Minimal Assistance - Patient > 75%     Upper Body Dressing/Undressing Upper body dressing   What is the patient wearing?: Pull over shirt    Upper body assist Assist Level: Set up assist    Lower Body Dressing/Undressing Lower body dressing      What is the patient wearing?: Incontinence brief     Lower body assist Assist for lower body dressing: Total Assistance - Patient < 25%     Toileting Toileting    Toileting assist Assist for toileting: Moderate Assistance - Patient 50 - 74% Assistive Device Comment: BSC   Transfers Chair/bed transfer  Transfers assist     Chair/bed transfer assist level: Contact Guard/Touching assist     Locomotion Ambulation   Ambulation assist      Assist level: Minimal Assistance - Patient > 75% Assistive device: Walker-rolling Max distance: 60'   Walk 10 feet activity   Assist     Assist level: Minimal Assistance - Patient > 75% Assistive device: Walker-rolling  Walk 50 feet activity   Assist Walk 50 feet with 2 turns activity did not occur: Safety/medical concerns  Assist level: Minimal Assistance - Patient > 75% Assistive device: Walker-rolling    Walk 150 feet activity   Assist Walk 150 feet activity did not occur: Safety/medical concerns         Walk 10 feet on uneven surface   activity   Assist Walk 10 feet on uneven surfaces activity did not occur: Safety/medical concerns         Wheelchair     Assist Will patient use wheelchair at discharge?: Yes Type of Wheelchair: Manual    Wheelchair assist level: Supervision/Verbal cueing Max wheelchair distance: 100'    Wheelchair 50 feet with 2 turns activity    Assist        Assist Level: Supervision/Verbal cueing   Wheelchair 150 feet activity     Assist      Assist Level: Moderate Assistance - Patient 50 - 74%   Blood pressure (!) 112/58, pulse (!) 116, temperature 99.5 F (37.5 C), temperature source Oral, resp. rate 19, height 5\' 9"  (1.753 m), weight 68.9 kg, SpO2 94 %.  Medical Problem List and Plan: 1.  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  1/7- spoke to brother yesterday who said he'd pass on to daughter- sounds like he didn't- palliative care to see today.   Continue CIR  2.  Antithrombotics: -DVT/anticoagulation:   Heparin.  Mechanical: Sequential compression devices, below knee Bilateral lower extremities   Dopplers negative for DVT  -antiplatelet therapy: N/A 3. Pain Management:    Decreased oxycodone to q4H prn.  DC gabapentin since causing constipation  Added Cymbalta 30 mg daily.   1/3- will increase Duloxetine to 60 mg nightly- for nerve pain  1/5- not taking pain meds often- encouraged pt to take pain meds before therapy.   1/6- will reduce oxy to Norco q4 hours prn due to increased confusion per family (nursing hasn't seen it)  with pain meds  1/7- still rarely taking pain meds 4. Mood: LCSW to follow for evaluation and support.              -antipsychotic  agents: N/a 5. Neuropsych: This patient is capable of making decisions on his own behalf.  Appreciate neuropsych 6. Skin/Wound Care: Routine pressure relief measures.  7. Fluids/Electrolytes/Nutrition: Monitor I/Os.             Added juven to promote healing.  8. Neurogenic bladder: Foley in place due to urinary retention/UTI, with plans to remove on Monday  Flomax daily initiated on 12/30, monitor blood pressure closely  Florinef 0.1 mg daily initiated to assist with low BP.   Improving on 1/2  1/3- will d/c foley this afternoon, after radiation  1/4- wasn't removed- will remove today- this AM and ordered I/o caths and PVRs since might not void.  1/5- so far, no voiding- cathing required 100%   1/6- no voiding- caths volumes 500-600cc q6 hours-per pt continue until next Tuesday 9.  Acute renal failure: Resolved  Creatinine 0.86 on 12/27  10. Acute blood loss anemia:   Hemoglobin 12.4 on 12/28  Continue to monitor 11. Metastatic Lung AdenoCA:  On dulera--has been on supplemental oxygen  1/4- didn't get radiation yesterday- is scheduled ot go back today.  1/6- Getting radiation daily M-F  12. Kleb/Proteus UTI: Completed course of Cipro 13. 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  1/5- refused bowel program last night  And as a result had accident this AM.   1/6- hard clumps of stool- will add Miralax daily.   1/7- hard clumps- just added miralax so will wait to increase til this weekend.  14. Hypotension-   Severe orthostasis, negative on 12/31  Relatively controlled on 1/2  TEDs/abdominal binder for support.   1/5- added midodrine 5 mg TID-AC for low Bps  1/7- increased  Midodrine to 10 mg TID- will also increase Florinef to help with orthostatic hypotension. It's really bad when goes to radiation 15.  Sleep disturbance: Scheduled melatonin at nights.   Trazodone 100  1/3- sleeping well- con't regimen 17. Tenting/mild dehydration- will push 6 cups/water daily minimum  1/6- skin very dry- order eucerin AND push PO fluids 18. Hyperkalemia  Lokelma ordered on 12/23  Potassium 4.6 on 12/27 19. Pancoast tumor/ metastatic lung cancer:   Radiation to begin on 1/3 and to see Dr Julien Nordmann 1/3 at 2:15 pm  1/4- didn't get radiation- will make sure scheduled to  go back today  1/5- made sure pt went to radiation -his BP was low- but gave him 500cc IVFs NS bolus to help with BP- BP was 82/50s.   1/7- Increased Midodrine and florienf to help with low BP.  20. Dysphagia per pt  12/30- ordered SLP eval and treat  1/3- placed on D3 thin diet  1/4- is better when sitting up at 90 degrees  1/6- will add Protonix for reflux Sx's.   1/7- SLP to do MBS- don't know when scheduled.  21. Hypercalcemia  1/4- is down to 11 from 11.2- will monitor- if goes back up more, will treat.   Spent 35 minutes on care today- >50% coordination of care- talked to palliative care, nursing who reports pt does NOT appear to be confused with her for last 3 days, and with SW about needing to keep pt until done with radiation.      LOS: 17 days A FACE TO FACE EVALUATION WAS PERFORMED  Laretta Pyatt 01/29/2020, 10:36 AM

## 2020-01-29 NOTE — Progress Notes (Addendum)
Patient ID: Gregory Bautista, male   DOB: 1940/09/23, 80 y.o.   MRN: [id number]  SW spoke with pt dtr Gregory Bautista to follow-up about plan of care needs for patient at d/c since he will require 24/7 care with assistance. States that her home has COVID, and his brother Gregory Bautista is returning back to [place] to have his health issues evaluated. When discussing alternative options, SW informed SNF is not an option as pt is currently in treatment and would not be accepted to a SNF. Pt only option is home with [facility] therapies, and a pending palliative care consult who will discuss with patient goals of care,and who also offer only monthly check-ins as support. States that she would like for her Gregory Bautista to be contacted at the time of the meeting with palliative care. SW did inform it is at the discretion of the patient, but if asked he could oblige. She states I am sure if he is asked he will say "yes." Also states that she and the uncle have both noted that pt is presenting with more confusion and poor memory. SW informed that this is another reason the palliative consult has been requested as well. SW encouraged her to speak with the family again to discuss how they can assist with pt care needs if patient's d/c date remains 1/12. SW stated will follow-up after team conference to inform on if pt time will be extended as again the team will make this decision if we can medically support him remaining here in rehab.   SW scheduled next week's treatment appointments with Gregory Bautista/[facility] [phone]) for 2pm pick-ups Monday-Friday. SW encouraged to follow-up on Monday to ensure pt appointments are on the schedule.  *SW asked to be a part of palliative care meeting per palliative RN Gregory Bautista to discuss discharge planning options. Pt brother Gregory Bautista present and dtr Gregory Bautista by phone. SW reiterated above conversation with Gregory Bautista. SW discussed if team were to agree for pt to stay through treatment, it would be up to insurance to approve  for SNF short term rehab if find a nursing home is willing to accept patient. Pt brother Gregory Bautista expressed that there is no one that is able to help him since they are all elderly themselves. SW encouraged the family to discuss pt level of care needs since he will need 24/7 care until he is strong enough to be on his own. SW also encouraged them to speak with their church community to explore if there is someone that may be willing to help assist him when he discharges home if he is unable to go to a SNF. SW emphasized even if pt is accepted to SNF for short term rehab, if he still requires assistance at d/c, they should have a plan. Family to discuss this weekend, and SW to follow-up with pt dtr Gregory Bautista on Monday.     SW left message for Gregory Bautista/RN at [facility] to inquire if pt is or can be linked to a Education officer, museum to discuss potential resources that may be available to him, if any.   Gregory Bautista, MSW, [place] Office: [phone] Cell: [phone] Fax: [phone]

## 2020-01-29 NOTE — Progress Notes (Signed)
RN started bowel program at 2100. Digital stimulation done with jelly-like stool on glove. Suppository administered. Pt has not expressed for the need to have a BM. RN will perform another round of digital stimulation. Peri care done and pt put on left side. Pt tolerated well. RN will continue to monitor

## 2020-01-30 ENCOUNTER — Inpatient Hospital Stay (HOSPITAL_COMMUNITY): Payer: Medicare Other

## 2020-01-30 ENCOUNTER — Inpatient Hospital Stay (HOSPITAL_COMMUNITY): Payer: Medicare Other | Admitting: Physical Therapy

## 2020-01-30 DIAGNOSIS — Z66 Do not resuscitate: Secondary | ICD-10-CM

## 2020-01-30 NOTE — Progress Notes (Signed)
RN attempted digital stimulation. Pt refused. Pt stated, " I am okay. I am very sleepy and do not feel like I can have a BM." RN will continue to monitor.

## 2020-01-30 NOTE — Progress Notes (Signed)
Speech Language Pathology Daily Session Note  Patient Details  Name: Gregory Bautista MRN: [id number] Date of Birth: 21-Sep-1940  Today's Date: 01/30/2020 SLP Individual Time: 8675-4492 SLP Individual Time Calculation (min): 43 min  Short Term Goals: Week 1: SLP Short Term Goal 1 (Week 1): Pt will consume dys 3 textures and thin liquids with mod I use of swallowing precautions SLP Short Term Goal 2 (Week 1): Pt will consume therapeutic trials of regular textures to continue working towards diet progression with mod I use of swallowing precautions and no overt s/s of aspiration  Skilled Therapeutic Interventions: Pt seen for skilled ST targeting dysphagia goals, pt seen at AM meal following recent MBS and downgrade to Dys 1. Pt denies feeling of food/liquid coming up and is expressing displeasure with meal. Difficult to determine if pt unhappy with texture vs taste, pt eventually stating he has no appetite and its hard for him to eat any presentation of food at this time. SLP presenting variety of puree and Dys 2 textures, tastes and temperatures to attempt to increase intake. Pt observed with few bites puree and Dys 2, extended mastication and poor bolus cohesion/awareness with all trials. Bolus eventually mixing with saliva, pooling in oral cavity with no attempt to swallow. Consistent verbal cues and liquid wash required for pt to initiate swallow for puree and Dys 2 textures. No overt s/s aspiration across trials. Pt demonstrates decreased insight in to swallow deficits (primarily oral phase), at times resistant to education from SLP to increase awareness and safety. Pt left upright in WC with all needs within reach, cont ST POC.   Pain Pain Assessment Pain Scale: 0-10 Pain Score: 0-No pain  Therapy/Group: Individual Therapy  Dewaine Conger 01/30/2020, 8:36 AM

## 2020-01-30 NOTE — Progress Notes (Signed)
RN attempted to get orthostatic blood pressures. Pt was able to sit up in bed but became dizzy when standing. RN got vitals for lying and sitting. Pt denied dizziness after lying back in bed. RN will continue to monitor.

## 2020-01-30 NOTE — Progress Notes (Signed)
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 Tumor was not felt to be resectable due to advanced disease and spine stabilization recommended with plans for chemo and XRT in the future. He was evaluated by Dr. JArnoldo Moraleand was admitted on 12/30/2019 for T2-T4 laminectomy for tumor resection with T1-T5 posterior arthrodesis with pedicle screws and rods. 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 Plans are for XRT by Dr. KRanda Ngowith reports to of simulation set for 12/27. Had been set for follow up with Dr. MEarlie Serveron outpatient basis.   Palliative care was asked to get involved in the setting of metastatic lung cancer and the patient no longer wishing to continue treatments.  Today's Discussion (01/30/2020): Chart reviewed. I met with Gregory Bautista at bedside. He was sitting up in the chair preparing for breakfast. I shared with him that I would be back closer to 10AM to discuss advance directives and the MOST form.  I met with HDemetrios Bautista(brother), and Gregory Bautista(daughter) on speaker phone in the later morning. We reviewed each section of the MOST form. We together, completed this per Gregory Bautista's wishes as below.   Cardiopulmonary Resuscitation: Do Not Attempt Resuscitation (DNR/No CPR)  Medical Interventions: Comfort Measures: Keep clean, warm, and dry. Use medication by any route, positioning, wound care, and other  measures to relieve pain and suffering. Use oxygen, suction and manual treatment of airway obstruction as needed for comfort. Do not transfer to the hospital unless comfort needs cannot be met in current location.  Antibiotics: Determine use of limitation of antibiotics when infection occurs  IV Fluids: IV fluids if indicated  Feeding Tube: Feeding tube long-term if indicated   We reviewed the Advance Directive papers. Gregory Bautista completed to [facility]., He did not feel that completion of the MOST form was needed in the setting of having completed the MOST form and feeling that there was a degree of redundancy.   We then discussed the plan for discharge. Gregory Bautista shares that there is no one local who can help care for HKyshaunand he is concerned that he may not fair well at home alone. He also brought up concerns as HDerrichif he goes to a SNF would not be able to get radiation therapy / chemotherapy. We discussed these topics in greater detail. The patient himself at this point in time has made it clear that he would want to continue these therapies. If his clinical state were to worsen he would be open to Hospice. We discussed hospice in greater detail.   Discussed the importance of continued conversation with family and their  medical providers regarding overall plan of care and treatment options, ensuring decisions are within the context of the patients values and GOCs.  Questions and concerns addressed   Objective Assessment: Vital Signs Vitals:   01/30/20 0606 01/30/20 1021  BP: 104/65 92/65  Pulse: (!) 107 96  Resp:  20  Temp:  98.6 F (37 C)  SpO2:  100%    Intake/Output Summary (Last 24 hours) at 01/30/2020 1246 Last data filed at 01/30/2020 0400 Gross per 24 hour  Intake --  Output 750 ml  Net -750 ml   Last Weight  Most recent update: 01/22/2020  4:45 AM   Weight  68.9 kg (151 lb 14.4 oz)           Gen:  Older AA M in NAD HEENT: moist mucous membranes CV: Regular rate and  rhythm  PULM: clear to auscultation bilaterally  ABD: soft/nontender  EXT: No edema  Neuro: Alert and oriented x3-4  SUMMARY OF RECOMMENDATIONS DNAR/DNI  MOST Completed, paper copy placed onto the chart electric copy can be found in Gregory Bautista  DNR Form Completed, paper copy placed onto the chart electric copy can be found in Gregory Bautista  Patient would like to continue with treatments for lung CA as of now  Outpatient Palliative Support ordered  Spiritual support - Advance Directives  Discussed with Dr. Dagoberto Bautista  Time In: 1030 Time Out: 1130 Time Spent: 60 Greater than 50% of the time was spent in counseling and coordination of care ______________________________________________________________________________________ Gregory Bautista Bautista Cell Phone: [phone] Please utilize secure chat with additional questions, if there is no response within 30 minutes please call the above phone number  Palliative Medicine Bautista providers are available by phone from 7am to 7pm daily and can be reached through the Bautista cell phone.  Should this patient require assistance outside of these hours, please call the patient's attending physician.

## 2020-01-30 NOTE — Plan of Care (Signed)
  Problem: Consults Goal: RH SPINAL CORD INJURY PATIENT EDUCATION Description:  See Patient Education module for education specifics.  Outcome: Progressing   Problem: SCI BOWEL ELIMINATION Goal: RH STG MANAGE BOWEL WITH ASSISTANCE Description: STG Manage Bowel with  Min Assistance. Outcome: Progressing Goal: RH STG SCI MANAGE BOWEL WITH MEDICATION WITH ASSISTANCE Description: STG SCI Manage bowel with medication with Min assistance. Outcome: Progressing Goal: RH STG SCI MANAGE BOWEL PROGRAM W/ASSIST OR AS APPROPRIATE Description: STG SCI Manage bowel program w/ Min assist or as appropriate. Outcome: Progressing   Problem: SCI BLADDER ELIMINATION Goal: RH STG MANAGE BLADDER WITH ASSISTANCE Description: STG Manage Bladder With Max Assistance Outcome: Progressing   Problem: RH SKIN INTEGRITY Goal: RH STG ABLE TO PERFORM INCISION/WOUND CARE W/ASSISTANCE Description: STG Able To Perform Incision/Wound Care With [employer]. Outcome: Progressing   Problem: RH PAIN MANAGEMENT Goal: RH STG PAIN MANAGED AT OR BELOW PT'S PAIN GOAL Description: < 4 out of 10.  Outcome: Progressing   Problem: RH KNOWLEDGE DEFICIT SCI Goal: RH STG INCREASE KNOWLEDGE OF SELF CARE AFTER SCI Description: Pt will demonstrate increased knowledge of how to care for self after spinal cord surgery including precautions, medication, bowel and bladder management, and prevention of complications with min assist at discharge.  Outcome: Progressing

## 2020-01-30 NOTE — Progress Notes (Signed)
[facility] [place] PHYSICAL MEDICINE & REHABILITATION PROGRESS NOTE   Subjective/Complaints:  Nurse reports no success with bowel program last night but that he had success the 3 nights prior. Pt without complaints today  ROS: Patient denies fever, rash, sore throat, blurred vision, nausea, vomiting, diarrhea, cough, shortness of breath or chest pain, joint or back pain, headache, or mood change.    Objective:   DG Swallowing Func-Speech Pathology  Result Date: 01/29/2020 Objective Swallowing Evaluation: Type of Study: MBS-Modified Barium Swallow Study  Patient Details Name: Gregory Bautista MRN: [id number] Date of Birth: December 06, 1940 Today's Date: 01/29/2020 Time: No data recorded-No data recorded No data recorded Past Medical History: Past Medical History: Diagnosis Date . Allergic rhinitis   NEC . Asthma  . Glaucoma  . Lung cancer, main bronchus ([facility]) 12/2019 . Tobacco use disorder 09/24/2019 Past Surgical History: Past Surgical History: Procedure Laterality Date . COLON SURGERY   . EYE SURGERY Right  . LAMINECTOMY WITH POSTERIOR LATERAL ARTHRODESIS LEVEL 4 N/A 12/30/2019  Procedure: THORACIC THREE LAMINECTOMY, THORACIC INSTRUMENTATION AND FUSION THORACIC ONE-FIVE;  Surgeon: Newman Pies, MD;  Location: [facility];  Service: Neurosurgery;  Laterality: N/A; HPI: see H&P  No data recorded Assessment / Plan / Recommendation CHL IP CLINICAL IMPRESSIONS 01/29/2020 Clinical Impression Patient presents with a moderate oral and mild pharyngeal dysphagia. Oral phase of swallow consists of delayed anterior to posterior transit of all boluses (heavier boluses leading to longer delays), decreased bolus cohesion and piecemeal swallowing with mechanical soft solids, decreased mastication of mechanical soft solids. Pharyngeal phase consisted of minimal instances of delays of swallow initiation to vallecular sinus with thin liquids, mild amount of pyriform residuals and trace to mild vallecular sinus residuals with thin  liquids,mechanical soft solids. 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 No penetration or aspiration events observed. SLP Visit Diagnosis Dysphagia, oropharyngeal phase (R13.12) Attention and concentration deficit following -- Frontal lobe and executive function deficit following -- Impact on safety and function No limitations   No flowsheet data found.  No flowsheet data found. CHL IP DIET RECOMMENDATION 01/29/2020 SLP Diet Recommendations Dysphagia 1 (Puree) solids;Thin liquid Liquid Administration via Cup;Straw Medication Administration Whole meds with puree Compensations Minimize environmental distractions;Slow rate;Small sips/bites;Follow solids with liquid Postural Changes Remain semi-upright after after feeds/meals (Comment)   CHL IP OTHER RECOMMENDATIONS 01/29/2020 Recommended Consults -- Oral Care Recommendations Oral care BID Other Recommendations --   CHL IP FOLLOW UP RECOMMENDATIONS 01/29/2020 Follow up Recommendations None   No flowsheet data found.     CHL IP ORAL PHASE 01/29/2020 Oral Phase Impaired Oral - Pudding Teaspoon -- Oral - Pudding Cup -- Oral - Honey Teaspoon -- Oral - Honey Cup -- Oral - Nectar Teaspoon -- Oral - Nectar Cup -- Oral - Nectar Straw -- Oral - Thin Teaspoon -- Oral - Thin Cup Reduced posterior propulsion;Delayed oral transit Oral - Thin Straw -- Oral - Puree Piecemeal swallowing;Reduced posterior propulsion;Delayed oral transit Oral - Mech Soft Impaired mastication;Weak lingual manipulation;Piecemeal swallowing;Delayed oral transit;Reduced posterior propulsion;Holding of bolus Oral - Regular -- Oral - Multi-Consistency -- Oral - Pill Decreased bolus cohesion;Reduced posterior propulsion;Weak lingual manipulation;Delayed oral transit Oral Phase - Comment --  CHL IP PHARYNGEAL PHASE 01/29/2020 Pharyngeal Phase Impaired Pharyngeal- Pudding Teaspoon -- Pharyngeal -- Pharyngeal- Pudding Cup -- Pharyngeal -- Pharyngeal- Honey  Teaspoon -- Pharyngeal -- Pharyngeal- Honey Cup -- Pharyngeal -- Pharyngeal- Nectar Teaspoon -- Pharyngeal -- Pharyngeal- Nectar Cup -- Pharyngeal -- Pharyngeal- Nectar Straw -- Pharyngeal --  Pharyngeal- Thin Teaspoon -- Pharyngeal -- Pharyngeal- Thin Cup Delayed swallow initiation-vallecula;Pharyngeal residue - pyriform Pharyngeal -- Pharyngeal- Thin Straw Delayed swallow initiation-vallecula Pharyngeal -- Pharyngeal- Puree WFL Pharyngeal -- Pharyngeal- Mechanical Soft Pharyngeal residue - valleculae Pharyngeal -- Pharyngeal- Regular -- Pharyngeal -- Pharyngeal- Multi-consistency -- Pharyngeal -- Pharyngeal- Pill WFL Pharyngeal -- Pharyngeal Comment --  CHL IP CERVICAL ESOPHAGEAL PHASE 01/29/2020 Cervical Esophageal Phase Impaired Pudding Teaspoon -- Pudding Cup -- Honey Teaspoon -- Honey Cup -- Nectar Teaspoon -- Nectar Cup -- Nectar Straw -- Thin Teaspoon -- Thin Cup -- Thin Straw -- Puree Prominent cricopharyngeal segment Mechanical Soft Prominent cricopharyngeal segment Regular -- Multi-consistency -- Pill -- Cervical Esophageal Comment -- Sonia Baller, MA, CCC-SLP Speech Therapy             Recent Labs    01/29/20 1059  WBC 5.9  HGB 10.4*  HCT 33.7*  PLT 270   Recent Labs    01/29/20 1059  NA 135  K 4.1  CL 98  CO2 28  GLUCOSE 109*  BUN 21  CREATININE 0.94  CALCIUM 11.4*    Intake/Output Summary (Last 24 hours) at 01/30/2020 0832 Last data filed at 01/30/2020 0400 Gross per 24 hour  Intake --  Output 750 ml  Net -750 ml    Physical Exam: Vital Signs Blood pressure 104/65, pulse (!) 107, temperature 98.4 F (36.9 C), resp. rate 20, height 5\' 9"  (1.753 m), weight 68.9 kg, SpO2 100 %. Constitutional: No distress . Vital signs reviewed. HEENT: EOMI, oral membranes moist Neck: supple Cardiovascular: tachy without murmur. No JVD    Respiratory/Chest: CTA Bilaterally without wheezes or rales. Normal effort    GI/Abdomen: BS +, non-tender, non-distended Ext: no clubbing, cyanosis,  or edema Psych: pleasant but flat Skin: Warm and dry.  Intact. Musc: No edema in extremities.  No tenderness in extremities. Neuro: Alert but decreased memory. Limited insight Motor: Bilateral upper extremities: Grossly 5/5 proximal distal Bilateral lower extremities: Grossly 4+/5 proximal to distal, stable  Assessment/Plan: 1. Functional deficits which require 3+ hours per day of interdisciplinary therapy in a comprehensive inpatient rehab setting.  Physiatrist is providing close team supervision and 24 hour management of active medical problems listed below.  Physiatrist and rehab team continue to assess barriers to discharge/monitor patient progress toward functional and medical goals  Care Tool:  Bathing    Body parts bathed by patient: Right arm,Left arm,Chest,Abdomen,Right upper leg,Left upper leg,Face,Front perineal area   Body parts bathed by helper: Buttocks,Right lower leg,Left lower leg Body parts n/a: Front perineal area,Buttocks,Right lower leg,Left lower leg   Bathing assist Assist Level: Minimal Assistance - Patient > 75%     Upper Body Dressing/Undressing Upper body dressing   What is the patient wearing?: Pull over shirt    Upper body assist Assist Level: Set up assist    Lower Body Dressing/Undressing Lower body dressing      What is the patient wearing?: Incontinence brief     Lower body assist Assist for lower body dressing: Total Assistance - Patient < 25%     Toileting Toileting    Toileting assist Assist for toileting: Moderate Assistance - Patient 50 - 74% Assistive Device Comment: BSC   Transfers Chair/bed transfer  Transfers assist     Chair/bed transfer assist level: Contact Guard/Touching assist     Locomotion Ambulation   Ambulation assist      Assist level: Minimal Assistance - Patient > 75% Assistive device: Walker-rolling Max distance: 18'   Walk  10 feet activity   Assist     Assist level: Minimal Assistance -  Patient > 75% Assistive device: Walker-rolling   Walk 50 feet activity   Assist Walk 50 feet with 2 turns activity did not occur: Safety/medical concerns  Assist level: Minimal Assistance - Patient > 75% Assistive device: Walker-rolling    Walk 150 feet activity   Assist Walk 150 feet activity did not occur: Safety/medical concerns         Walk 10 feet on uneven surface  activity   Assist Walk 10 feet on uneven surfaces activity did not occur: Safety/medical concerns         Wheelchair     Assist Will patient use wheelchair at discharge?: Yes Type of Wheelchair: Manual    Wheelchair assist level: Supervision/Verbal cueing Max wheelchair distance: 100'    Wheelchair 50 feet with 2 turns activity    Assist        Assist Level: Supervision/Verbal cueing   Wheelchair 150 feet activity     Assist      Assist Level: Moderate Assistance - Patient 50 - 74%   Blood pressure 104/65, pulse (!) 107, temperature 98.4 F (36.9 C), resp. rate 20, height 5\' 9"  (1.753 m), weight 68.9 kg, SpO2 100 %.  Medical Problem List and Plan: 1.  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  1/7- spoke to brother yesterday who said he'd pass on to daughter- sounds like he didn't- palliative care to see pt  Continue CIR  2.  Antithrombotics: -DVT/anticoagulation:   Heparin.  Mechanical: Sequential compression devices, below knee Bilateral lower extremities   Dopplers negative for DVT  -antiplatelet therapy: N/A 3. Pain Management:    Decreased oxycodone to q4H prn.  DC gabapentin since causing constipation  Added Cymbalta 30 mg daily.   1/3- will increase Duloxetine to 60  mg nightly- for nerve pain  1/5- not taking pain meds often- encouraged pt to take pain meds before therapy.   1/6- will reduce oxy to Norco q4 hours prn due to increased confusion per family (nursing hasn't seen it)  with pain meds  1/7-8- still rarely taking pain meds 4. Mood: LCSW to follow for evaluation and support.              -antipsychotic agents: N/a 5. Neuropsych: This patient is capable of making decisions on his own behalf.  Appreciate neuropsych 6. Skin/Wound Care: Routine pressure relief measures.  7. Fluids/Electrolytes/Nutrition: Monitor I/Os.             Added juven to promote healing.  8. Neurogenic bladder: Foley in place due to urinary retention/UTI, with plans to remove on Monday  Flomax daily initiated on 12/30, monitor blood pressure closely  Florinef 0.1 mg daily initiated to assist with low BP.   Improving on 1/2  1/3- will d/c foley this afternoon, after radiation  1/4- wasn't removed- will remove today- this AM and ordered I/o caths and PVRs since might not void.  1/5- so far, no voiding- cathing required 100%   1/6-8- no voiding- caths volumes 300-600c q6 hours 9.  Acute renal failure: Resolved  Creatinine 0.86 on 12/27  10. Acute blood loss anemia:  Hemoglobin 12.4 on 12/28  Continue to monitor 11. Metastatic Lung AdenoCA:  On dulera--has been on supplemental oxygen  1/4- didn't get radiation yesterday- is scheduled ot go back today.  1/6- Getting radiation daily M-F  12. Kleb/Proteus UTI: Completed course of Cipro 13. 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  1/5- refused bowel program last night  And as a result had accident this AM.   1/6- hard  clumps of stool- will add Miralax daily.   1/7- hard clumps- just added miralax so will wait to increase til this weekend.   1/8 no bm last night, program inconsistent, miralax just started 1/7--obsv 14. Hypotension-   Severe orthostasis, negative on 12/31  Relatively controlled on 1/2  TEDs/abdominal binder for support.   1/5- added midodrine 5 mg TID-AC for low Bps  1/7- increased Midodrine to 10 mg TID- will also increase Florinef to help with orthostatic hypotension. It's really bad when goes to radiation  1/8 bp heartier with recent changes  15.  Sleep disturbance: Scheduled melatonin at nights.   Trazodone 100  1/3- sleeping well- con't regimen 17. Tenting/mild dehydration- will push 6 cups/water daily minimum  1/6- skin very dry- order eucerin AND push PO fluids 18. Hyperkalemia  Lokelma ordered on 12/23  Potassium 4.6 on 12/27 19. Pancoast tumor/ metastatic lung cancer:   Radiation to begin on 1/3 and to see Dr Julien Nordmann 1/3 at 2:15 pm  1/4- didn't get radiation- will make sure scheduled to  go back today  1/5- made sure pt went to radiation -his BP was low- but gave him 500cc IVFs NS bolus to help with BP- BP was 82/50s.   1/7- Increased Midodrine and florienf to help with low BP.  20. Dysphagia per pt  12/30- ordered SLP eval and treat  1/3- placed on D3 thin diet  1/4- is better when sitting up at 90 degrees  1/6- will add Protonix for reflux Sx's.   1/8 MBS performed yesterday--mod oral/mild pharyng dysphagia   -D1, thins recommended by SLP, meds with puree 21. Hypercalcemia  1/4- is down to 11 from 11.2- will monitor- if goes back up more, will treat.        LOS: 18 days A FACE TO FACE EVALUATION WAS PERFORMED  Meredith Staggers 01/30/2020, 8:32 AM

## 2020-01-30 NOTE — Progress Notes (Signed)
Physical Therapy Session Note  Patient Details  Name: Gregory Bautista MRN: [id number] Date of Birth: 03-09-1940  Today's Date: 01/30/2020 PT Individual Time: 0254-2706 PT Individual Time Calculation (min): 58 min   Short Term Goals: Week 3:  PT Short Term Goal 1 (Week 3): =LTG due to ELOS  Skilled Therapeutic Interventions/Progress Updates:    Pt received sitting in recliner with NT present for vital assessment and pt agreeable to therapy session. Pt noted to have chocolate Ensure sitting on his table - therapist encouraged consumption and on 1st swallow pt overfilled mouth and had to spit it out into trash can (this happened 1 additional time) but pt was able to consume entirety of drink with no signs of aspiration, cuing for small sips. Received and maintained on 2L of O2 via nasal cannula throughout session. Sitting vitals: BP 99/61 (MAP 74), HR 110bpm, SpO2 100%. Donned B LE thigh high TED hose max assist - unable to locate abdominal binder, Caryl Pina RN notified and ordered another and delivered it later in session. L stand pivot recliner>w/c, no AD but using armrest support, min assist for balance. Reassessed vitals: BP 91/55 (MAP 66), HR 109bpm ranging to 115bpm, SpO2 100%.  Transported to/from gym in w/c for time management and energy conservation. Stand pivot w/c>[name] using UE support on armrests with min assist for balance and pivoting hips. Performed B UE and B LE reciprocal movements on [name] targeting endurance/activity tolerance against level 4 resistance for 84min45sec totaling 108steps then 14min totaling 69 steps as pt becoming tearful and reflective during 2nd round - therapist provided pt's selection of music during [name] for emotional support and motivation ([employer]). Pt reports he was in a gospel singing group and believes in God - therapist offered chaplain services but pt politely declined. Vitals after [name]: BP 110/77 (MAP 87), HR 117bpm, SpO2 100%  Stand pivot  back to w/c as described above. Transported to breezeway to allow pt view of sunset and for change of scenery to provide emotional support. Pt states that his wishes are to enjoy what time he has left - therapist provided emotional support and discussed D/C plans. Pt reports he believes the plan is for him to D/C to his home and that he does not have family locally to provide support - discussed utilizing community resources to help with meals being delivered to him. Transported back to room and pt agreeable to remain sitting up in w/c and attempt eating dinner - left with needs in reach, seat belt alarm on, and NT arriving with meal tray.   Therapy Documentation Precautions:  Precautions Precautions: Back,Fall Precaution Comments: reviewed precautions Restrictions Weight Bearing Restrictions: No  Pain:   No reports of pain throughout session.   Therapy/Group: Individual Therapy   Tawana Scale , PT, DPT, CSRS  01/30/2020, 3:41 PM

## 2020-01-30 NOTE — Progress Notes (Signed)
Occupational Therapy Session Note  Patient Details  Name: Gregory Bautista MRN: [id number] Date of Birth: 1940/07/29  Today's Date: 01/30/2020 OT Individual Time: 1432-1440 OT Individual Time Calculation (min): 8 min    Short Term Goals: Week 1:  OT Short Term Goal 1 (Week 1): Pt will be able to stand from EOB to Rw with CGA to prepare for LB self care. OT Short Term Goal 1 - Progress (Week 1): Progressing toward goal OT Short Term Goal 2 (Week 1): Pt will transfer to toilet with min A. OT Short Term Goal 2 - Progress (Week 1): Progressing toward goal OT Short Term Goal 3 (Week 1): Pt will complete toileting with min A. OT Short Term Goal 3 - Progress (Week 1): Progressing toward goal OT Short Term Goal 4 (Week 1): Pt will don pants with min a. OT Short Term Goal 4 - Progress (Week 1): Progressing toward goal OT Short Term Goal 5 (Week 1): Pt will be able to tolerate standing for 5 min with RW with CGA. OT Short Term Goal 5 - Progress (Week 1): Progressing toward goal  Skilled Therapeutic Interventions/Progress Updates:    1;1. Pt received in recliner with lunch tray in front of pt. Pt states he feels, "terrible" Conversation to build rapport about roles/responsibilitites and leisure persuits initiated but pt states, "im just getting my thoughts together to make me, me again.: provided ADL, toileting, exercise and other tx options but pt pleasantly declines. Pt educated on importance of PO intake to keep energy and strength improving. Pt verbalized understanding. Pt missed 37 min of OT tx d/t fatigue/refusal  Therapy Documentation Precautions:  Precautions Precautions: Back,Fall Precaution Comments: reviewed precautions Restrictions Weight Bearing Restrictions: No General:   Vital Signs: Therapy Vitals Temp: 98.6 F (37 C) Temp Source: Oral Pulse Rate: 96 Resp: 20 BP: 92/65 Patient Position (if appropriate): Sitting Oxygen Therapy SpO2: 100 % O2 Device: Nasal Cannula O2  Flow Rate (L/min): 2 L/min Pain:   ADL: ADL Eating: Set up Grooming: Setup Where Assessed-Grooming: Sitting at sink Upper Body Bathing: Supervision/safety Where Assessed-Upper Body Bathing: Sitting at sink Lower Body Bathing: Moderate assistance Where Assessed-Lower Body Bathing: Standing at sink,Sitting at sink Upper Body Dressing: Supervision/safety Where Assessed-Upper Body Dressing: Sitting at sink Lower Body Dressing: Moderate assistance Where Assessed-Lower Body Dressing: Wheelchair Toileting: Moderate assistance Where Assessed-Toileting: Glass blower/designer: Psychiatric nurse Method: Arts development officer: Raised toilet seat Vision   Perception    Praxis   Exercises:   Other Treatments:     Therapy/Group: Individual Therapy  Tonny Branch 01/30/2020, 12:39 PM

## 2020-01-30 NOTE — Progress Notes (Signed)
Bowel program started at 1821. Dig stim done prior to insertion. Small amount of hard stool removed. Suppository inserted and patient positioned side lying. Report given to oncoming shift.

## 2020-01-31 ENCOUNTER — Inpatient Hospital Stay (HOSPITAL_COMMUNITY): Payer: Medicare Other | Admitting: Physical Therapy

## 2020-01-31 ENCOUNTER — Inpatient Hospital Stay (HOSPITAL_COMMUNITY): Payer: Medicare Other | Admitting: Speech Pathology

## 2020-01-31 ENCOUNTER — Inpatient Hospital Stay (HOSPITAL_COMMUNITY): Payer: Medicare Other | Admitting: Occupational Therapy

## 2020-01-31 MED ORDER — POLYETHYLENE GLYCOL 3350 17 G PO PACK
17.0000 g | PACK | Freq: Two times a day (BID) | ORAL | Status: DC
  Administered 2020-01-31 – 2020-02-10 (×13): 17 g via ORAL
  Filled 2020-01-31 (×18): qty 1

## 2020-01-31 MED ORDER — PSYLLIUM 95 % PO PACK
1.0000 | PACK | Freq: Every day | ORAL | Status: DC
  Administered 2020-01-31 – 2020-02-15 (×7): 1 via ORAL
  Filled 2020-01-31 (×17): qty 1

## 2020-01-31 NOTE — Progress Notes (Signed)
Occupational Therapy Session Note  Patient Details  Name: Chaden Doom MRN: [id number] Date of Birth: 01/27/1940  Today's Date: 01/31/2020 OT Individual Time: 4496-7591 OT Individual Time Calculation (min): 55 min    Short Term Goals: Week 2:  OT Short Term Goal 1 (Week 2): Pt will be able to stand from EOB to Rw with CGA to prepare for LB self care. OT Short Term Goal 1 - Progress (Week 2): Met OT Short Term Goal 2 (Week 2): Pt will transfer to toilet with min A. OT Short Term Goal 2 - Progress (Week 2): Met OT Short Term Goal 3 (Week 2): Pt will complete toileting with min A. OT Short Term Goal 3 - Progress (Week 2): Met OT Short Term Goal 4 (Week 2): Pt will don pants with min a. OT Short Term Goal 4 - Progress (Week 2): Progressing toward goal OT Short Term Goal 5 (Week 2): Pt will be able to tolerate standing for 5 min with RW with CGA. OT Short Term Goal 5 - Progress (Week 2): Met  Skilled Therapeutic Interventions/Progress Updates:    Patient in bed, alert, denies pain, is pleasant and cooperative.  2L O2 via [place] t/o session (not in nose at start of session - requires assistance to adjust)    Supine to sitting edge of bed with CGA.  SPT to/from bed, w/c and toilet with min A.  Completed bathing tasks w/c level with mod a and increased time.  OH shirt min A, incontinence brief and pants max A.  Toileting/hygiene with max A.  Min A for oral care.  Returned to bed at close of session, stating that he felt better after washing and changing clothes but was fatigued.  He required min A for sit to supine, bed alarm set and call bell in reach.    Therapy Documentation Precautions:  Precautions Precautions: Back,Fall Precaution Comments: reviewed precautions Restrictions Weight Bearing Restrictions: No  Therapy/Group: Individual Therapy  Carlos Levering 01/31/2020, 7:43 AM

## 2020-01-31 NOTE — Progress Notes (Signed)
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 Tumor was not felt to be resectable due to advanced disease and spine stabilization recommended with plans for chemo and XRT in the future. He was evaluated by Dr. Arnoldo Morale and was admitted on 12/30/2019 for T2-T4 laminectomy for tumor resection with T1-T5 posterior arthrodesis with pedicle screws and rods. 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 Plans are for XRT by Dr. Randa Ngo with reports to of simulation set for 12/27. Had been set for follow up with Dr. Earlie Server on outpatient basis.   Palliative care was asked to get involved in the setting of metastatic lung cancer and the patient no longer wishing to continue treatments.  Today's Discussion (01/31/2020): Chart reviewed.   I met Gregory Bautista at bedside this morning. He was awake and in positive spirits. We discussed our conversation from yesterday. He states confirmation and understanding of what was discussed. I provided him the opportunity to ask additional questions and concerns. These were addressed accordingly.   Shared that tomorrow our chaplain will be back - we are hopeful to notarize needed documents then.   Discussed the importance of continued conversation with family and their  medical providers regarding overall plan of care and treatment options, ensuring decisions are within the context of the patients  values and GOCs.  Objective Assessment: Vital Signs Vitals:   01/31/20 0458 01/31/20 0810  BP: 99/63 96/79  Pulse: (!) 119 (!) 117  Resp: 18 18  Temp: 98.2 F (36.8 C) 98 F (36.7 C)  SpO2: 97% 96%    Intake/Output Summary (Last 24 hours) at 01/31/2020 0827 Last data filed at 01/31/2020 0025 Gross per 24 hour  Intake 120 ml  Output 726 ml  Net -606 ml   Last Weight  Most recent update: 01/22/2020  4:45 AM   Weight  68.9 kg (151 lb 14.4 oz)           Gen:  Older AA M in NAD HEENT: moist mucous membranes CV: Regular rate and rhythm  PULM: clear to auscultation bilaterally  ABD: soft/nontender  EXT: No edema  Neuro: Alert and oriented x3-4  SUMMARY OF RECOMMENDATIONS DNAR/DNI  MOST Completed, paper copy placed onto the chart electric copy can be found in [place]  DNR Form Completed, paper copy placed onto the chart electric copy can be found in [facility]  Patient would like to continue with treatments for lung CA as of now  Outpatient Palliative Support ordered  Spiritual support - Advance Directives  Time Spent: 15 Greater than 50% of the time was spent in counseling and coordination of care ______________________________________________________________________________________ [facility] Team Team Cell Phone: [phone] Please utilize secure chat with additional questions, if there is no response within 30 minutes please call the above phone number  Palliative Medicine Team providers are available by phone from 7am to 7pm daily and can be reached through the team  cell phone.  Should this patient require assistance outside of these hours, please call the patient's attending physician.

## 2020-01-31 NOTE — Progress Notes (Signed)
Patient has 1 small incontinent stool with therapy today. Small amount of hard stool (pebble like) removed from rectal vault prior to suppository insertion this evening. Dig stim x2. Resulted small amount of semi hard formed stool.

## 2020-01-31 NOTE — Progress Notes (Signed)
Bowel program completed. Pt has small pebble hard stool. Dig stim completed.

## 2020-01-31 NOTE — Progress Notes (Signed)
[place] PHYSICAL MEDICINE & REHABILITATION PROGRESS NOTE   Subjective/Complaints:  Pt states he had results with bowel program last night. Nurse reports only small pebble(s).  Pt denies pain  ROS: Patient denies fever, rash, sore throat, blurred vision, nausea, vomiting, diarrhea, cough, shortness of breath or chest pain, joint or back pain, headache, or mood change.    Objective:   DG Swallowing Func-Speech Pathology  Result Date: 01/29/2020 Objective Swallowing Evaluation: Type of Study: MBS-Modified Barium Swallow Study  Patient Details Name: Gregory Bautista MRN: [id number] Date of Birth: April 17, 1940 Today's Date: 01/29/2020 Time: No data recorded-No data recorded No data recorded Past Medical History: Past Medical History: Diagnosis Date . Allergic rhinitis   NEC . Asthma  . Glaucoma  . Lung cancer, main bronchus ([facility]) 12/2019 . Tobacco use disorder 09/24/2019 Past Surgical History: Past Surgical History: Procedure Laterality Date . COLON SURGERY   . EYE SURGERY Right  . LAMINECTOMY WITH POSTERIOR LATERAL ARTHRODESIS LEVEL 4 N/A 12/30/2019  Procedure: THORACIC THREE LAMINECTOMY, THORACIC INSTRUMENTATION AND FUSION THORACIC ONE-FIVE;  Surgeon: Newman Pies, MD;  Location: [facility];  Service: Neurosurgery;  Laterality: N/A; HPI: see H&P  No data recorded Assessment / Plan / Recommendation CHL IP CLINICAL IMPRESSIONS 01/29/2020 Clinical Impression Patient presents with a moderate oral and mild pharyngeal dysphagia. Oral phase of swallow consists of delayed anterior to posterior transit of all boluses (heavier boluses leading to longer delays), decreased bolus cohesion and piecemeal swallowing with mechanical soft solids, decreased mastication of mechanical soft solids. Pharyngeal phase consisted of minimal instances of delays of swallow initiation to vallecular sinus with thin liquids, mild amount of pyriform residuals and trace to mild vallecular sinus residuals with thin liquids,mechanical soft solids.  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 No penetration or aspiration events observed. SLP Visit Diagnosis Dysphagia, oropharyngeal phase (R13.12) Attention and concentration deficit following -- Frontal lobe and executive function deficit following -- Impact on safety and function No limitations   No flowsheet data found.  No flowsheet data found. CHL IP DIET RECOMMENDATION 01/29/2020 SLP Diet Recommendations Dysphagia 1 (Puree) solids;Thin liquid Liquid Administration via Cup;Straw Medication Administration Whole meds with puree Compensations Minimize environmental distractions;Slow rate;Small sips/bites;Follow solids with liquid Postural Changes Remain semi-upright after after feeds/meals (Comment)   CHL IP OTHER RECOMMENDATIONS 01/29/2020 Recommended Consults -- Oral Care Recommendations Oral care BID Other Recommendations --   CHL IP FOLLOW UP RECOMMENDATIONS 01/29/2020 Follow up Recommendations None   No flowsheet data found.     CHL IP ORAL PHASE 01/29/2020 Oral Phase Impaired Oral - Pudding Teaspoon -- Oral - Pudding Cup -- Oral - Honey Teaspoon -- Oral - Honey Cup -- Oral - Nectar Teaspoon -- Oral - Nectar Cup -- Oral - Nectar Straw -- Oral - Thin Teaspoon -- Oral - Thin Cup Reduced posterior propulsion;Delayed oral transit Oral - Thin Straw -- Oral - Puree Piecemeal swallowing;Reduced posterior propulsion;Delayed oral transit Oral - Mech Soft Impaired mastication;Weak lingual manipulation;Piecemeal swallowing;Delayed oral transit;Reduced posterior propulsion;Holding of bolus Oral - Regular -- Oral - Multi-Consistency -- Oral - Pill Decreased bolus cohesion;Reduced posterior propulsion;Weak lingual manipulation;Delayed oral transit Oral Phase - Comment --  CHL IP PHARYNGEAL PHASE 01/29/2020 Pharyngeal Phase Impaired Pharyngeal- Pudding Teaspoon -- Pharyngeal -- Pharyngeal- Pudding Cup -- Pharyngeal -- Pharyngeal- Honey Teaspoon -- Pharyngeal --  Pharyngeal- Honey Cup -- Pharyngeal -- Pharyngeal- Nectar Teaspoon -- Pharyngeal -- Pharyngeal- Nectar Cup -- Pharyngeal -- Pharyngeal- Nectar Straw -- Pharyngeal -- Pharyngeal- Thin Teaspoon --  Pharyngeal -- Pharyngeal- Thin Cup Delayed swallow initiation-vallecula;Pharyngeal residue - pyriform Pharyngeal -- Pharyngeal- Thin Straw Delayed swallow initiation-vallecula Pharyngeal -- Pharyngeal- Puree WFL Pharyngeal -- Pharyngeal- Mechanical Soft Pharyngeal residue - valleculae Pharyngeal -- Pharyngeal- Regular -- Pharyngeal -- Pharyngeal- Multi-consistency -- Pharyngeal -- Pharyngeal- Pill WFL Pharyngeal -- Pharyngeal Comment --  CHL IP CERVICAL ESOPHAGEAL PHASE 01/29/2020 Cervical Esophageal Phase Impaired Pudding Teaspoon -- Pudding Cup -- Honey Teaspoon -- Honey Cup -- Nectar Teaspoon -- Nectar Cup -- Nectar Straw -- Thin Teaspoon -- Thin Cup -- Thin Straw -- Puree Prominent cricopharyngeal segment Mechanical Soft Prominent cricopharyngeal segment Regular -- Multi-consistency -- Pill -- Cervical Esophageal Comment -- Sonia Baller, MA, CCC-SLP Speech Therapy             Recent Labs    01/29/20 1059  WBC 5.9  HGB 10.4*  HCT 33.7*  PLT 270   Recent Labs    01/29/20 1059  NA 135  K 4.1  CL 98  CO2 28  GLUCOSE 109*  BUN 21  CREATININE 0.94  CALCIUM 11.4*    Intake/Output Summary (Last 24 hours) at 01/31/2020 0808 Last data filed at 01/31/2020 0025 Gross per 24 hour  Intake 120 ml  Output 726 ml  Net -606 ml    Physical Exam: Vital Signs Blood pressure 99/63, pulse (!) 119, temperature 98.2 F (36.8 C), resp. rate 18, height 5\' 9"  (1.753 m), weight 68.9 kg, SpO2 97 %. Constitutional: No distress . Vital signs reviewed. HEENT: EOMI, oral membranes moist Neck: supple Cardiovascular: RRR without murmur. No JVD    Respiratory/Chest: CTA Bilaterally without wheezes or rales. Normal effort    GI/Abdomen: BS +, non-tender, non-distended Ext: no clubbing, cyanosis, or edema Psych: pleasant  and cooperative Skin: Warm and dry.  Intact. Musc: No edema in extremities.  No tenderness in extremities. Neuro: Alert but decreased memory. Limited insight Motor: Bilateral upper extremities: Grossly 5/5 proximal distal Bilateral lower extremities: Grossly 4+/5 proximal to distal, stable  Assessment/Plan: 1. Functional deficits which require 3+ hours per day of interdisciplinary therapy in a comprehensive inpatient rehab setting.  Physiatrist is providing close team supervision and 24 hour management of active medical problems listed below.  Physiatrist and rehab team continue to assess barriers to discharge/monitor patient progress toward functional and medical goals  Care Tool:  Bathing    Body parts bathed by patient: Right arm,Left arm,Chest,Abdomen,Right upper leg,Left upper leg,Face,Front perineal area   Body parts bathed by helper: Buttocks,Right lower leg,Left lower leg Body parts n/a: Front perineal area,Buttocks,Right lower leg,Left lower leg   Bathing assist Assist Level: Minimal Assistance - Patient > 75%     Upper Body Dressing/Undressing Upper body dressing   What is the patient wearing?: Pull over shirt    Upper body assist Assist Level: Set up assist    Lower Body Dressing/Undressing Lower body dressing      What is the patient wearing?: Incontinence brief     Lower body assist Assist for lower body dressing: Total Assistance - Patient < 25%     Toileting Toileting    Toileting assist Assist for toileting: Moderate Assistance - Patient 50 - 74% Assistive Device Comment: BSC   Transfers Chair/bed transfer  Transfers assist     Chair/bed transfer assist level: Minimal Assistance - Patient > 75% Chair/bed transfer assistive device: Other (none)   Locomotion Ambulation   Ambulation assist      Assist level: Minimal Assistance - Patient > 75% Assistive device: Walker-rolling Max distance:  60'   Walk 10 feet activity   Assist      Assist level: Minimal Assistance - Patient > 75% Assistive device: Walker-rolling   Walk 50 feet activity   Assist Walk 50 feet with 2 turns activity did not occur: Safety/medical concerns  Assist level: Minimal Assistance - Patient > 75% Assistive device: Walker-rolling    Walk 150 feet activity   Assist Walk 150 feet activity did not occur: Safety/medical concerns         Walk 10 feet on uneven surface  activity   Assist Walk 10 feet on uneven surfaces activity did not occur: Safety/medical concerns         Wheelchair     Assist Will patient use wheelchair at discharge?: Yes Type of Wheelchair: Manual    Wheelchair assist level: Supervision/Verbal cueing Max wheelchair distance: 100'    Wheelchair 50 feet with 2 turns activity    Assist        Assist Level: Supervision/Verbal cueing   Wheelchair 150 feet activity     Assist      Assist Level: Moderate Assistance - Patient 50 - 74%   Blood pressure 99/63, pulse (!) 119, temperature 98.2 F (36.8 C), resp. rate 18, height 5\' 9"  (1.753 m), weight 68.9 kg, SpO2 97 %.  Medical Problem List and Plan: 1.  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  1/7- spoke to brother yesterday who said he'd pass on to daughter- sounds like he didn't- palliative care to see pt  Continue CIR  2.  Antithrombotics: -DVT/anticoagulation:   Heparin.  Mechanical: Sequential compression devices, below knee Bilateral lower extremities   Dopplers negative for DVT  -antiplatelet therapy: N/A 3. Pain Management:    Decreased oxycodone to q4H prn.  DC gabapentin since causing constipation  Added Cymbalta 30 mg daily.    1/3- will increase Duloxetine to 60 mg nightly- for nerve pain  1/5- not taking pain meds often- encouraged pt to take pain meds before therapy.   1/6- will reduce oxy to Norco q4 hours prn due to increased confusion per family (nursing hasn't seen it)  with pain meds  1/9 pt told me his pain is under control presently 4. Mood: LCSW to follow for evaluation and support.              -antipsychotic agents: N/a 5. Neuropsych: This patient is capable of making decisions on his own behalf.  Appreciate neuropsych 6. Skin/Wound Care: Routine pressure relief measures.  7. Fluids/Electrolytes/Nutrition: Monitor I/Os.             Added juven to promote healing.  8. Neurogenic bladder: Foley in place due to urinary retention/UTI, with plans to remove on Monday  Flomax daily initiated on 12/30, monitor blood pressure closely  Florinef 0.1 mg daily initiated to assist with low BP.   Improving on 1/2  1/3- will d/c foley this afternoon, after radiation  1/4- wasn't removed- will remove today- this AM and ordered I/o caths and PVRs since might not void.  1/5- so far, no voiding- cathing required 100%   1/6-9- no voiding- caths volumes 300-600c q6 hours 9.  Acute renal failure: Resolved  Creatinine 0.86 on  12/27  10. Acute blood loss anemia:   Hemoglobin 12.4 on 12/28  Continue to monitor 11. Metastatic Lung AdenoCA:  On dulera--has been on supplemental oxygen  1/4- didn't get radiation yesterday- is scheduled ot go back today.  1/6- Getting radiation daily M-F  12. Kleb/Proteus UTI: Completed course of Cipro 13. 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  1/5- refused bowel program last night   And as a result had accident this AM.   1/6- hard clumps of stool- will add Miralax daily.   1/7- hard clumps- just added miralax so will wait to increase til this weekend.   1/9 pt had small pebbles with bp last night. Will increase miralax to bid   -add metamucil  14. Hypotension-   Severe orthostasis, negative on 12/31  Relatively controlled on 1/2  TEDs/abdominal binder for support.   1/5- added midodrine 5 mg TID-AC for low Bps  1/7- increased Midodrine to 10 mg TID- will also increase Florinef to help with orthostatic hypotension. It's really bad when goes to radiation  1/9 bp more robust with recent changes  15.  Sleep disturbance: Scheduled melatonin at nights.   Trazodone 100  1/3- sleeping well- con't regimen 17. Tenting/mild dehydration- will push 6 cups/water daily minimum  1/6- skin very dry- order eucerin AND push PO fluids 18. Hyperkalemia  Lokelma ordered on 12/23  Potassium 4.6 on 12/27 19. Pancoast tumor/ metastatic lung cancer:   Radiation to begin on 1/3 and to see Dr Julien Nordmann 1/3 at 2:15 pm  1/4- didn't get radiation- will make sure scheduled to  go back today  1/5- made sure pt went to radiation -his BP was low- but gave him 500cc IVFs NS bolus to help with BP- BP was 82/50s.   1/7- Increased Midodrine and florienf to help with low BP.  20. Dysphagia per pt  12/30- ordered SLP eval and treat  1/3- placed on D3 thin diet  1/4- is better when sitting up at 90 degrees  1/6- will add Protonix for reflux Sx's.   1/8 MBS performed 1/7--mod oral/mild pharyng dysphagia   -D1, thins recommended by SLP, meds with puree 21. Hypercalcemia  1/4- is down to 11 from 11.2- will monitor- if goes back up more, will treat.        LOS: 19 days A FACE TO FACE EVALUATION WAS PERFORMED  Meredith Staggers 01/31/2020, 8:08 AM

## 2020-01-31 NOTE — Progress Notes (Signed)
Physical Therapy Session Note  Patient Details  Name: Gregory Bautista MRN: [id number] Date of Birth: 1940-12-31  Today's Date: 01/31/2020 PT Individual Time: 1400-1430 PT Individual Time Calculation (min): 30 min   Short Term Goals: Week 3:  PT Short Term Goal 1 (Week 3): =LTG due to ELOS  Skilled Therapeutic Interventions/Progress Updates:    Pt received seated in bed, agreeable to PT session. No complaints of pain at rest, does report L axillary pain described as "burning" during session that improves again at rest. Semi-reclined BP 105/85 with knee-high TEDs. Semi-reclined to sitting EOB with Supervision with use of bedrail and HOB elevated. Sit to stand with min A to RW. Once in standing pt reports onset of dizziness. Attempted to obtain standing BP but pt unable to tolerate standing long enough due to dizziness and fatigue, pt returned to sitting. Assisted pt with donning thigh-high TEDs, no abdominal binder present yet in patient's room. Pt again stands to RW with min A, standing BP 74/52, returned to sitting. Seated BP 114/73 and pt asymptomatic in sitting. Due to time constraints unable to further problem solve BP management in standing and awaiting delivery of abdominal binder. Pt returned to sitting in bed at Supervision level with use of bedrail. Pt on 2L O2 via [place] throughout session, SpO2 at 100% during session. Pt's brother also present during therapy session with questions regarding pt's current level of function and d/c plan. Updated brother on pt's current level of function (min A overall, short distance gait with RW, would not be safe to d/c home alone at this time, increase in fatigue following radiation treatment, etc.). Pt's brother plans to attend PT session tomorrow in order to further observe pt's current level of function and assist needed. Pt's brother anxious about upcoming d/c date due to lack of family support for pt upon d/c from hospital and pt's current needs, discussed  that this is a concern of pt's care team here as well. Pt left semi-reclined in bed with needs in reach, bed alarm in place, brother present at end of session.  Therapy Documentation Precautions:  Precautions Precautions: Back,Fall Precaution Comments: reviewed precautions Restrictions Weight Bearing Restrictions: No    Therapy/Group: Individual Therapy   Excell Seltzer, PT, DPT  01/31/2020, 5:06 PM

## 2020-01-31 NOTE — Plan of Care (Signed)
  Problem: Consults Goal: RH SPINAL CORD INJURY PATIENT EDUCATION Description:  See Patient Education module for education specifics.  Outcome: Progressing   Problem: SCI BOWEL ELIMINATION Goal: RH STG MANAGE BOWEL WITH ASSISTANCE Description: STG Manage Bowel with  Max Assistance. Outcome: Progressing Goal: RH STG SCI MANAGE BOWEL WITH MEDICATION WITH ASSISTANCE Description: STG SCI Manage bowel with medication with Max assistance. Outcome: Progressing Goal: RH STG SCI MANAGE BOWEL PROGRAM W/ASSIST OR AS APPROPRIATE Description: STG SCI Manage bowel program w/ Min assist or as appropriate. Outcome: Progressing   Problem: SCI BLADDER ELIMINATION Goal: RH STG MANAGE BLADDER WITH ASSISTANCE Description: STG Manage Bladder With Max Assistance Outcome: Progressing   Problem: RH SKIN INTEGRITY Goal: RH STG ABLE TO PERFORM INCISION/WOUND CARE W/ASSISTANCE Description: STG Able To Perform Incision/Wound Care With [employer]. Outcome: Progressing   Problem: RH PAIN MANAGEMENT Goal: RH STG PAIN MANAGED AT OR BELOW PT'S PAIN GOAL Description: < 4 out of 10.  Outcome: Progressing   Problem: RH KNOWLEDGE DEFICIT SCI Goal: RH STG INCREASE KNOWLEDGE OF SELF CARE AFTER SCI Description: Pt will demonstrate increased knowledge of how to care for self after spinal cord surgery including precautions, medication, bowel and bladder management, and prevention of complications with min assist at discharge.  Outcome: Progressing

## 2020-01-31 NOTE — Progress Notes (Signed)
Speech Language Pathology Daily Session Note  Patient Details  Name: Gregory Bautista MRN: [id number] Date of Birth: 23-Sep-1940  Today's Date: 01/31/2020 SLP Individual Time: 0900-0940 SLP Individual Time Calculation (min): 40 min  Short Term Goals: Week 1: SLP Short Term Goal 1 (Week 1): Pt will consume dys 3 textures and thin liquids with mod I use of swallowing precautions SLP Short Term Goal 2 (Week 1): Pt will consume therapeutic trials of regular textures to continue working towards diet progression with mod I use of swallowing precautions and no overt s/s of aspiration  Skilled Therapeutic Interventions: Pt was seen for skilled ST targeting dysphagia goals. SLP facilitated session with skilled education reviewing results of MBS (from Fri 1/7) with pt. Pt's RN also present for portions of session and ST provided Min A verbal cues for use of compensatory strategies such as sips of drink to facilitated swallow initiation when consuming his medications crushed in applesauce. SLP also provided saltine cracker, which pt required between  3-4 minutes to masticate and prepare bolus for deglutition. Overall limited lingual manipulation noted, as well as piecemeal swallowing (about 3 swallows required to clear 1 bite from oral cavity). Oral transit of puree textures was still laborious for pt but more timely and functional. No overt s/sx aspiration noted with puree, soft solids, or thin liquids. Recommend continue current diet. Pt left laying in bed with alarm set and needs within reach. Continue per current plan of care.      Pain Pain Assessment Pain Scale: 0-10 Pain Score: 0-No pain  Therapy/Group: Individual Therapy  Arbutus Leas 01/31/2020, 7:27 AM

## 2020-01-31 NOTE — Progress Notes (Signed)
Dig stemmed patient with results. Small, soft, brown bowel movement.

## 2020-02-01 ENCOUNTER — Inpatient Hospital Stay (HOSPITAL_COMMUNITY): Payer: Medicare Other

## 2020-02-01 ENCOUNTER — Ambulatory Visit
Admission: RE | Admit: 2020-02-01 | Discharge: 2020-02-01 | Disposition: A | Discharge status: Home / Self Care | Payer: Medicare Other | Source: Ambulatory Visit | Attending: Radiation Oncology | Admitting: Radiation Oncology

## 2020-02-01 ENCOUNTER — Inpatient Hospital Stay (HOSPITAL_COMMUNITY): Payer: Medicare Other | Admitting: Physical Therapy

## 2020-02-01 DIAGNOSIS — R918 Other nonspecific abnormal finding of lung field: Secondary | ICD-10-CM | POA: Diagnosis not present

## 2020-02-01 LAB — BASIC METABOLIC PANEL
Anion gap: 11 (ref 5–15)
BUN: 25 mg/dL — ABNORMAL HIGH (ref 8–23)
CO2: 28 mmol/L (ref 22–32)
Calcium: 10.9 mg/dL — ABNORMAL HIGH (ref 8.9–10.3)
Chloride: 98 mmol/L (ref 98–111)
Creatinine, Ser: 0.81 mg/dL (ref 0.61–1.24)
GFR, Estimated: >60 mL/min (ref >60)
Glucose, Bld: 139 mg/dL — ABNORMAL HIGH (ref 70–99)
Potassium: 4.5 mmol/L (ref 3.5–5.1)
Sodium: 137 mmol/L (ref 135–145)

## 2020-02-01 LAB — CBC WITH DIFFERENTIAL/PLATELET
Abs Immature Granulocytes: 0.05 10*3/uL (ref 0.00–0.07)
Basophils Absolute: 0 10*3/uL (ref 0.0–0.1)
Basophils Relative: 0 %
Eosinophils Absolute: 0.1 10*3/uL (ref 0.0–0.5)
Eosinophils Relative: 1 %
HCT: 33.6 % — ABNORMAL LOW (ref 39.0–52.0)
Hemoglobin: 10.3 g/dL — ABNORMAL LOW (ref 13.0–17.0)
Immature Granulocytes: 1 %
Lymphocytes Relative: 18 %
Lymphs Abs: 1.1 10*3/uL (ref 0.7–4.0)
MCH: 28.1 pg (ref 26.0–34.0)
MCHC: 30.7 g/dL (ref 30.0–36.0)
MCV: 91.6 fL (ref 80.0–100.0)
Monocytes Absolute: 0.7 10*3/uL (ref 0.1–1.0)
Monocytes Relative: 11 %
Neutro Abs: 4.1 10*3/uL (ref 1.7–7.7)
Neutrophils Relative %: 69 %
Platelets: 237 10*3/uL (ref 150–400)
RBC: 3.67 MIL/uL — ABNORMAL LOW (ref 4.22–5.81)
RDW: 12.8 % (ref 11.5–15.5)
WBC: 5.9 10*3/uL (ref 4.0–10.5)
nRBC: 0.3 % — ABNORMAL HIGH (ref 0.0–0.2)

## 2020-02-01 MED ORDER — SORBITOL 70 % SOLN
30.0000 mL | Freq: Once | Status: AC
  Administered 2020-02-01: 30 mL via ORAL
  Filled 2020-02-01: qty 30

## 2020-02-01 MED ORDER — DRONABINOL 2.5 MG PO CAPS
2.5000 mg | ORAL_CAPSULE | Freq: Two times a day (BID) | ORAL | Status: DC
  Administered 2020-02-01 – 2020-02-02 (×4): 2.5 mg via ORAL
  Filled 2020-02-01 (×4): qty 1

## 2020-02-01 MED ORDER — LIDOCAINE 5 % EX PTCH: 2.0000 | MEDICATED_PATCH | CUTANEOUS | Status: DC

## 2020-02-01 NOTE — Progress Notes (Signed)
This chaplain responded to PMT consult for spiritual care and notarizing the Pt. AD.  The Pt. is sitting up and participating with other members of the healthcare team.  The chaplain is at the Pt. bedside with the update, a notary for the Pt. AD is not available today.    The Pt. RN-Karla informs the chaplain the Pt. will have radiation every afternoon this week.  The chaplain updated the Pt. the chaplain will F/U on Tuesday morning.   The chaplain understands the Pt. prefers communication with his family after Tuesday's attempt to notarize the Pt. AD.

## 2020-02-01 NOTE — Progress Notes (Signed)
Speech Language Pathology Daily Session Note  Patient Details  Name: Gregory Bautista MRN: [id number] Date of Birth: 09/26/1940  Today's Date: 02/01/2020 SLP Individual Time: 1202-1231 SLP Individual Time Calculation (min): 29 min  Short Term Goals: Week 1: SLP Short Term Goal 1 (Week 1): Pt will consume dys 3 textures and thin liquids with mod I use of swallowing precautions SLP Short Term Goal 2 (Week 1): Pt will consume therapeutic trials of regular textures to continue working towards diet progression with mod I use of swallowing precautions and no overt s/s of aspiration SLP Short Term Goal 2 - Progress (Week 1): Discontinued (comment)  Skilled Therapeutic Interventions:Skilled ST services focused on swallow skills. SLP facilitated PO consumption of thin liquids, dys 1 and dys 2 texture snack. Pt consumed dys 1 textures with appropriate oral clearance and timely swallow. Pt required extensive anterior mastication (due to missing posterior dentition) to consume dys 2 textures up to two minutes. Pt confirmed that dentures are not present in hospital and he has increase fatigue consuming non-pureed textures's without. SLP recommend to continue current diet, until pt can be assessed with dentures. Pt was left in room with call bell within reach and bed alarm set. SLP recommends to continue skilled services.     Pain Pain Assessment Pain Score: 0-No pain  Therapy/Group: Individual Therapy  [name]  [facility] 02/01/2020, 12:59 PM

## 2020-02-01 NOTE — Progress Notes (Signed)
Physical Therapy Session Note  Patient Details  Name: Jadie Comas MRN: [id number] Date of Birth: 02-Apr-1940  Today's Date: 02/01/2020 PT Individual Time: 0915-1000 PT Individual Time Calculation (min): 45 min   Short Term Goals: Week 3:  PT Short Term Goal 1 (Week 3): =LTG due to ELOS  Skilled Therapeutic Interventions/Progress Updates:    Pt received seated in w/c in room, agreeable to PT session. No complaints of pain. Seated BP 93/65 with thigh-high TEDs in place. Sit to stand with min A to RW. Stand pivot transfer to bed with RW and min A. Sit to supine with Supervision with use of bedrail. Assisted pt with donning BLE ACE wrap to assist with BP management. Nursing notified of low BP and to order another abdominal binder as pt's cannot be found. Pt returned to sitting with Supervision. Seated BP 101/74 with use of thigh-high TEDs and ACE wrap. Sit to stand with min A to RW. Standing BP 95/57 with onset of dizziness with extended time spent standing. Sit to stand x 5 reps from elevated bed to RW with min A. Pt requests to lay back down at end of session. Sit to supine Supervision. Pt left semi-reclined in bed with needs in reach, bed alarm in place. Pt on 2L O2 via [place] throughout session, SpO2 remains at 91% and higher with activity.  Therapy Documentation Precautions:  Precautions Precautions: Back,Fall Precaution Comments: reviewed precautions Restrictions Weight Bearing Restrictions: No    Therapy/Group: Individual Therapy   Excell Seltzer, PT, DPT  02/01/2020, 12:17 PM

## 2020-02-01 NOTE — Progress Notes (Signed)
Patient had no c/o pain during the shift.  Patient did leave the hospital to go to Radiation Treatment.  Patient refused Lidocaine patches and stated, "I don't want those."  Patient sitting up in bed watching TV in no distress at the end of shift.

## 2020-02-01 NOTE — Progress Notes (Signed)
Patient ID: Gregory Bautista, male   DOB: 20-Nov-1940, 80 y.o.   MRN: [id number]  SW followed up with [facility] [phone]) to verify pt on schedule this week for 2pm pick-up for radiation treatment. Confirms pt is on schedule.   SW spoke with pt brother [name] [phone]) and dtr [name] [phone]) in conference call to discuss if there were any updates on what assistance they are able to provide to patient. Reports due to health, age, and COVID no family is able to assist with his care needs. SW informed there will be follow-up after his team conference tomorrow.   [name], MSW, [place] Office: [phone] Cell: [phone] Fax: [phone]

## 2020-02-01 NOTE — Progress Notes (Signed)
Occupational Therapy Session Note  Patient Details  Name: Gregory Bautista MRN: [id number] Date of Birth: 05/21/1940  Today's Date: 02/01/2020 OT Individual Time: 0700-0755 OT Individual Time Calculation (min): 55 min    Short Term Goals: Week 3:  OT Short Term Goal 1 (Week 3): STG=LTG secondary to ELOS  Skilled Therapeutic Interventions/Progress Updates:    Pt resting in bed upon arrival. Breakfast on bedside table beside bed. Pt agreeable to getting OOB to eat breakfast. Ted hose donned at bed level. Supine>sit EOB with supervision. No s/s of orthostasis. Sit>stand from EOB with CGA to pull pants over hips. Pt performed stand step tranfser with CGA using RW. No report of dizziness. Pt performed sit<>stand X 5 from w/c with CGA. Pt required mod encouragement to eat breakfast. Pt remained in w/c with belt alarm activated and all needs within reach. Pt with flat affect throughout session with limited interaction initiated.   Therapy Documentation Precautions:  Precautions Precautions: Back,Fall Precaution Comments: reviewed precautions Restrictions Weight Bearing Restrictions: No Pain: Pt c/o 6/10 generalized pain; emotional support and repositioned Therapy/Group: Individual Therapy  Leroy Libman 02/01/2020, 8:00 AM

## 2020-02-01 NOTE — Progress Notes (Signed)
[place] PHYSICAL MEDICINE & REHABILITATION PROGRESS NOTE   Subjective/Complaints:  Pt reports not much results with bowel program- some is hard pebbles- also feels constipated.   Still no voiding- wants to keep doing I/o caths- not replace foley.   Also, pain is mainly in L upper trap which is very tight.   D1 thins liquid diet- no improvement in swallowing per pt. Also admits poor appetite- will start Marinol low dose for appetite.      ROS:  Pt denies SOB, abd pain, CP, N/V/C/D, and vision changes .    Objective:   No results found. Recent Labs    01/29/20 1059 02/01/20 0539  WBC 5.9 5.9  HGB 10.4* 10.3*  HCT 33.7* 33.6*  PLT 270 237   Recent Labs    01/29/20 1059 02/01/20 0539  NA 135 137  K 4.1 4.5  CL 98 98  CO2 28 28  GLUCOSE 109* 139*  BUN 21 25*  CREATININE 0.94 0.81  CALCIUM 11.4* 10.9*    Intake/Output Summary (Last 24 hours) at 02/01/2020 0849 Last data filed at 02/01/2020 0423 Gross per 24 hour  Intake 600 ml  Output 705 ml  Net -105 ml    Physical Exam: Vital Signs Blood pressure 95/62, pulse (!) 117, temperature 98.3 F (36.8 C), temperature source Oral, resp. rate 18, height 5\' 9"  (1.753 m), weight 68.9 kg, SpO2 100 %. Constitutional: No distress . Vital signs reviewed. Sitting up in bedside chair, appropriate, flat affect, NAD HEENT: EOMI, oral membranes moist- O2 via [place] 2L Neck: supple Cardiovascular: tachycardic- regular rhythm   Respiratory/Chest: CTA B/L- no W/R/R- good air movement GI/Abdomen: Soft, NT, ND, (+)BS - hypoactive Ext: no clubbing, cyanosis, or edema- very tight in L>R upper traps- also bones more prominent losing weight Psych: pleasant and cooperative Skin: Warm and dry.  Intact. Musc: No edema in extremities.  No tenderness in extremities. Neuro: Alert but decreased memory. Very with it this AM- telling me all the issues.  Motor: Bilateral upper extremities: Grossly 5/5 proximal distal Bilateral lower  extremities: Grossly 4+/5 proximal to distal, stable  Assessment/Plan: 1. Functional deficits which require 3+ hours per day of interdisciplinary therapy in a comprehensive inpatient rehab setting.  Physiatrist is providing close team supervision and 24 hour management of active medical problems listed below.  Physiatrist and rehab team continue to assess barriers to discharge/monitor patient progress toward functional and medical goals  Care Tool:  Bathing    Body parts bathed by patient: Right arm,Left arm,Chest,Abdomen,Right upper leg,Left upper leg,Face,Front perineal area   Body parts bathed by helper: Buttocks,Right lower leg,Left lower leg Body parts n/a: Front perineal area,Buttocks,Right lower leg,Left lower leg   Bathing assist Assist Level: Minimal Assistance - Patient > 75%     Upper Body Dressing/Undressing Upper body dressing   What is the patient wearing?: Pull over shirt    Upper body assist Assist Level: Set up assist    Lower Body Dressing/Undressing Lower body dressing      What is the patient wearing?: Incontinence brief     Lower body assist Assist for lower body dressing: Total Assistance - Patient < 25%     Toileting Toileting    Toileting assist Assist for toileting: Moderate Assistance - Patient 50 - 74% Assistive Device Comment: BSC   Transfers Chair/bed transfer  Transfers assist     Chair/bed transfer assist level: Minimal Assistance - Patient > 75% Chair/bed transfer assistive device: Other (none)   Locomotion Ambulation  Ambulation assist      Assist level: Minimal Assistance - Patient > 75% Assistive device: Walker-rolling Max distance: 60'   Walk 10 feet activity   Assist     Assist level: Minimal Assistance - Patient > 75% Assistive device: Walker-rolling   Walk 50 feet activity   Assist Walk 50 feet with 2 turns activity did not occur: Safety/medical concerns  Assist level: Minimal Assistance - Patient >  75% Assistive device: Walker-rolling    Walk 150 feet activity   Assist Walk 150 feet activity did not occur: Safety/medical concerns         Walk 10 feet on uneven surface  activity   Assist Walk 10 feet on uneven surfaces activity did not occur: Safety/medical concerns         Wheelchair     Assist Will patient use wheelchair at discharge?: Yes Type of Wheelchair: Manual    Wheelchair assist level: Supervision/Verbal cueing Max wheelchair distance: 100'    Wheelchair 50 feet with 2 turns activity    Assist        Assist Level: Supervision/Verbal cueing   Wheelchair 150 feet activity     Assist      Assist Level: Moderate Assistance - Patient 50 - 74%   Blood pressure 95/62, pulse (!) 117, temperature 98.3 F (36.8 C), temperature source Oral, resp. rate 18, height 5\' 9"  (1.753 m), weight 68.9 kg, SpO2 100 %.  Medical Problem List and Plan: 1.  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  1/7- spoke to brother yesterday who said he'd pass on to daughter- sounds like he didn't- palliative care to see pt  1/10- palliative saw pt- is DNAR/DNI now  Continue CIR  2.  Antithrombotics: -DVT/anticoagulation:   Heparin.  Mechanical: Sequential compression devices, below knee Bilateral lower extremities   Dopplers negative for DVT  -antiplatelet therapy: N/A 3. Pain Management:    Decreased oxycodone to q4H prn.  DC gabapentin since causing constipation  Added Cymbalta 30 mg daily.   1/3- will increase Duloxetine to 60 mg nightly- for nerve pain  1/5- not taking pain meds often- encouraged pt to take pain meds before therapy.   1/6- will reduce oxy to  Norco q4 hours prn due to increased confusion per family (nursing hasn't seen it)  with pain meds  1/9 pt told me his pain is under control presently  1/10- will place lidoderm patches on shoulders/upper traps B/L where muscles REAL tight.  4. Mood: LCSW to follow for evaluation and support.              -antipsychotic agents: N/a 5. Neuropsych: This patient is capable of making decisions on his own behalf.  Appreciate neuropsych 6. Skin/Wound Care: Routine pressure relief measures.  7. Fluids/Electrolytes/Nutrition: Monitor I/Os.             Added juven to promote healing.  8. Neurogenic bladder: Foley in place due to urinary retention/UTI, with plans to remove on Monday  Flomax daily initiated on 12/30, monitor blood pressure closely  Florinef 0.1 mg daily initiated to assist with low BP.   Improving on 1/2  1/3- will d/c foley this afternoon, after radiation  1/4-  wasn't removed- will remove today- this AM and ordered I/o caths and PVRs since might not void.  1/5- so far, no voiding- cathing required 100%   1/6-9- no voiding- caths volumes 300-600c q6 hours  1/10- no voiding- in/out caths being done- pt doesn't want foley back.  9.  Acute renal failure: Resolved  Creatinine 0.86 on 12/27  10. Acute blood loss anemia:   Hemoglobin 12.4 on 12/28  Continue to monitor 11. Metastatic Lung AdenoCA:  On dulera--has been on supplemental oxygen  1/4- didn't get radiation yesterday- is scheduled ot go back today.  1/6- Getting radiation daily M-F  12. Kleb/Proteus UTI: Completed course of Cipro 13. 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  1/5- refused bowel program last night  And as a  result had accident this AM.   1/6- hard clumps of stool- will add Miralax daily.   1/7- hard clumps- just added miralax so will wait to increase til this weekend.   1/9 pt had small pebbles with bp last night. Will increase miralax to bid   -add metamucil   1/10- added Sorbitol today x1- and follow with suppository 14. Hypotension-   Severe orthostasis, negative on 12/31  Relatively controlled on 1/2  TEDs/abdominal binder for support.   1/5- added midodrine 5 mg TID-AC for low Bps  1/7- increased Midodrine to 10 mg TID- will also increase Florinef to help with orthostatic hypotension. It's really bad when goes to radiation  1/9 bp more robust with recent changes  15.  Sleep disturbance: Scheduled melatonin at nights.   Trazodone 100  1/3- sleeping well- con't regimen 17. Tenting/mild dehydration- will push 6 cups/water daily minimum  1/6- skin very dry- order eucerin AND push PO fluids 18. Hyperkalemia  Lokelma ordered on 12/23  Potassium 4.6 on 12/27 19. Pancoast tumor/ metastatic lung cancer:   Radiation to begin on 1/3 and to see Dr Julien Nordmann 1/3 at 2:15 pm  1/4- didn't get radiation- will make sure scheduled to  go back today  1/5- made sure pt went to radiation -his BP was low- but gave him 500cc IVFs NS bolus to help with BP- BP was 82/50s.   1/7- Increased Midodrine and florienf to help with low BP.  20. Dysphagia per pt  12/30- ordered SLP eval and treat  1/3- placed on D3 thin diet  1/4- is better when sitting up at 90 degrees  1/6- will add Protonix for reflux Sx's.   1/8 MBS performed 1/7--mod oral/mild pharyng dysphagia   -D1, thins recommended by SLP, meds with puree  1/10-  Pt says doesn't help much with D1 thins.  21. Hypercalcemia  1/4- is down to 11 from 11.2- will monitor- if goes back up more, will treat. 22. Poor appetite  1/10- will try Marinol 2.5 mg BID        LOS: 20 days A FACE TO FACE EVALUATION WAS PERFORMED  Eryanna Regal 02/01/2020, 8:49 AM

## 2020-02-01 NOTE — Progress Notes (Signed)
Patient on bed pan when nurse walked in to see patient. Had already finished. Dig stim completed after patients large bowel movement, multiple large clumps of feces. Only a tiny blob of feces was left in rectum. Manually removed. Patient laying in bed watching TV with call bell within reach.

## 2020-02-02 ENCOUNTER — Inpatient Hospital Stay (HOSPITAL_COMMUNITY): Payer: Medicare Other

## 2020-02-02 ENCOUNTER — Inpatient Hospital Stay (HOSPITAL_COMMUNITY): Payer: Medicare Other | Admitting: Physical Therapy

## 2020-02-02 ENCOUNTER — Ambulatory Visit
Admission: RE | Admit: 2020-02-02 | Discharge: 2020-02-02 | Disposition: A | Discharge status: Home / Self Care | Payer: Medicare Other | Source: Ambulatory Visit | Attending: Radiation Oncology | Admitting: Radiation Oncology

## 2020-02-02 DIAGNOSIS — R918 Other nonspecific abnormal finding of lung field: Secondary | ICD-10-CM | POA: Diagnosis not present

## 2020-02-02 MED ORDER — CITALOPRAM HYDROBROMIDE 10 MG PO TABS
10.0000 mg | ORAL_TABLET | Freq: Every day | ORAL | Status: DC
  Administered 2020-02-02 – 2020-02-06 (×4): 10 mg via ORAL
  Filled 2020-02-02 (×5): qty 1

## 2020-02-02 NOTE — Progress Notes (Signed)
This chaplain is present for F/U spiritual care with the Pt. and notarizing of the Pt. Advance Directive: HCPOA only.    The Pt. chose Antonise Dantzler as his healthcare agent and [name] Glick as his next choice,  if the healthcare agent is unable or unwilling to serve.  The Pt. was given the original document and two copies.  A copy was scanned to the Pt. EMR.  The chaplain listened as the Pt. shared through story telling, the importance of nurturing family relationships even though they may be miles apart. The chaplain understands the Pt. is leaning on the familial relationships for companionship and strength on his healthcare journey.   The Pt. accepted the chaplain's invitation for F/U spiritual care.

## 2020-02-02 NOTE — NC FL2 (Addendum)
[place] LEVEL OF CARE SCREENING TOOL     IDENTIFICATION  Patient Name: Gregory Bautista: 1940-06-26 Sex: male Admission Date (Current Location): 01/12/2020  [facility] and [place] Number:      Facility and Address:  [facility], [place] [address], [place], [place] [address]      Provider Number: [id number]  Attending Physician Name and Address:  Courtney Heys, MD  Relative Name and Phone Number:  Laurian Brim (dtr) [phone]    Current Level of Care: Hospital Recommended Level of Care: [place] Prior Approval Number:    Date Approved/Denied:   PASRR Number:    Discharge Plan: SNF    Current Diagnoses: Patient Active Problem List   Diagnosis Date Noted  . Drug-induced constipation   . Neurogenic bowel   . Hyperkalemia   . Neurogenic bladder   . Sleep disturbance   . Postoperative pain   . 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 Mass of upper lobe of left lung 12/24/2019  . Tobacco use disorder 09/24/2019    Orientation RESPIRATION BLADDER Height & Weight     Self,Time,Situation,Place  O2 (2L [place]) Incontinent (cathing) Weight: 140 lb 14 oz (63.9 kg) Height:  5\' 9"  (175.3 cm)  BEHAVIORAL SYMPTOMS/MOOD NEUROLOGICAL BOWEL NUTRITION STATUS      Continent Diet (Pureed)  AMBULATORY STATUS COMMUNICATION OF NEEDS Skin   Limited Assist Verbally Normal                       Personal Care Assistance Level of Assistance  Bathing,Feeding,Dressing Bathing Assistance: Limited assistance Feeding assistance: Limited assistance Dressing Assistance: Limited assistance     Functional Limitations Info             SPECIAL CARE FACTORS FREQUENCY  PT (By licensed PT),OT (By licensed OT),Speech therapy     PT Frequency: 5xs per week OT Frequency: 5xs per week     Speech Therapy Frequency: 5xs per week      Contractures  Contractures Info: Not present    Additional Factors Info  Code Status Code Status Info: DNR Allergies Info: Penicillins High Anaphylaxis, Other (See Comments) Pt does not recall this reaction 2021*Tolerated Cephalexin* Has patient had a PCN reaction causing immediate rash, facial/tongue/throat swelling, SOB or lightheadedness with hypotension: Yes Has patient had a PCN reaction causing severe rash involving mucus membranes or skin necrosis: No Has patient had a PCN reaction that required hospitalization No Has patient had a PCN reaction occurring within the last 10 years: Yes If all of the above answers are "NO", then may proceed with Cephalosporin use  Wellbutrin (bupropion) High Other (See Comments) Patient disputes this in 2021 Seizures  Morphine And Related Medium Itching, Other (See Comments) Patient disputes this in 2021           Current Medications (02/02/2020):  This is the current hospital active medication list Current Facility-Administered Medications  Medication Dose Route Frequency Provider Last Rate Last Admin  . acetaminophen (TYLENOL) tablet 325-650 mg  325-650 mg Oral Q4H PRN Bary Leriche, PA-C   650 mg at 01/29/20 2209  . albuterol (VENTOLIN HFA) 108 (90 Base) MCG/ACT inhaler 2 puff  2 puff Inhalation Q6H PRN Love, Pamela S, PA-C      . alum & mag hydroxide-simeth (MAALOX/MYLANTA) 200-200-20 MG/5ML suspension 30 mL  30 mL Oral Q4H PRN Love, Ivan Anchors, PA-C      .  bisacodyl (DULCOLAX) suppository 10 mg  10 mg Rectal Daily PRN Love, Pamela S, PA-C      . bisacodyl (DULCOLAX) suppository 10 mg  10 mg Rectal QPC supper Bary Leriche, PA-C   10 mg at 02/01/20 1823  . Chlorhexidine Gluconate Cloth 2 % PADS 6 each  6 each Topical Daily Lovorn, Jinny Blossom, MD   6 each at 01/26/20 0848  . citalopram (CELEXA) tablet 10 mg  10 mg Oral Daily Lovorn, Megan, MD      . cyclobenzaprine (FLEXERIL) tablet 10 mg  10 mg Oral TID PRN Bary Leriche, PA-C   10 mg at 01/25/20 1252  . dextrose 5 % and  0.45 % NaCl with KCl 10 mEq/L infusion   Intravenous Continuous Courtney Heys, MD   Stopped at 01/20/20 0322  . diphenhydrAMINE (BENADRYL) 12.5 MG/5ML elixir 12.5-25 mg  12.5-25 mg Oral Q6H PRN Love, Pamela S, PA-C      . docusate sodium (COLACE) capsule 100 mg  100 mg Oral TID Izora Ribas, MD   100 mg at 02/02/20 0829  . dronabinol (MARINOL) capsule 2.5 mg  2.5 mg Oral BID AC Lovorn, Megan, MD   2.5 mg at 02/01/20 1635  . DULoxetine (CYMBALTA) DR capsule 60 mg  60 mg Oral QHS Lovorn, Megan, MD   60 mg at 02/01/20 2146  . feeding supplement (ENSURE ENLIVE / ENSURE PLUS) liquid 237 mL  237 mL Oral TID BM Love, Pamela S, PA-C   237 mL at 02/01/20 2000  . fludrocortisone (FLORINEF) tablet 0.2 mg  0.2 mg Oral Daily Lovorn, Megan, MD   0.2 mg at 02/02/20 0829  . guaiFENesin-dextromethorphan (ROBITUSSIN DM) 100-10 MG/5ML syrup 5-10 mL  5-10 mL Oral Q6H PRN Love, Pamela S, PA-C      . heparin injection 5,000 Units  5,000 Units Subcutaneous Q8H Bary Leriche, PA-C   5,000 Units at 02/02/20 0543  . hydrocerin (EUCERIN) cream   Topical BID Courtney Heys, MD   Given at 02/02/20 [phone]  . HYDROcodone-acetaminophen (NORCO/VICODIN) 5-325 MG per tablet 1 tablet  1 tablet Oral Q4H PRN Courtney Heys, MD   1 tablet at 01/31/20 0505  . ipratropium (ATROVENT) 0.06 % nasal spray 2 spray  2 spray Each Nare TID Bary Leriche, PA-C   2 spray at 02/01/20 0847  . latanoprost (XALATAN) 0.005 % ophthalmic solution 1 drop  1 drop Both Eyes QHS Bary Leriche, PA-C   1 drop at 02/01/20 2151  . lidocaine (LIDODERM) 5 % 3 patch  3 patch Transdermal Daily Lovorn, Megan, MD   3 patch at 01/31/20 0908  . lidocaine (XYLOCAINE) 2 % jelly 1 application  1 application Other Daily Lovorn, Megan, MD   1 application at 32/35/57 1823  . melatonin tablet 3 mg  3 mg Oral QHS Love, Pamela S, PA-C   3 mg at 02/01/20 2147  . menthol-cetylpyridinium (CEPACOL) lozenge 3 mg  1 lozenge Oral PRN Love, Pamela S, PA-C       Or  . phenol  (CHLORASEPTIC) mouth spray 1 spray  1 spray Mouth/Throat PRN Love, Pamela S, PA-C      . midodrine (PROAMATINE) tablet 10 mg  10 mg Oral TID WC Lovorn, Megan, MD   10 mg at 02/02/20 0828  . mometasone-formoterol (DULERA) 200-5 MCG/ACT inhaler 2 puff  2 puff Inhalation BID Bary Leriche, PA-C   2 puff at 02/01/20 2015  . multivitamin with minerals tablet 1 tablet  1 tablet  Oral Daily Bary Leriche, [place]   1 tablet at 02/02/20 4970  . nutrition supplement (JUVEN) (JUVEN) powder packet 1 packet  1 packet Oral BID BM Bary Leriche, PA-C   1 packet at 02/01/20 1000  . pantoprazole (PROTONIX) EC tablet 40 mg  40 mg Oral Daily Lovorn, Megan, MD   40 mg at 02/02/20 0829  . polyethylene glycol (MIRALAX / GLYCOLAX) packet 17 g  17 g Oral BID Meredith Staggers, MD   17 g at 02/02/20 0830  . prochlorperazine (COMPAZINE) tablet 5-10 mg  5-10 mg Oral Q6H PRN Love, Pamela S, PA-C       Or  . prochlorperazine (COMPAZINE) injection 5-10 mg  5-10 mg Intramuscular Q6H PRN Love, Pamela S, PA-C       Or  . prochlorperazine (COMPAZINE) suppository 12.5 mg  12.5 mg Rectal Q6H PRN Love, Pamela S, PA-C      . psyllium (HYDROCIL/METAMUCIL) 1 packet  1 packet Oral Daily Meredith Staggers, MD   1 packet at 02/02/20 0830  . senna-docusate (Senokot-S) tablet 2 tablet  2 tablet Oral Q breakfast Bary Leriche, [place]   2 tablet at 02/02/20 2637  . sodium phosphate (FLEET) 7-19 GM/118ML enema 1 enema  1 enema Rectal Once PRN Love, Pamela S, PA-C      . tamsulosin (FLOMAX) capsule 0.4 mg  0.4 mg Oral QPC supper Lovorn, Megan, MD   0.4 mg at 02/01/20 1823  . timolol (TIMOPTIC) 0.5 % ophthalmic solution 1 drop  1 drop Both Eyes BID Bary Leriche, PA-C   1 drop at 02/02/20 0837  . traMADol (ULTRAM) tablet 50 mg  50 mg Oral QID PRN Bary Leriche, PA-C   50 mg at 02/02/20 0557  . traZODone (DESYREL) tablet 100 mg  100 mg Oral QHS Lovorn, Megan, MD   100 mg at 02/01/20 2147     Discharge Medications: Please see discharge summary  for a list of discharge medications.  Relevant Imaging Results:  Relevant Lab Results:   Additional Information SS#: [id number]   Current radiation treatment ends on 02/11/20.  Rana Snare, LCSW

## 2020-02-02 NOTE — Progress Notes (Signed)
Responded to request to assist patient with AD.  Notarized AD for patient and supported chaplain colleague during process.  Will follow as needed.   Jaclynn Major, [place], [facility], Pager [phone]

## 2020-02-02 NOTE — Progress Notes (Addendum)
Patient ID: Gregory Bautista, male   DOB: Nov 30, 1940, 80 y.o.   MRN: [id number]  SW met with pt in room and called his dtr Gregory Bautista and brother Gregory Bautista to provide updates from team conference, d/c date 1/24 and will work on SNF placement at d/c.. SW provided pt with SNF list to review, and SW emailed SNF list to his daughter Gregory Bautista ([email or web address]). Pt brother Gregory Bautista to review SNF with pt tomorrow before he returns to [place]. States he and their brother Gregory Bautista were going to look at some nursing homes today. SW reiterated once a bed is located, it is all pending insurance approval.   SW later spoke with Gregory Bautista who reported they are interested in [facility] and will continue to look at other locations; SW informed they have extended a bed offer as well. SW to follow-up with SNF.    SW left message for [facility] Navigator([phone])   canceling pt PET scan scheduled for tomorrow due to change in pt d/c date and pt will d/c to SNF after his treatment. SW encouraged follow-up if needed.   [name], MSW, [place] Office: [phone] Cell: [phone] Fax: [phone]

## 2020-02-02 NOTE — Progress Notes (Signed)
Bowel program started at 1830. Able to remove small stool with dig Stim. Felt more stool in rectal vault. Unable to perform complete dig Stim or disimpaction due to increased pain despite using lidocaine gel. Pt will benefit from enema. Reported off to oncoming staff.   Gerald Stabs, RN

## 2020-02-02 NOTE — Progress Notes (Addendum)
[place] PHYSICAL MEDICINE & REHABILITATION PROGRESS NOTE   Subjective/Complaints:  Pt reports had large BM with bowel program last night- still not voiding since 1x last week.  Slept OK Per nursing note, refused lidoderm patches.  Admits, is still pretty depressed- will try Wellbutrin in addition to Cymbalta. Is allergic to Wellbutrin- will add low dose Celexa.      ROS:   Pt denies SOB, abd pain, CP, N/V/C/D, and vision changes   Objective:   No results found. Recent Labs    02/01/20 0539  WBC 5.9  HGB 10.3*  HCT 33.6*  PLT 237   Recent Labs    02/01/20 0539  NA 137  K 4.5  CL 98  CO2 28  GLUCOSE 139*  BUN 25*  CREATININE 0.81  CALCIUM 10.9*    Intake/Output Summary (Last 24 hours) at 02/02/2020 0855 Last data filed at 02/02/2020 0836 Gross per 24 hour  Intake 360 ml  Output 1783 ml  Net -1423 ml    Physical Exam: Vital Signs Blood pressure 103/67, pulse (!) 108, temperature 99.3 F (37.4 C), temperature source Oral, resp. rate 16, height 5\' 9"  (1.753 m), weight 68.9 kg, SpO2 100 %. Constitutional: No distress . Vital signs reviewed.sitting up in bedside chair, depressed/flat affect, NAD HEENT: EOMI, oral membranes moist-O2 via [place] 2L Neck: supple Cardiovascular: tachycardic in 100s- regular rhythm  Respiratory/Chest: CTA B/L- no W/R/R- good air movement GI/Abdomen: Soft, NT, ND, (+)BS  Ext: no clubbing, cyanosis, or edema- very tight in L>R upper traps- also bones more prominent losing weight Psych: depressed/flat affect; cooperative Skin: Warm and dry.  Intact. Musc: No edema in extremities.  No tenderness in extremities. Neuro: Alert but decreased memory. Very with it this AM- telling me all the issues.  Motor: Bilateral upper extremities: Grossly 5/5 proximal distal Bilateral lower extremities: Grossly 4+/5 proximal to distal, stable  Assessment/Plan: 1. Functional deficits which require 3+ hours per day of interdisciplinary therapy in a  comprehensive inpatient rehab setting.  Physiatrist is providing close team supervision and 24 hour management of active medical problems listed below.  Physiatrist and rehab team continue to assess barriers to discharge/monitor patient progress toward functional and medical goals  Care Tool:  Bathing    Body parts bathed by patient: Right arm,Left arm,Chest,Abdomen,Right upper leg,Left upper leg,Face,Front perineal area   Body parts bathed by helper: Buttocks,Right lower leg,Left lower leg Body parts n/a: Front perineal area,Buttocks,Right lower leg,Left lower leg   Bathing assist Assist Level: Minimal Assistance - Patient > 75%     Upper Body Dressing/Undressing Upper body dressing   What is the patient wearing?: Pull over shirt    Upper body assist Assist Level: Set up assist    Lower Body Dressing/Undressing Lower body dressing      What is the patient wearing?: Incontinence brief     Lower body assist Assist for lower body dressing: Total Assistance - Patient < 25%     Toileting Toileting    Toileting assist Assist for toileting: Moderate Assistance - Patient 50 - 74% Assistive Device Comment: BSC   Transfers Chair/bed transfer  Transfers assist     Chair/bed transfer assist level: Minimal Assistance - Patient > 75% Chair/bed transfer assistive device: Programmer, multimedia   Ambulation assist      Assist level: Minimal Assistance - Patient > 75% Assistive device: Walker-rolling Max distance: 60'   Walk 10 feet activity   Assist     Assist level: Minimal Assistance -  Patient > 75% Assistive device: Walker-rolling   Walk 50 feet activity   Assist Walk 50 feet with 2 turns activity did not occur: Safety/medical concerns  Assist level: Minimal Assistance - Patient > 75% Assistive device: Walker-rolling    Walk 150 feet activity   Assist Walk 150 feet activity did not occur: Safety/medical concerns         Walk 10 feet  on uneven surface  activity   Assist Walk 10 feet on uneven surfaces activity did not occur: Safety/medical concerns         Wheelchair     Assist Will patient use wheelchair at discharge?: Yes Type of Wheelchair: Manual    Wheelchair assist level: Supervision/Verbal cueing Max wheelchair distance: 100'    Wheelchair 50 feet with 2 turns activity    Assist        Assist Level: Supervision/Verbal cueing   Wheelchair 150 feet activity     Assist      Assist Level: Moderate Assistance - Patient 50 - 74%   Blood pressure 103/67, pulse (!) 108, temperature 99.3 F (37.4 C), temperature source Oral, resp. rate 16, height 5\' 9"  (1.753 m), weight 68.9 kg, SpO2 100 %.  Medical Problem List and Plan: 1.  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  1/7- spoke to brother yesterday who said he'd pass on to daughter- sounds like he didn't- palliative care to see pt  1/10- palliative saw pt- is DNAR/DNI now  Continue CIR  2.  Antithrombotics: -DVT/anticoagulation:   Heparin.  Mechanical: Sequential compression devices, below knee Bilateral lower extremities   Dopplers negative for DVT  -antiplatelet therapy: N/A 3. Pain Management:    Decreased oxycodone to q4H prn.  DC gabapentin since causing constipation  Added Cymbalta 30 mg daily.   1/3- will increase Duloxetine to 60 mg nightly- for nerve pain  1/5- not taking pain meds often- encouraged pt to take pain meds before therapy.   1/6- will reduce oxy to Norco q4 hours prn due to increased confusion per family (nursing hasn't seen it)  with pain meds  1/9 pt told me his pain is under control presently  1/10- will place  lidoderm patches on shoulders/upper traps B/L where muscles REAL tight.   1/11- can't have lidoderm in >3 patches, but refused yesterday- will check into this.  4. Mood: LCSW to follow for evaluation and support.              -antipsychotic agents: N/a 5. Neuropsych: This patient is capable of making decisions on his own behalf.  Appreciate neuropsych 6. Skin/Wound Care: Routine pressure relief measures.  7. Fluids/Electrolytes/Nutrition: Monitor I/Os.             Added juven to promote healing.  8. Neurogenic bladder: Foley in place due to urinary retention/UTI, with plans to remove on Monday  Flomax daily initiated on 12/30, monitor blood pressure closely  Florinef 0.1 mg daily initiated to assist with low BP.   Improving on 1/2  1/3- will d/c foley this afternoon, after radiation  1/4- wasn't removed- will remove today- this AM and ordered I/o caths and PVRs since might not void.  1/5- so far, no voiding-  cathing required 100%   1/6-9- no voiding- caths volumes 300-600c q6 hours  1/10- no voiding- in/out caths being done- pt doesn't want foley back.  1/11- no voiding still  9.  Acute renal failure: Resolved  Creatinine 0.86 on 12/27  10. Acute blood loss anemia:   Hemoglobin 12.4 on 12/28  Continue to monitor 11. Metastatic Lung AdenoCA:  On dulera--has been on supplemental oxygen  1/4- didn't get radiation yesterday- is scheduled ot go back today.  1/6- Getting radiation daily M-F  12. Kleb/Proteus UTI: Completed course of Cipro 13. 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  1/5- refused bowel program last night  And as a result had accident this AM.   1/6- hard clumps of  stool- will add Miralax daily.   1/7- hard clumps- just added miralax so will wait to increase til this weekend.   1/9 pt had small pebbles with bp last night. Will increase miralax to bid   -add metamucil   1/10- added Sorbitol today x1- and follow with suppository  1/11- pt had good BM last night.   14. Hypotension-   Severe orthostasis, negative on 12/31  Relatively controlled on 1/2  TEDs/abdominal binder for support.   1/5- added midodrine 5 mg TID-AC for low Bps  1/7- increased Midodrine to 10 mg TID- will also increase Florinef to help with orthostatic hypotension. It's really bad when goes to radiation  1/9 bp more robust with recent changes  15.  Sleep disturbance: Scheduled melatonin at nights.   Trazodone 100  1/3- sleeping well- con't regimen 17. Tenting/mild dehydration- will push 6 cups/water daily minimum  1/6- skin very dry- order eucerin AND push PO fluids 18. Hyperkalemia  Lokelma ordered on 12/23  Potassium 4.6 on 12/27 19. Pancoast tumor/ metastatic lung cancer:   Radiation to begin on 1/3 and to see Dr Julien Nordmann 1/3 at 2:15 pm  1/4- didn't get radiation- will make sure scheduled to  go back today  1/5- made sure pt went to radiation -his BP was low- but gave him 500cc IVFs NS bolus to help with BP- BP was 82/50s.   1/7- Increased Midodrine and florienf to help with low BP.  20. Dysphagia per pt  12/30- ordered SLP eval and treat  1/3- placed on D3 thin diet  1/4- is better when sitting up at 90 degrees  1/6- will add Protonix for reflux Sx's.   1/8 MBS performed 1/7--mod oral/mild pharyng dysphagia   -D1, thins recommended by SLP, meds with puree  1/10-  Pt says doesn't help much with D1 thins.  21. Hypercalcemia  1/4- is down to 11 from 11.2- will monitor- if goes back up more, will treat. 22. Poor appetite  1/10- will try Marinol 2.5 mg BID 22. Depression  1/11- on Cymbalta but cannot add Wellbutrin- has allergy- will put small dose of Celexa 10 mg daily on  board-so he doesn't have issues with serotonin syndrome.         LOS: 21 days A FACE TO FACE EVALUATION WAS PERFORMED  Lessie Manigo 02/02/2020, 8:55 AM

## 2020-02-02 NOTE — Progress Notes (Signed)
Physical Therapy Session Note  Patient Details  Name: Gregory Bautista MRN: [id number] Date of Birth: 1940-05-10  Today's Date: 02/02/2020 PT Individual Time: 0900-1000 PT Individual Time Calculation (min): 60 min   Short Term Goals: Week 3:  PT Short Term Goal 1 (Week 3): =LTG due to ELOS  Skilled Therapeutic Interventions/Progress Updates:    Pt received seated in w/c in room, agreeable to PT session. Pt reports ongoing pain in upper back and L axilla region, not rated and declines intervention. Pt noted to have 50% (+) of his breakfast still on tray in front of him, reports feeling overwhelmed at the thought of attempting to finish his breakfast and reports ongoing overall decrease in his appetite. Seated BP 101/64 with use of thigh-high TEDs and BLE ACE wrap. Sit to stand with min A to RW. Standing BP 80/51 with pt reporting "mild" dizziness. Deferred further standing activity due to low BP. Stand pivot transfer to [name] with RW and min A. [name] level 4 x 5 min with use of B UE/LE for global endurance training. Obtained abdominal binder for pt for BP management. With addition of abdominal binder and following exercise seated BP 147/72. Standing BP 93/65 with decreased reports of dizziness. Ambulation 2 x 50 ft with RW and min A for balance, pt reporting onset of fatigue and requesting to sit after 50 ft. Pt exhibits significant B shoulder elevation during gait, requires cues to decrease. Seated BP following gait 132/76 with no complaints of dizziness during gait. Manual w/c propulsion x 100 ft with use of BUE at Supervision level before onset of fatigue. Pt requests to return to bed at end of session. Sit to supine min A for LLE management. Pt left semi-reclined in bed with needs in reach, bed alarm in place. Removed abdominal binder prior to transfer back to supine. Pt on 2L O2 via [place], SpO2 at 100% with activity during session.  Therapy Documentation Precautions:  Precautions Precautions:  Back,Fall Precaution Comments: reviewed precautions Restrictions Weight Bearing Restrictions: No   Therapy/Group: Individual Therapy   Excell Seltzer, PT, DPT  02/02/2020, 12:10 PM

## 2020-02-02 NOTE — Progress Notes (Addendum)
Speech Language Pathology Weekly Progress and Session Note  Patient Details  Name: Gregory Bautista MRN: [id number] Date of Birth: [birth year]  Beginning of progress report period: January 25, 2020 End of progress report period: February 02, 2020  Today's Date: 02/02/2020 SLP Individual Time: 5638-9373 SLP Individual Time Calculation (min): 29 min  Short Term Goals: Week 1: SLP Short Term Goal 1 (Week 1): Pt will consume dys 3 textures and thin liquids with mod I use of swallowing precautions SLP Short Term Goal 1 - Progress (Week 1): Not met SLP Short Term Goal 2 (Week 1): Pt will consume therapeutic trials of regular textures to continue working towards diet progression with mod I use of swallowing precautions and no overt s/s of aspiration SLP Short Term Goal 2 - Progress (Week 1): Not met    New Short Term Goals: Week 2: SLP Short Term Goal 1 (Week 2): Pt will consume current diet with use of swallow and esphogeal strategies mod I.  Weekly Progress Updates: Pt was unable to meet goals this reporting period due to diet being downgrade due to baseline missing dentition and medical status. Pt is demonstrating difficulty expressing acute issues with swallow, but 1/7 MBS indicated oral weakness, mild pyriform sinuses residue and esophageal dismobility with no overt s/s aspiration. Pt is consuming dys 1 textures and thin liquids with supervision A verbal cues for use of strategies. Pt required inappropriate amount of time to masticate and transfer dys 2 textures, further impacted by missing dentition. SLP will continue to assess tolerance of least restrictive diet and carryover of swallow and esophogeal strategies. Pt would continue to benefit from skilled ST services in order to maximize functional independence and reduce burden of care, likely requiring supervision at discharge with continued skilled ST services.        Intensity: Minumum of 1-2 x/day, 30 to 90 minutes Frequency: 1 to 3  out of 7 days Duration/Length of Stay: 1/24 Treatment/Interventions: Dysphagia/aspiration precaution training;Cueing hierarchy   Daily Session  Skilled Therapeutic Interventions: Skilled ST services focused on swallow skills. SLP facilitated PO consumption of dys 1 textures and thin liquids via straw. Pt demonstrated reduced cohesion of pureed textures, requiring increase manipulation and increase oral transit time. Pt demonstrated appropriate oral clearance, no overt s/s aspiration and required supervision A verbal cues to alternate solids and liquids. Pt appeared distracted during meal, stating " just seems like I cant think anymore." SLP provided education pertaining to medical situtation and emotional support. Pt was left in room with call bell within reach and bed alarm set. SLP recommends to continue skilled services.    General    Pain Pain Assessment Pain Score: 0-No pain  Therapy/Group: Individual Therapy  Monnica Saltsman  [facility] 02/02/2020, 4:05 PM

## 2020-02-02 NOTE — Progress Notes (Signed)
Occupational Therapy Session Note  Patient Details  Name: Gregory Bautista MRN: [id number] Date of Birth: Aug 24, 1940  Today's Date: 02/02/2020 OT Individual Time: 0700-0755 OT Individual Time Calculation (min): 55 min    Short Term Goals: Week 3:  OT Short Term Goal 1 (Week 3): STG=LTG secondary to ELOS  Skilled Therapeutic Interventions/Progress Updates:    Pt resting in bed upon arrival with suction tubing in hand. Pt with weak voice this morning when responding to questions. Pt agreeable to getting OOB to eat breakfast. Pt's BLE still wrapped with ACE wraps. RN notified. Supine>sit EOB with supervision. Sitting balance with supervision. Sit<>stand from EOB with CGA and stand step transfer with RW to w/c. Pt required min verbal cues for initiation with setting up breakfast items and condiments. Sit<>stand from w/c X 5 with CGA and pt stated he was fatigued and wanted to eat breakfast. BP: supine 114/59 HR 106; sitting in w/c 104/63 HR 116. Pt remained seated in w/c eating breakfast with belt alarm activated. All needs within reach.   Therapy Documentation Precautions:  Precautions Precautions: Back,Fall Precaution Comments: reviewed precautions Restrictions Weight Bearing Restrictions: No  Pain: Pain Assessment Pain Scale: 0-10 Pain Score: 8  Pain Location: Generalized Pain Orientation: Left Pain Intervention(s): repositioned and emotional support    Therapy/Group: Individual Therapy  Leroy Libman 02/02/2020, 7:56 AM

## 2020-02-03 ENCOUNTER — Ambulatory Visit
Admission: RE | Admit: 2020-02-03 | Discharge: 2020-02-03 | Disposition: A | Discharge status: Home / Self Care | Payer: Medicare Other | Source: Ambulatory Visit | Attending: Radiation Oncology | Admitting: Radiation Oncology

## 2020-02-03 ENCOUNTER — Inpatient Hospital Stay (HOSPITAL_COMMUNITY): Payer: Medicare Other | Admitting: Physical Therapy

## 2020-02-03 ENCOUNTER — Encounter: Payer: Self-pay | Admitting: *Deleted

## 2020-02-03 ENCOUNTER — Inpatient Hospital Stay (HOSPITAL_COMMUNITY): Payer: Medicare Other

## 2020-02-03 DIAGNOSIS — R918 Other nonspecific abnormal finding of lung field: Secondary | ICD-10-CM | POA: Diagnosis not present

## 2020-02-03 MED ORDER — BOOST / RESOURCE BREEZE PO LIQD CUSTOM: 1.0000 | Freq: Two times a day (BID) | ORAL | Status: DC

## 2020-02-03 MED ORDER — FLEET ENEMA 7-19 GM/118ML RE ENEM: 1.0000 | ENEMA | Freq: Once | RECTAL | Status: DC

## 2020-02-03 NOTE — Progress Notes (Signed)
Gregory Bautista PHYSICAL MEDICINE & REHABILITATION PROGRESS NOTE   Subjective/Complaints:   No Signs or Sx's of low BP per OT in last 24 hours.  Slept OK- per pt, but then refused all his meds this AM- Says he's not taking Ensure plus-" it's too sticky".   Also still has poor appetite- hasn't noticed an improvement so far even on marinol.   Didn't have results from bowel program- nurse recommends an enema- ordered Fleets for tonight.    ROS:   Pt denies SOB, abd pain, CP, N/V/C/D, and vision changes    Objective:   No results found. Recent Labs    02/01/20 0539  WBC 5.9  HGB 10.3*  HCT 33.6*  PLT 237   Recent Labs    02/01/20 0539  NA 137  K 4.5  CL 98  CO2 28  GLUCOSE 139*  BUN 25*  CREATININE 0.81  CALCIUM 10.9*    Intake/Output Summary (Last 24 hours) at 02/03/2020 0914 Last data filed at 02/03/2020 7425 Gross per 24 hour  Intake 0 ml  Output 1000 ml  Net -1000 ml    Physical Exam: Vital Signs Blood pressure 119/84, pulse (!) 110, temperature 98.9 F (37.2 C), temperature source Oral, resp. rate 17, height 5\' 9"  (1.753 m), weight 63.9 kg, SpO2 100 %. Constitutional: sitting up in bedside chair- ate <10% of breakfast, OT in room, depressed/tired affect, NAD HEENT: EOMI, oral membranes moist-O2 via [place] 2L Neck: supple Cardiovascular: tachycardic= regular rhythm= in 100s Respiratory/Chest: CTA B/L- no W/R/R- good air movement GI/Abdomen: soft, NT, ND, (+) hypoactive  Ext: no clubbing, cyanosis, or edema- very tight in L>R upper traps- also bones more prominent -losing weight Psych: depressed/flat affect; cooperative Skin: Warm and dry.  Intact. Musc: No edema in extremities.  No tenderness in extremities. Neuro: Alert but decreased memory. Doesn't like to "complain", but appears with it and appropriate in answers today- match per nurse.  Motor: Bilateral upper extremities: Grossly 5/5 proximal distal Bilateral lower extremities: Grossly 4+/5 proximal to  distal, stable  Assessment/Plan: 1. Functional deficits which require 3+ hours per day of interdisciplinary therapy in a comprehensive inpatient rehab setting.  Physiatrist is providing close team supervision and 24 hour management of active medical problems listed below.  Physiatrist and rehab team continue to assess barriers to discharge/monitor patient progress toward functional and medical goals  Care Tool:  Bathing    Body parts bathed by patient: Right arm,Left arm,Chest,Abdomen,Right upper leg,Left upper leg,Face,Front perineal area   Body parts bathed by helper: Buttocks,Right lower leg,Left lower leg Body parts n/a: Front perineal area,Buttocks,Right lower leg,Left lower leg   Bathing assist Assist Level: Minimal Assistance - Patient > 75%     Upper Body Dressing/Undressing Upper body dressing   What is the patient wearing?: Pull over shirt    Upper body assist Assist Level: Set up assist    Lower Body Dressing/Undressing Lower body dressing      What is the patient wearing?: Incontinence brief,Pants     Lower body assist Assist for lower body dressing: Moderate Assistance - Patient 50 - 74%     Toileting Toileting    Toileting assist Assist for toileting: Moderate Assistance - Patient 50 - 74% Assistive Device Comment: BSC   Transfers Chair/bed transfer  Transfers assist     Chair/bed transfer assist level: Minimal Assistance - Patient > 75% Chair/bed transfer assistive device: Programmer, multimedia   Ambulation assist      Assist level: Minimal Assistance -  Patient > 75% Assistive device: Walker-rolling Max distance: 50'   Walk 10 feet activity   Assist     Assist level: Minimal Assistance - Patient > 75% Assistive device: Walker-rolling   Walk 50 feet activity   Assist Walk 50 feet with 2 turns activity did not occur: Safety/medical concerns  Assist level: Minimal Assistance - Patient > 75% Assistive device:  Walker-rolling    Walk 150 feet activity   Assist Walk 150 feet activity did not occur: Safety/medical concerns         Walk 10 feet on uneven surface  activity   Assist Walk 10 feet on uneven surfaces activity did not occur: Safety/medical concerns         Wheelchair     Assist Will patient use wheelchair at discharge?: Yes Type of Wheelchair: Manual    Wheelchair assist level: Supervision/Verbal cueing Max wheelchair distance: 100'    Wheelchair 50 feet with 2 turns activity    Assist        Assist Level: Supervision/Verbal cueing   Wheelchair 150 feet activity     Assist      Assist Level: Moderate Assistance - Patient 50 - 74%   Blood pressure 119/84, pulse (!) 110, temperature 98.9 F (37.2 C), temperature source Oral, resp. rate 17, height 5\' 9"  (1.753 m), weight 63.9 kg, SpO2 100 %.  Medical Problem List and Plan: 1.  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  1/7- spoke to brother yesterday who said he'd pass on to daughter- sounds like he didn't- palliative care to see pt  1/10- palliative saw pt- is DNAR/DNI now  1/11- called Oncology on call- trying to figure out plan for treatment- was told 12/27 that would do radiation and then immediately do Chemo, however, due to dispo issues (family cannot take home) needs to go to SNF after radiation- spoke at length to Dr Burr Medico- she or Dr Julien Nordmann will see pt today/tomorrow.   Continue CIR  2.  Antithrombotics: -DVT/anticoagulation:   Heparin.  Mechanical: Sequential compression devices, below knee Bilateral lower extremities   Dopplers negative for DVT  -antiplatelet therapy: N/A 3. Pain Management:     Decreased oxycodone to q4H prn.  DC gabapentin since causing constipation  Added Cymbalta 30 mg daily.   1/3- will increase Duloxetine to 60 mg nightly- for nerve pain  1/5- not taking pain meds often- encouraged pt to take pain meds before therapy.   1/6- will reduce oxy to Norco q4 hours prn due to increased confusion per family (nursing hasn't seen it)  with pain meds  1/9 pt told me his pain is under control presently  1/10- will place lidoderm patches on shoulders/upper traps B/L where muscles REAL tight.   1/11- can't have lidoderm in >3 patches, but refused yesterday- will check into this.  1/12- saw after left room, pt refused ALL meds this AM-  4. Mood: LCSW to follow for evaluation and support.              -antipsychotic agents: N/a 5. Neuropsych: This patient is capable of making decisions on his own behalf.  Appreciate neuropsych 6. Skin/Wound Care: Routine pressure relief measures.  7. Fluids/Electrolytes/Nutrition: Monitor  I/Os.             Added juven to promote healing.  8. Neurogenic bladder: Foley in place due to urinary retention/UTI, with plans to remove on Monday  Flomax daily initiated on 12/30, monitor blood pressure closely  Florinef 0.1 mg daily initiated to assist with low BP.   Improving on 1/2  1/3- will d/c foley this afternoon, after radiation  1/4- wasn't removed- will remove today- this AM and ordered I/o caths and PVRs since might not void.  1/5- so far, no voiding- cathing required 100%   1/6-9- no voiding- caths volumes 300-600c q6 hours  1/10- no voiding- in/out caths being done- pt doesn't want foley back.  1/11- no voiding still  1/12- no voiding- doing I/o caths- nursing doing- will need foley when goes to SNF  9.  Acute renal failure: Resolved  Creatinine 0.86 on 12/27  10. Acute blood loss anemia:   Hemoglobin 12.4 on 12/28  Continue to monitor 11. Metastatic Lung AdenoCA:  On dulera--has been on supplemental oxygen  1/4- didn't get  radiation yesterday- is scheduled ot go back today.  1/6- Getting radiation daily M-F  12. Kleb/Proteus UTI: Completed course of Cipro 13. 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  1/5- refused bowel program last night  And as a result had accident this AM.   1/6- hard clumps of stool- will add Miralax daily.   1/7- hard clumps- just added miralax so will wait to increase til this weekend.   1/9 pt had small pebbles with bp last night. Will increase miralax to bid   -add metamucil   1/10- added Sorbitol today x1- and follow with suppository  1/11- pt had good BM last night.    1/12- will give enema tonight per request of nursing 14. Hypotension-   Severe orthostasis, negative on 12/31  Relatively controlled on 1/2  TEDs/abdominal binder for support.   1/5- added midodrine 5 mg TID-AC for low Bps  1/7- increased Midodrine to 10 mg TID- will also increase Florinef to help with orthostatic hypotension. It's really bad when goes to radiation  1/9 bp more robust with recent changes  1/12- doing better today per OT, but then refused all AM meds-   15.  Sleep disturbance: Scheduled melatonin at nights.   Trazodone 100  1/3- sleeping well- con't regimen 17. Tenting/mild dehydration- will push 6 cups/water daily minimum  1/6- skin very dry- order eucerin AND push PO fluids 18. Hyperkalemia  Lokelma ordered on 12/23  Potassium 4.6 on 12/27 19. Pancoast tumor/ metastatic lung cancer:   Radiation to begin on 1/3 and to see Dr Julien Nordmann 1/3 at 2:15 pm  1/4- didn't get radiation- will make sure scheduled to  go back today  1/5- made sure pt  went to radiation -his BP was low- but gave him 500cc IVFs NS bolus to help with BP- BP was 82/50s.   1/7- Increased Midodrine and florienf to help with low BP.  20. Dysphagia per pt  12/30- ordered SLP eval and treat  1/3- placed on D3 thin diet  1/4- is better when sitting up at 90 degrees  1/6- will add Protonix for reflux Sx's.   1/8 MBS performed 1/7--mod oral/mild pharyng dysphagia   -D1, thins recommended by SLP, meds with puree  1/10-  Pt says doesn't help much with D1 thins.   1/12- per SLP, working on D2, but without teeth, cannot do D2 right now until passed trials 21. Hypercalcemia  1/4- is down to 11 from 11.2- will monitor- if goes back up more, will treat. 22. Poor appetite  1/10- will try Marinol 2.5 mg BID  1/12- not working as of yet- placed nutrition consult since not taking supplements - "too sticky".  22. Depression  1/11- on Cymbalta but cannot add Wellbutrin- has allergy- will put small dose of Celexa 10 mg daily on board-so he doesn't have issues with serotonin syndrome.   1/12- think it's the reason pt refused meds this AM, encouraged pt to take his meds for low BP, depression, etc.      I spent a total of 40 minutes on total care today- >50% coordination of care.  Called multiple places to get ahold of Oncology- spoke with Dr Burr Medico- she or Dr Julien Nordmann will come see pt today/tomorrow and also calling nutrition.  Of note PET scan was cancelled since in hospital-     LOS: 22 days A FACE TO FACE EVALUATION WAS PERFORMED  Gregory Bautista 02/03/2020, 9:14 AM

## 2020-02-03 NOTE — Progress Notes (Addendum)
Patient ID: Gregory Bautista, male   DOB: [birth year], 80 y.o.   MRN: [id number]  SW spoke with Juliann Pulse [facility] to discuss referral accepted in Epic by Kelly/Admissions coordinator. SW waiting on updates if the SNF will continue to extend bed offer since aware of d/c date. SW waiting on follow-up.   NCPASRR#: [id number] [place], MSW, [place] Office: [phone] Cell: [phone] Fax: [phone]

## 2020-02-03 NOTE — Progress Notes (Signed)
Occupational Therapy Session Note  Patient Details  Name: Gregory Bautista MRN: [id number] Date of Birth: 1940-12-03  Today's Date: 02/03/2020 OT Individual Time: 3779-3968 OT Individual Time Calculation (min): 54 min    Short Term Goals: Week 3:  OT Short Term Goal 1 (Week 3): STG=LTG secondary to ELOS OT Short Term Goal 1 - Progress (Week 3): Progressing toward goal  Skilled Therapeutic Interventions/Progress Updates:    Pt resting in bed upon arrival with TV on. Pt agreeable to getting OOB but required max verbal cues to initiate all transitional movements. Pt dependent for donning Ted hose and BLE ACE wraps. Supine>sit EOB with supervision. Pt's daughter called on pt's cell phone while sitting EOB. Pt required more then a reasonable amount of time to answer phone.  Pt was minimally responsive to his daughter's questoins/inquiries. Pt required mod A for donning pants and min A for standing balance when pulling over hips. Stand pivot transfer to w/c with min A. Pt remained seated in w/c with breakfast tray on table. All needs within reach.  Therapy Documentation Precautions:  Precautions Precautions: Back,Fall Precaution Comments: reviewed precautions Restrictions Weight Bearing Restrictions: No Pain:     Therapy/Group: Individual Therapy  Leroy Libman 02/03/2020, 7:54 AM

## 2020-02-03 NOTE — Progress Notes (Signed)
Patient was a no show to his appt with Dr. Julien Nordmann this week due to him in rehab at this time. He is re-scheduled for next week.

## 2020-02-03 NOTE — Progress Notes (Signed)
Patient refused all medications this morning.  Patient in no distress at this time.

## 2020-02-03 NOTE — Progress Notes (Signed)
Physical Therapy Session Note  Patient Details  Name: Najeh Credit MRN: [id number] Date of Birth: August 11, 1940  Today's Date: 02/03/2020 PT Individual Time: 1100-1200 PT Individual Time Calculation (min): 60 min   Short Term Goals: Week 3:  PT Short Term Goal 1 (Week 3): =LTG due to ELOS  Skilled Therapeutic Interventions/Progress Updates:    Pt received seated in w/c in room, agreeable to PT session. No complaints of pain. Seated BP 104/72 with use of thigh-high TED hose and BLE ACE wrap. Sit to stand with min A to RW. Standing BP 73/52 but pt asymptomatic. Pt returned to sitting. Assisted pt with donning abdominal binder while seated in w/c. Sit to stand again to RW with min A. Standing BP 85/64. Deferred standing tasks this session due to ongoing low BP in standing positions this date. [name] level 4 x 7 min with use of B UE/LE for global endurance training while listening to patient-chosen music of [employer]. Seated BP following [name] 133/68. Standing BP 84/61. Pt initially on 2L O2 at beginning of session, SpO2 100%. Decreased pt to 1L O2, SpO2 remains at 98% and higher with activity on 1L O2. Manual w/c propulsion 2 x 100 ft with use of BUE at Supervision level up/down incline and across level ground. Cotreatment session with LRT with focus on engaging patient in discussion regarding his musical past and music he enjoys listening too. Pt left seated in w/c in room with needs in reach, quick release belt and chair alarm in place at end of session.  Therapy Documentation Precautions:  Precautions Precautions: Back,Fall Precaution Comments: reviewed precautions Restrictions Weight Bearing Restrictions: No   Therapy/Group: Individual Therapy   Excell Seltzer, PT, DPT  02/03/2020, 12:29 PM

## 2020-02-03 NOTE — Patient Care Conference (Signed)
Inpatient RehabilitationTeam Conference and Plan of Care Update Date: 02/02/2020   Time: 11:00 AM    Patient Name: Gregory Bautista      Medical Record Number: [id number]  Date of Birth: [birth year] Sex: Male         Room/Bed: 4M01C/4M01C-01 Payor Info: Payor: [employer] MEDICARE / Plan: [facility] MEDICARE / Product Type: *No Product type* /    Admit Date/Time:  01/12/2020  3:14 PM  Primary Diagnosis:  Thoracic myelopathy  Hospital Problems: Principal Problem:   Thoracic myelopathy Active Problems:   Hyperkalemia   Neurogenic bladder   Sleep disturbance   Postoperative pain   Drug-induced constipation   Neurogenic bowel    Expected Discharge Date: Expected Discharge Date: 02/15/20  Team Members Present: Physician leading conference: Dr. Courtney Heys Care Coodinator Present: [name], LCSWA;Dequarius Jeffries Creig Hines, RN, BSN, [place] Nurse Present: Mohammed Kindle, RN PT Present: Excell Seltzer, PT OT Present: Willeen Cass, OT;Roanna Epley, COTA SLP Present: Charolett Bumpers, SLP PPS Coordinator present : Gunnar Fusi, SLP     Current Status/Progress Goal Weekly Team Focus  Bowel/Bladder   Bowel program, In/out cath Q6hr  regain B/B function  Daily bowel program, In/out cath Q6hr   Swallow/Nutrition/ Hydration   Dys 1 textures and thin liquids  Mod I  tolerance of diet and trials of dys 2 textures   ADL's   bathing-min A; UB dressing-supervsion; LB dressing-min/mod A; sit<>stand and standing balance with CGA/min A but fatigues quickly and requires multiple rest breaks; orthostatic BP with standing  supervision overall  activity tolerance, BADL training, functional transfers, standing balance, safety awareness   Mobility   Supervision bed mobility, min A transfers with RW, gait up to 134' RW min A, stairs mod A (has not been able to perform gait or stairs past few days due to low BP)  supervision overall  endurance, balance, gait as able, BP management   Communication              Safety/Cognition/ Behavioral Observations            Pain   Pain in the LUE (Refuses pain meds)  pain<3  Assess Q shift and PRN   Skin   Upper back insicion  proper healing, no infection  Assess Q shift and PRN     Discharge Planning:  Pt currently undergoing radiation treatment; last treatment on 1/20. Pt will need SNF placement as family is unable to assist with 24/7 care. Pt will not be eligible for SNF until he has completed radiation treatment. SNF placement is based on bed availability, and insurance approval.   Team Discussion: Patient stating more depressed, more constipated, not voiding but doesn't want a foley. Scheduled to receive 14 days of radiation. Has no complaints of pain and BP is fine this morning. Will be SNF placement. Patient on target to meet rehab goals: Has a lack of initiation to do things and needs lots of encouragement. Has decreased endurance and increased fatigue which could be due to the start of radiation. Goals are supervision overall. SLP performed a MBS showed mild dysmobility with esophagus. Recommending Dys 1, thin due to not having dentures available. Mod I are SLP goals.  *See Care Plan and progress notes for long and short-term goals.   Revisions to Treatment Plan:  Low dose Celexa was added to the treatment plan.  Teaching Needs: Family education, transfer training, balance training, medication management, etc.  Current Barriers to Discharge: Decreased caregiver support, Home enviroment access/layout, Neurogenic bowel  and bladder, Lack of/limited family support, Pending chemo/radiation, Behavior and Nutritional means  Possible Resolutions to Barriers: Continue current medications, offer nutritional supplements, provide emotional support to patient and family.     Medical Summary Current Status: more depressed; poor appetite- eats poorly; no voiding- doing bowel program- needs sorbitol last night.  Barriers to Discharge: Behavior;Decreased  family/caregiver support;Home enviroment access/layout;Pending chemo/radiation;Weight;Medical stability;Incontinence;Neurogenic Bowel & Bladder;Nutrition means  Barriers to Discharge Comments: will need to go to SNF- after radiation- still needs chemo; MBS 1/7- dysmobility with esophagus; puree the easiest for him (missing teeth) per SLP Possible Resolutions to [employer]: starting Celexa, bowel program; in/out caths; started marinol for appetite; losing weight; slow progress min A poor balance/endurance; BP keeps dropping with standing; 50 ft min A (was further last week- due to CA tx); lack of initiation; not as steady; SNF d/c 1/24? -   Continued Need for Acute Rehabilitation Level of Care: The patient requires daily medical management by a physician with specialized training in physical medicine and rehabilitation for the following reasons: Direction of a multidisciplinary physical rehabilitation program to maximize functional independence : Yes Medical management of patient stability for increased activity during participation in an intensive rehabilitation regime.: Yes Analysis of laboratory values and/or radiology reports with any subsequent need for medication adjustment and/or medical intervention. : Yes   I attest that I was present, lead the team conference, and concur with the assessment and plan of the team.   Dorthula Nettles G 02/03/2020, 10:06 AM

## 2020-02-03 NOTE — Progress Notes (Signed)
Physical Therapy Session Note  Patient Details  Name: Gregory Bautista MRN: [id number] Date of Birth: 1940-06-02  Today's Date: 02/03/2020 PT Individual Time: 0935-1010 PT Individual Time Calculation (min): 35 min   Short Term Goals: Week 3:  PT Short Term Goal 1 (Week 3): =LTG due to ELOS  Skilled Therapeutic Interventions/Progress Updates:  Pt presented in w/c with brother present agreeable to therapy. Pt initially refused but brother provided encouragement to get pt to participate. Pt c/o pain 5/10 upper L chest but requested no intervention. BP checked prior to leaving room 160/143 (144) HR 115 second time as first time BP in similar numbers. Nsg notified and abdominal binder removed. After a few mintues BP checked again with nsg present 108/71 (80) HR 102. Pt then transported to ortho gym and pt stood to check BP 99/69 (79) HR 129. Pt then noted to be incontinent of bowel with pt stating "they like to keep me this way". Pt taken back to room and performed STS with supervision and PTA performed peri-care total A for time mangement. Pt was able to maintain standing ~2 min prior to requesting to sit due to s/s hypotension. Pt left in w/c at end of session with chaplain arriving and current needs met.      Therapy Documentation Precautions:  Precautions Precautions: Back,Fall Precaution Comments: reviewed precautions Restrictions Weight Bearing Restrictions: No General:   Vital Signs: Therapy Vitals Temp: 97.6 F (36.4 C) Pulse Rate: 100 Resp: 18 BP: 119/72 Patient Position (if appropriate): Sitting Oxygen Therapy SpO2: (!) 86 % O2 Device: Nasal Cannula Pain:   Mobility:   Locomotion :    Trunk/Postural Assessment :    Balance:   Exercises:   Other Treatments:      Therapy/Group: Individual Therapy  Gregory Bautista 02/03/2020, 4:16 PM

## 2020-02-03 NOTE — Progress Notes (Signed)
Pt refused all medications overnight and this am. Pt also refused enema last night after dig stim and suppository were unsuccessful for bm. Last bm 1/10. RN encouraged pt to discuss plan of care today with staff today. Day shift RN made aware.

## 2020-02-03 NOTE — Progress Notes (Signed)
Occupational Therapy Weekly Progress Note  Patient Details  Name: Gregory Bautista MRN: [id number] Date of Birth: November 04, 1940  Beginning of progress report period: January 27, 2020 End of progress report period: February 03, 2020  Pt's initial discharge date changed from 1/12 to 1/24 to facilitate pt completing radiation treatments before discharging. Pt continues to require max verbal cues and extra time to initiate functional tasks. Bed mobility with supervision. Sit<>stand from EOB/w/c and stand pivot tranfsers with min A. Pt requires assistance with threading BLE into pants. Pt initiates pulling pants over hips but requires assistance to complete tasks. Pt presently standing approx 2 mins during functional tasks before requesting to rest. Pt fatigues quickly and requires multiple rest breaks. Patient continues to demonstrate the following deficits: muscle weakness, decreased cardiorespiratoy endurance and decreased standing balance, decreased postural control and difficulty maintaining precautions and therefore will continue to benefit from skilled OT intervention to enhance overall performance with BADL and Reduce care partner burden.  Patient progressing toward long term goals..  Continue plan of care.  OT Short Term Goals Week 3:  OT Short Term Goal 1 (Week 3): STG=LTG secondary to ELOS OT Short Term Goal 1 - Progress (Week 3): Progressing toward goal Week 4:  OT Short Term Goal 1 (Week 4): Pt will don pants with min A sit<>stand from w/c or EOB OT Short Term Goal 2 (Week 4): Pt will perform toilet transfers with CGA using RW OT Short Term Goal 3 (Week 4): Pt will tolerate standing for 3 mins during functional tasks/ADLs with CGA    Leroy Libman 02/03/2020, 6:43 AM

## 2020-02-03 NOTE — Progress Notes (Signed)
Patient refused night meds.  Patient stated to NT that he is having trouble swallowing and that's why he doesn't eat.  RN will send message to ST and see if we can get another swallow study.

## 2020-02-03 NOTE — Progress Notes (Signed)
I received a call from Dr. Dagoberto Ligas this morning regarding this pt's oncological care. I reviewed his chart. He was scheduled to see my partner Dr. Julien Nordmann in our clinic initially but was not able to come due to his inpt rehab.   I have spoken with Dr. Julien Nordmann and he will see pt on this Friday in rehab.   Gregory Bautista  02/03/2020

## 2020-02-03 NOTE — Evaluation (Signed)
Recreational Therapy Assessment and Plan  Patient Details  Name: Gregory Bautista MRN: [id number] Date of Birth: Jun 24, 1940 Today's Date: 02/03/2020  Rehab Potential:  Fair ELOS:  SNF  Assessment   Hospital Problem: Principal Problem:   Thoracic myelopathy   Past Medical History:      Past Medical History:  Diagnosis Date  . Allergic rhinitis    NEC  . Asthma   . Glaucoma   . 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 Tobacco use disorder 09/24/2019   Past Surgical History:       Past Surgical History:  Procedure Laterality Date  . COLON SURGERY    . EYE SURGERY Right   . 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 Tumor was not felt to be resectable due to advanced disease and spine stabilization recommended with plans for chemo and XRT in the future. He was evaluated by Dr. JArnoldo Moraleand was admitted on 12/30/2019 for T2-T4 laminectomy for tumor resection with T1-T5 posterior arthrodesis with pedicle screws and rods. 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 Plans are for XRT by Dr. KRanda Ngowith reports to of simulation set for 12/27. Had been set for follow up with Dr. MEarlie Serveron outpatient basis.   Hospital course has been significant for acute renal failure, overflow  incontinence with bladder volumes up to 1250 ml and failed voiding trial therefore foley replaced as well as hypoxia with activity-->remains on supplemental oxygen. Urine culture was done showing evidence of Proteus and Klebsiella UTI and he was started on Cipro today. He has had ongoing issues with resting tachycardia felt to be due to dehydration. Patient continues to be limited by BLE weakness with instability, left chest pain with burning and has been weaned off oxygen. He continues to have limitations in mobility and ADLS. CIR recommended due to functional decline. Patient transferred to CIR on 01/12/2020 .   Patient currently requires mod with mobility secondary to muscle weakness, decreased cardiorespiratoy endurance and decreased oxygen support and decreased standing balance, decreased postural control, decreased balance strategies and difficulty maintaining precaution Limiting pt's independence with leisure/community pursuits.  Met with pt today to discuss leisure interests and stress management during co-treat with PT.  Pt easily engaged in conversation sharing that his faith has been and is currently the major source of strength and comfort.  Pt also shared his love of music, specifically gospel music as a stress relieving activity.  Music incorporated to PT exercise this session with positive results.  Pt later shared that he played the electric guitar and previously sang in a gospel group.  Pt smiling when discussing his love of music.     Plan Min 1 TR session or co-treatment >20 minutes per week during LOS  Recommendations for other services: Neuropsych  Discharge Criteria: Patient will be discharged from TR if patient refuses treatment 3 consecutive times  without medical reason.  If treatment goals not met, if there is a change in medical status, if patient makes no progress towards goals or if patient is discharged from hospital.  The above assessment, treatment plan, treatment  alternatives and goals were discussed and mutually agreed upon: by patient  [place] 02/03/2020, 2:42 PM

## 2020-02-03 NOTE — Progress Notes (Addendum)
Initial Nutrition Assessment  RD working remotely.  DOCUMENTATION CODES:   Severe malnutrition in context of acute illness/injury  INTERVENTION:   Given significant weight loss (10.9% in less than 1 month), ongoing poor PO intake (0-25% of meals), and severe acute malnutrition, recommend placement of Cortrak NG tube and initiation of enteral nutrition if within [place]. Recommend: - Start Osmolite 1.5 @ 20 ml/hr and advance by 10 ml q 8 hours to goal rate of 60 ml/hr (1440 ml/day) - ProSource TF 45 ml daily  Recommended tube feeding regimen would provide 2200 kcal, 101 grams of protein, and 1097 ml of H2O (meets 100% of estimated needs).  - Continue Ensure Enlive po TID between meals, each supplement provides 350 kcal and 20 grams of protein (pt consuming some per RN)  - Trial Boost Breeze po BID with breakfast and dinner meals, each supplement provides 250 kcal and 9 grams of protein  - Continue 1 packet Juven po BID, each packet provides 95 calories, 2.5 grams of protein, and 9.8 grams of carbohydrate; also contains L-arginine and L-glutamine, vitamin C, vitamin E, vitamin B-12, zinc, calcium, and calcium Beta-hydroxy-Beta-methylbutyrate to support wound healing  - Magic Cup TID with meals, each supplement provides 290 kcal and 9 grams of protein  - Continue MVI with minerals daily  NUTRITION DIAGNOSIS:   Severe Malnutrition related to acute illness as evidenced by energy intake < or equal to 50% for > or equal to 5 days,percent weight loss (10.9% weight loss in less than 1 month).  GOAL:   Patient will meet greater than or equal to 90% of their needs  MONITOR:   PO intake,Supplement acceptance,Diet advancement,Labs,Weight trends  REASON FOR ASSESSMENT:   Consult Assessment of nutrition requirement/status  ASSESSMENT:   80 year old male with PMH of smoking. 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 Tumor was not felt to be resectable due to advanced disease. Pt was admitted on 12/30/19 for T2-T4 laminectomy for tumor resection with T1-T5 posterior arthrodesis with pedicle screws and rods. 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 Admitted to CIR on 12/21.   1/07 - MBS with recommendations for dysphagia 1 diet with thin liquids  Noted target d/c date of 1/24.  RD consulted for help with supplements as pt is refusing Ensure because it is "too sticky." Per notes, pt refusing all medications this morning.  Discussed pt with RN. Per RN, pt accepted dose of Ensure supplement around lunchtime today and accepted dose on Monday of this week. Pt prefers the Soil scientist per RN.  Attempted to speak with pt via phone call to room. On first attempt, phone call was answered then promptly cut off. No answer on second attempt.  Reviewed weight history in chart. Pt with a 7.8 kg weight loss since 01/18/20. This is a 10.9% weight loss in less than 1 month which is severe and significant for timeframe. Pt meets criteria for severe acute malnutrition.  Given significant weight loss and ongoing poor PO intake, recommend Cortrak NG tube placement and initiation of enteral nutrition. Pt with elevated kcal and protein needs related to cancer and cancer-related treatments.  Meal Completion: 0-25%  Medications reviewed and include: dulcolax, colace, marinol 2.5 mg BID, Ensure Enlive TID, MVI with minerals, melatonin, Juven, protonix, miralax, psyllium, senna  Labs reviewed: BUN 25 on  1/10  UOP: 1000 ml x 24 hours  NUTRITION - FOCUSED PHYSICAL EXAM:  Unable to complete at this time. RD working remotely.  Diet Order:   Diet Order            DIET - DYS 1 Room service appropriate? Yes; Fluid consistency: Thin  Diet effective now                 EDUCATION NEEDS:   Not  appropriate for education at this time  Skin:  Skin Assessment: Skin Integrity Issues: Incisions: back  Last BM:  02/02/20 small type 4  Height:   Ht Readings from Last 1 Encounters:  01/12/20 5\' 9"  (1.753 m)    Weight:   Wt Readings from Last 1 Encounters:  02/02/20 63.9 kg    BMI:  Body mass index is 20.8 kg/m.  Estimated Nutritional Needs:   Kcal:  2000-2200  Protein:  100-115 grams  Fluid:  >/= 2.0 L    Gustavus Bryant, MS, RD, LDN Inpatient Clinical Dietitian Please see [facility] for contact information.

## 2020-02-03 NOTE — Progress Notes (Signed)
Patient expressed pain on left rib cage up to arm pit.  RN offered the Lidocaine patch again and patient did accept one patch to be placed.  RN offered Ensure and patient to accept.  Patient declined any pain medicine.

## 2020-02-03 NOTE — Progress Notes (Signed)
Physical Therapy Weekly Progress Note  Patient Details  Name: Gregory Bautista MRN: [id number] Date of Birth: June 30, 1940  Beginning of progress report period: January 27, 2020 End of progress report period: February 03, 2020  Today's Date: 02/03/2020  Patient has met 0 of 1 short term goals.  STG were set to LTG due to ELOS. Pt's LOS has been extended to allow time for patient to complete radiation treatment prior to d/c from hospital. Plan is for patient to d/c to SNF due to lack of family support available upon pt's discharge and ongoing 24/7 care that patient will continue to require. Pt continues to make slow but steady progress towards therapy goals. Pt has been limited at times by endurance as well as orthostatic hypotension limiting ability to participate in functional standing activities. Pt is currently min A overall for transfers and short distance gait with RW. Pt is Supervision for w/c mobility.  Patient continues to demonstrate the following deficits muscle weakness, decreased cardiorespiratoy endurance and decreased oxygen support, decreased awareness, decreased problem solving, decreased safety awareness, decreased memory and delayed processing and decreased standing balance, decreased postural control and decreased balance strategies and therefore will continue to benefit from skilled PT intervention to increase functional independence with mobility.  Patient progressing toward long term goals..  Continue plan of care.  PT Short Term Goals Week 3:  PT Short Term Goal 1 (Week 3): =LTG due to ELOS PT Short Term Goal 1 - Progress (Week 3): Progressing toward goal Week 4:  PT Short Term Goal 1 (Week 4): =LTG due to ELOS  Therapy Documentation Precautions:  Precautions Precautions: Back,Fall Precaution Comments: reviewed precautions Restrictions Weight Bearing Restrictions: No   Therapy/Group: Individual Therapy   Excell Seltzer, PT, DPT  02/03/2020, 12:30 PM

## 2020-02-03 NOTE — Progress Notes (Signed)
This chaplain is present for F/U spiritual care.  The Pt. is sitting up in a wheelchair.   The chaplain noticed the Pt. breakfast tray was not touched.  The Pt. described the previous night's experience as a "chopping block."  The chaplain listened as the Pt. tearfully connected his experiences with the care experience. The Pt. stated to the chaplain the healthcare team member cleaning him up before the chaplain's visit was sent "to clean up the mess."  The chaplain understands the Pt. description of his relationship with God as an unconditional presence. The Pt. spirituality is woven into his choice of not taking his medication and continuing with radiation. The Pt. chooses to continue the think about the questions he is discovering.   The chaplain is available for F/U spiritual care.  The chaplain understands the Palliative Medicine Team will revisit and the Pt. RN-Karla was updated.

## 2020-02-04 ENCOUNTER — Inpatient Hospital Stay (HOSPITAL_COMMUNITY): Payer: Medicare Other | Admitting: Speech Pathology

## 2020-02-04 ENCOUNTER — Ambulatory Visit (HOSPITAL_COMMUNITY): Payer: Medicare Other | Admitting: *Deleted

## 2020-02-04 ENCOUNTER — Encounter (HOSPITAL_COMMUNITY): Payer: Self-pay | Admitting: Physical Medicine and Rehabilitation

## 2020-02-04 ENCOUNTER — Ambulatory Visit (HOSPITAL_COMMUNITY): Payer: Medicare Other

## 2020-02-04 ENCOUNTER — Inpatient Hospital Stay (HOSPITAL_COMMUNITY): Payer: Medicare Other | Admitting: *Deleted

## 2020-02-04 ENCOUNTER — Inpatient Hospital Stay (HOSPITAL_COMMUNITY): Payer: Medicare Other

## 2020-02-04 ENCOUNTER — Ambulatory Visit
Admission: RE | Admit: 2020-02-04 | Discharge: 2020-02-04 | Disposition: A | Discharge status: Home / Self Care | Payer: Medicare Other | Source: Ambulatory Visit | Attending: Radiation Oncology | Admitting: Radiation Oncology

## 2020-02-04 DIAGNOSIS — E43 Unspecified severe protein-calorie malnutrition: Secondary | ICD-10-CM | POA: Insufficient documentation

## 2020-02-04 DIAGNOSIS — H5461 Unqualified visual loss, right eye, normal vision left eye: Secondary | ICD-10-CM

## 2020-02-04 DIAGNOSIS — C3410 Malignant neoplasm of upper lobe, unspecified bronchus or lung: Secondary | ICD-10-CM

## 2020-02-04 DIAGNOSIS — R918 Other nonspecific abnormal finding of lung field: Secondary | ICD-10-CM | POA: Diagnosis not present

## 2020-02-04 LAB — COMPREHENSIVE METABOLIC PANEL
ALT: 27 U/L (ref 0–44)
AST: 22 U/L (ref 15–41)
Albumin: 2.4 g/dL — ABNORMAL LOW (ref 3.5–5.0)
Alkaline Phosphatase: 59 U/L (ref 38–126)
Anion gap: 13 (ref 5–15)
BUN: 21 mg/dL (ref 8–23)
CO2: 27 mmol/L (ref 22–32)
Calcium: 10.5 mg/dL — ABNORMAL HIGH (ref 8.9–10.3)
Chloride: 100 mmol/L (ref 98–111)
Creatinine, Ser: 0.8 mg/dL (ref 0.61–1.24)
GFR, Estimated: >60 mL/min (ref >60)
Glucose, Bld: 119 mg/dL — ABNORMAL HIGH (ref 70–99)
Potassium: 3.8 mmol/L (ref 3.5–5.1)
Sodium: 140 mmol/L (ref 135–145)
Total Bilirubin: 1 mg/dL (ref 0.3–1.2)
Total Protein: 7.5 g/dL (ref 6.5–8.1)

## 2020-02-04 LAB — CBC WITH DIFFERENTIAL/PLATELET
Abs Immature Granulocytes: 0.03 10*3/uL (ref 0.00–0.07)
Basophils Absolute: 0 10*3/uL (ref 0.0–0.1)
Basophils Relative: 0 %
Eosinophils Absolute: 0 10*3/uL (ref 0.0–0.5)
Eosinophils Relative: 1 %
HCT: 37 % — ABNORMAL LOW (ref 39.0–52.0)
Hemoglobin: 11.4 g/dL — ABNORMAL LOW (ref 13.0–17.0)
Immature Granulocytes: 1 %
Lymphocytes Relative: 14 %
Lymphs Abs: 0.8 10*3/uL (ref 0.7–4.0)
MCH: 27.7 pg (ref 26.0–34.0)
MCHC: 30.8 g/dL (ref 30.0–36.0)
MCV: 90 fL (ref 80.0–100.0)
Monocytes Absolute: 0.7 10*3/uL (ref 0.1–1.0)
Monocytes Relative: 11 %
Neutro Abs: 4.6 10*3/uL (ref 1.7–7.7)
Neutrophils Relative %: 73 %
Platelets: 268 10*3/uL (ref 150–400)
RBC: 4.11 MIL/uL — ABNORMAL LOW (ref 4.22–5.81)
RDW: 12.8 % (ref 11.5–15.5)
WBC: 6.2 10*3/uL (ref 4.0–10.5)
nRBC: 0 % (ref 0.0–0.2)

## 2020-02-04 LAB — GLUCOSE, CAPILLARY: Glucose-Capillary: 125 mg/dL — ABNORMAL HIGH (ref 70–99)

## 2020-02-04 IMAGING — CT CT HEAD W/O CM
4 series · 17 of 47 positions shown, 19 images · non-contrast
Comparison: None.

CLINICAL DATA: Acute loss of vision right eye. History of lung
carcinoma

EXAM:
CT HEAD WITHOUT CONTRAST
TECHNIQUE: Contiguous axial images were obtained from the base of the skull
through the vertex without intravenous contrast.

[Series 3: head without · axial · non-contrast · 0.46mm/px · z∈[-47,+73]mm · 7 of 33 slices shown, 9 images]
[im 5/33  brain]
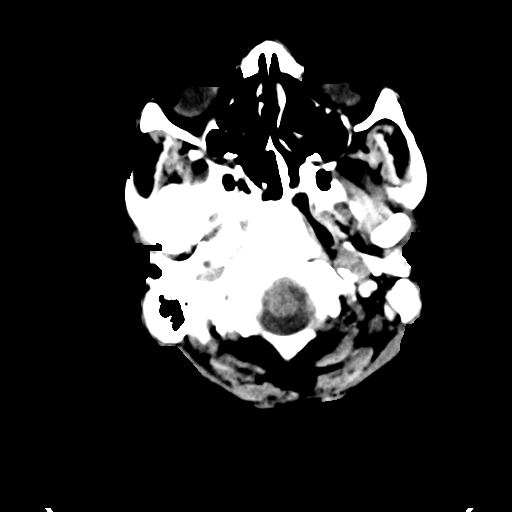
[im 5/33  bone]
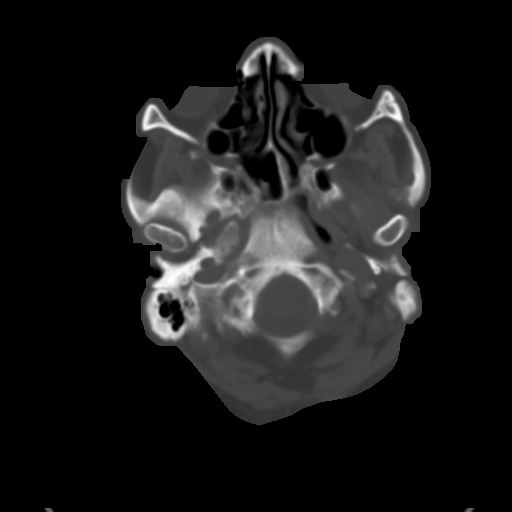
[im 9/33  brain]
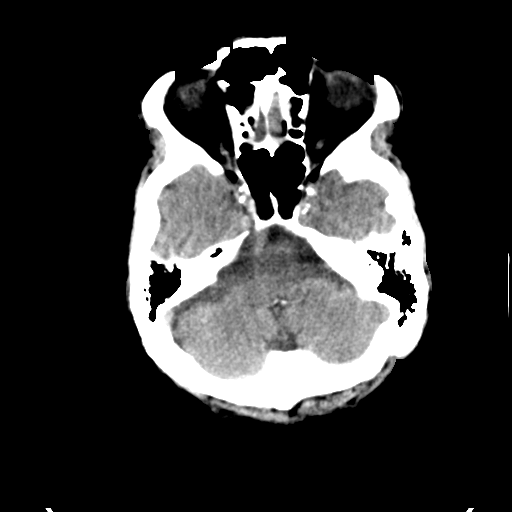
[im 13/33  brain]
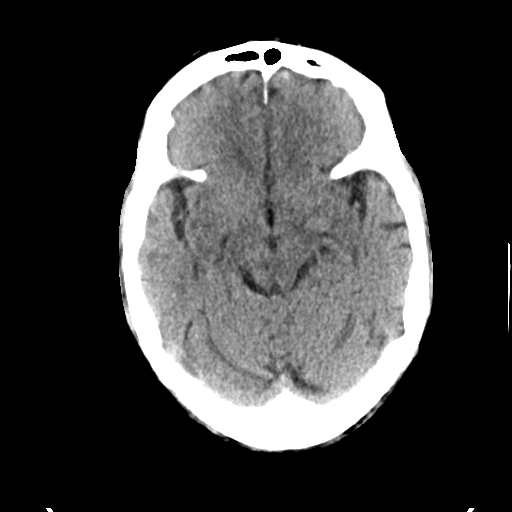
[im 17/33  brain]
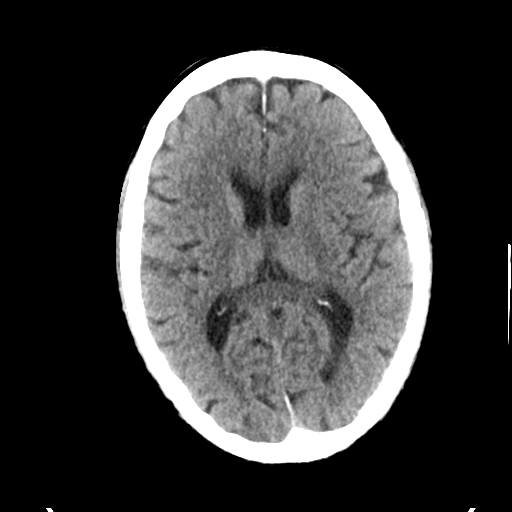
[im 21/33  brain]
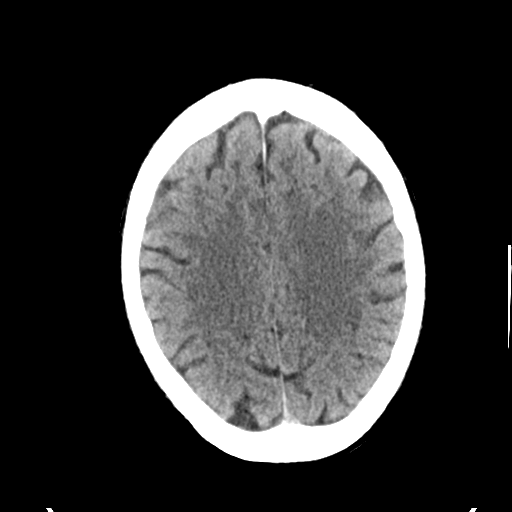
[im 21/33  bone]
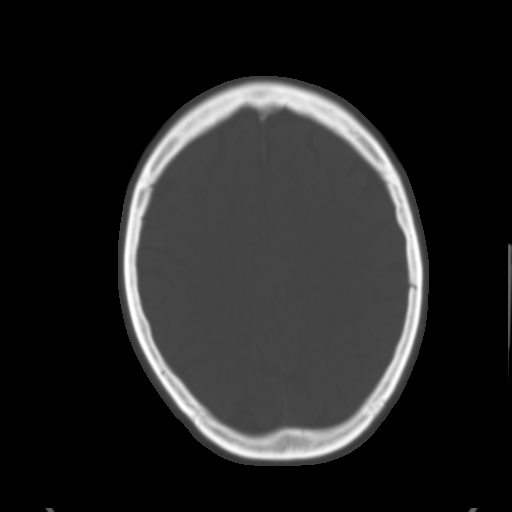
[im 25/33  brain]
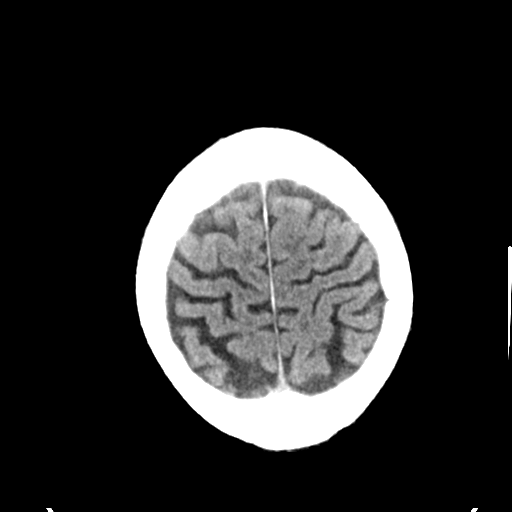
[im 29/33  brain]
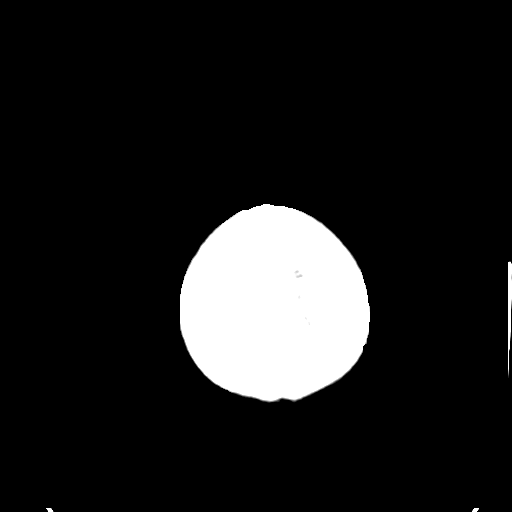

[Series 4: head bone · axial · 0.46mm/px · z∈[-51,+5]mm · 4 of 82 slices shown]
[im 9/82  bone]
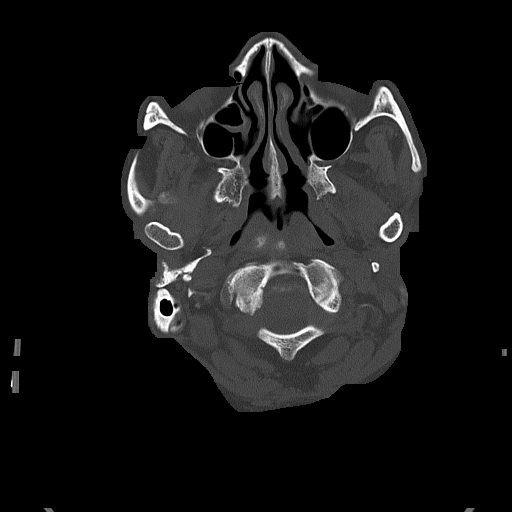
[im 17/82  bone]
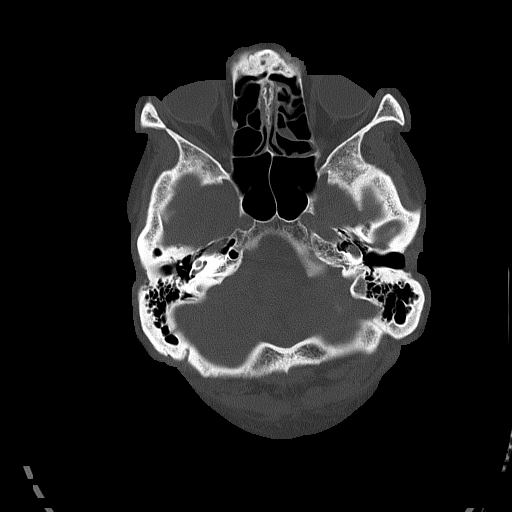
[im 25/82  bone]
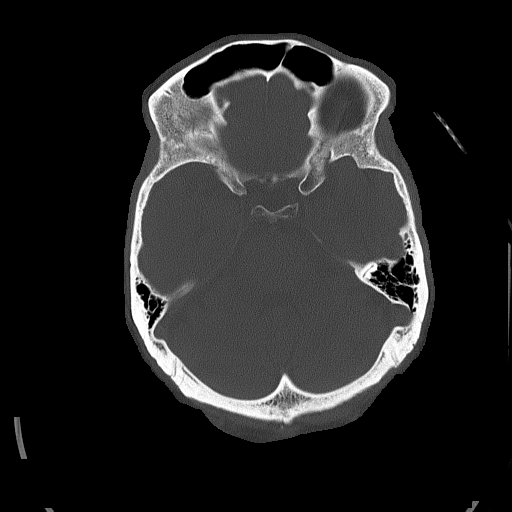
[im 37/82  bone]
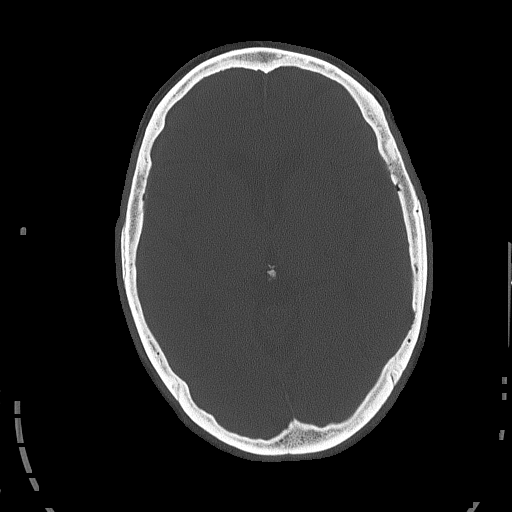

[Series 5: head without cor · coronal · non-contrast · 0.35mm/px · 3 of 74 slices shown]
[im 25/74  brain]
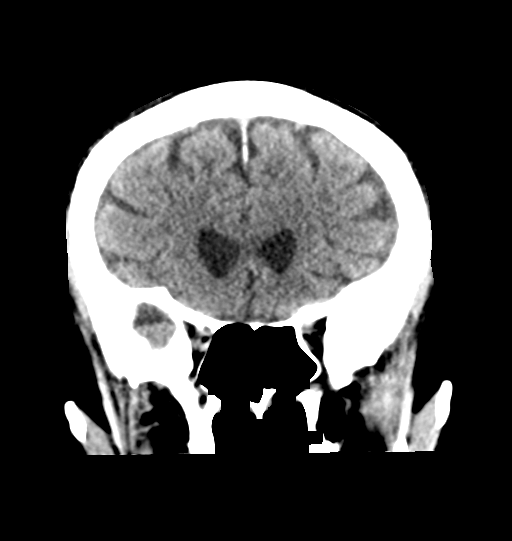
[im 33/74  brain]
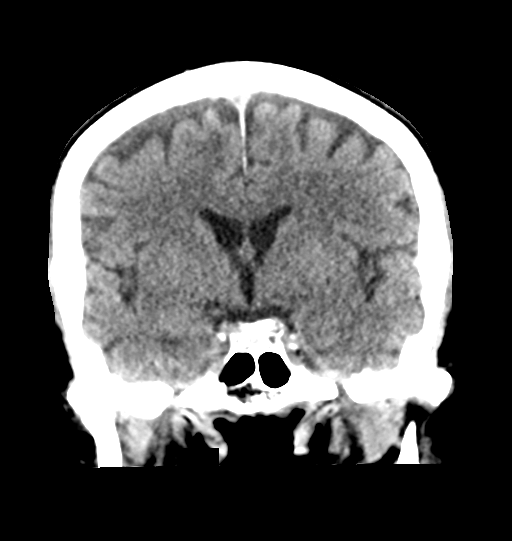
[im 41/74  brain]
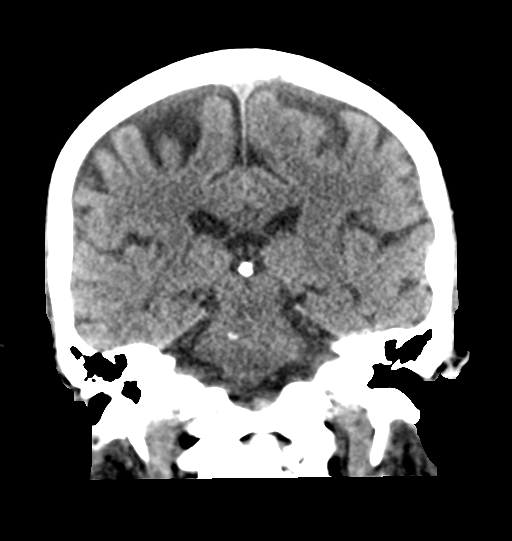

[Series 6: head without sag · sagittal · non-contrast · 0.40mm/px · 3 of 64 slices shown]
[im 22/64  brain]
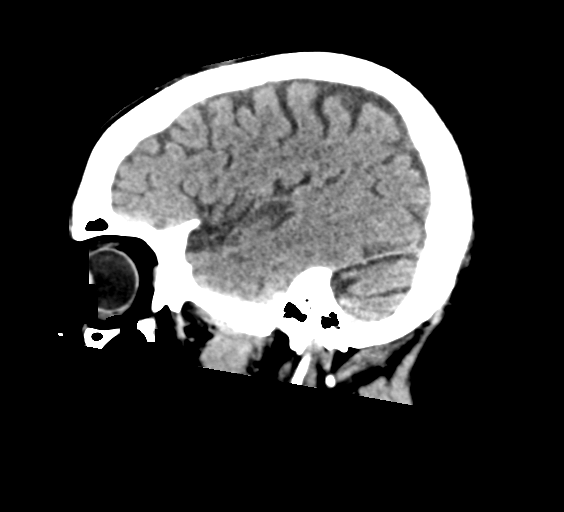
[im 32/64  brain]
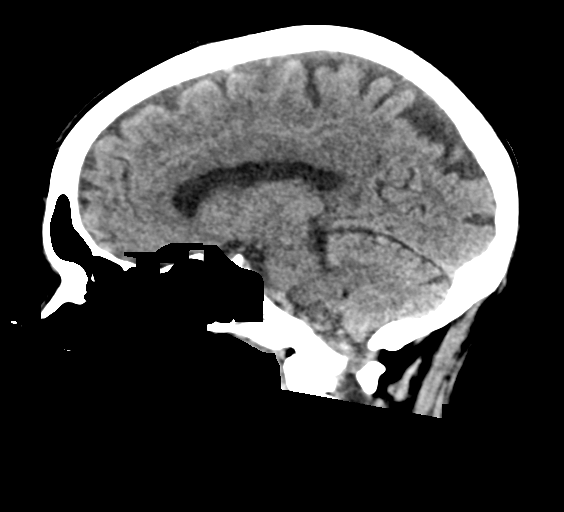
[im 42/64  brain]
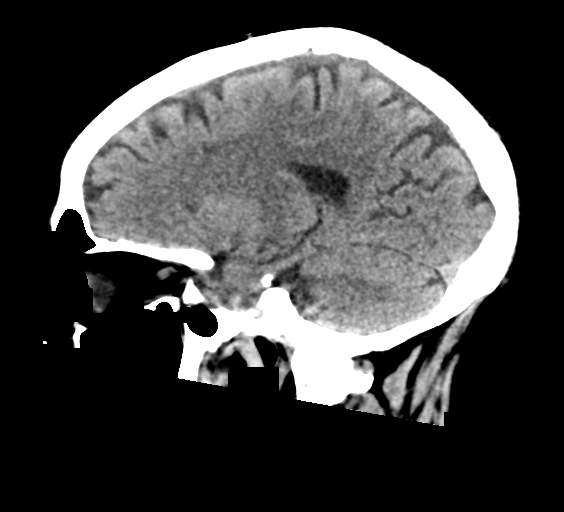

[17 of 47 positions shown; findings below may reference images not displayed]

FINDINGS: Brain: There is mild age related volume loss. There is no
intracranial mass, hemorrhage, extra-axial fluid collection, or
midline shift. Decreased attenuation is noted in the anterior limb
of each external capsule, likely due to prior lacunar type infarcts
in these areas. No acute appearing infarct is evident.

Vascular: No hyperdense vessel. There is calcification in each
carotid siphon region.

Skull: Bony calvarium appears intact.

Sinuses/Orbits: There is mucosal thickening in several ethmoid air
cells. Other visualized paranasal sinuses are clear. Orbits appear
symmetric bilaterally.

Other: Mastoid air cells are clear.
IMPRESSION: Prior small lacunar infarcts in the anterior limb of each external
capsule. No acute appearing infarct is evident. No mass or
hemorrhage.

There are foci of arterial vascular calcification. There is mucosal
thickening in several ethmoid air cells.

## 2020-02-04 IMAGING — CT CT ABDOMEN W/O CM
2 of 4 series · 14 of 46 positions shown, 16 images · non-contrast
Comparison: Chest CT-[DATE]

CLINICAL DATA: Evaluate gastric anatomy prior to potential
percutaneous gastrostomy placement.

EXAM:
CT ABDOMEN WITHOUT CONTRAST
TECHNIQUE: Multidetector CT imaging of the abdomen was performed following the
standard protocol without IV contrast.

[Series 3: abd/ pelvis 5.0 i30f 2 · axial · 0.90mm/px · z∈[+446,+646]mm · 11 of 48 slices shown, 13 images]
[im 5/48  soft-tissue]
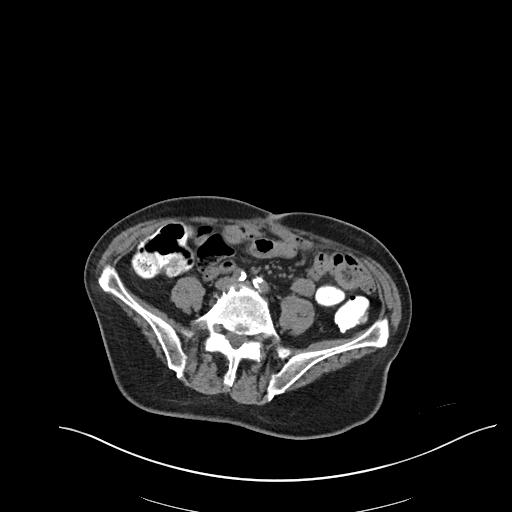
[im 5/48  bone]
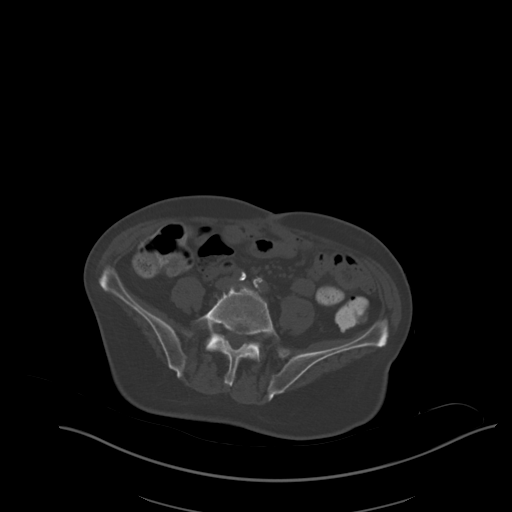
[im 9/48  soft-tissue]
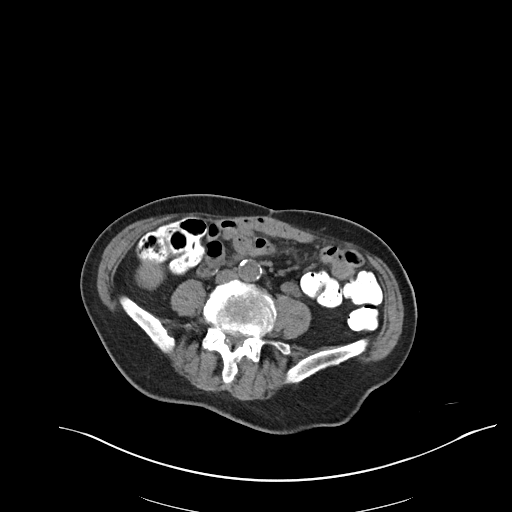
[im 13/48  soft-tissue]
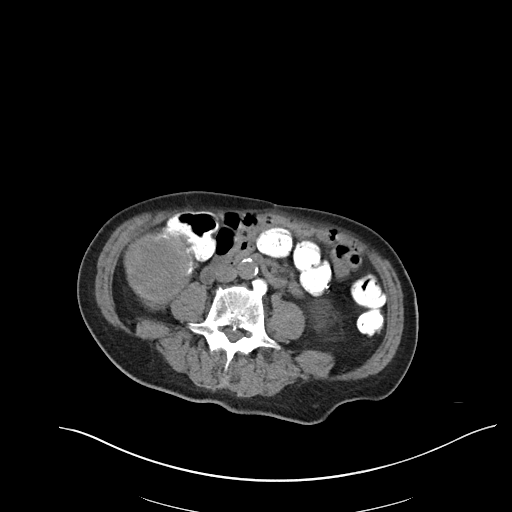
[im 17/48  soft-tissue]
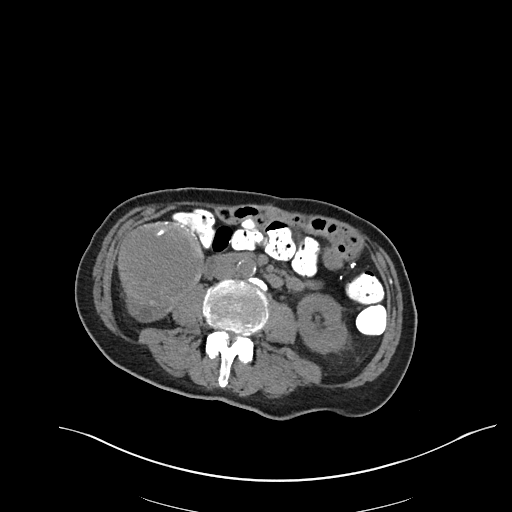
[im 21/48  soft-tissue]
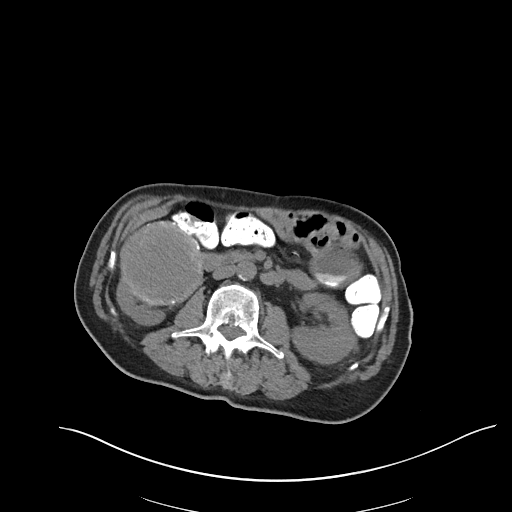
[im 25/48  soft-tissue]
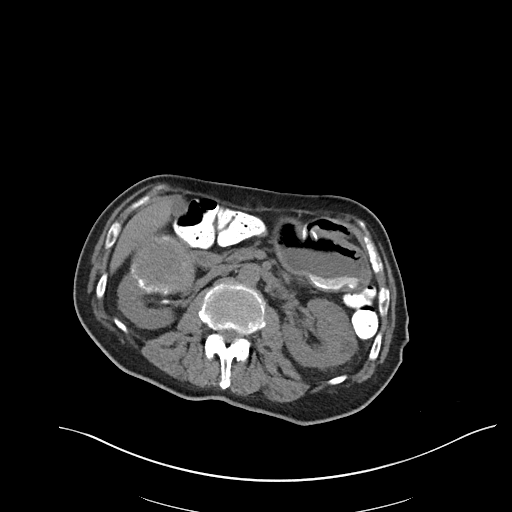
[im 29/48  soft-tissue]
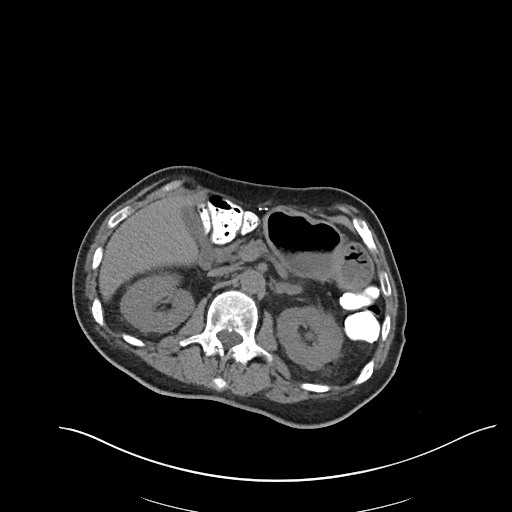
[im 33/48  soft-tissue]
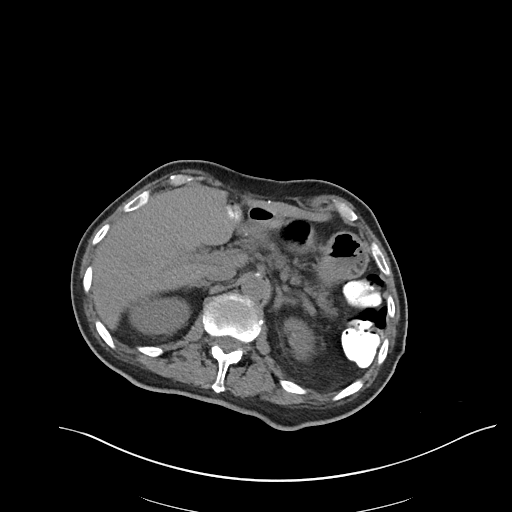
[im 37/48  soft-tissue]
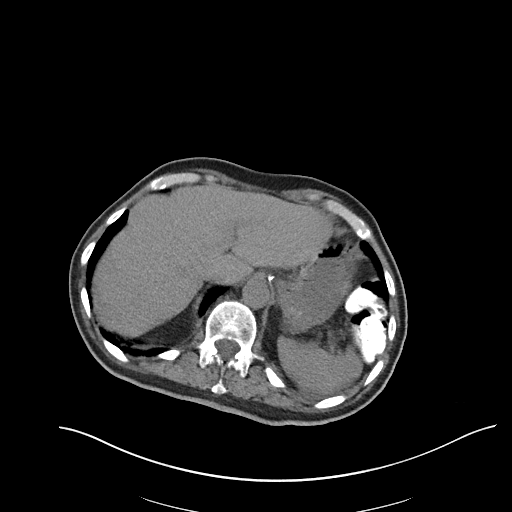
[im 37/48  bone]
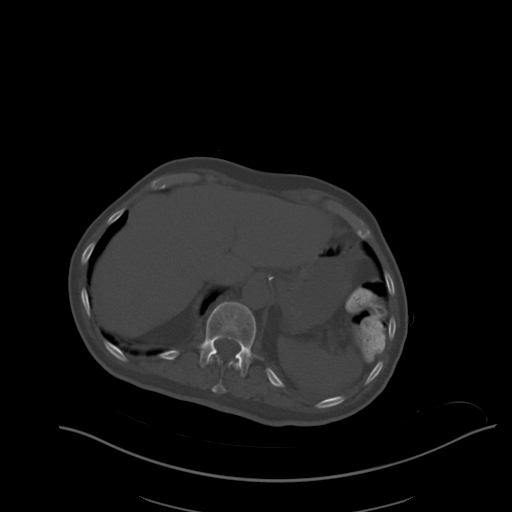
[im 41/48  soft-tissue]
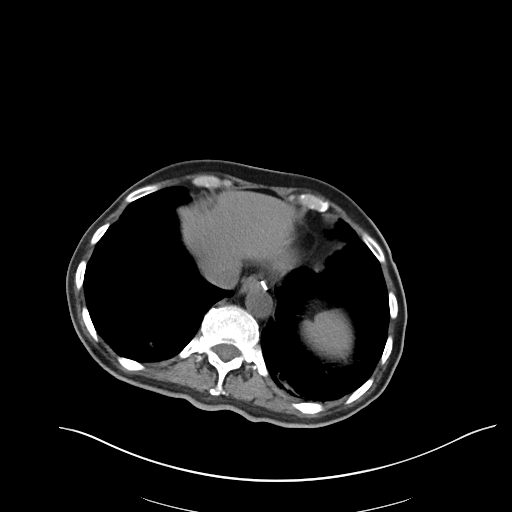
[im 45/48  soft-tissue]
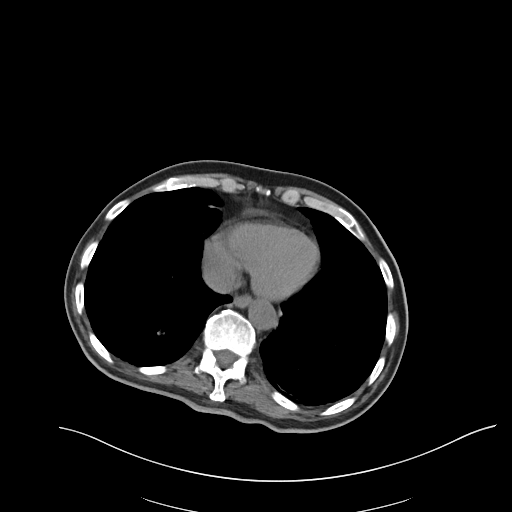

[Series 6: cor st · coronal · 0.51mm/px · 3 of 95 slices shown]
[im 32/95  soft-tissue]
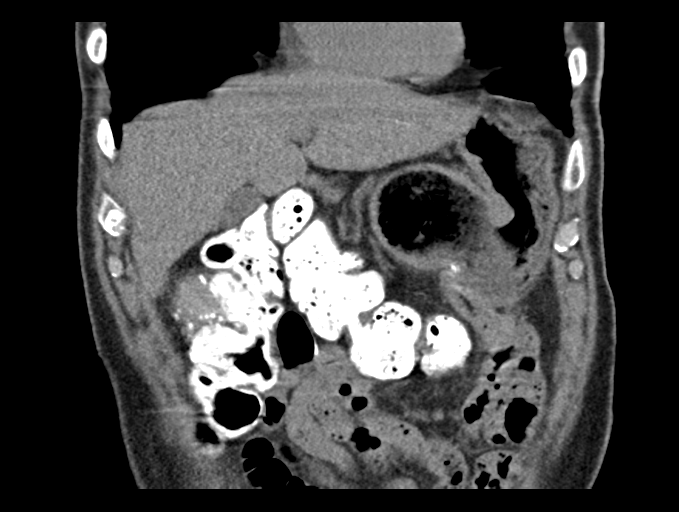
[im 42/95  soft-tissue]
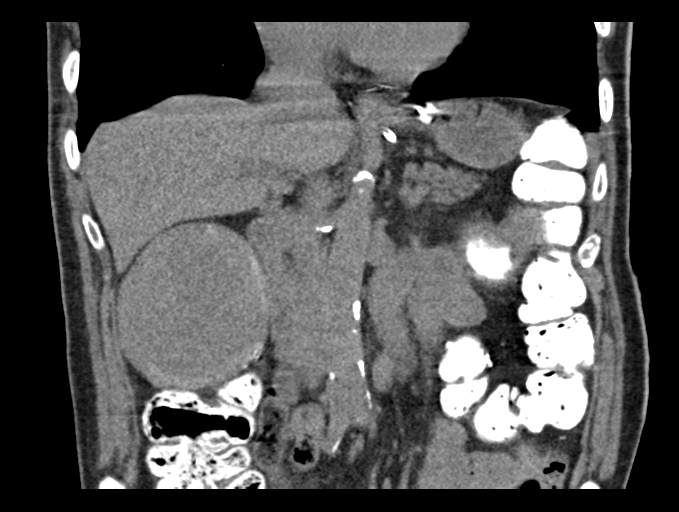
[im 53/95  soft-tissue]
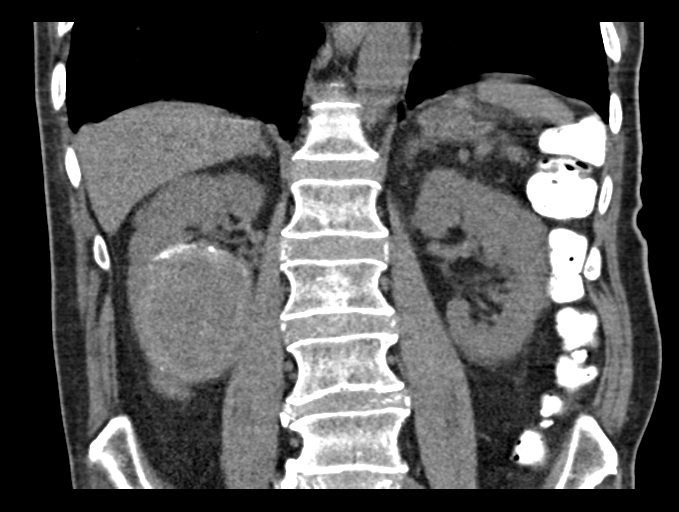

[14 of 46 positions shown; findings below may reference images not displayed]

FINDINGS: The lack of intravenous contrast limits the ability to evaluate
solid abdominal organs.

Lower chest: Limited visualization of the lower thorax demonstrates
minimal dependent subpleural ground-glass atelectasis. No discrete
focal airspace opacities. No pleural effusion

Normal heart size. No pericardial effusion. There is mild decreased
attenuation of the intra cardiac blood pool suggestive of anemia.

Hepatobiliary: Normal hepatic contour. Normal noncontrast appearance
of the gallbladder given degree of distention. No radiopaque
gallstones. No ascites.

Pancreas: Normal noncontrast appearance of the pancreas.

Spleen: Normal noncontrast appearance of the spleen.

Adrenals/Urinary Tract: There is a large (approximately 8.5 x 7.9 x
7.6 cm peripherally calcified mixed attenuating mass arising from
the anterior aspect of the interpolar aspect of the right kidney
(axial image 31, series 3; coronal image 50, series 6). Additional
punctate (sub 1.1 cm) hypoattenuating renal lesions are seen
bilaterally, incompletely evaluated though favored to represent
renal cysts. Note is made of an approximately 0.8 x 0.6 cm
nonobstructing stone within the right renal pelvis (axial image 25,
series 3; coronal image 62, series 6). There is a punctate
(approximately 2 mm) nonobstructing stone within the anterior
interpolar aspect the left kidney (axial image 25, series 3). No
definitive renal stones are seen along the imaged course of the
superior aspect of the bilateral ureters. Very minimal amount of
symmetric bilateral perinephric stranding. No evidence of urinary
obstruction.

There is mild thickening of the lateral limb of the left adrenal
gland without discrete nodule. Normal noncontrast appearance of the
right adrenal gland.

The urinary bladder was not imaged.

Stomach/Bowel: Postoperative change of the GE junction as well as
the anterior aspect of the gastric antrum potentially the sequela of
previous gastrojejunostomy creation though a distal anastomosis was
not imaged. Ingested enteric contrast is seen within the colon. No
evidence of enteric obstruction. The anterior wall of the gastric
antrum is otherwise well apposed against the ventral wall of the
upper abdomen without interposition either the colon or liver (axial
image 20, series 3).

No pneumoperitoneum, pneumatosis or portal venous gas.

Vascular/Lymphatic: Atherosclerotic plaque within a normal caliber
abdominal aorta.

No bulky retroperitoneal or mesenteric lymphadenopathy on this
noncontrast examination.

Other: Dermal calcification overlies the ventral aspect of the upper
abdomen. Regional soft tissues appear otherwise normal.

Musculoskeletal: No acute or aggressive osseous abnormalities.
Mild-to-moderate multilevel lumbar spine DDD, worse at L3-L4 and
L4-L5 with disc space height loss, endplate irregularity and small
posteriorly directed disc osteophyte complexes at these locations.
IMPRESSION: 1. Postoperative change of the GE junction as well as the anterior
aspect of the gastric antrum which may represent the sequela of
previous gastrojejunostomy. Correlation with previous operative
reports is advised as if the patient is post gastrojejunostomy
creation he would NOT be a candidate for percutaneous gastrostomy
tube placement. If operative reports are not available, further
evaluation with upper GI series could be performed as indicated.
2. Gastric anatomy otherwise amenable to percutaneous gastrostomy
tube placement as indicated.
3. Incidentally noted approximately 8.5 cm mixed attenuating mass
arising from the anterior aspect of the interpolar aspect of the
right kidney - while potentially representative of a complex renal
cyst, further evaluation with contrast-enhanced abdominal MRI is
advised.
4. Nonobstructing bilateral nephrolithiasis.
5. Aortic Atherosclerosis ([Q3]-[Q3]).

## 2020-02-04 MED ORDER — ASPIRIN 81 MG PO CHEW
81.0000 mg | CHEWABLE_TABLET | Freq: Every day | ORAL | Status: DC
  Administered 2020-02-05 – 2020-02-12 (×6): 81 mg via ORAL
  Filled 2020-02-04 (×6): qty 1

## 2020-02-04 MED ORDER — DEXTROSE-NACL 5-0.9 % IV SOLN: INTRAVENOUS | Status: AC

## 2020-02-04 NOTE — Consult Note (Addendum)
[place]  [phone] [phone]   MEDICAL ONCOLOGY - INITIAL CONSULTATION  Referral MD: Dr. Courtney Heys  Reason for Referral: Non-small cell lung cancer  HPI: Gregory Bautista is a 80 year old male with a past medical history significant for asthma, glaucoma, allergic rhinitis.  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  Procedure was performed on 12/30/2019.  His postop course was complicated by issues with mobility, urinary retention, and UTI.  The patient was discharged to inpatient rehab on 01/12/2020.  We have had him scheduled at the cancer center for an initial visit with medical oncology, but he has been in CIR and has been unable to make an appointment.  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  He was seen by neurosurgery who recommended thoracic laminectomy and he underwent this procedure on 12/30/2019.  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  PD-L1 was 1%.  Molecular studies showed no actionable mutations.  The patient is undergoing a course of palliative radiation for pain control.  The patient just returned from radiation at the time of visit.  He reports that he has a poor appetite and has lost weight recently.  He tells me that he also has had headaches and dizziness.  The patient lost vision in his right eye earlier today.  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   Vision somewhat improved at the time my visit, but not yet back to baseline.  He reports ongoing left-sided chest discomfort, shortness of breath, and cough.  Denies hemoptysis.  Denies abdominal pain but reports nausea but no vomiting.  Denies diarrhea constipation.  Denies bleeding.  Discussed with nursing and they report that the patient minimally participates with rehab.  He is unable to ambulate by himself.  They report the patient will likely discharge to SNF following inpatient rehab.  Current discharge date estimated to be 02/15/2020.  The patient states that he is divorced.  He has 4 children that do not live locally.  Denies history of alcohol use.  He previously smoked half pack of cigarettes x60 years and quit about 3 to 4 months ago.  Family history significant for a mother with cancer.  He is unsure what kind of cancer she had. Medical oncology was asked to the patient make recommendations regarding his metastatic non-small cell lung cancer.   Past Medical History:  Diagnosis Date  . Allergic rhinitis    NEC  . Asthma   . Glaucoma   . Lung cancer, main bronchus ([place]) 12/2019  . Tobacco use disorder 09/24/2019  :  Past Surgical History:  Procedure Laterality Date  . COLON SURGERY    . EYE SURGERY Right   . LAMINECTOMY WITH POSTERIOR LATERAL ARTHRODESIS LEVEL 4 N/A 12/30/2019   Procedure: THORACIC THREE LAMINECTOMY, THORACIC INSTRUMENTATION AND FUSION THORACIC ONE-FIVE;  Surgeon: Newman Pies, MD;  Location: [facility];  Service: Neurosurgery;  Laterality: N/A;  :  Current Facility-Administered Medications  Medication Dose Route Frequency Provider  Last Rate Last Admin  . acetaminophen (TYLENOL) tablet 325-650 mg  325-650 mg Oral Q4H PRN Bary Leriche, PA-C   650 mg at 01/29/20 2209  . albuterol (VENTOLIN HFA) 108 (90 Base) MCG/ACT inhaler 2 puff  2 puff Inhalation Q6H PRN Love, Pamela S, PA-C      . alum & mag hydroxide-simeth (MAALOX/MYLANTA) 200-200-20 MG/5ML suspension 30 mL   30 mL Oral Q4H PRN Love, Pamela S, PA-C      . aspirin chewable tablet 81 mg  81 mg Oral Daily Lovorn, Megan, MD      . bisacodyl (DULCOLAX) suppository 10 mg  10 mg Rectal Daily PRN Love, Pamela S, PA-C      . bisacodyl (DULCOLAX) suppository 10 mg  10 mg Rectal QPC supper Reesa Chew S, PA-C   10 mg at 02/02/20 1842  . Chlorhexidine Gluconate Cloth 2 % PADS 6 each  6 each Topical Daily Lovorn, Jinny Blossom, MD   6 each at 01/26/20 0848  . citalopram (CELEXA) tablet 10 mg  10 mg Oral Daily Lovorn, Megan, MD   10 mg at 02/04/20 0859  . cyclobenzaprine (FLEXERIL) tablet 10 mg  10 mg Oral TID PRN Bary Leriche, PA-C   10 mg at 01/25/20 1252  . dextrose 5 % and 0.45 % NaCl with KCl 10 mEq/L infusion   Intravenous Continuous Courtney Heys, MD   Stopped at 01/20/20 0322  . dextrose 5 %-0.9 % sodium chloride infusion   Intravenous Continuous Lovorn, Megan, MD      . diphenhydrAMINE (BENADRYL) 12.5 MG/5ML elixir 12.5-25 mg  12.5-25 mg Oral Q6H PRN Love, Pamela S, PA-C      . docusate sodium (COLACE) capsule 100 mg  100 mg Oral TID Izora Ribas, MD   100 mg at 02/02/20 1347  . DULoxetine (CYMBALTA) DR capsule 60 mg  60 mg Oral QHS Lovorn, Megan, MD   60 mg at 02/01/20 2146  . feeding supplement (BOOST / RESOURCE BREEZE) liquid 1 Container  1 Container Oral BID WC Lovorn, Megan, MD      . feeding supplement (ENSURE ENLIVE / ENSURE PLUS) liquid 237 mL  237 mL Oral TID BM Lovorn, Megan, MD   237 mL at 02/03/20 1212  . fludrocortisone (FLORINEF) tablet 0.2 mg  0.2 mg Oral Daily Lovorn, Megan, MD   0.2 mg at 02/04/20 0859  . guaiFENesin-dextromethorphan (ROBITUSSIN DM) 100-10 MG/5ML syrup 5-10 mL  5-10 mL Oral Q6H PRN Love, Pamela S, PA-C      . heparin injection 5,000 Units  5,000 Units Subcutaneous Q8H Love, Pamela S, PA-C   5,000 Units at 02/02/20 1346  . hydrocerin (EUCERIN) cream   Topical BID Courtney Heys, MD   Given at 02/04/20 0912  . HYDROcodone-acetaminophen (NORCO/VICODIN) 5-325 MG per tablet 1  tablet  1 tablet Oral Q4H PRN Courtney Heys, MD   1 tablet at 01/31/20 0505  . ipratropium (ATROVENT) 0.06 % nasal spray 2 spray  2 spray Each Nare TID Bary Leriche, PA-C   2 spray at 02/02/20 1347  . latanoprost (XALATAN) 0.005 % ophthalmic solution 1 drop  1 drop Both Eyes QHS Bary Leriche, PA-C   1 drop at 02/01/20 2151  . lidocaine (LIDODERM) 5 % 3 patch  3 patch Transdermal Daily Lovorn, Megan, MD   3 patch at 02/04/20 0859  . lidocaine (XYLOCAINE) 2 % jelly 1 application  1 application Other Daily Lovorn, Megan, MD   1 application at 73/71/06 1842  .  melatonin tablet 3 mg  3 mg Oral QHS Love, Pamela S, PA-C   3 mg at 02/01/20 2147  . menthol-cetylpyridinium (CEPACOL) lozenge 3 mg  1 lozenge Oral PRN Love, Pamela S, PA-C       Or  . phenol (CHLORASEPTIC) mouth spray 1 spray  1 spray Mouth/Throat PRN Love, Pamela S, PA-C      . midodrine (PROAMATINE) tablet 10 mg  10 mg Oral TID WC Lovorn, Megan, MD   10 mg at 02/04/20 0859  . mometasone-formoterol (DULERA) 200-5 MCG/ACT inhaler 2 puff  2 puff Inhalation BID Bary Leriche, PA-C   2 puff at 02/04/20 0751  . multivitamin with minerals tablet 1 tablet  1 tablet Oral Daily Bary Leriche, PA-C   1 tablet at 02/02/20 6226  . nutrition supplement (JUVEN) (JUVEN) powder packet 1 packet  1 packet Oral BID BM Bary Leriche, PA-C   1 packet at 02/02/20 1226  . pantoprazole (PROTONIX) EC tablet 40 mg  40 mg Oral Daily Lovorn, Megan, MD   40 mg at 02/04/20 0859  . polyethylene glycol (MIRALAX / GLYCOLAX) packet 17 g  17 g Oral BID Meredith Staggers, MD   17 g at 02/02/20 2025  . prochlorperazine (COMPAZINE) tablet 5-10 mg  5-10 mg Oral Q6H PRN Love, Pamela S, PA-C       Or  . prochlorperazine (COMPAZINE) injection 5-10 mg  5-10 mg Intramuscular Q6H PRN Love, Pamela S, PA-C       Or  . prochlorperazine (COMPAZINE) suppository 12.5 mg  12.5 mg Rectal Q6H PRN Love, Pamela S, PA-C      . psyllium (HYDROCIL/METAMUCIL) 1 packet  1 packet Oral Daily  Meredith Staggers, MD   1 packet at 02/02/20 0830  . senna-docusate (Senokot-S) tablet 2 tablet  2 tablet Oral Q breakfast Bary Leriche, [place]   2 tablet at 02/02/20 3335  . sodium phosphate (FLEET) 7-19 GM/118ML enema 1 enema  1 enema Rectal Once PRN Love, Pamela S, PA-C      . sodium phosphate (FLEET) 7-19 GM/118ML enema 1 enema  1 enema Rectal Once Lovorn, Megan, MD      . tamsulosin (FLOMAX) capsule 0.4 mg  0.4 mg Oral QPC supper Lovorn, Megan, MD   0.4 mg at 02/02/20 1755  . timolol (TIMOPTIC) 0.5 % ophthalmic solution 1 drop  1 drop Both Eyes BID Bary Leriche, PA-C   1 drop at 02/02/20 0837  . traMADol (ULTRAM) tablet 50 mg  50 mg Oral QID PRN Bary Leriche, PA-C   50 mg at 02/02/20 0557  . traZODone (DESYREL) tablet 100 mg  100 mg Oral QHS Lovorn, Megan, MD   100 mg at 02/01/20 2147     Allergies  Allergen Reactions  . Penicillins Anaphylaxis and Other (See Comments)    Pt does not recall this reaction 2021*Tolerated Cephalexin*  Has patient had a PCN reaction causing immediate rash, facial/tongue/throat swelling, SOB or lightheadedness with hypotension: Yes Has patient had a PCN reaction causing severe rash involving mucus membranes or skin necrosis: No Has patient had a PCN reaction that required hospitalization No Has patient had a PCN reaction occurring within the last 10 years: Yes If all of the above answers are "NO", then may proceed with Cephalosporin use  . Wellbutrin [Bupropion] Other (See Comments)    Patient disputes this in 2021 Seizures  . Morphine And Related Itching and Other (See Comments)    Patient disputes this  in 2021  :  Family History  Problem Relation Age of Onset  . Cancer Mother   . Heart attack Father   . Cancer Brother   :  Social History   Socioeconomic History  . Marital status: Widowed    Spouse name: Not on file  . Number of children: Not on file  . Years of education: Not on file  . Highest education level: Not on file   Occupational History  . Not on file  Tobacco Use  . Smoking status: Former Smoker    Packs/day: 0.50    Years: 60.00    Pack years: 30.00    Types: Cigarettes    Start date: 12/24/1955    Quit date: 09/24/2019    Years since quitting: 0.3  . Smokeless tobacco: Never Used  Substance and Sexual Activity  . Alcohol use: No  . Drug use: No  . Sexual activity: Not on file  Other Topics Concern  . Not on file  Social History Narrative  . Not on file   Social Determinants of Health   Financial Resource Strain: Not on file  Food Insecurity: Not on file  Transportation Needs: Not on file  Physical Activity: Not on file  Stress: Not on file  Social Connections: Not on file  Intimate Partner Violence: Not on file  :  Review of Systems: A comprehensive 14 point review of systems was negative except as noted in the HPI.  Exam: Patient Vitals for the past 24 hrs:  BP Temp Temp src Pulse Resp SpO2  02/04/20 0914 (!) 117/105 97.7 F (36.5 C) Oral (!) 127 16 (!) 85 %  02/04/20 0751 - - - (!) 123 16 94 %  02/04/20 0704 (!) 100/54 98.6 F (37 C) Oral (!) 122 18 99 %  02/04/20 0501 118/78 99 F (37.2 C) - (!) 118 18 100 %  02/03/20 2148 127/79 98.9 F (37.2 C) Oral 99 14 (!) 89 %    General: Chronically ill-appearing male, no distress Eyes:  no scleral icterus.   ENT:  There were no oropharyngeal lesions.    Lymphatics:  Negative cervical, supraclavicular or axillary adenopathy.   Respiratory: lungs were clear bilaterally without wheezing or crackles.   Cardiovascular:  Regular rate and rhythm, S1/S2, without murmur, rub or gallop.  There was no pedal edema.   GI:  abdomen was soft, flat, nontender, nondistended, without organomegaly.   Musculoskeletal: Strength symmetrical in the upper and lower extremities. Skin exam was without echymosis, petichae.   Neuro exam was nonfocal. Patient was alert and oriented.  Attention was good.   Language was appropriate.  Mood was normal  without depression.  Speech was not pressured.  Thought content was not tangential.     Lab Results  Component Value Date   WBC 6.2 02/04/2020   HGB 11.4 (L) 02/04/2020   HCT 37.0 (L) 02/04/2020   PLT 268 02/04/2020   GLUCOSE 119 (H) 02/04/2020   ALT 27 02/04/2020   AST 22 02/04/2020   NA 140 02/04/2020   K 3.8 02/04/2020   CL 100 02/04/2020   CREATININE 0.80 02/04/2020   BUN 21 02/04/2020   CO2 27 02/04/2020    DG Abd 1 View  Result Date: 01/12/2020 CLINICAL DATA:  Abdominal pain, constipation EXAM: ABDOMEN - 1 VIEW COMPARISON:  None. FINDINGS: Supine frontal view of the abdomen and pelvis excludes the pubic symphysis by collimation. No bowel obstruction or ileus. Significant retained stool throughout the colon consistent with  constipation. Multiple surgical clips throughout the upper abdomen. Curvilinear calcification right mid abdomen of uncertain etiology, but could be associated with the right kidney. No acute bony abnormalities. IMPRESSION: 1. Significant fecal retention compatible with constipation. 2. Curvilinear calcification right mid abdomen, of uncertain etiology. This could be related to the right kidney. If further evaluation is desired, ultrasound or CT could be performed. Electronically Signed   By: Randa Ngo M.D.   On: 01/12/2020 17:17   CT HEAD WO CONTRAST  Result Date: 02/04/2020 CLINICAL DATA:  Acute loss of vision right eye. History of lung carcinoma EXAM: CT HEAD WITHOUT CONTRAST TECHNIQUE: Contiguous axial images were obtained from the base of the skull through the vertex without intravenous contrast. COMPARISON:  None. FINDINGS: Brain: There is mild age related volume loss. There is no intracranial mass, hemorrhage, extra-axial fluid collection, or midline shift. Decreased attenuation is noted in the anterior limb of each external capsule, likely due to prior lacunar type infarcts in these areas. No acute appearing infarct is evident. Vascular: No hyperdense  vessel. There is calcification in each carotid siphon region. Skull: Bony calvarium appears intact. Sinuses/Orbits: There is mucosal thickening in several ethmoid air cells. Other visualized paranasal sinuses are clear. Orbits appear symmetric bilaterally. Other: Mastoid air cells are clear. 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 No acute appearing infarct is evident. No mass or hemorrhage. There are foci of arterial vascular calcification. There is mucosal thickening in several ethmoid air cells. Electronically Signed   By: Lowella Grip III M.D.   On: 02/04/2020 12:54   DG Swallowing Func-Speech Pathology  Result Date: 01/29/2020 Objective Swallowing Evaluation: Type of Study: MBS-Modified Barium Swallow Study  Patient Details Name: Jaylen Knope MRN: [id number] Date of Birth: 1940-02-05 Today's Date: 01/29/2020 Time: No data recorded-No data recorded No data recorded Past Medical History: Past Medical History: Diagnosis Date . Allergic rhinitis   NEC . Asthma  . Glaucoma  . Lung cancer, main bronchus ([place]) 12/2019 . Tobacco use disorder 09/24/2019 Past Surgical History: Past Surgical History: Procedure Laterality Date . COLON SURGERY   . EYE SURGERY Right  . LAMINECTOMY WITH POSTERIOR LATERAL ARTHRODESIS LEVEL 4 N/A 12/30/2019  Procedure: THORACIC THREE LAMINECTOMY, THORACIC INSTRUMENTATION AND FUSION THORACIC ONE-FIVE;  Surgeon: Newman Pies, MD;  Location: [facility];  Service: Neurosurgery;  Laterality: N/A; HPI: see H&P  No data recorded Assessment / Plan / Recommendation CHL IP CLINICAL IMPRESSIONS 01/29/2020 Clinical Impression Patient presents with a moderate oral and mild pharyngeal dysphagia. Oral phase of swallow consists of delayed anterior to posterior transit of all boluses (heavier boluses leading to longer delays), decreased bolus cohesion and piecemeal swallowing with mechanical soft solids, decreased mastication of mechanical soft solids.  Pharyngeal phase consisted of minimal instances of delays of swallow initiation to vallecular sinus with thin liquids, mild amount of pyriform residuals and trace to mild vallecular sinus residuals with thin liquids,mechanical soft solids. 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 No penetration or aspiration events observed. SLP Visit Diagnosis Dysphagia, oropharyngeal phase (R13.12) Attention and concentration deficit following -- Frontal lobe and executive function deficit following -- Impact on safety and function No limitations   No flowsheet data found.  No flowsheet data found. CHL IP DIET RECOMMENDATION 01/29/2020 SLP Diet Recommendations Dysphagia 1 (Puree) solids;Thin liquid Liquid Administration via Cup;Straw Medication Administration Whole meds with puree Compensations Minimize environmental distractions;Slow rate;Small sips/bites;Follow solids with liquid Postural Changes Remain  semi-upright after after feeds/meals (Comment)   CHL IP OTHER RECOMMENDATIONS 01/29/2020 Recommended Consults -- Oral Care Recommendations Oral care BID Other Recommendations --   CHL IP FOLLOW UP RECOMMENDATIONS 01/29/2020 Follow up Recommendations None   No flowsheet data found.     CHL IP ORAL PHASE 01/29/2020 Oral Phase Impaired Oral - Pudding Teaspoon -- Oral - Pudding Cup -- Oral - Honey Teaspoon -- Oral - Honey Cup -- Oral - Nectar Teaspoon -- Oral - Nectar Cup -- Oral - Nectar Straw -- Oral - Thin Teaspoon -- Oral - Thin Cup Reduced posterior propulsion;Delayed oral transit Oral - Thin Straw -- Oral - Puree Piecemeal swallowing;Reduced posterior propulsion;Delayed oral transit Oral - Mech Soft Impaired mastication;Weak lingual manipulation;Piecemeal swallowing;Delayed oral transit;Reduced posterior propulsion;Holding of bolus Oral - Regular -- Oral - Multi-Consistency -- Oral - Pill Decreased bolus cohesion;Reduced posterior propulsion;Weak lingual  manipulation;Delayed oral transit Oral Phase - Comment --  CHL IP PHARYNGEAL PHASE 01/29/2020 Pharyngeal Phase Impaired Pharyngeal- Pudding Teaspoon -- Pharyngeal -- Pharyngeal- Pudding Cup -- Pharyngeal -- Pharyngeal- Honey Teaspoon -- Pharyngeal -- Pharyngeal- Honey Cup -- Pharyngeal -- Pharyngeal- Nectar Teaspoon -- Pharyngeal -- Pharyngeal- Nectar Cup -- Pharyngeal -- Pharyngeal- Nectar Straw -- Pharyngeal -- Pharyngeal- Thin Teaspoon -- Pharyngeal -- Pharyngeal- Thin Cup Delayed swallow initiation-vallecula;Pharyngeal residue - pyriform Pharyngeal -- Pharyngeal- Thin Straw Delayed swallow initiation-vallecula Pharyngeal -- Pharyngeal- Puree WFL Pharyngeal -- Pharyngeal- Mechanical Soft Pharyngeal residue - valleculae Pharyngeal -- Pharyngeal- Regular -- Pharyngeal -- Pharyngeal- Multi-consistency -- Pharyngeal -- Pharyngeal- Pill WFL Pharyngeal -- Pharyngeal Comment --  CHL IP CERVICAL ESOPHAGEAL PHASE 01/29/2020 Cervical Esophageal Phase Impaired Pudding Teaspoon -- Pudding Cup -- Honey Teaspoon -- Honey Cup -- Nectar Teaspoon -- Nectar Cup -- Nectar Straw -- Thin Teaspoon -- Thin Cup -- Thin Straw -- Puree Prominent cricopharyngeal segment Mechanical Soft Prominent cricopharyngeal segment Regular -- Multi-consistency -- Pill -- Cervical Esophageal Comment -- Sonia Baller, MA, CCC-SLP Speech Therapy             VAS [place] LOWER EXTREMITY VENOUS (DVT)  Result Date: 01/12/2020  Lower Venous DVT Study Other Indications: Immobility. Comparison Study: No previous scan Performing Technologist: Vonzell Schlatter RVT  Examination Guidelines: A complete evaluation includes B-mode imaging, spectral Doppler, color Doppler, and power Doppler as needed of all accessible portions of each vessel. Bilateral testing is considered an integral part of a complete examination. Limited examinations for reoccurring indications may be performed as noted. The reflux portion of the exam is performed with the patient in reverse Trendelenburg.   +---------+---------------+---------+-----------+----------+--------------+ RIGHT    CompressibilityPhasicitySpontaneityPropertiesThrombus Aging +---------+---------------+---------+-----------+----------+--------------+ CFV      Full           Yes      Yes                                 +---------+---------------+---------+-----------+----------+--------------+ SFJ      Full                                                        +---------+---------------+---------+-----------+----------+--------------+ FV Prox  Full                                                        +---------+---------------+---------+-----------+----------+--------------+  FV Mid   Full                                                        +---------+---------------+---------+-----------+----------+--------------+ FV DistalFull                                                        +---------+---------------+---------+-----------+----------+--------------+ PFV      Full                                                        +---------+---------------+---------+-----------+----------+--------------+ POP      Full           Yes      Yes                                 +---------+---------------+---------+-----------+----------+--------------+ PTV      Full                                                        +---------+---------------+---------+-----------+----------+--------------+ PERO     Full                                                        +---------+---------------+---------+-----------+----------+--------------+   +---------+---------------+---------+-----------+----------+--------------+ LEFT     CompressibilityPhasicitySpontaneityPropertiesThrombus Aging +---------+---------------+---------+-----------+----------+--------------+ CFV      Full           Yes      Yes                                  +---------+---------------+---------+-----------+----------+--------------+ SFJ      Full                                                        +---------+---------------+---------+-----------+----------+--------------+ FV Prox  Full                                                        +---------+---------------+---------+-----------+----------+--------------+ FV Mid   Full                                                        +---------+---------------+---------+-----------+----------+--------------+  FV DistalFull                                                        +---------+---------------+---------+-----------+----------+--------------+ PFV      Full                                                        +---------+---------------+---------+-----------+----------+--------------+ POP      Full           Yes      Yes                                 +---------+---------------+---------+-----------+----------+--------------+ PTV      Full                                                        +---------+---------------+---------+-----------+----------+--------------+ PERO     Full                                                        +---------+---------------+---------+-----------+----------+--------------+     Summary: RIGHT: - There is no evidence of deep vein thrombosis in the lower extremity.  - No cystic structure found in the popliteal fossa.  LEFT: - There is no evidence of deep vein thrombosis in the lower extremity.  - No cystic structure found in the popliteal fossa.  *See table(s) above for measurements and observations. Electronically signed by Harold Barban MD on 01/12/2020 at 6:05:45 PM.    Final      DG Abd 1 View  Result Date: 01/12/2020 CLINICAL DATA:  Abdominal pain, constipation EXAM: ABDOMEN - 1 VIEW COMPARISON:  None. FINDINGS: Supine frontal view of the abdomen and pelvis excludes the pubic symphysis by collimation. No  bowel obstruction or ileus. Significant retained stool throughout the colon consistent with constipation. Multiple surgical clips throughout the upper abdomen. Curvilinear calcification right mid abdomen of uncertain etiology, but could be associated with the right kidney. No acute bony abnormalities. IMPRESSION: 1. Significant fecal retention compatible with constipation. 2. Curvilinear calcification right mid abdomen, of uncertain etiology. This could be related to the right kidney. If further evaluation is desired, ultrasound or CT could be performed. Electronically Signed   By: Randa Ngo M.D.   On: 01/12/2020 17:17   CT HEAD WO CONTRAST  Result Date: 02/04/2020 CLINICAL DATA:  Acute loss of vision right eye. History of lung carcinoma EXAM: CT HEAD WITHOUT CONTRAST TECHNIQUE: Contiguous axial images were obtained from the base of the skull through the vertex without intravenous contrast. COMPARISON:  None. FINDINGS: Brain: There is mild age related volume loss. There is no intracranial mass, hemorrhage, extra-axial fluid collection, or midline shift. Decreased attenuation is noted in the anterior limb of each external capsule, likely due to prior  lacunar type infarcts in these areas. No acute appearing infarct is evident. Vascular: No hyperdense vessel. There is calcification in each carotid siphon region. Skull: Bony calvarium appears intact. Sinuses/Orbits: There is mucosal thickening in several ethmoid air cells. Other visualized paranasal sinuses are clear. Orbits appear symmetric bilaterally. Other: Mastoid air cells are clear. 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 No acute appearing infarct is evident. No mass or hemorrhage. There are foci of arterial vascular calcification. There is mucosal thickening in several ethmoid air cells. Electronically Signed   By: Lowella Grip III M.D.   On: 02/04/2020 12:54   DG Swallowing Func-Speech  Pathology  Result Date: 01/29/2020 Objective Swallowing Evaluation: Type of Study: MBS-Modified Barium Swallow Study  Patient Details Name: Gregory Bautista MRN: [id number] Date of Birth: 08-29-1940 Today's Date: 01/29/2020 Time: No data recorded-No data recorded No data recorded Past Medical History: Past Medical History: Diagnosis Date . Allergic rhinitis   NEC . Asthma  . Glaucoma  . Lung cancer, main bronchus ([place]) 12/2019 . Tobacco use disorder 09/24/2019 Past Surgical History: Past Surgical History: Procedure Laterality Date . COLON SURGERY   . EYE SURGERY Right  . LAMINECTOMY WITH POSTERIOR LATERAL ARTHRODESIS LEVEL 4 N/A 12/30/2019  Procedure: THORACIC THREE LAMINECTOMY, THORACIC INSTRUMENTATION AND FUSION THORACIC ONE-FIVE;  Surgeon: Newman Pies, MD;  Location: [facility];  Service: Neurosurgery;  Laterality: N/A; HPI: see H&P  No data recorded Assessment / Plan / Recommendation CHL IP CLINICAL IMPRESSIONS 01/29/2020 Clinical Impression Patient presents with a moderate oral and mild pharyngeal dysphagia. Oral phase of swallow consists of delayed anterior to posterior transit of all boluses (heavier boluses leading to longer delays), decreased bolus cohesion and piecemeal swallowing with mechanical soft solids, decreased mastication of mechanical soft solids. Pharyngeal phase consisted of minimal instances of delays of swallow initiation to vallecular sinus with thin liquids, mild amount of pyriform residuals and trace to mild vallecular sinus residuals with thin liquids,mechanical soft solids. 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 No penetration or aspiration events observed. SLP Visit Diagnosis Dysphagia, oropharyngeal phase (R13.12) Attention and concentration deficit following -- Frontal lobe and executive function deficit following -- Impact on safety and function No limitations   No flowsheet data found.  No flowsheet data found. CHL IP DIET  RECOMMENDATION 01/29/2020 SLP Diet Recommendations Dysphagia 1 (Puree) solids;Thin liquid Liquid Administration via Cup;Straw Medication Administration Whole meds with puree Compensations Minimize environmental distractions;Slow rate;Small sips/bites;Follow solids with liquid Postural Changes Remain semi-upright after after feeds/meals (Comment)   CHL IP OTHER RECOMMENDATIONS 01/29/2020 Recommended Consults -- Oral Care Recommendations Oral care BID Other Recommendations --   CHL IP FOLLOW UP RECOMMENDATIONS 01/29/2020 Follow up Recommendations None   No flowsheet data found.     CHL IP ORAL PHASE 01/29/2020 Oral Phase Impaired Oral - Pudding Teaspoon -- Oral - Pudding Cup -- Oral - Honey Teaspoon -- Oral - Honey Cup -- Oral - Nectar Teaspoon -- Oral - Nectar Cup -- Oral - Nectar Straw -- Oral - Thin Teaspoon -- Oral - Thin Cup Reduced posterior propulsion;Delayed oral transit Oral - Thin Straw -- Oral - Puree Piecemeal swallowing;Reduced posterior propulsion;Delayed oral transit Oral - Mech Soft Impaired mastication;Weak lingual manipulation;Piecemeal swallowing;Delayed oral transit;Reduced posterior propulsion;Holding of bolus Oral - Regular -- Oral - Multi-Consistency -- Oral - Pill Decreased bolus cohesion;Reduced posterior propulsion;Weak lingual manipulation;Delayed oral transit Oral Phase - Comment --  CHL IP PHARYNGEAL PHASE 01/29/2020 Pharyngeal  Phase Impaired Pharyngeal- Pudding Teaspoon -- Pharyngeal -- Pharyngeal- Pudding Cup -- Pharyngeal -- Pharyngeal- Honey Teaspoon -- Pharyngeal -- Pharyngeal- Honey Cup -- Pharyngeal -- Pharyngeal- Nectar Teaspoon -- Pharyngeal -- Pharyngeal- Nectar Cup -- Pharyngeal -- Pharyngeal- Nectar Straw -- Pharyngeal -- Pharyngeal- Thin Teaspoon -- Pharyngeal -- Pharyngeal- Thin Cup Delayed swallow initiation-vallecula;Pharyngeal residue - pyriform Pharyngeal -- Pharyngeal- Thin Straw Delayed swallow initiation-vallecula Pharyngeal -- Pharyngeal- Puree WFL Pharyngeal -- Pharyngeal-  Mechanical Soft Pharyngeal residue - valleculae Pharyngeal -- Pharyngeal- Regular -- Pharyngeal -- Pharyngeal- Multi-consistency -- Pharyngeal -- Pharyngeal- Pill WFL Pharyngeal -- Pharyngeal Comment --  CHL IP CERVICAL ESOPHAGEAL PHASE 01/29/2020 Cervical Esophageal Phase Impaired Pudding Teaspoon -- Pudding Cup -- Honey Teaspoon -- Honey Cup -- Nectar Teaspoon -- Nectar Cup -- Nectar Straw -- Thin Teaspoon -- Thin Cup -- Thin Straw -- Puree Prominent cricopharyngeal segment Mechanical Soft Prominent cricopharyngeal segment Regular -- Multi-consistency -- Pill -- Cervical Esophageal Comment -- Sonia Baller, MA, CCC-SLP Speech Therapy             VAS [place] LOWER EXTREMITY VENOUS (DVT)  Result Date: 01/12/2020  Lower Venous DVT Study Other Indications: Immobility. Comparison Study: No previous scan Performing Technologist: Vonzell Schlatter RVT  Examination Guidelines: A complete evaluation includes B-mode imaging, spectral Doppler, color Doppler, and power Doppler as needed of all accessible portions of each vessel. Bilateral testing is considered an integral part of a complete examination. Limited examinations for reoccurring indications may be performed as noted. The reflux portion of the exam is performed with the patient in reverse Trendelenburg.  +---------+---------------+---------+-----------+----------+--------------+ RIGHT    CompressibilityPhasicitySpontaneityPropertiesThrombus Aging +---------+---------------+---------+-----------+----------+--------------+ CFV      Full           Yes      Yes                                 +---------+---------------+---------+-----------+----------+--------------+ SFJ      Full                                                        +---------+---------------+---------+-----------+----------+--------------+ FV Prox  Full                                                         +---------+---------------+---------+-----------+----------+--------------+ FV Mid   Full                                                        +---------+---------------+---------+-----------+----------+--------------+ FV DistalFull                                                        +---------+---------------+---------+-----------+----------+--------------+ PFV      Full                                                        +---------+---------------+---------+-----------+----------+--------------+  POP      Full           Yes      Yes                                 +---------+---------------+---------+-----------+----------+--------------+ PTV      Full                                                        +---------+---------------+---------+-----------+----------+--------------+ PERO     Full                                                        +---------+---------------+---------+-----------+----------+--------------+   +---------+---------------+---------+-----------+----------+--------------+ LEFT     CompressibilityPhasicitySpontaneityPropertiesThrombus Aging +---------+---------------+---------+-----------+----------+--------------+ CFV      Full           Yes      Yes                                 +---------+---------------+---------+-----------+----------+--------------+ SFJ      Full                                                        +---------+---------------+---------+-----------+----------+--------------+ FV Prox  Full                                                        +---------+---------------+---------+-----------+----------+--------------+ FV Mid   Full                                                        +---------+---------------+---------+-----------+----------+--------------+ FV DistalFull                                                         +---------+---------------+---------+-----------+----------+--------------+ PFV      Full                                                        +---------+---------------+---------+-----------+----------+--------------+ POP      Full           Yes      Yes                                 +---------+---------------+---------+-----------+----------+--------------+  PTV      Full                                                        +---------+---------------+---------+-----------+----------+--------------+ PERO     Full                                                        +---------+---------------+---------+-----------+----------+--------------+     Summary: RIGHT: - There is no evidence of deep vein thrombosis in the lower extremity.  - No cystic structure found in the popliteal fossa.  LEFT: - There is no evidence of deep vein thrombosis in the lower extremity.  - No cystic structure found in the popliteal fossa.  *See table(s) above for measurements and observations. Electronically signed by Harold Barban MD on 01/12/2020 at 6:05:45 PM.    Final    Assessment and Plan:  This is a pleasant 80 year old black male with metastatic non-small cell lung cancer, adenocarcinoma who presented with a left upper lobe pleural-based mass which extended into the upper mediastinum and upper thoracic spine. PD-L1 1% and there were no actual mutations on molecular studies.  I discussed the diagnosis, prognosis, and treatment options with the patient.  We discussed the incurable nature of his disease.  The patient will be seen by Dr. Julien Nordmann to discuss his treatment options.  Recommend additional staging work-up including a PET scan which will need to be performed as an outpatient and MRI of the brain.  The patient can be considered for referral to hospice/palliative care versus a trial of systemic treatment with chemotherapy and immunotherapy.  Further discussion per Dr. Julien Nordmann.  Thank you for  this referral.   Mikey Bussing, DNP, AGPCNP-BC, AOCNP  ADDENDUM: Hematology/Oncology Attending: I had a face-to-face encounter with the patient today.  I reviewed his record and obtain a history and performing a physical exam on this patient.  I also recommended his care plan.  This is a very pleasant 80 years old African-American male with history of asthma and glaucoma.  He presented to the emergency department complaining of left-sided chest pain.  During his work-up he had CT scan of the chest on December 04, 2019 and that showed a 7.7 x 5.6 cm left upper lobe pleural-based mass extending into the upper mediastinum.  The mass abuts the superior aspect of the aortic arch and left posterior wall of the upper esophagus.  There was also erosive changes of the T2, T3 and T4 vertebrae due to infiltration of the left upper lobe mass.  There are destructive changes of the posterior left third and fourth ribs and probably extension of the mass into the left T3-T4 neural foramina and central canal at T3 and T4 level.  The patient underwent a course of palliative radiotherapy under the care of Dr. Sondra Come.  He also underwent T2-3 and T3-4 laminectomy for resection of epidural spinal tumor under the care of Dr. Arnoldo Morale.  The final pathology [id number]) was consistent with adenocarcinoma. The adenocarcinoma is positive with TTF-1 and focally positive with Napsin-A. The immunophenotype is consistent with metastatic lung adenocarcinoma. [facility] molecular studies and  PD-L1 expression.  He has no actionable mutation and PD-L1 expression was 1%. The patient was supposed to come to the clinic on outpatient basis for evaluation and recommendation regarding treatment of his condition but he has been in the hospital for several weeks now.  He is currently undergoing inpatient rehabilitation. The patient and his family requested a discussion with medical oncology regarding his  diagnosis as well as potential treatment options.  The patient is expected to be discharged to outpatient rehabilitation facility soon. He is very depressed today and concern about wearing the vest and limitation in his movement. I had a lengthy discussion with the patient today about his condition and treatment options. I recommended for the patient to have a PET scan as well as MRI of the brain performed on outpatient basis after discharge to complete the staging work-up. I also discussed with the patient the treatment options including palliative care and hospice referral versus consideration of palliative systemic chemotherapy with carboplatin for AUC of 5, Alimta 500 mg/M2 and Keytruda 200 mg IV every 3 weeks. The patient indicated that he would be interested in treatment but after improvement of his general condition. I will arrange for the patient a follow-up appointment with me at the [facility] after discharge for more detailed discussion of his chemotherapy option and also the staging work-up. For now he will continue with physical therapy as recommended by the rehabilitation team. The patient was advised to call if we can help him in any way in the interval.  Disclaimer: This note was dictated with voice recognition software. Similar sounding words can inadvertently be transcribed and may be missed upon review. Eilleen Kempf, MD

## 2020-02-04 NOTE — Progress Notes (Addendum)
[facility] PHYSICAL MEDICINE & REHABILITATION PROGRESS NOTE   Subjective/Complaints:   Pt was confused this AM- said to me/OT and nursing-" I'm aggravated you are trying to confuse me- and I'm upset about this". Also kicked nursing out when they brought him AM meds- also refused yesterday.   Last pain meds 2 days ago for tramadol and 4 days ago for Norco, so that's not the cause.   Also, per nutrition has lost 16 lbs since 12/21- d/w family about PEG since not eating - <25% of meals AND not taking supplements regularly.  They want a PEG_ called IR- they said pt has had hernia repair and therefore need to call gen surgery about PEG.   Also, after I left, pt telling nurse cannot see out of R eye "at all"-  I ordered CT of head STAT as well as labs including CBC/CMP and also called Ophtho- they said due to confusion, there's not an easy way to examine pt- said call them back when he's less confused so they can do eye exam, which requires pt to be with it.   Also d/w these issues and PET scan and Dr Julien Nordmann coming tomorrow with pt's family- brother AND daughter on phone-   ROS:  .limited due to cognition  Objective:   No results found. No results for input(s): WBC, HGB, HCT, PLT in the last 72 hours. No results for input(s): NA, K, CL, CO2, GLUCOSE, BUN, CREATININE, CALCIUM in the last 72 hours.  Intake/Output Summary (Last 24 hours) at 02/04/2020 1128 Last data filed at 02/03/2020 2159 Gross per 24 hour  Intake 277 ml  Output 425 ml  Net -148 ml    Physical Exam: Vital Signs Blood pressure (!) 117/105, pulse (!) 127, temperature 97.7 F (36.5 C), temperature source Oral, resp. rate 16, height 5\' 9"  (1.753 m), weight 63.9 kg, SpO2 (!) 85 %. Constitutional: sitting up in w/c at sink washing his teeth- very upset/aggravated- OT in room, NAD- obvious weight loss lately.  HEENT: EOMI, oral membranes moist-O2 via [place] 2L- not wearing in nose- put in in nose- upset pt Neck:  supple Cardiovascular: tachycardic- in 120s- regular rhythm? Respiratory/Chest: CTA B/L- no W/R/R- good air movement GI/Abdomen: Soft, NT, ND, (+)BS   Ext: no clubbing, cyanosis, or edema- very tight in L>R upper traps- also bones more prominent -losing weight Psych: upset/aggravated- says we are trying to make him crazy".  Skin: Warm and dry.  Intact. Musc: No edema in extremities.  No tenderness in extremities. Neuro: very confused- not even sure he was in hospital/month/year.  Motor: Bilateral upper extremities: Grossly 5/5 proximal distal Bilateral lower extremities: Grossly 4+/5 proximal to distal, stable  Assessment/Plan: 1. Functional deficits which require 3+ hours per day of interdisciplinary therapy in a comprehensive inpatient rehab setting.  Physiatrist is providing close team supervision and 24 hour management of active medical problems listed below.  Physiatrist and rehab team continue to assess barriers to discharge/monitor patient progress toward functional and medical goals  Care Tool:  Bathing    Body parts bathed by patient: Right arm,Left arm,Chest,Abdomen,Right upper leg,Left upper leg,Face,Front perineal area   Body parts bathed by helper: Buttocks,Right lower leg,Left lower leg Body parts n/a: Front perineal area,Buttocks,Right lower leg,Left lower leg   Bathing assist Assist Level: Minimal Assistance - Patient > 75%     Upper Body Dressing/Undressing Upper body dressing   What is the patient wearing?: Pull over shirt    Upper body assist Assist Level: Set up assist  Lower Body Dressing/Undressing Lower body dressing      What is the patient wearing?: Incontinence brief,Pants     Lower body assist Assist for lower body dressing: Moderate Assistance - Patient 50 - 74%     Toileting Toileting    Toileting assist Assist for toileting: Moderate Assistance - Patient 50 - 74% Assistive Device Comment: BSC   Transfers Chair/bed  transfer  Transfers assist     Chair/bed transfer assist level: Minimal Assistance - Patient > 75% Chair/bed transfer assistive device: Programmer, multimedia   Ambulation assist      Assist level: Minimal Assistance - Patient > 75% Assistive device: Walker-rolling Max distance: 50'   Walk 10 feet activity   Assist     Assist level: Minimal Assistance - Patient > 75% Assistive device: Walker-rolling   Walk 50 feet activity   Assist Walk 50 feet with 2 turns activity did not occur: Safety/medical concerns  Assist level: Minimal Assistance - Patient > 75% Assistive device: Walker-rolling    Walk 150 feet activity   Assist Walk 150 feet activity did not occur: Safety/medical concerns         Walk 10 feet on uneven surface  activity   Assist Walk 10 feet on uneven surfaces activity did not occur: Safety/medical concerns         Wheelchair     Assist Will patient use wheelchair at discharge?: Yes Type of Wheelchair: Manual    Wheelchair assist level: Supervision/Verbal cueing Max wheelchair distance: 100'    Wheelchair 50 feet with 2 turns activity    Assist        Assist Level: Supervision/Verbal cueing   Wheelchair 150 feet activity     Assist      Assist Level: Moderate Assistance - Patient 50 - 74%   Blood pressure (!) 117/105, pulse (!) 127, temperature 97.7 F (36.5 C), temperature source Oral, resp. rate 16, height 5\' 9"  (1.753 m), weight 63.9 kg, SpO2 (!) 85 %.  Medical Problem List and Plan: 1.  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  1/7- spoke to brother yesterday who said he'd pass on to daughter- sounds  like he didn't- palliative care to see pt  1/10- palliative saw pt- is DNAR/DNI now  1/11- called Oncology on call- trying to figure out plan for treatment- was told 12/27 that would do radiation and then immediately do Chemo, however, due to dispo issues (family cannot take home) needs to go to SNF after radiation- spoke at length to Dr Burr Medico- she or Dr Julien Nordmann will see pt today/tomorrow.   1/12- Cannot do PET scan until d/c form hospital. Dr Tommy Medal to see pt Friday  Continue CIR  2.  Antithrombotics: -DVT/anticoagulation:   Heparin.  Mechanical: Sequential compression devices, below knee Bilateral lower extremities   Dopplers negative for DVT  -antiplatelet therapy: N/A 3. Pain Management:    Decreased oxycodone to q4H prn.  DC gabapentin since causing constipation  Added Cymbalta 30 mg daily.   1/3- will increase Duloxetine to 60 mg nightly- for nerve pain  1/5- not taking pain meds often- encouraged pt to take pain meds before therapy.   1/6- will reduce oxy  to Norco q4 hours prn due to increased confusion per family (nursing hasn't seen it)  with pain meds  1/9 pt told me his pain is under control presently  1/10- will place lidoderm patches on shoulders/upper traps B/L where muscles REAL tight.   1/11- can't have lidoderm in >3 patches, but refused yesterday- will check into this.  1/12- saw after left room, pt refused ALL meds this AM-   1/13- no pain meds in 2-4 days- tramadol or Norco 4. Mood: LCSW to follow for evaluation and support.              -antipsychotic agents: N/a 5. Neuropsych: This patient is capable of making decisions on his own behalf.  Appreciate neuropsych 6. Skin/Wound Care: Routine pressure relief measures.  7. Fluids/Electrolytes/Nutrition: Monitor I/Os.             Added juven to promote healing.  8. Neurogenic bladder: Foley in place due to urinary retention/UTI, with plans to remove on Monday  Flomax daily initiated on 12/30, monitor blood pressure  closely  Florinef 0.1 mg daily initiated to assist with low BP.   Improving on 1/2  1/3- will d/c foley this afternoon, after radiation  1/4- wasn't removed- will remove today- this AM and ordered I/o caths and PVRs since might not void.  1/5- so far, no voiding- cathing required 100%   1/6-9- no voiding- caths volumes 300-600c q6 hours  1/10- no voiding- in/out caths being done- pt doesn't want foley back.  1/11- no voiding still  1/12- no voiding- doing I/o caths- nursing doing- will need foley when goes to SNF  9.  Acute renal failure: Resolved  Creatinine 0.86 on 12/27  10. Acute blood loss anemia:   Hemoglobin 12.4 on 12/28  Continue to monitor 11. Metastatic Lung AdenoCA:  On dulera--has been on supplemental oxygen  1/4- didn't get radiation yesterday- is scheduled ot go back today.  1/6- Getting radiation daily M-F  1/13- Dr Julien Nordmann to see Friday- cannot get PET scan since inpt- explained to family.   12. Kleb/Proteus UTI: Completed course of Cipro 13. 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  1/5- refused bowel program last night  And as a result had accident this AM.   1/6- hard clumps of stool- will add Miralax daily.   1/7- hard clumps- just added miralax so will wait to increase til this weekend.   1/9 pt had small pebbles with bp last night. Will increase miralax to bid   -add metamucil   1/10- added Sorbitol today x1- and follow with suppository  1/11- pt had good BM last night.    1/12- will give enema tonight per request of nursing 14. Hypotension/tachycardia-   Severe orthostasis, negative on 12/31  Relatively  controlled on 1/2  TEDs/abdominal binder for support.   1/5- added midodrine 5 mg TID-AC for low Bps  1/7- increased Midodrine to 10 mg TID- will also increase Florinef to help with orthostatic hypotension. It's really bad when goes to radiation  1/9 bp more robust with recent changes  1/12- doing better today per  OT, but then refused all AM meds-   1/13- tachycardia to 120s- will check EKG and labs to see if dehydrated-Last BUN 25- will give IVFs D5NS 75cc/hour x 1 days and monitor  15.  Sleep disturbance: Scheduled melatonin at nights.   Trazodone 100  1/3- sleeping well- con't regimen 17. Tenting/mild dehydration- will push 6 cups/water daily minimum  1/6- skin very dry- order eucerin AND push PO fluids 18. Hyperkalemia  Lokelma ordered on 12/23  Potassium 4.6 on 12/27 19. Pancoast tumor/ metastatic lung cancer:   Radiation to begin on 1/3 and to see Dr Julien Nordmann 1/3 at 2:15 pm  1/4- didn't get radiation- will make sure scheduled to  go back today  1/5- made sure pt went to radiation -his BP was low- but gave him 500cc IVFs NS bolus to help with BP- BP was 82/50s.   1/7- Increased Midodrine and florienf to help with low BP.  20. Dysphagia per pt  12/30- ordered SLP eval and treat  1/3- placed on D3 thin diet  1/4- is better when sitting up at 90 degrees  1/6- will add Protonix for reflux Sx's.   1/8 MBS performed 1/7--mod oral/mild pharyng dysphagia   -D1, thins recommended by SLP, meds with puree  1/10-  Pt says doesn't help much with D1 thins.   1/12- per SLP, working on D2, but without teeth, cannot do D2 right now until passed trials 21. Hypercalcemia  1/4- is down to 11 from 11.2- will monitor- if goes back up more, will treat. 22. Poor appetite  1/10- will try Marinol 2.5 mg BID  1/12- not working as of yet- placed nutrition consult since not taking supplements - "too sticky".  22. Depression  1/11- on Cymbalta but cannot add Wellbutrin- has allergy- will put small dose of  Celexa 10 mg daily on board-so he doesn't have issues with serotonin syndrome.   1/12- think it's the reason pt refused meds this AM, encouraged pt to take his meds for low BP, depression, etc.   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  1/13- want to make sure doesn't have stroke- will check head CT asap. Also called Ophtho- they said call back when confusion better and will examine.  Until then, cannot.  24. Confusion  1/13- could be marinol, since not receiving pain meds- will stop marinol 25. Severe malnutrition  1/13- has lost 16 lbs in last 3 weeks from 12/21- d/w family about a PEG-  And they agreed- cannot get from IR because hx of hernia repair with mesh- will call gen surgery.   I spent a total of 1 hour of car eon pt today- called family x30 minutes, spoke to IR about pt/PEG_ refused; called gen surgery- will see; dealing with tachycardia/reviewing labs, ordering new orders and speaking with Ophtho.  >50% time spent on coordination of care.   LOS: 23 days A FACE TO FACE EVALUATION WAS PERFORMED  Gregory Bautista 02/04/2020, 11:28 AM

## 2020-02-04 NOTE — Progress Notes (Signed)
Patient off unit getting head CT.  Prior to patient leaving RN assessed right eye.  Right eye is red and irritated.  RN asked patient if his eye is itching or has he been rubbing his eye.  Patient said yes he has been rubbing his eye.

## 2020-02-04 NOTE — Consult Note (Signed)
Stroke Neurology Consultation Note  Consult Requested by: Dr. Dagoberto Ligas  Reason for Consult: code stroke  Consult Date: 02/04/20   The history was obtained from the pt and chart.  During history and examination, all items were able to obtain unless otherwise noted.  History of Present Illness:  Gregory Bautista is a 80 y.o. 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 He has been on daily radio therapy for his cancer treatment. Dr. Inda Merlin will see him tomorrow for oncology consult.   Pt this morning confused and agitated, has been refusing medications. He told RN around 11:00am that he can not see from his right eye at all. Not sure if he had sudden onset right vision loss at time. Dr. Dagoberto Ligas notified and CT head stat no acute abnormalities. She also called ophtho and it determined that pt likely not cooperative enough for eye exam. However, given the acute vision loss, code stroke was called.   I went to see pt, he is sitting in chair, not in distress. I asked him when his right eye vision started to have problems, he said he has had right eye vision difficulty to see for 3-4 days. It got progressively worse and today he hardly able to see anything out of right eye. I asked him why he did not tell us earlier, he said because his eye doctor is not here, so he did not tell us. However, RN told me that pt told her he lost vision just this am. Pt not fully orientated and with morning confusion, not sure the exact time line of his right vision loss at this time.  Other than that, pt denies arm leg weakness numbness out of his baseline. No speech changes. Of note, he had T2-3 and T3-4 laminectomy for resection of epidural spinal spinal tumor for debulking and decompression of T spine. Pt stated that since then he still has some weakness on the right leg.  He is getting daily radio therapy in [facility]. He has not been eating well and lost 16lbs since 12/21, and planing for possible PEG. Dr. Inda Merlin will see him tomorrow for oncology consult, but seems difficult to get PET as inpt.  LSN: unclear, ? 3-4 days ago tPA Given: No: unclear time onset, possible spinal cord mets, recent spinal cord surgery within 3 months.  Past Medical History:  Diagnosis Date  . Allergic rhinitis    NEC  . Asthma   . Glaucoma   . 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 Tobacco use disorder 09/24/2019    Past Surgical History:  Procedure Laterality Date  . COLON SURGERY    . EYE SURGERY Right   . LAMINECTOMY WITH POSTERIOR LATERAL ARTHRODESIS LEVEL 4 N/A 12/30/2019   Procedure: THORACIC THREE LAMINECTOMY, THORACIC INSTRUMENTATION AND FUSION THORACIC ONE-FIVE;  Surgeon: Newman Pies, MD;  Location: [facility];  Service: Neurosurgery;  Laterality: N/A;    Family History  Problem Relation Age of Onset  . Cancer Mother   . Heart attack Father   . Cancer Brother     Social History:  reports that he quit smoking about 4 months ago. His smoking use included cigarettes. He started smoking about 64 years ago. He has a 30.00 pack-year smoking history. He has never used smokeless tobacco. He reports that he does not drink alcohol and does not use drugs.  Allergies:  Allergies  Allergen Reactions  . Penicillins Anaphylaxis and Other (See Comments)    Pt does not recall this reaction 2021*Tolerated Cephalexin*  Has patient had a PCN reaction causing immediate rash, facial/tongue/throat swelling, SOB or lightheadedness with hypotension: Yes Has patient had a PCN reaction causing severe rash involving mucus membranes or skin necrosis: No Has patient had a PCN reaction that required hospitalization No Has patient had a PCN reaction occurring within the last 10 years: Yes If all of the above answers are "NO", then may proceed with Cephalosporin use  . Wellbutrin  [Bupropion] Other (See Comments)    Patient disputes this in 2021 Seizures  . Morphine And Related Itching and Other (See Comments)    Patient disputes this in 2021    No current facility-administered medications on file prior to encounter.   Current Outpatient Medications on File Prior to Encounter  Medication Sig Dispense Refill  . albuterol (VENTOLIN HFA) 108 (90 Base) MCG/ACT inhaler Inhale 2 puffs into the lungs every 6 (six) hours as needed for wheezing or shortness of breath.    [name] aspirin EC 81 MG tablet Take 81 mg by mouth daily. Swallow whole.    . cholecalciferol (VITAMIN D3) 25 MCG (1000 UNIT) tablet Take 1,000 Units by mouth daily.    . ciprofloxacin (CIPRO) 500 MG tablet Take 1 tablet (500 mg total) by mouth 2 (two) times daily. 10 tablet   . cyclobenzaprine (FLEXERIL) 10 MG tablet Take 1 tablet (10 mg total) by mouth 3 (three) times daily as needed for muscle spasms. 30 tablet 0  . diclofenac Sodium (VOLTAREN) 1 % GEL Apply 1 application topically 4 (four) times daily as needed (pain).     [name] docusate sodium (COLACE) 100 MG capsule Take 1 capsule (100 mg total) by mouth 2 (two) times daily. 10 capsule 0  . gabapentin (NEURONTIN) 100 MG capsule Take 100 mg by mouth 3 (three) times daily.    [name] ipratropium (ATROVENT) 0.06 % nasal spray Place 2 sprays into both nostrils daily as needed for rhinitis.    . Multiple Vitamin (MULTIVITAMIN WITH MINERALS) TABS tablet Take 1 tablet by mouth daily.    [name] oxyCODONE (OXY IR/ROXICODONE) 5 MG immediate release tablet Take 1 tablet (5 mg total) by mouth every 4 (four) hours as needed for moderate pain ((score 4 to 6)). 30 tablet 0  . polyethylene glycol (MIRALAX / GLYCOLAX) 17 g packet Take 17 g by mouth daily. 14 each 0  . senna-docusate (SENOKOT-S) 8.6-50 MG tablet Take 1 tablet by mouth 2 (two) times daily.    . SYMBICORT 160-4.5 MCG/ACT inhaler Inhale 1 puff into the lungs 2 (two) times daily as needed (shortness of breath).     . tamsulosin  (FLOMAX) 0.4 MG CAPS capsule Take 1 capsule (0.4 mg total) by mouth daily after breakfast. 30 capsule 0  . timolol (TIMOPTIC) 0.5 % ophthalmic solution Place 1 drop into both eyes 2 (two) times daily.     . Travoprost, BAK Free, (TRAVATAN) 0.004 % SOLN ophthalmic solution Place 1 drop into both eyes at bedtime.       Review of Systems: A full ROS was attempted today and was able to be performed.  Systems assessed include - Constitutional, Eyes, HENT, Respiratory, Cardiovascular, Gastrointestinal, Genitourinary, Integument/breast, Hematologic/lymphatic, Musculoskeletal, Neurological, Behavioral/Psych, Endocrine, Allergic/Immunologic - with pertinent responses as per HPI.  Physical Examination: Temp:  [97.7 F (36.5 C)-99 F (37.2 C)] 97.7 F (36.5 C) (01/13 0914) Pulse Rate:  [99-127]  127 (01/13 0914) Resp:  [14-18] 16 (01/13 0914) BP: (100-127)/(54-105) 117/105 (01/13 0914) SpO2:  [85 %-100 %] 85 % (01/13 0914) FiO2 (%):  [26 %] 26 % (01/13 0751)  General - well nourished, well developed, in no apparent distress.    Ophthalmologic - fundi not visualized due to noncooperation.    Cardiovascular - regular rhythm and rate  Neuro - awake alert, eyes open, orientated to place and months, but not orientated to age and year.  No aphasia, however, paucity of speech and difficulty describing pictures, but able to name and read.  Follows simple commands.  Right eye no light perception, left eye able to count fingers.  Left eye visual field full.  No gaze palsy.  Patient symmetrical, tongue midline.  Moving bilateral upper extremities equally.  Left lower extremity no drift, right lower extremity barely against gravity but not hit bed within 5 sec.  Sensation symmetrical.  Finger-to-nose intact.  Gait not tested.  NIH Stroke Scale  Level Of Consciousness 0=Alert; keenly responsive 1=Arouse to minor stimulation 2=Requires repeated stimulation to arouse or movements to pain 3=postures or  unresponsive 0  LOC Questions to Month and Age 48=Answers both questions correctly 1=Answers one question correctly or dysarthria/intubated/trauma/language barrier 2=Answers neither question correctly or aphasia 1  LOC Commands      -Open/Close eyes     -Open/close grip     -Pantomime commands if communication barrier 0=Performs both tasks correctly 1=Performs one task correctly 2=Performs neighter task correctly 0  Best Gaze     -Only assess horizontal gaze 0=Normal 1=Partial gaze palsy 2=Forced deviation, or total gaze paresis 0  Visual 0=No visual loss 1=Partial hemianopia 2=Complete hemianopia 3=Bilateral hemianopia (blind including cortical blindness) 0  Facial Palsy     -Use grimace if obtunded 0=Normal symmetrical movement 1=Minor paralysis (asymmetry) 2=Partial paralysis (lower face) 3=Complete paralysis (upper and lower face) 0  Motor  0=No drift for 10/5 seconds 1=Drift, but does not hit bed 2=Some antigravity effort, hits  bed 3=No effort against gravity, limb falls 4=No movement 0=Amputation/joint fusion Right Arm 0     Leg 1    Left Arm 0     Leg 0  Limb Ataxia     - FNT/HTS 0=Absent or does not understand or paralyzed or amputation/joint fusion 1=Present in one limb 2=Present in two limbs 0  Sensory 0=Normal 1=Mild to moderate sensory loss 2=Severe to total sensory loss or coma/unresponsive 0  Best Language 0=No aphasia, normal 1=Mild to moderate aphasia 2=Severe aphasia 3=Mute, global aphasia, or coma/unresponsive 1  Dysarthria 0=Normal 1=Mild to moderate 2=Severe, unintelligible or mute/anarthric 0=intubated/unable to test 1  Extinction/Neglect 0=No abnormality 1=visual/tactile/auditory/spatia/personal inattention/Extinction to bilateral simultaneous stimulation 2=Profound neglect/extinction more than 1 modality  0  Total   4     Data Reviewed: DG Abd 1 View  Result Date: 01/12/2020 CLINICAL DATA:  Abdominal pain, constipation EXAM: ABDOMEN -  1 VIEW COMPARISON:  None. FINDINGS: Supine frontal view of the abdomen and pelvis excludes the pubic symphysis by collimation. No bowel obstruction or ileus. Significant retained stool throughout the colon consistent with constipation. Multiple surgical clips throughout the upper abdomen. Curvilinear calcification right mid abdomen of uncertain etiology, but could be associated with the right kidney. No acute bony abnormalities. IMPRESSION: 1. Significant fecal retention compatible with constipation. 2. Curvilinear calcification right mid abdomen, of uncertain etiology. This could be related to the right kidney. If further evaluation is desired, ultrasound or CT could be performed. Electronically Signed   By: Legrand Como  Owens Shark M.D.   On: 01/12/2020 17:17   CT HEAD WO CONTRAST  Result Date: 02/04/2020 CLINICAL DATA:  Acute loss of vision right eye. History of lung carcinoma EXAM: CT HEAD WITHOUT CONTRAST TECHNIQUE: Contiguous axial images were obtained from the base of the skull through the vertex without intravenous contrast. COMPARISON:  None. FINDINGS: Brain: There is mild age related volume loss. There is no intracranial mass, hemorrhage, extra-axial fluid collection, or midline shift. Decreased attenuation is noted in the anterior limb of each external capsule, likely due to prior lacunar type infarcts in these areas. No acute appearing infarct is evident. Vascular: No hyperdense vessel. There is calcification in each carotid siphon region. Skull: Bony calvarium appears intact. Sinuses/Orbits: There is mucosal thickening in several ethmoid air cells. Other visualized paranasal sinuses are clear. Orbits appear symmetric bilaterally. Other: Mastoid air cells are clear. 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 No acute appearing infarct is evident. No mass or hemorrhage. There are foci of arterial vascular calcification. There is mucosal thickening in several ethmoid  air cells. Electronically Signed   By: Lowella Grip III M.D.   On: 02/04/2020 12:54   DG Swallowing Func-Speech Pathology  Result Date: 01/29/2020 Objective Swallowing Evaluation: Type of Study: MBS-Modified Barium Swallow Study  Patient Details Name: Quayshawn Nin MRN: [id number] Date of Birth: Jun 09, 1940 Today's Date: 01/29/2020 Time: No data recorded-No data recorded No data recorded Past Medical History: Past Medical History: Diagnosis Date . Allergic rhinitis   NEC . Asthma  . Glaucoma  . 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 Tobacco use disorder 09/24/2019 Past Surgical History: Past Surgical History: Procedure Laterality Date . COLON SURGERY   . EYE SURGERY Right  . LAMINECTOMY WITH POSTERIOR LATERAL ARTHRODESIS LEVEL 4 N/A 12/30/2019  Procedure: THORACIC THREE LAMINECTOMY, THORACIC INSTRUMENTATION AND FUSION THORACIC ONE-FIVE;  Surgeon: Newman Pies, MD;  Location: [facility];  Service: Neurosurgery;  Laterality: N/A; HPI: see H&P  No data recorded Assessment / Plan / Recommendation CHL IP CLINICAL IMPRESSIONS 01/29/2020 Clinical Impression Patient presents with a moderate oral and mild pharyngeal dysphagia. Oral phase of swallow consists of delayed anterior to posterior transit of all boluses (heavier boluses leading to longer delays), decreased bolus cohesion and piecemeal swallowing with mechanical soft solids, decreased mastication of mechanical soft solids. Pharyngeal phase consisted of minimal instances of delays of swallow initiation to vallecular sinus with thin liquids, mild amount of pyriform residuals and trace to mild vallecular sinus residuals with thin liquids,mechanical soft solids. 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 No penetration or aspiration events observed. SLP Visit Diagnosis Dysphagia, oropharyngeal phase (R13.12) Attention and concentration deficit following -- Frontal lobe and executive function deficit  following -- Impact on safety and function No limitations   No flowsheet data found.  No flowsheet data found. CHL IP DIET RECOMMENDATION 01/29/2020 SLP Diet Recommendations Dysphagia 1 (Puree) solids;Thin liquid Liquid Administration via Cup;Straw Medication Administration Whole meds with puree Compensations Minimize environmental distractions;Slow rate;Small sips/bites;Follow solids with liquid Postural Changes Remain semi-upright after after feeds/meals (Comment)   CHL IP OTHER RECOMMENDATIONS 01/29/2020 Recommended Consults -- Oral Care Recommendations Oral care BID Other Recommendations --   CHL IP FOLLOW UP RECOMMENDATIONS 01/29/2020 Follow up Recommendations None   No flowsheet data found.     CHL IP ORAL PHASE 01/29/2020 Oral Phase Impaired Oral - Pudding Teaspoon -- Oral - Pudding Cup -- Oral - Honey Teaspoon -- Oral -  Honey Cup -- Oral - Nectar Teaspoon -- Oral - Nectar Cup -- Oral - Nectar Straw -- Oral - Thin Teaspoon -- Oral - Thin Cup Reduced posterior propulsion;Delayed oral transit Oral - Thin Straw -- Oral - Puree Piecemeal swallowing;Reduced posterior propulsion;Delayed oral transit Oral - Mech Soft Impaired mastication;Weak lingual manipulation;Piecemeal swallowing;Delayed oral transit;Reduced posterior propulsion;Holding of bolus Oral - Regular -- Oral - Multi-Consistency -- Oral - Pill Decreased bolus cohesion;Reduced posterior propulsion;Weak lingual manipulation;Delayed oral transit Oral Phase - Comment --  CHL IP PHARYNGEAL PHASE 01/29/2020 Pharyngeal Phase Impaired Pharyngeal- Pudding Teaspoon -- Pharyngeal -- Pharyngeal- Pudding Cup -- Pharyngeal -- Pharyngeal- Honey Teaspoon -- Pharyngeal -- Pharyngeal- Honey Cup -- Pharyngeal -- Pharyngeal- Nectar Teaspoon -- Pharyngeal -- Pharyngeal- Nectar Cup -- Pharyngeal -- Pharyngeal- Nectar Straw -- Pharyngeal -- Pharyngeal- Thin Teaspoon -- Pharyngeal -- Pharyngeal- Thin Cup Delayed swallow initiation-vallecula;Pharyngeal residue - pyriform Pharyngeal --  Pharyngeal- Thin Straw Delayed swallow initiation-vallecula Pharyngeal -- Pharyngeal- Puree WFL Pharyngeal -- Pharyngeal- Mechanical Soft Pharyngeal residue - valleculae Pharyngeal -- Pharyngeal- Regular -- Pharyngeal -- Pharyngeal- Multi-consistency -- Pharyngeal -- Pharyngeal- Pill WFL Pharyngeal -- Pharyngeal Comment --  CHL IP CERVICAL ESOPHAGEAL PHASE 01/29/2020 Cervical Esophageal Phase Impaired Pudding Teaspoon -- Pudding Cup -- Honey Teaspoon -- Honey Cup -- Nectar Teaspoon -- Nectar Cup -- Nectar Straw -- Thin Teaspoon -- Thin Cup -- Thin Straw -- Puree Prominent cricopharyngeal segment Mechanical Soft Prominent cricopharyngeal segment Regular -- Multi-consistency -- Pill -- Cervical Esophageal Comment -- Sonia Baller, MA, CCC-SLP Speech Therapy             VAS [place] LOWER EXTREMITY VENOUS (DVT)  Result Date: 01/12/2020  Lower Venous DVT Study Other Indications: Immobility. Comparison Study: No previous scan Performing Technologist: Vonzell Schlatter RVT  Examination Guidelines: A complete evaluation includes B-mode imaging, spectral Doppler, color Doppler, and power Doppler as needed of all accessible portions of each vessel. Bilateral testing is considered an integral part of a complete examination. Limited examinations for reoccurring indications may be performed as noted. The reflux portion of the exam is performed with the patient in reverse Trendelenburg.  +---------+---------------+---------+-----------+----------+--------------+ RIGHT    CompressibilityPhasicitySpontaneityPropertiesThrombus Aging +---------+---------------+---------+-----------+----------+--------------+ CFV      Full           Yes      Yes                                 +---------+---------------+---------+-----------+----------+--------------+ SFJ      Full                                                        +---------+---------------+---------+-----------+----------+--------------+ FV Prox  Full                                                         +---------+---------------+---------+-----------+----------+--------------+ FV Mid   Full                                                        +---------+---------------+---------+-----------+----------+--------------+  FV DistalFull                                                        +---------+---------------+---------+-----------+----------+--------------+ PFV      Full                                                        +---------+---------------+---------+-----------+----------+--------------+ POP      Full           Yes      Yes                                 +---------+---------------+---------+-----------+----------+--------------+ PTV      Full                                                        +---------+---------------+---------+-----------+----------+--------------+ PERO     Full                                                        +---------+---------------+---------+-----------+----------+--------------+   +---------+---------------+---------+-----------+----------+--------------+ LEFT     CompressibilityPhasicitySpontaneityPropertiesThrombus Aging +---------+---------------+---------+-----------+----------+--------------+ CFV      Full           Yes      Yes                                 +---------+---------------+---------+-----------+----------+--------------+ SFJ      Full                                                        +---------+---------------+---------+-----------+----------+--------------+ FV Prox  Full                                                        +---------+---------------+---------+-----------+----------+--------------+ FV Mid   Full                                                        +---------+---------------+---------+-----------+----------+--------------+ FV DistalFull                                                         +---------+---------------+---------+-----------+----------+--------------+  PFV      Full                                                        +---------+---------------+---------+-----------+----------+--------------+ POP      Full           Yes      Yes                                 +---------+---------------+---------+-----------+----------+--------------+ PTV      Full                                                        +---------+---------------+---------+-----------+----------+--------------+ PERO     Full                                                        +---------+---------------+---------+-----------+----------+--------------+     Summary: RIGHT: - There is no evidence of deep vein thrombosis in the lower extremity.  - No cystic structure found in the popliteal fossa.  LEFT: - There is no evidence of deep vein thrombosis in the lower extremity.  - No cystic structure found in the popliteal fossa.  *See table(s) above for measurements and observations. Electronically signed by Harold Barban MD on 01/12/2020 at 49:05:45 PM.    Final     Assessment: 80 y.o. 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 On daily radio therapy. He told RN around 11:00am that he can not see from his right eye at all. However, he told me that right eye vision difficulty has been progressively going on for 3-4 days and now blind. Pt poor historian, not fully orientated and with morning confusion, not sure the exact timeline of his right vision loss at this time. CT head no acute abnormality. NIHSS = 4.   He had T2-3 and T3-4 laminectomy for resection of epidural spinal spinal tumor for debulking and decompression of T spine. Had right leg paresis since then. Inda Merlin will see him tomorrow for oncology consult, most of the cancer work up has not been done.    Pt not tPA candidate given unclear time onset, recent spinal cord surgery within 3 months and questionable spinal cord mets. Not IR candidate given no LVO sign, and low NIHSS. Noticed that pt and family would have discussion with Dr. Inda Merlin tomorrow for possible work up and treatment, will hold off further testing at this time. Will touch base with team to see if we need further stroke work up with MRI brain with and without contrast, CTA head and neck, 2D echo. Will check LDL and A1C in am. BP on the low side, recommend IVF. Continue ASA 81.   Stroke Risk Factors - none  Plan: - Will discuss with team in am to see the timing for  further stroke work up  - Frequent neuro checks - Telemetry monitoring - If aggressive care, recommend MRI brain with and without, CTA head and neck, and Echocardiogram  - Check fasting lipid panel and HgbA1C - IVF to avoid low BP - Continue ASA 81 - Discussed with attending Dr. Dagoberto Ligas - Will follow    Thank you for this consultation and allowing [place] to participate in the care of this patient.   Rosalin Hawking, MD PhD Stroke Neurology 02/04/2020 4:39 PM

## 2020-02-04 NOTE — Progress Notes (Signed)
Patient currently in radiation, RN to update consult when patient returns to the room

## 2020-02-04 NOTE — Progress Notes (Signed)
Physical Therapy Session Note  Patient Details  Name: Gregory Bautista MRN: [id number] Date of Birth: 18-Feb-1940  Today's Date: 02/04/2020 PT Individual Time: 5701-7793 PT Individual Time Calculation (min): 40 min   Short Term Goals: Week 1:  PT Short Term Goal 1 (Week 1): Pt will perform least restrictive transfer with min A consistently PT Short Term Goal 1 - Progress (Week 1): Met PT Short Term Goal 2 (Week 1): Pt will ambulate x 50 ft with assist x 1 PT Short Term Goal 2 - Progress (Week 1): Met PT Short Term Goal 3 (Week 1): Pt will initiate stair training PT Short Term Goal 3 - Progress (Week 1): Progressing toward goal Week 2:  PT Short Term Goal 1 (Week 2): pt to demonstrate ambulation with LRAD for 150' CGA PT Short Term Goal 1 - Progress (Week 2): Progressing toward goal PT Short Term Goal 2 (Week 2): pt to demonstrate functional transfers with LRAD at [facility] consistently PT Short Term Goal 2 - Progress (Week 2): Met PT Short Term Goal 3 (Week 2): pt to demonstrate dynamic standing balance at CGA with LRAD PT Short Term Goal 3 - Progress (Week 2): Met PT Short Term Goal 4 (Week 2): initiate stair training PT Short Term Goal 4 - Progress (Week 2): Met Week 3:  PT Short Term Goal 1 (Week 3): =LTG due to ELOS PT Short Term Goal 1 - Progress (Week 3): Progressing toward goal Week 4:  PT Short Term Goal 1 (Week 4): =LTG due to ELOS  Skilled Therapeutic Interventions/Progress Updates:    pt received in [facility] and agreeable to therapy, nursing and NCT present for meds and vitals. Pt required extra time to complete medication which is why therapy session started late. PT and recreational therapist present to improve overall pt's mood and promote activity reintegration. Throughout session, pt verbalized frequent paranoid behaviors stating, "the doctors are controlling my phone" or "no one cares about me here and they all are trying to prove I'm crazy". Therapy attempted to redirect behaviors  however pt required extra time to do this and multiple attempts. Pt requested music for improved mood and participation in therapy, rec therapist attempted to find music pt requested however unable which agitated pt, ultimately able to find a different band on the computer which improved pt's mood however rec therapist had to leave at end of her session, and PT and pt left in room. PT attempted to encourage pt to eat breakfast with multiple cues to attend to food as pt demonstrated poor attention to task, and frequent reoccurrence of paranoid comments. Pt demonstrated improved attention to meal with PT setting up spoon and directing pt to feed himself with single step cues. Pt denied to eat additional food. Pt left in WC at end of session, alarm set, All needs in reach and in good condition. Call light in hand.  At end of session PT took time to reeducate pt on importance of verbalizing needs with call light use as needed.   Therapy Documentation Precautions:  Precautions Precautions: Back,Fall Precaution Comments: reviewed precautions Restrictions Weight Bearing Restrictions: No General:   Vital Signs: Therapy Vitals Temp: 97.7 F (36.5 C) Temp Source: Oral Pulse Rate: (!) 127 Resp: 16 BP: (!) 117/105 Patient Position (if appropriate): Sitting Oxygen Therapy SpO2: (!) 85 % O2 Device: Nasal Cannula O2 Flow Rate (L/min): 1.5 L/min FiO2 (%): 26 % Pain:   Mobility:   Locomotion :    Trunk/Postural Assessment :  Balance:   Exercises:   Other Treatments:      Therapy/Group: Individual Therapy  Gregory Bautista 02/04/2020, 10:07 AM

## 2020-02-04 NOTE — Progress Notes (Incomplete)
Patient ID: Gregory Bautista, male   DOB: [birth year], 80 y.o.   MRN: [id number]   [name], MSW, [place] Office: [phone] Cell: [phone] Fax: [phone]

## 2020-02-04 NOTE — Progress Notes (Signed)
RN went to give patient medications.  Patient asked what all am I taking.  RN stated, "You have several and I will go over them with you."  Patient stated, "I don't know what I am taking and I don't want all of those pills."  RN began to go over medications and patient stated, "Just give me the most important pills.  You are aggravating me and I want you out of here."  RN continued to get medicine ready.  RN then asked the patient if he wanted the medication crushed.  Patient stated, " I don't care just give them to me."  RN gave medication with apple sauce and then patient stated, "I can't take it like this you need to crush them up."  RN then crushed medication and gave to patient.  Therapy arrived prior to RN finishing.  RN had to place Lidocaine patches prior to leaving.  RN began placing Lidocaine patches on patient's left side.  RN stated, "I can put one more to the back if this will help."  Patient stated, "I don't care where you put them, you have been in here a long time and have done nothing."  RN placed last patch on back.  Patient then stated, "You need to leave for a while because you have done nothing but aggravate me."  RN stated, " I'm sorry that I aggravated you this morning."  RN left the room with therapy present.

## 2020-02-04 NOTE — Code Documentation (Signed)
Stroke Response Nurse Documentation Code Documentation  Daniell Mancinas is a 80 y.o. male admitted to [place]. [facility] with past medical hx of lung cancer. Code stroke was activated by Dr. Arma Heading. Patient reported some right eye vision loss to the provider, eye doctor called and requested to follow up with neurology. 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 No acute appearing infarct is evident. No mass or Hemorrhage." With new complaint of vision loss, Code Stroke activated.    Dr. Erlinda Hong and this RN arrived at 1312. Patient reported vision loss started 3-4 days ago. NIHSS 3, see documentation for details and code stroke times. Patient with slight dysarthria, with inability to explain picture, and incorrect age on exam. The following imaging was completed:  CT. Patient is not a candidate for tPA due to being outside the window. Care/Plan: q2 mNIHSS/VS. Bedside handoff with RN Florentina Jenny.    Kathrin Greathouse  Stroke Response RN

## 2020-02-04 NOTE — Progress Notes (Signed)
Occupational Therapy Session Note  Patient Details  Name: Brelan Hannen MRN: [id number] Date of Birth: 05-Nov-1940  Today's Date: 02/04/2020 OT Individual Time: 0700-0755 OT Individual Time Calculation (min): 55 min    Short Term Goals: Week 4:  OT Short Term Goal 1 (Week 4): Pt will don pants with min A sit<>stand from w/c or EOB OT Short Term Goal 2 (Week 4): Pt will perform toilet transfers with CGA using RW OT Short Term Goal 3 (Week 4): Pt will tolerate standing for 3 mins during functional tasks/ADLs with CGA  Skilled Therapeutic Interventions/Progress Updates:   Pt resting in bed upon arrival with TV on but sound muted. Pt agreeable to getting OOB to wash face, brush teeth, and eat breakfast. Pt's BLE wrapped with ACE wraps and Ted hose donned from previous day (RN notified). Supine>sit EOB wity supervision. Stand pivot transfer with min A. Pt completed grooming and brushing teeth with supervision and more then a reasonable amount of time. BP supine-112/74 HR 122; sitting EOB 108/62 HR 118. No reports of dizziness. Pt required min verbal cues to initiate setting up breakfast tray to eat. Pt remained seated in w/c with breakfast tray on table and belt alarm activated.  Therapy Documentation Precautions:  Precautions Precautions: Back,Fall Precaution Comments: reviewed precautions Restrictions Weight Bearing Restrictions: No General:   Pain: Pt denies pain this morning  Therapy/Group: Individual Therapy  Leroy Libman 02/04/2020, 7:57 AM

## 2020-02-04 NOTE — Progress Notes (Signed)
Patient stated during med pass that he has lost vision in his right eye.  That is a new development and patient asked to see an eye doctor.  RN informed patient that Dr. Dagoberto Ligas will be notified.

## 2020-02-04 NOTE — Progress Notes (Signed)
SLP Cancellation Note  Patient Details Name: Gregory Bautista MRN: [id number] DOB: [birth year]   Cancelled treatment:        Patient getting Head CT due to reported changes in cognition this morning.                                                                                              Sonia Baller, MA, CCC-SLP Speech Therapy

## 2020-02-04 NOTE — Progress Notes (Signed)
RN assessing neuro on patient during medication pass.  Patient became agitated with RN and not understanding what I was asking of him.  Patient had medication crushed with apple sauce this morning and RN trying to explain to patient how he will be taking his medication.  Patient stated, " Ma'am I don't know what you are asking me to do and I need to go to the bathroom right now.  I'm not taking medication."  NT assisted patient with personal care.  RN will explain situation to night shift RN regarding medications for day shift.

## 2020-02-04 NOTE — Progress Notes (Signed)
Recreational Therapy Session Note  Patient Details  Name: Fabien Travelstead MRN: [id number] Date of Birth: [birth year] Today's Date: 02/04/2020  Pain: no c/o Skilled Therapeutic Interventions/Progress Updates:   Session 1:RN at the bedside administering meds upon arrival.  LRT scheduled as co-treat with PT to assist with mood and leisure integration.  Pt sharing that he felt like people thought he was crazy and were controlling things on his cell phone, what he was able to access.  Attempted to redirect pt through use of music and eventually with success.  Encouraged pt to eat his breakfast as he had not touched it.  Pt stated he didn't like it, it was the same thing over and over.  Encouraged the importance of hydration and nutrition, pt acknowledged education but continued to refuse his meal.  PT encouraged pt to try the ice cream and pt took a few bites.  Session 2:  Pt in bed upon arrival with complaints of feeling cold, requesting additional blankets.  Pt stated that he felt like he was in a cold environment.  With questioning, pt stated that he felt like the temperature was too cold for someone trying to get better, that he felt as if people were working against him vs trying to help him.  LRT provided additional blankets and assisted pt with putting on a sweatshirt.  Discussed how medical issues could be contributing to him being cold, encouraged meal consumption and hydration as able.  Pt stated understanding and appreciation.   Therapy/Group: Cotreatment & Individual   Arleta Ostrum 02/04/2020, 2:49 PM

## 2020-02-04 NOTE — Progress Notes (Signed)
Pt refused all medications last night and this morning. RN encouraged discussion with care team re: plan of care.   Pt had Yellow MEWS this am. See flowsheets for details.   Oncoming day shift RN made aware.

## 2020-02-04 NOTE — Progress Notes (Signed)
Pt refused all night time medications x2. Stated he did not want to take anything. NT, Dorina Hoyer. was also present in room and witnessed medication refusal. Pt stating that nursing staff was bothering him. RN educated on need to take VS as ordered by MD to ensure safety. Pt then agreeable to have VS taken.   See flow sheets for VS, shift assessments, and focused neuro assessments.

## 2020-02-05 ENCOUNTER — Inpatient Hospital Stay (HOSPITAL_COMMUNITY): Payer: Medicare Other

## 2020-02-05 ENCOUNTER — Ambulatory Visit
Admission: RE | Admit: 2020-02-05 | Discharge: 2020-02-05 | Disposition: A | Discharge status: Home / Self Care | Payer: Medicare Other | Source: Ambulatory Visit | Attending: Radiation Oncology | Admitting: Radiation Oncology

## 2020-02-05 ENCOUNTER — Inpatient Hospital Stay (HOSPITAL_COMMUNITY): Payer: Medicare Other | Admitting: Speech Pathology

## 2020-02-05 ENCOUNTER — Inpatient Hospital Stay (HOSPITAL_COMMUNITY): Payer: Medicare Other | Admitting: Physical Therapy

## 2020-02-05 DIAGNOSIS — R918 Other nonspecific abnormal finding of lung field: Secondary | ICD-10-CM | POA: Diagnosis not present

## 2020-02-05 DIAGNOSIS — R1312 Dysphagia, oropharyngeal phase: Secondary | ICD-10-CM

## 2020-02-05 DIAGNOSIS — H3411 Central retinal artery occlusion, right eye: Secondary | ICD-10-CM

## 2020-02-05 DIAGNOSIS — E43 Unspecified severe protein-calorie malnutrition: Secondary | ICD-10-CM

## 2020-02-05 LAB — RENAL FUNCTION PANEL
Albumin: 2.2 g/dL — ABNORMAL LOW (ref 3.5–5.0)
Anion gap: 10 (ref 5–15)
BUN: 18 mg/dL (ref 8–23)
CO2: 29 mmol/L (ref 22–32)
Calcium: 10.3 mg/dL (ref 8.9–10.3)
Chloride: 102 mmol/L (ref 98–111)
Creatinine, Ser: 0.78 mg/dL (ref 0.61–1.24)
GFR, Estimated: >60 mL/min (ref >60)
Glucose, Bld: 131 mg/dL — ABNORMAL HIGH (ref 70–99)
Phosphorus: 2.9 mg/dL (ref 2.5–4.6)
Potassium: 3.7 mmol/L (ref 3.5–5.1)
Sodium: 141 mmol/L (ref 135–145)

## 2020-02-05 LAB — CBC
HCT: 32.6 % — ABNORMAL LOW (ref 39.0–52.0)
Hemoglobin: 10.5 g/dL — ABNORMAL LOW (ref 13.0–17.0)
MCH: 28.5 pg (ref 26.0–34.0)
MCHC: 32.2 g/dL (ref 30.0–36.0)
MCV: 88.6 fL (ref 80.0–100.0)
Platelets: 234 10*3/uL (ref 150–400)
RBC: 3.68 MIL/uL — ABNORMAL LOW (ref 4.22–5.81)
RDW: 12.8 % (ref 11.5–15.5)
WBC: 5.5 10*3/uL (ref 4.0–10.5)
nRBC: 0 % (ref 0.0–0.2)

## 2020-02-05 LAB — HEMOGLOBIN A1C
Hgb A1c MFr Bld: 5.7 % — ABNORMAL HIGH (ref 4.8–5.6)
Mean Plasma Glucose: 116.89 mg/dL

## 2020-02-05 LAB — LIPID PANEL
Cholesterol: 120 mg/dL (ref 0–200)
HDL: 44 mg/dL (ref >40)
LDL Cholesterol: 64 mg/dL (ref 0–99)
Total CHOL/HDL Ratio: 2.7 RATIO
Triglycerides: 58 mg/dL (ref <150)
VLDL: 12 mg/dL (ref 0–40)

## 2020-02-05 IMAGING — CT CT ANGIO HEAD
1 of 11 series · 5 of 33 positions shown · IV contrast (omnipaque)
Comparison: CT head [DATE].  Same day MRI.

CLINICAL DATA: Neuro deficits, subacute.

EXAM:
CT ANGIOGRAPHY HEAD AND NECK
TECHNIQUE: Multidetector CT imaging of the head and neck was performed using
the standard protocol during bolus administration of intravenous
contrast. Multiplanar CT image reconstructions and MIPs were
obtained to evaluate the vascular anatomy. Carotid stenosis
measurements (when applicable) are obtained utilizing NASCET
criteria, using the distal internal carotid diameter as the
denominator.
CONTRAST:  75mL OMNIPAQUE IOHEXOL 350 MG/ML SOLN

[Series 14: ax thins · axial · 0.39mm/px · z∈[+1237,+1456]mm · 5 of 329 slices shown]
[im 55/329  soft-tissue]
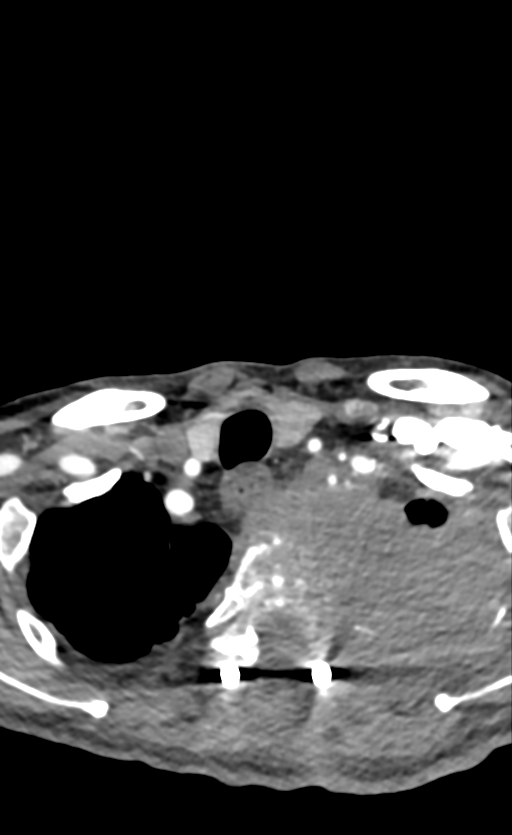
[im 110/329  bone]
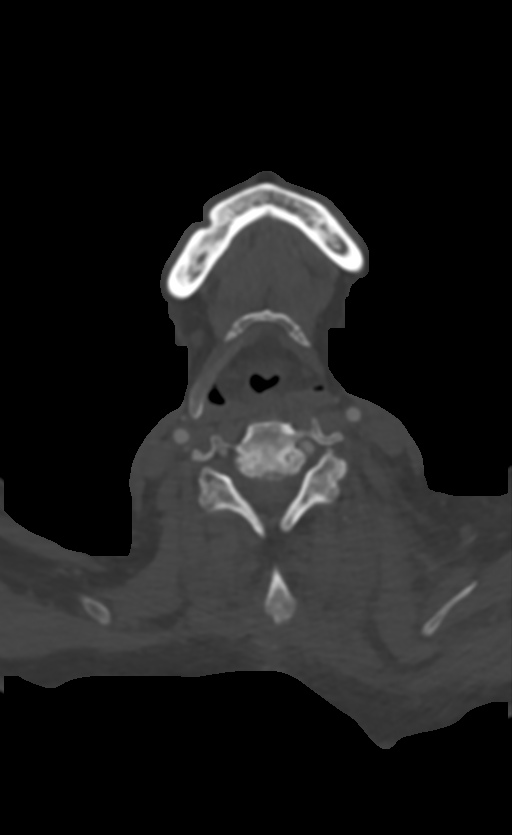
[im 165/329  soft-tissue]
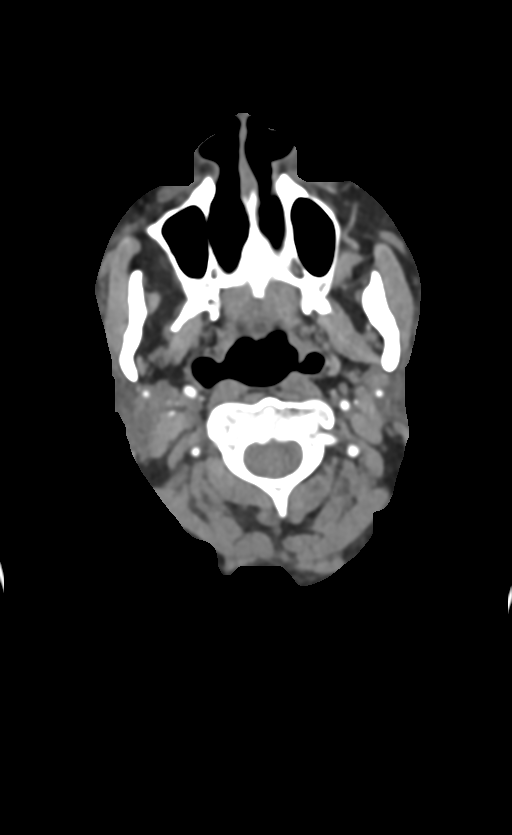
[im 219/329  bone]
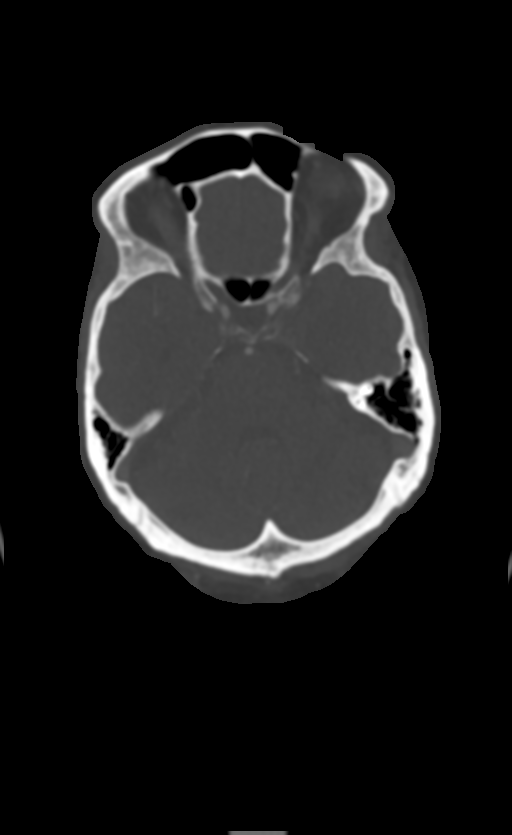
[im 274/329  soft-tissue]
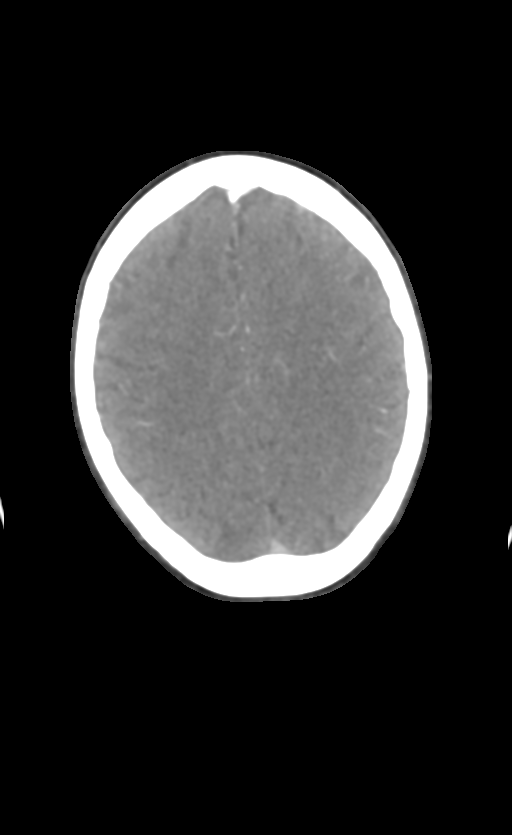

[5 of 33 positions shown; findings below may reference images not displayed]

FINDINGS: CT HEAD FINDINGS

Brain: No evidence of acute large vascular territory infarction,
hemorrhage, hydrocephalus, extra-axial collection or mass
lesion/mass effect. Similar patchy white matter hypoattenuation,
most likely related to chronic microvascular ischemic disease.

Vascular: Calcific atherosclerosis.

Skull: No acute fracture.

Sinuses: Sinuses are largely clear.  Unremarkable orbits.

Orbits: No mastoid effusions.

Review of the MIP images confirms the above findings

CTA NECK FINDINGS

Aortic arch: Atherosclerosis of the aorta. The right subclavian
artery origin is not imaged. Otherwise, great vessel origins appear
patent. The left subclavian artery is encased by the left lung apex
mass.

Right carotid system: No evidence of dissection, stenosis (50% or
greater) or occlusion.

Left carotid system: No evidence of dissection, stenosis (50% or
greater) or occlusion. Mild narrowing of the common carotid artery
origin. Mild atherosclerosis at the carotid bifurcation. The
proximal common carotid artery abuts the anterior aspect of the left
lung apex mass.

Vertebral arteries: Left dominant. The left vertebral artery is
encased by the left lung apex tumor and approximately 40 50%
narrowed in this region. Otherwise, no significant stenosis in the
neck.

Skeleton: Partially imaged thoracic fusion hardware. There is
extensive invasion of the partially imaged upper thoracic spine by
the left upper lung mass.

Other neck: Normal thyroid.

Upper chest: Large mass involving the left lung apex, which invades
into the adjacent thoracic spine and mediastinum. The size and
overall extent of the tumor appears progressed since prior CT chest
from [DATE].

Review of the MIP images confirms the above findings

CTA HEAD FINDINGS

Anterior circulation: No significant proximal stenosis, large vessel
occlusion, aneurysm, or vascular malformation. Bilateral calcific
cavernous carotid artery atherosclerosis without evidence of greater
than 50% narrowing.

Posterior circulation: No significant proximal stenosis, large
vessel occlusion, aneurysm, or vascular malformation. The non
dominant right vertebral artery makes a small contribution to the
basilar artery.

Venous sinuses: As permitted by contrast timing, patent.

Review of the MIP images confirms the above findings
IMPRESSION: 1. No emergent large vessel occlusion.
2. Approximately 40-50% narrowing of the left vertebral artery
proximally where it is encased by the left lung apex tumor.
3. Partially imaged large left apical lung mass which invades into
the adjacent thoracic spine and mediastinum. The size and overall
extent of the tumor appears progressed since prior CT chest from
[DATE]. CT of the chest with contrast could further
characterize lung/mediastinal progression and MRI of the thoracic
spine with contrast could further characterize thoracic (including
canal) invasion if clinically indicated.

## 2020-02-05 IMAGING — CT CT ANGIO HEAD-NECK
1 of 11 series · 5 of 33 positions shown · IV contrast (APPLIED)
Comparison: CT head [DATE].  Same day MRI.

CLINICAL DATA: Neuro deficits, subacute.

EXAM:
CT ANGIOGRAPHY HEAD AND NECK
TECHNIQUE: Multidetector CT imaging of the head and neck was performed using
the standard protocol during bolus administration of intravenous
contrast. Multiplanar CT image reconstructions and MIPs were
obtained to evaluate the vascular anatomy. Carotid stenosis
measurements (when applicable) are obtained utilizing NASCET
criteria, using the distal internal carotid diameter as the
denominator.
CONTRAST:  75mL OMNIPAQUE IOHEXOL 350 MG/ML SOLN

[Series 14: ax thins · axial · 0.39mm/px · z∈[+1237,+1456]mm · 5 of 329 slices shown]
[im 55/329  soft-tissue]
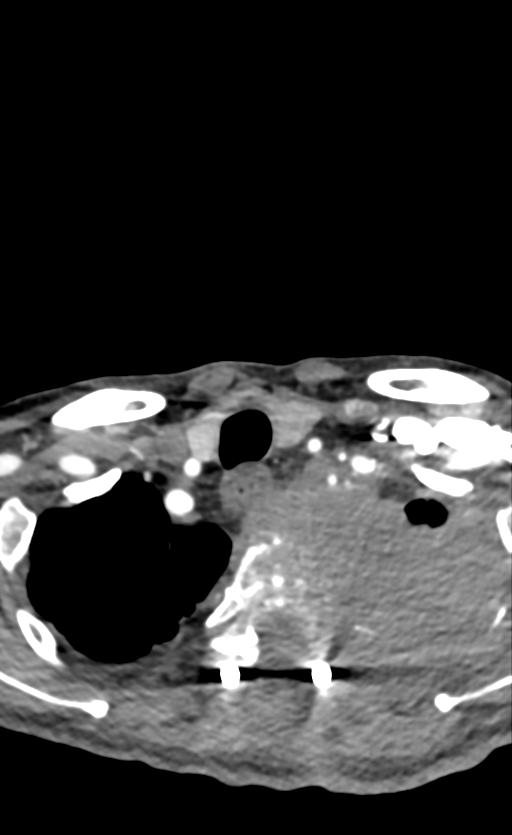
[im 110/329  bone]
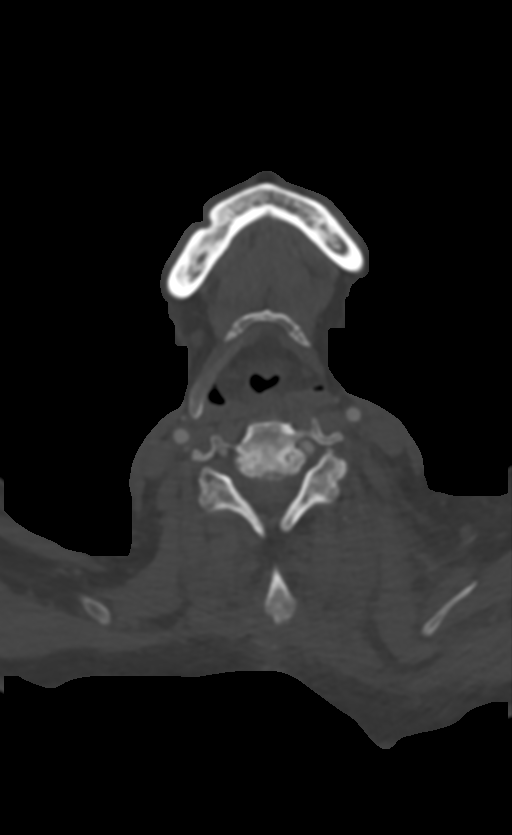
[im 165/329  soft-tissue]
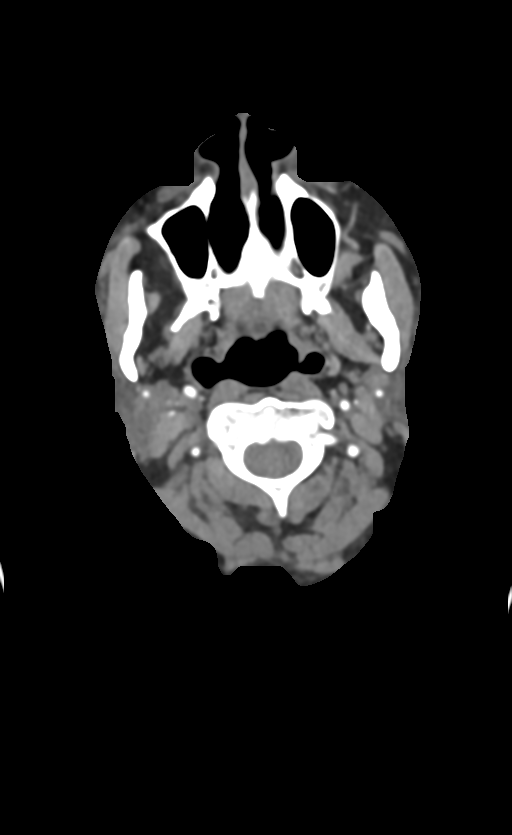
[im 219/329  bone]
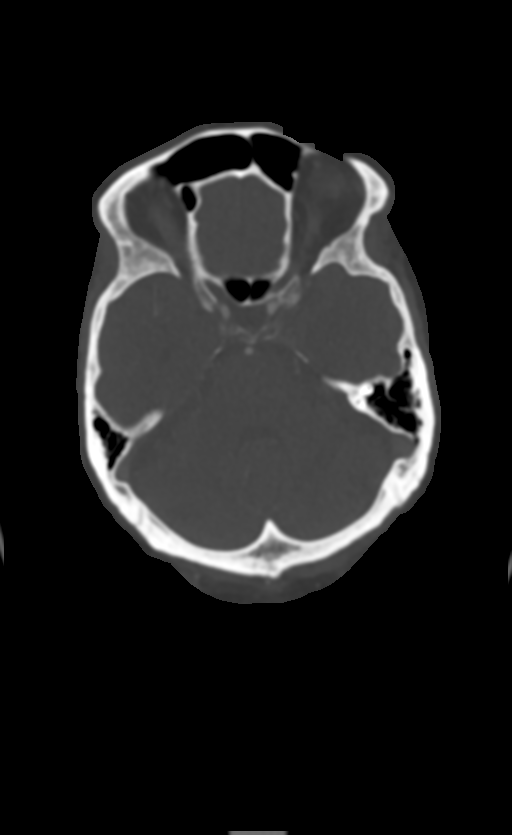
[im 274/329  soft-tissue]
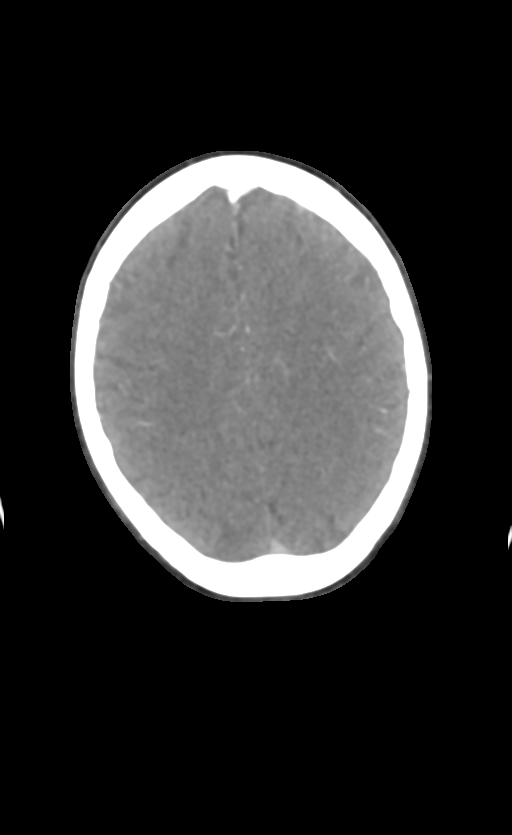

[5 of 33 positions shown; findings below may reference images not displayed]

FINDINGS: CT HEAD FINDINGS

Brain: No evidence of acute large vascular territory infarction,
hemorrhage, hydrocephalus, extra-axial collection or mass
lesion/mass effect. Similar patchy white matter hypoattenuation,
most likely related to chronic microvascular ischemic disease.

Vascular: Calcific atherosclerosis.

Skull: No acute fracture.

Sinuses: Sinuses are largely clear.  Unremarkable orbits.

Orbits: No mastoid effusions.

Review of the MIP images confirms the above findings

CTA NECK FINDINGS

Aortic arch: Atherosclerosis of the aorta. The right subclavian
artery origin is not imaged. Otherwise, great vessel origins appear
patent. The left subclavian artery is encased by the left lung apex
mass.

Right carotid system: No evidence of dissection, stenosis (50% or
greater) or occlusion.

Left carotid system: No evidence of dissection, stenosis (50% or
greater) or occlusion. Mild narrowing of the common carotid artery
origin. Mild atherosclerosis at the carotid bifurcation. The
proximal common carotid artery abuts the anterior aspect of the left
lung apex mass.

Vertebral arteries: Left dominant. The left vertebral artery is
encased by the left lung apex tumor and approximately 40 50%
narrowed in this region. Otherwise, no significant stenosis in the
neck.

Skeleton: Partially imaged thoracic fusion hardware. There is
extensive invasion of the partially imaged upper thoracic spine by
the left upper lung mass.

Other neck: Normal thyroid.

Upper chest: Large mass involving the left lung apex, which invades
into the adjacent thoracic spine and mediastinum. The size and
overall extent of the tumor appears progressed since prior CT chest
from [DATE].

Review of the MIP images confirms the above findings

CTA HEAD FINDINGS

Anterior circulation: No significant proximal stenosis, large vessel
occlusion, aneurysm, or vascular malformation. Bilateral calcific
cavernous carotid artery atherosclerosis without evidence of greater
than 50% narrowing.

Posterior circulation: No significant proximal stenosis, large
vessel occlusion, aneurysm, or vascular malformation. The non
dominant right vertebral artery makes a small contribution to the
basilar artery.

Venous sinuses: As permitted by contrast timing, patent.

Review of the MIP images confirms the above findings
IMPRESSION: 1. No emergent large vessel occlusion.
2. Approximately 40-50% narrowing of the left vertebral artery
proximally where it is encased by the left lung apex tumor.
3. Partially imaged large left apical lung mass which invades into
the adjacent thoracic spine and mediastinum. The size and overall
extent of the tumor appears progressed since prior CT chest from
[DATE]. CT of the chest with contrast could further
characterize lung/mediastinal progression and MRI of the thoracic
spine with contrast could further characterize thoracic (including
canal) invasion if clinically indicated.

## 2020-02-05 IMAGING — MR MR ABDOMEN WO/W CM
32 series · 48 of 48 positions shown · IV contrast (Gadavist)
Comparison: Abdominal CT [DATE].

CLINICAL DATA: Right renal mass on CT.  Left-sided lung cancer.

EXAM:
MRI ABDOMEN WITHOUT AND WITH CONTRAST
TECHNIQUE: Multiplanar multisequence MR imaging of the abdomen was performed
both before and after the administration of intravenous contrast.
CONTRAST:  6.5mL GADAVIST GADOBUTROL 1 MMOL/ML IV SOLN

[Series 6: cor haste · coronal · 6.0mm · 1.18mm/px · 1 of 30 slices shown (1 of 2)]
[im 1/30]
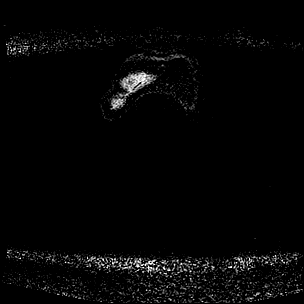

[Series 7: ax haste · axial · 5.0mm · 1.19mm/px · 1 of 38 slices shown]
[im 1/38]
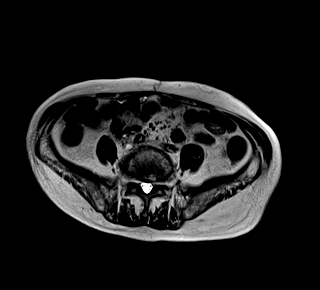

[Series 10: T2 fat-sat · axial · 5.0mm · 1.19mm/px · 1 of 40 slices shown]
[im 1/40]
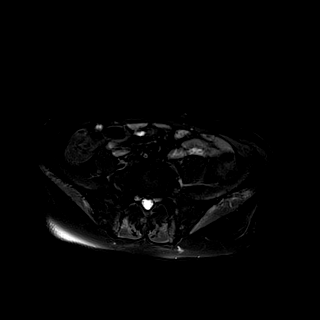

[Series 11: t1_vibe_opp-in_tra_p4_bh · axial · 3.0mm · 1.19mm/px · 1 of 80 slices shown (1 of 2)]
[im 1/80]
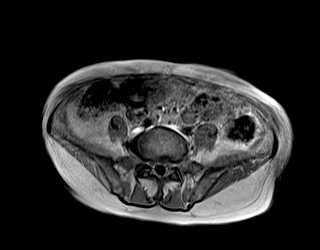

[Series 11: t1_vibe_opp-in_tra_p4_bh · axial · 3.0mm · 1.19mm/px · 1 of 80 slices shown (2 of 2)]
[im 1/80]
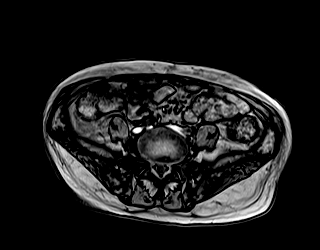

[Series 12: DWI · axial · 5.0mm · 1.42mm/px · 1 of 120 slices shown (1 of 2)]
[im 1/120]
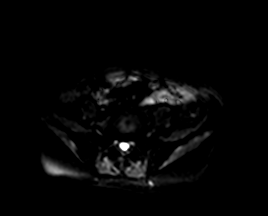

[Series 13: DWI · axial · 5.0mm · 1.42mm/px · 1 of 40 slices shown (2 of 2)]
[im 1/40]
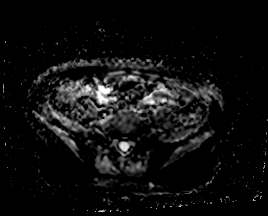

[Series 14: bSSFP · coronal · 6.0mm · 0.70mm/px · 1 of 28 slices shown (1 of 2)]
[im 1/28]
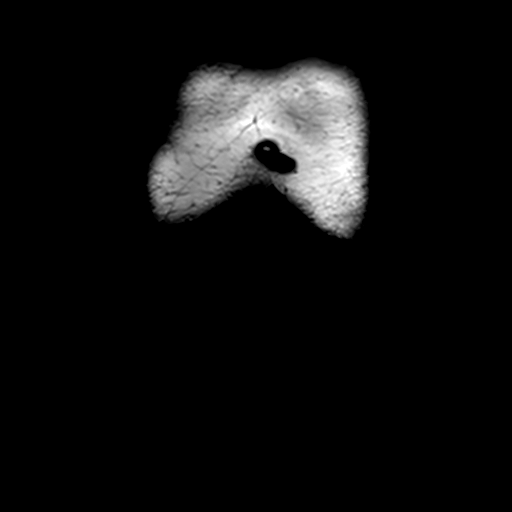

[Series 15: t1_vibe_fs_tra_p4_bh_pre · axial · 3.0mm · 1.19mm/px · 1 of 88 slices shown (1 of 3)]
[im 1/88]
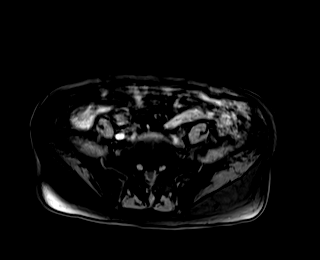

[Series 17: t1_vibe_fs_tra_p4_bh_pre · axial · 3.0mm · 1.19mm/px · 1 of 88 slices shown (2 of 3)]
[im 1/88]
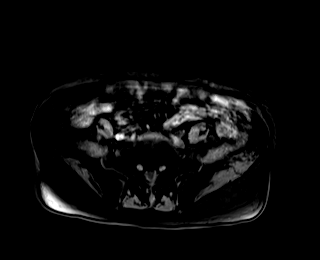

[Series 18: t1_vibe_fs_tra_p4_bh_pre_sub · axial · 3.0mm · 1.19mm/px · 1 of 88 slices shown]
[im 1/88]
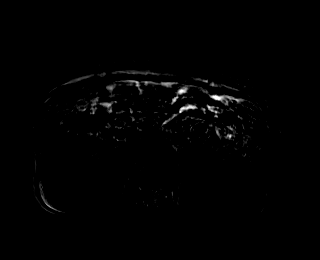

[Series 20: t1_vibe_fs_tra_p4_bh_post · axial · 3.0mm · 1.19mm/px · 1 of 88 slices shown (1 of 8)]
[im 1/88]
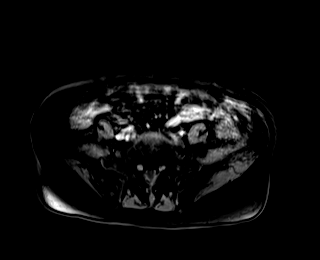

[Series 21: t1_vibe_fs_tra_p4_bh_post_sub · axial · 3.0mm · 1.19mm/px · z∈[-653,-393]mm · 2 of 88 slices shown (1 of 8)]
[im 1/88]
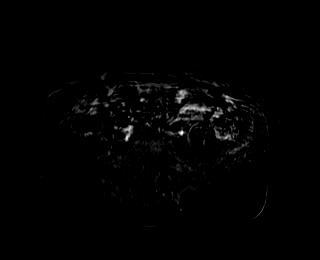
[im 88/88]
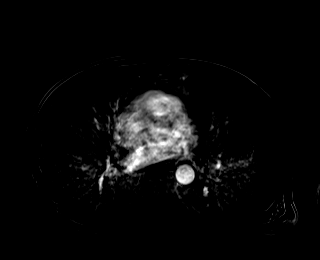

[Series 22: t1_vibe_fs_tra_p4_bh_post · axial · 3.0mm · 1.19mm/px · z∈[-653,-393]mm · 2 of 88 slices shown (2 of 8)]
[im 1/88]
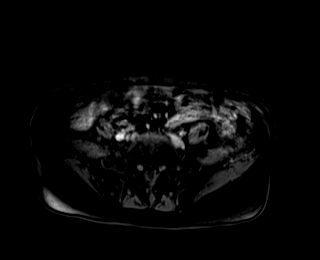
[im 88/88]
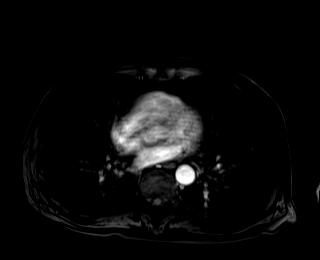

[Series 23: t1_vibe_fs_tra_p4_bh_post_sub · axial · 3.0mm · 1.19mm/px · z∈[-653,-393]mm · 2 of 88 slices shown (2 of 8)]
[im 1/88]
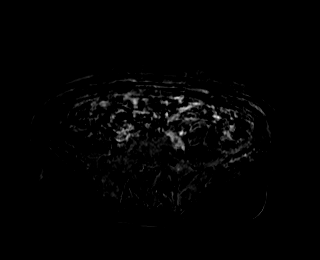
[im 88/88]
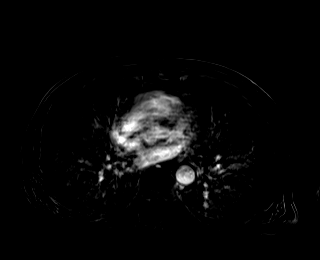

[Series 24: t1_vibe_fs_tra_p4_bh_post · axial · 3.0mm · 1.19mm/px · z∈[-653,-393]mm · 2 of 88 slices shown (3 of 8)]
[im 1/88]
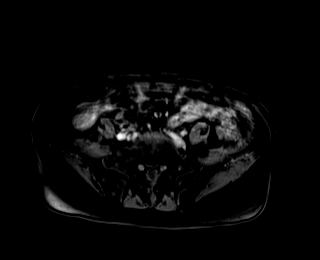
[im 88/88]
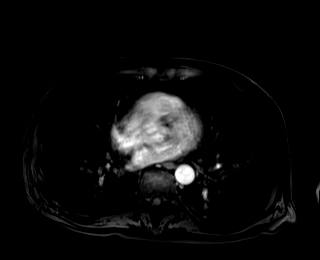

[Series 25: t1_vibe_fs_tra_p4_bh_post_sub · axial · 3.0mm · 1.19mm/px · z∈[-653,-393]mm · 2 of 88 slices shown (3 of 8)]
[im 1/88]
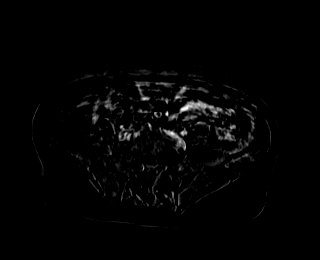
[im 88/88]
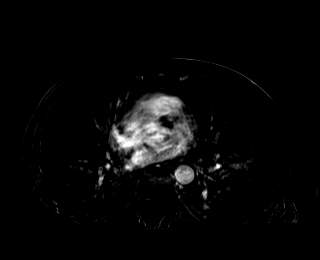

[Series 26: t1_vibe_fs_tra_p4_bh_post · axial · 3.0mm · 1.19mm/px · z∈[-653,-393]mm · 2 of 88 slices shown (4 of 8)]
[im 1/88]
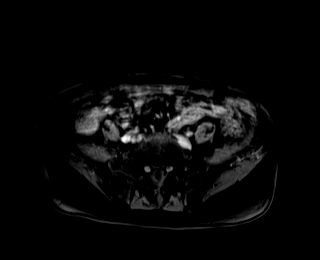
[im 88/88]
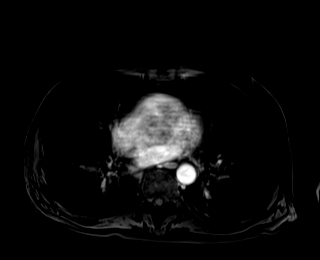

[Series 27: t1_vibe_fs_tra_p4_bh_post_sub · axial · 3.0mm · 1.19mm/px · z∈[-653,-393]mm · 2 of 88 slices shown (4 of 8)]
[im 1/88]
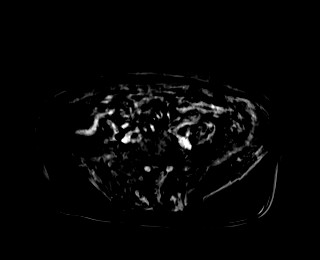
[im 88/88]
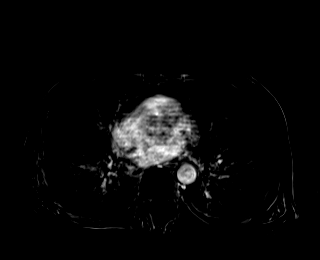

[Series 28: T1 dynamic post-contrast · coronal · 3.0mm · 1.31mm/px · 1 of 64 slices shown (1 of 2)]
[im 1/64]
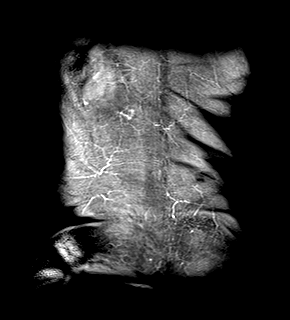

[Series 32: cor haste · coronal · 6.0mm · 1.19mm/px · 1 of 28 slices shown (2 of 2)]
[im 1/28]
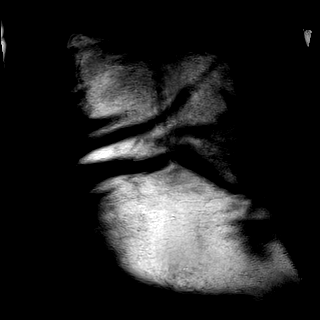

[Series 33: t1_vibe_fs_tra_p4_bh_pre · axial · 3.0mm · 1.19mm/px · z∈[-313,-77]mm · 2 of 80 slices shown (3 of 3)]
[im 1/80]
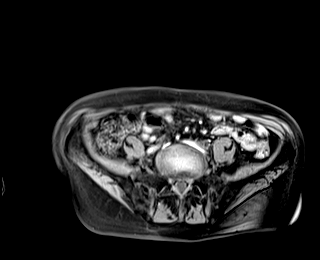
[im 80/80]
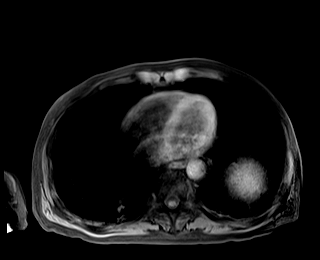

[Series 35: t1_vibe_fs_tra_p4_bh_post · axial · 3.0mm · 1.19mm/px · z∈[-313,-77]mm · 2 of 80 slices shown (5 of 8)]
[im 1/80]
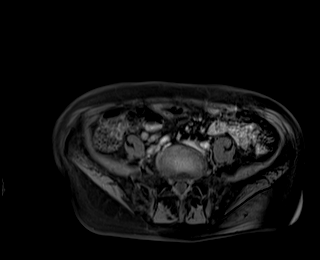
[im 80/80]
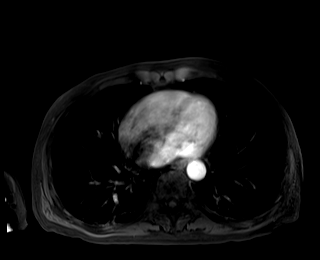

[Series 36: t1_vibe_fs_tra_p4_bh_post_sub · axial · 3.0mm · 1.19mm/px · z∈[-313,-77]mm · 2 of 80 slices shown (5 of 8)]
[im 1/80]
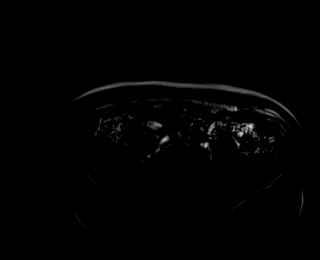
[im 80/80]
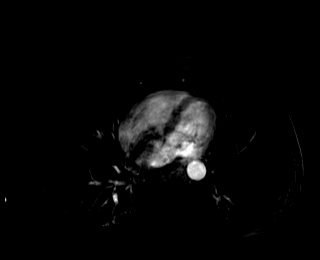

[Series 37: t1_vibe_fs_tra_p4_bh_post · axial · 3.0mm · 1.19mm/px · z∈[-313,-77]mm · 2 of 80 slices shown (6 of 8)]
[im 1/80]
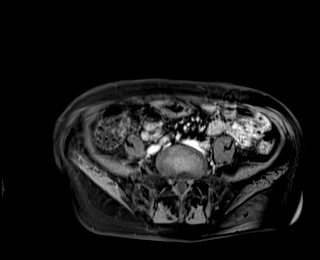
[im 80/80]
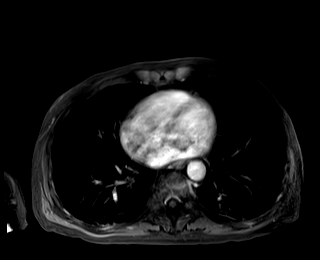

[Series 38: t1_vibe_fs_tra_p4_bh_post_sub · axial · 3.0mm · 1.19mm/px · z∈[-313,-77]mm · 2 of 80 slices shown (6 of 8)]
[im 1/80]
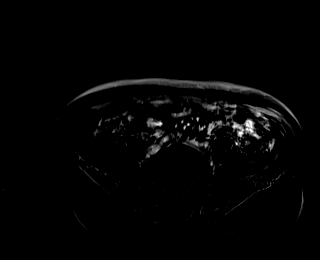
[im 80/80]
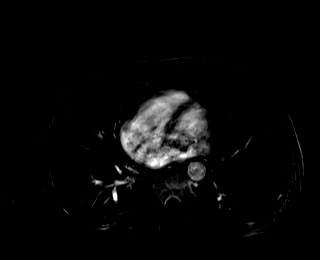

[Series 39: t1_vibe_fs_tra_p4_bh_post · axial · 3.0mm · 1.19mm/px · z∈[-313,-77]mm · 2 of 80 slices shown (7 of 8)]
[im 1/80]
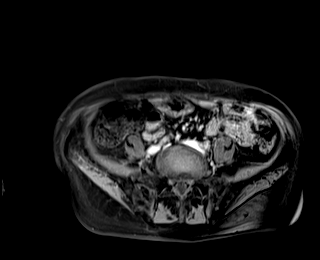
[im 80/80]
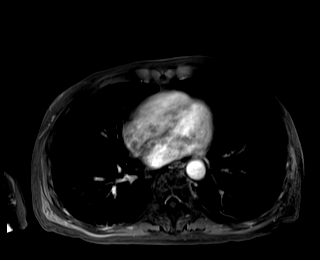

[Series 40: t1_vibe_fs_tra_p4_bh_post_sub · axial · 3.0mm · 1.19mm/px · z∈[-313,-77]mm · 2 of 80 slices shown (7 of 8)]
[im 1/80]
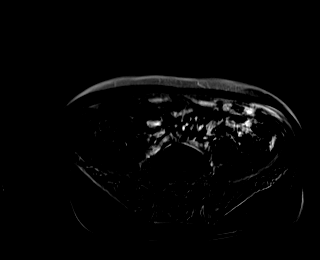
[im 80/80]
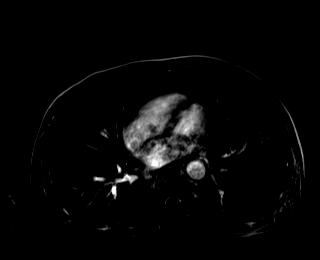

[Series 41: t1_vibe_fs_tra_p4_bh_post · axial · 3.0mm · 1.19mm/px · z∈[-313,-77]mm · 2 of 80 slices shown (8 of 8)]
[im 1/80]
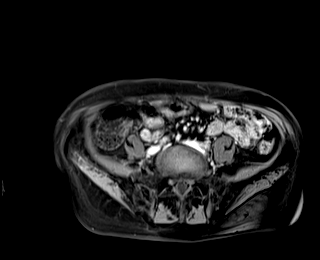
[im 80/80]
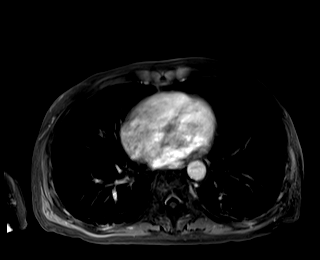

[Series 42: t1_vibe_fs_tra_p4_bh_post_sub · axial · 3.0mm · 1.19mm/px · z∈[-313,-77]mm · 2 of 80 slices shown (8 of 8)]
[im 1/80]
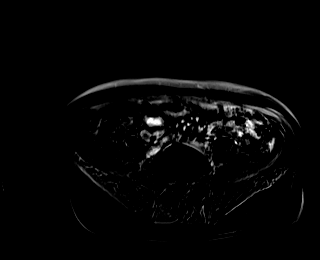
[im 80/80]
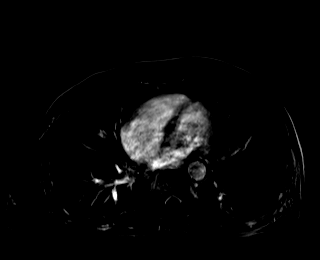

[Series 43: T1 dynamic post-contrast · coronal · 3.0mm · 1.19mm/px · 1 of 64 slices shown (2 of 2)]
[im 1/64]
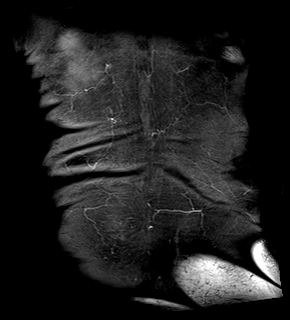

[Series 100: bSSFP · axial · 5.0mm · 0.74mm/px · 1 of 40 slices shown (2 of 2)]
[im 1/40]
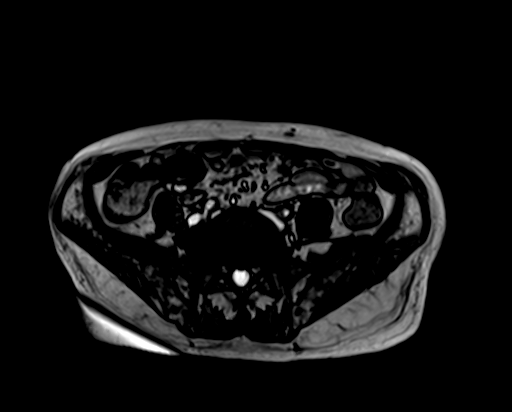

[48 of 48 positions shown; findings below may reference images not displayed]

FINDINGS: Portions of exam are mildly motion degraded.

Lower chest: Normal heart size without pericardial or pleural
effusion.

Hepatobiliary: Normal liver. Normal gallbladder, without biliary
ductal dilatation.

Pancreas: The pancreatic duct is upper normal for age. No mass or
acute inflammation.

Spleen:  Normal in size, without focal abnormality.

Adrenals/Urinary Tract: Left greater than right adrenal thickening.
Bilateral simple renal cysts.

Corresponding to the CT abnormality, within the anterior lower pole
right kidney 7.5 x 7.5 by 7.7 cm mass which demonstrates precontrast
T2 hypointensity and T1 hyperintensity. No post-contrast
enhancement, including on subtracted images.

No hydronephrosis.

Stomach/Bowel: Normal stomach and small bowel. Colonic stool burden
suggests constipation.

Vascular/Lymphatic: Aortic atherosclerosis. No retroperitoneal or
retrocrural adenopathy.

Other:  No ascites.

Musculoskeletal: No acute osseous abnormality.
IMPRESSION: 1. Mildly motion degraded exam.
2. Dominant lower pole right renal mass is consistent with a
hemorrhagic/proteinaceous cyst.
3. No acute process or evidence of metastatic disease in the
abdomen.
4.  Aortic Atherosclerosis ([CR]-[CR]).

## 2020-02-05 IMAGING — MR MR HEAD WO/W CM
12 of 14 series · 40 of 48 positions shown · IV contrast (gadavist)
Comparison: CT head from the same day

CLINICAL DATA: Stroke suspected.  Vision loss.

EXAM:
MRI HEAD WITHOUT AND WITH CONTRAST
TECHNIQUE: Multiplanar, multiecho pulse sequences of the brain and surrounding
structures were obtained without and with intravenous contrast.
CONTRAST:  6.5mL GADAVIST GADOBUTROL 1 MMOL/ML IV SOLN

[Series 5: DWI · axial · 3.0mm · 0.88mm/px · z∈[-102,+38]mm · 7 of 96 slices shown (1 of 4)]
[im 1/96]
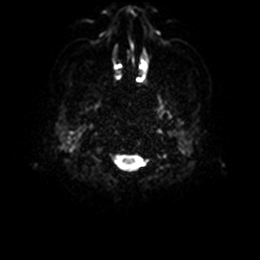
[im 16/96]
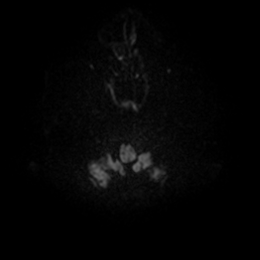
[im 32/96]
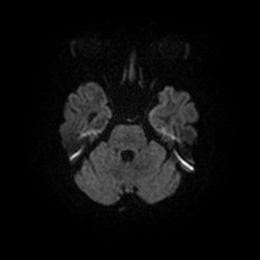
[im 48/96]
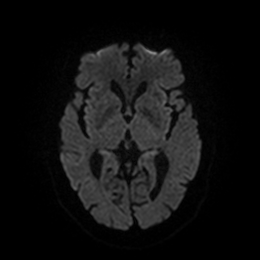
[im 64/96]
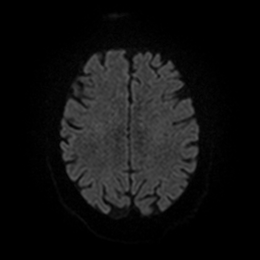
[im 80/96]
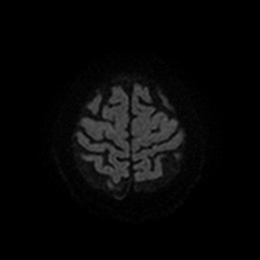
[im 96/96]
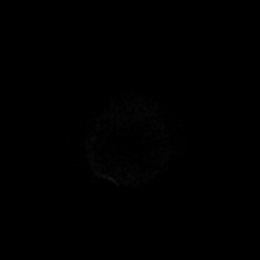

[Series 6: DWI · axial · 3.0mm · 0.88mm/px · z∈[-102,+38]mm · 4 of 48 slices shown (2 of 4)]
[im 1/48]
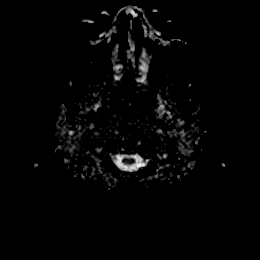
[im 16/48]
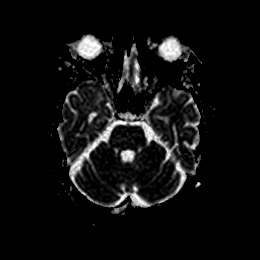
[im 32/48]
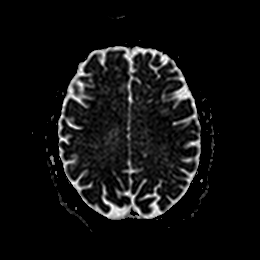
[im 48/48]
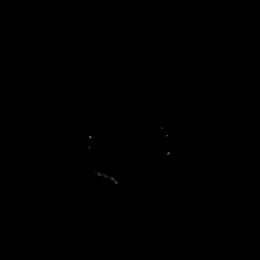

[Series 7: DWI · coronal · 4.0mm · 0.88mm/px · 6 of 72 slices shown (3 of 4)]
[im 1/72]
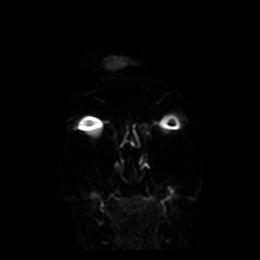
[im 15/72]
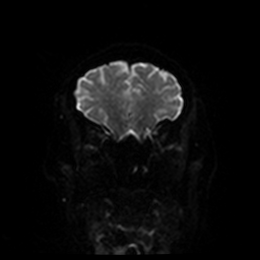
[im 29/72]
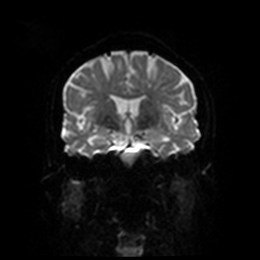
[im 43/72]
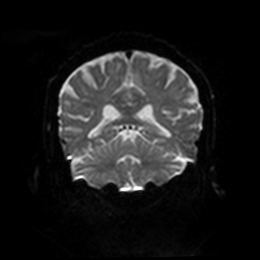
[im 57/72]
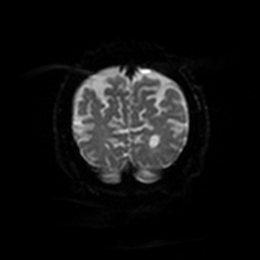
[im 72/72]
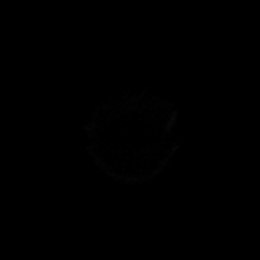

[Series 8: DWI · coronal · 4.0mm · 0.88mm/px · 3 of 36 slices shown (4 of 4)]
[im 1/36]
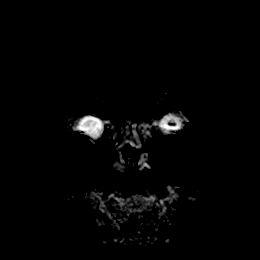
[im 18/36]
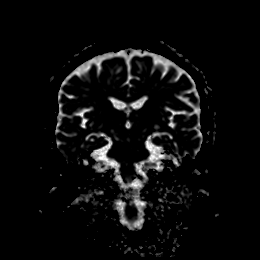
[im 36/36]
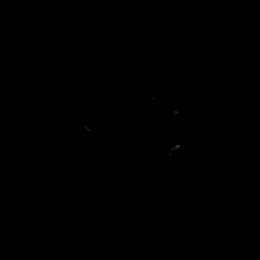

[Series 10: pha_images · axial · 3.0mm · 0.90mm/px · z∈[-108,+38]mm · 4 of 49 slices shown]
[im 1/49]
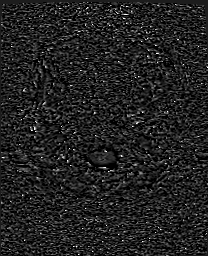
[im 17/49]
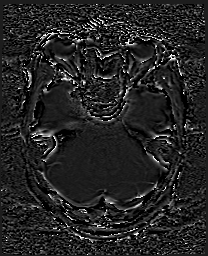
[im 33/49]
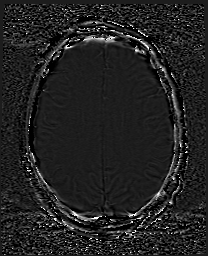
[im 49/49]
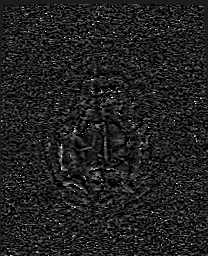

[Series 11: swi_images · axial · 3.0mm · 0.90mm/px · z∈[-108,+44]mm · 4 of 52 slices shown]
[im 1/52]
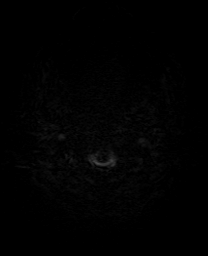
[im 18/52]
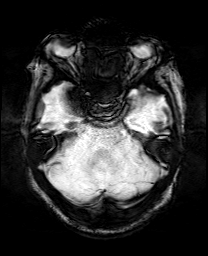
[im 35/52]
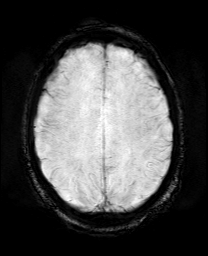
[im 52/52]
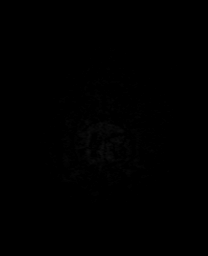

[Series 13: FLAIR · axial · 5.0mm · 0.45mm/px · z∈[-103,+40]mm · 2 of 25 slices shown]
[im 1/25]
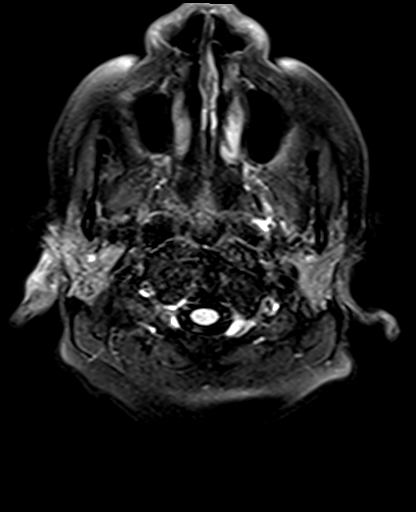
[im 25/25]
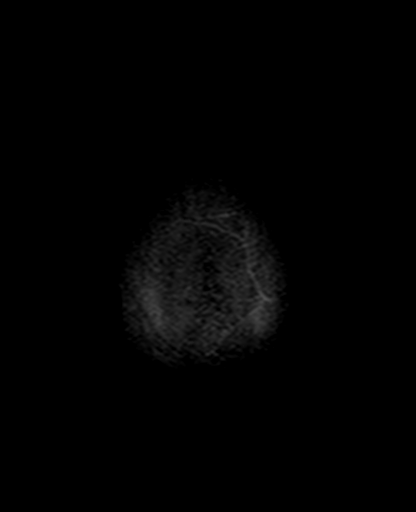

[Series 14: T1 · sagittal · 5.0mm · 0.75mm/px · 2 of 25 slices shown (1 of 2)]
[im 1/25]
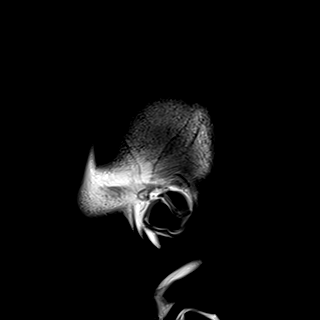
[im 25/25]
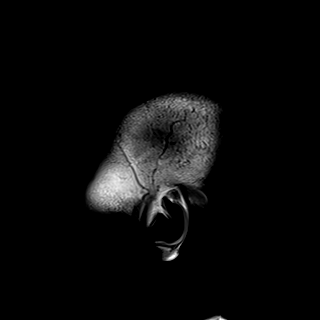

[Series 15: T2 · axial · 5.0mm · 0.72mm/px · z∈[-104,+40]mm · 2 of 25 slices shown]
[im 1/25]
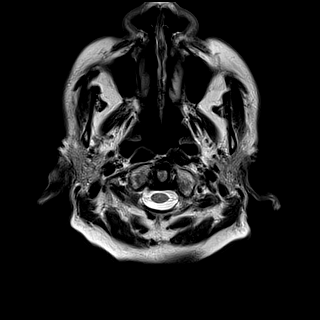
[im 25/25]
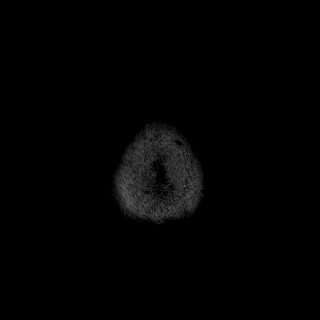

[Series 18: T2 post-contrast · coronal · 5.0mm · 0.72mm/px · 2 of 30 slices shown]
[im 1/30]
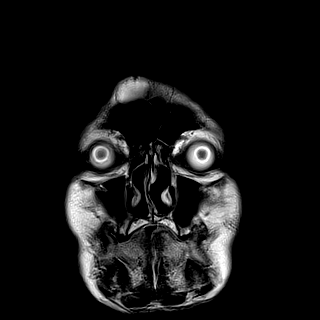
[im 30/30]
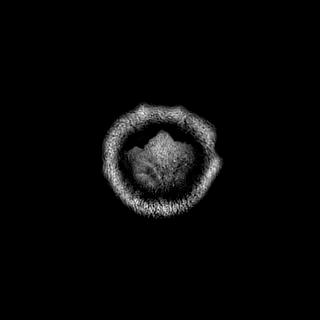

[Series 20: T1 post-contrast · coronal · 5.0mm · 0.34mm/px · 2 of 30 slices shown]
[im 1/30]
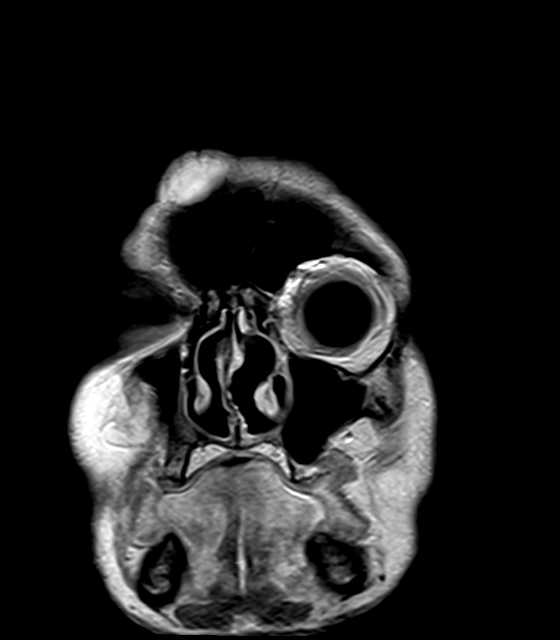
[im 30/30]
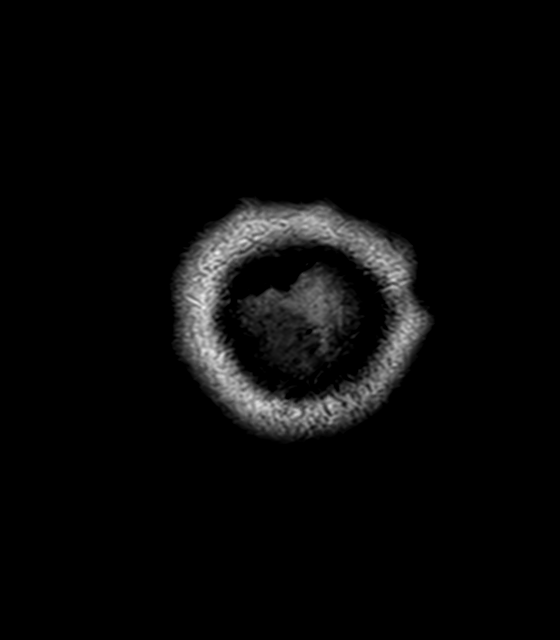

[Series 21: T1 · sagittal · 5.0mm · 0.75mm/px · 2 of 25 slices shown (2 of 2)]
[im 1/25]
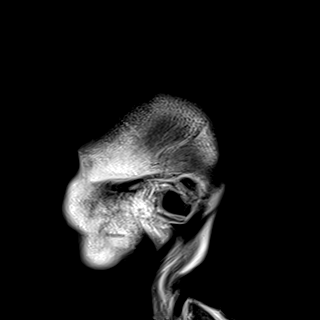
[im 25/25]
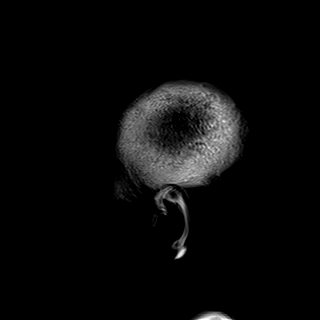

[40 of 48 positions shown; findings below may reference images not displayed]

FINDINGS: Brain: No acute infarction, hemorrhage, hydrocephalus, extra-axial
collection or mass lesion. Minimal T2/FLAIR hyperintensities within
the white matter, compatible with age-appropriate chronic
microvascular ischemic disease. No abnormal enhancement.

Vascular: Major arterial flow voids are maintained at the skull
base.

Skull and upper cervical spine: Normal marrow signal. Right frontal
scalp lipoma.

Sinuses/Orbits: No substantial paranasal sinus disease. Unremarkable
orbits.

Other: No mastoid effusions.
IMPRESSION: No acute abnormality.  Specifically, no acute infarct.

## 2020-02-05 IMAGING — RF DG UGI W SINGLE CM
7 of 8 series · 12 of 24 positions shown · IV contrast (omnipaque)
Comparison: CT scan [DATE].

CLINICAL DATA: Abnormal CT.

EXAM:
WATER SOLUBLE UPPER GI SERIES
TECHNIQUE: Single-column upper GI series was performed using water soluble
contrast.
CONTRAST:  50mL OMNIPAQUE IOHEXOL 300 MG/ML  SOLN

[Series 2: cp_standard · 0.54mm/px · 2 of 92 frames shown (1 of 6)]
[frame 14/92]
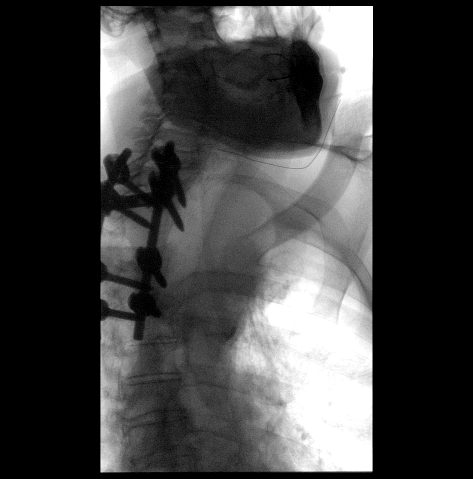
[frame 50/92]
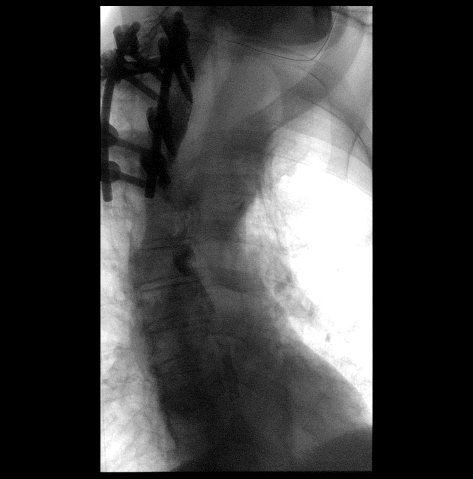

[Series 3: cp_standard · 0.56mm/px · 2 of 24 frames shown (2 of 6)]
[frame 1/24]
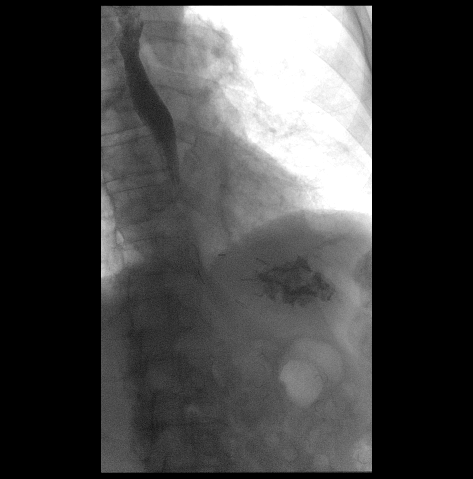
[frame 13/24]
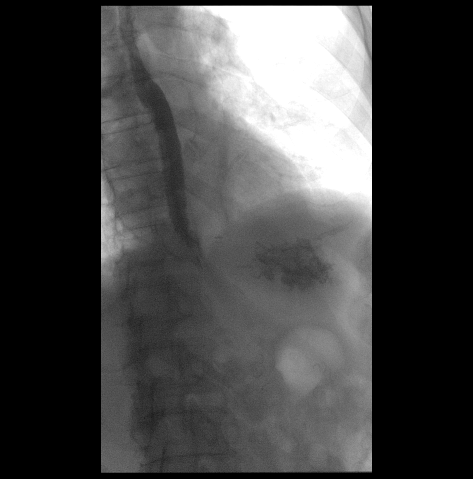

[Series 4: cp_standard · 0.56mm/px · 2 of 55 frames shown (3 of 6)]
[frame 9/55]
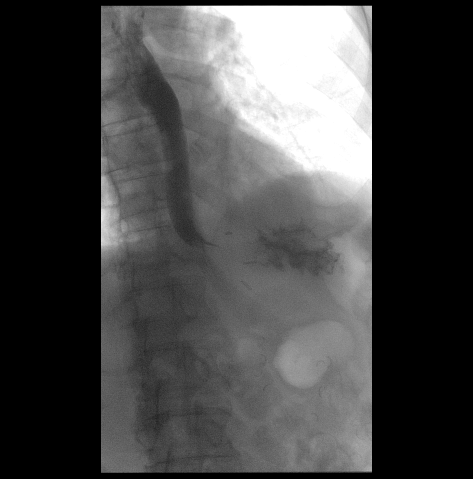
[frame 31/55]
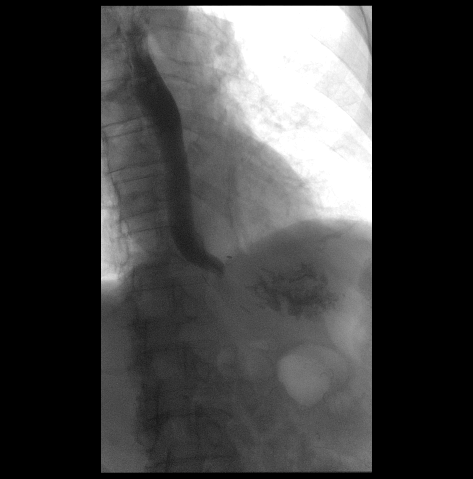

[Series 5: cp_standard · 0.56mm/px · 2 of 40 frames shown (4 of 6)]
[frame 8/40]
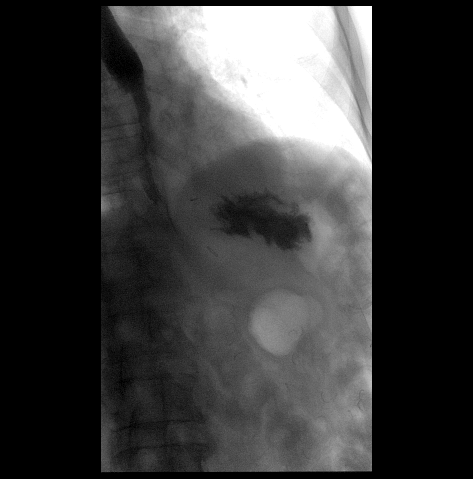
[frame 35/40]
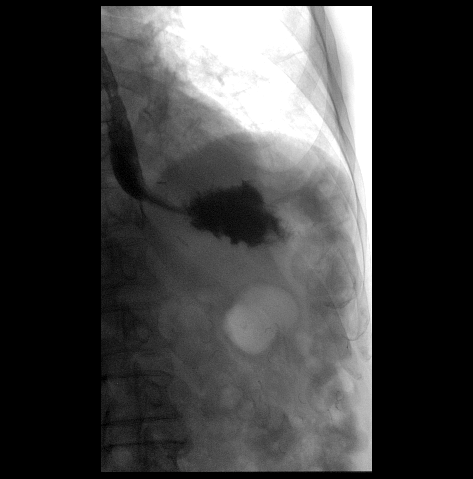

[Series 6: cp_standard · 0.55mm/px · 2 of 43 frames shown (5 of 6)]
[frame 22/43]
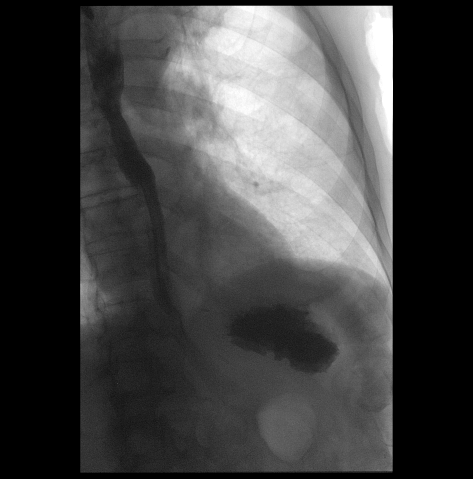
[frame 37/43]
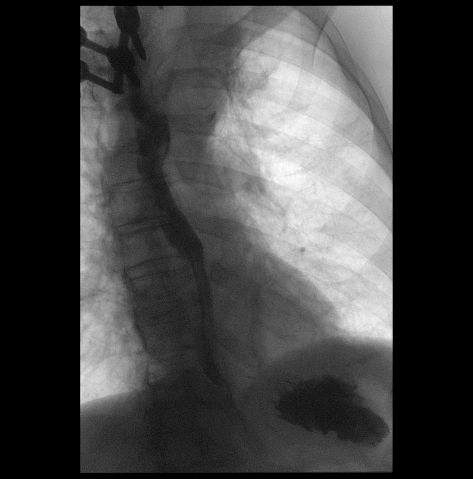

[Series 7: cp_standard · 0.55mm/px · 1 of 38 frames shown (6 of 6)]
[frame 20/38]
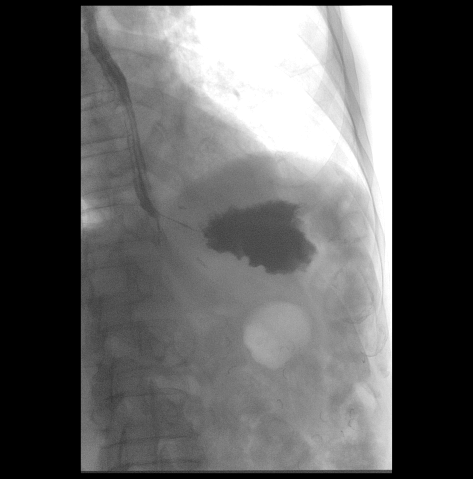

[Series 8: fluoro_iodine_singleshot_bw · 0.19mm/px · 1 of 1 slices shown]
[im 1/1]
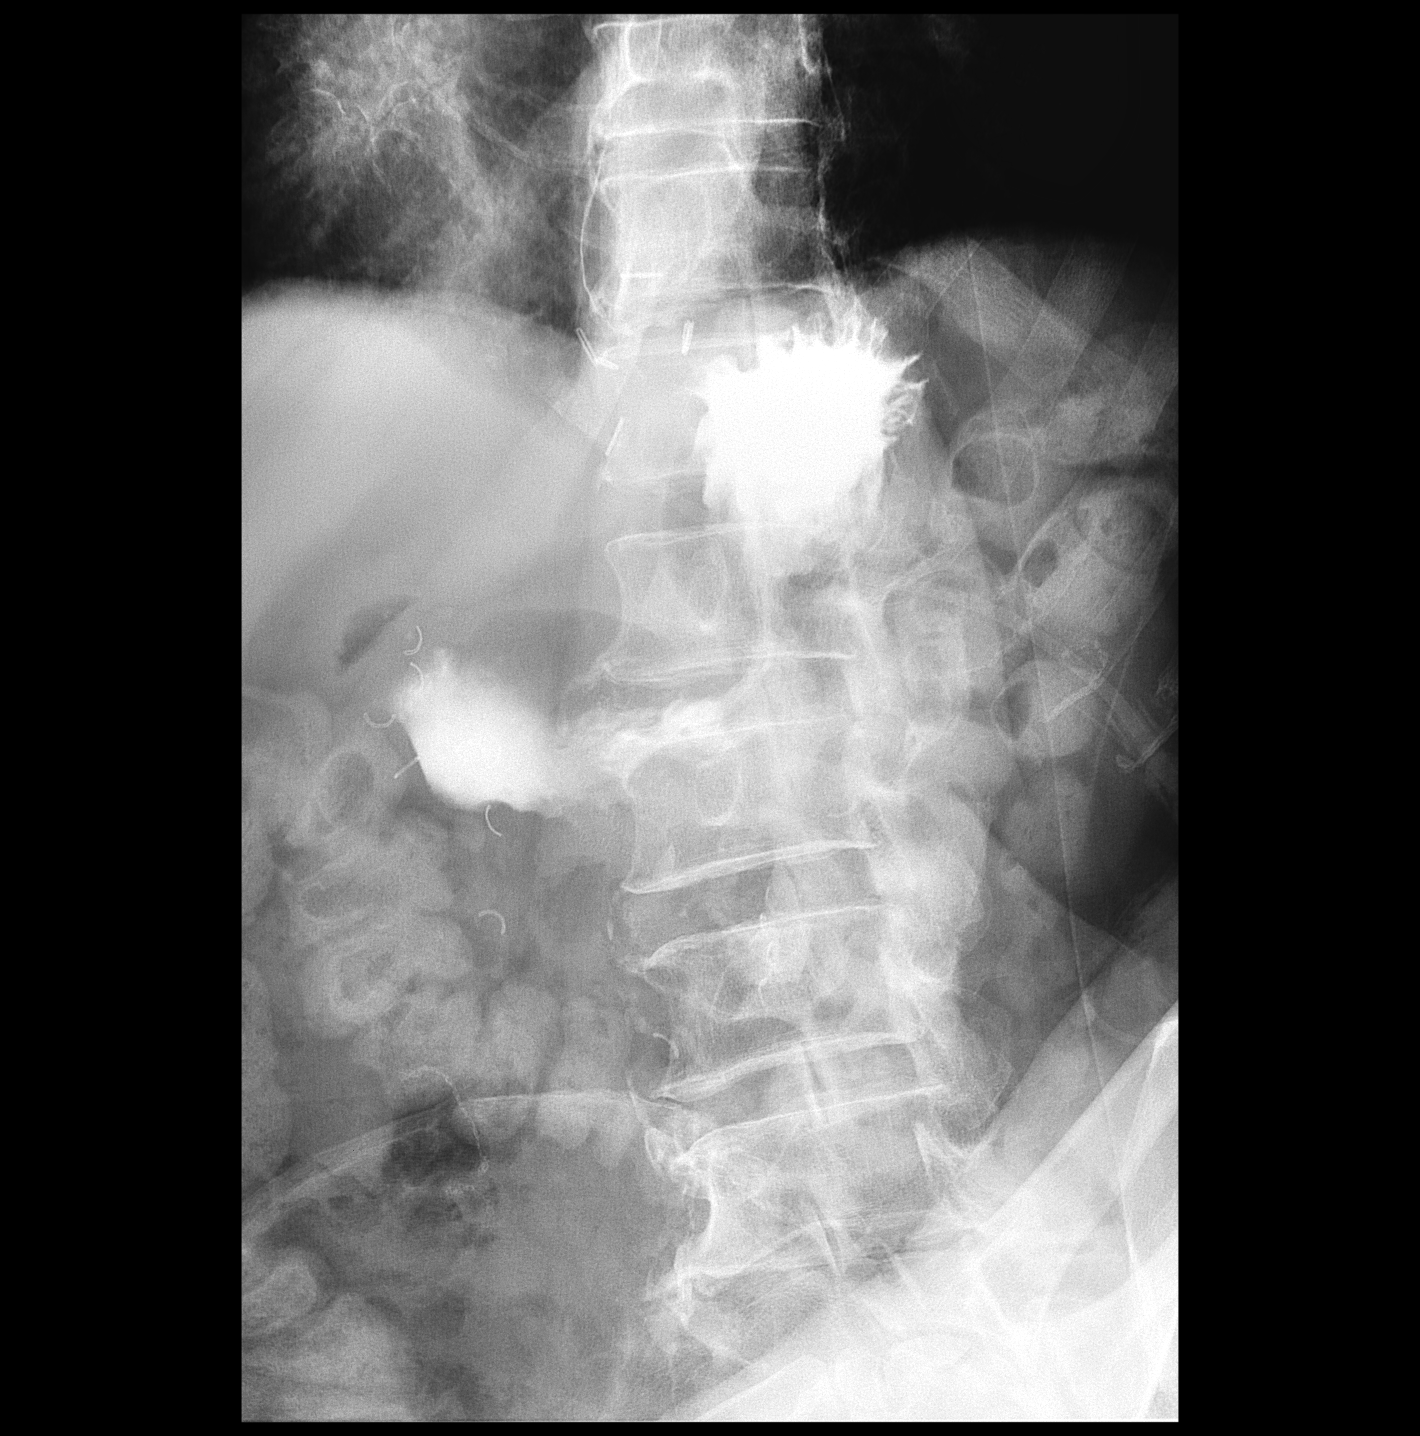

[12 of 24 positions shown; findings below may reference images not displayed]

FLUOROSCOPY TIME:  Fluoroscopy Time:  2 minutes and 48 seconds.

Radiation Exposure Index (if provided by the fluoroscopic device):
14.2 mGy

Number of Acquired Spot Images: 0
FINDINGS: Study is markedly limited by difficulty with patient positioning and
piecemeal swallowing. Within this limitation, there is no gross
esophageal abnormality. Surgical clips in the region of the
esophagogastric junction or probably related to prior
fundoplication. Contrast moves freely from the esophagus into the
stomach although there is poor esophageal peristalsis.

Fluoro table was pushes in 30 degrees head up in the patient was
rotated in both obliques, but the gastrojejunostomy seen on CT scan
earlier today is anterior and the Roux limb could not be opacified
given limited stomach filling and inability to position the patient
upright.
IMPRESSION: 1. No evidence for obstruction at the gastroesophageal junction.
2. Roux limb could not be opacified due to difficulty with patient
drinking contrast and inability to position upright. Repeat upper GI
series could be performed after patient recovers from acute symptoms
(preferably as an outpatient) and is better able to participate with
drinking contrast and upright positioning.

## 2020-02-05 MED ORDER — IOHEXOL 300 MG/ML  SOLN
50.0000 mL | Freq: Once | INTRAMUSCULAR | Status: AC | PRN
  Administered 2020-02-05: 50 mL via ORAL

## 2020-02-05 MED ORDER — GADOBUTROL 1 MMOL/ML IV SOLN
6.5000 mL | Freq: Once | INTRAVENOUS | Status: AC | PRN
  Administered 2020-02-05: 6.5 mL via INTRAVENOUS

## 2020-02-05 MED ORDER — IOHEXOL 350 MG/ML SOLN
75.0000 mL | Freq: Once | INTRAVENOUS | Status: AC | PRN
  Administered 2020-02-05: 75 mL via INTRAVENOUS

## 2020-02-05 NOTE — Progress Notes (Signed)
Physical Therapy Session Note  Patient Details  Name: Gregory Bautista MRN: [id number] Date of Birth: [birth year]  Today's Date: 02/05/2020 PT Individual Time: 1020-1100 PT Individual Time Calculation (min): 40 min   Short Term Goals: Week 4:  PT Short Term Goal 1 (Week 4): =LTG due to ELOS  Skilled Therapeutic Interventions/Progress Updates: Pt presented in w/c agreeable to therapy. BP checked at start of session with TED hose, ace bandages, and ab binder vitals checked BP 117/76 (91) HR 109. Pt transported to day room and performed ambulatory transfer ~14ft to high/low mat with CGA and +1 for equipment management. Pt required verbal cues for direction due to increased vision difficulties. Pt participated in alternating toe taps to 4 in step x 10 bilaterally indicating no s/s of hypotension and able to maintain fair balance. During seated breaks pt appreciated listening to music provided by Lattie Haw, RT. Pt was able to ambulate ~54ft with RW and CGA to w/c. Pt transported back to room and nsg present indicating that pt needs to return to bed to go for imaging. Pt performed ambulatory transfer to bed with RW and CGA. Pt then handed off to nsg to complete transfer into bed.      Therapy Documentation Precautions:  Precautions Precautions: Back,Fall Precaution Comments: reviewed precautions Restrictions Weight Bearing Restrictions: No General:   Vital Signs: Therapy Vitals Temp: 97.8 F (36.6 C) Pulse Rate: 98 Resp: 18 BP: 112/67 Patient Position (if appropriate): Lying Oxygen Therapy SpO2: 92 % O2 Device: Room Air O2 Flow Rate (L/min): 1.5 L/min  Therapy/Group: Individual Therapy  Loyalty Brashier  Khyron Garno, PTA  02/05/2020, 12:30 PM

## 2020-02-05 NOTE — Progress Notes (Signed)
Patient ID: Gregory Bautista, male   DOB: 05/19/1940, 80 y.o.   MRN: [id number]  SW spoke with [name] [phone]) to schedule transportation for next week with regard to his Radiation Oncology Treatment at [facility], [place].   [name], MSW, [place] Office: [phone] Cell: [phone] Fax: [phone]

## 2020-02-05 NOTE — Progress Notes (Signed)
This chaplain is present for F/U spiritual care.  The Pt. is awake and laying in bed after the healthcare assists in personal hygiene.    The Pt. presents himself with a flat affect today and responds appropriately within his reoccurring  themes of family and spirituality.   The Pt. is aware of his afternoon radiation and lunch schedule, agrees to a nap before.  This chaplain is available for F/U spiritual care as needed.

## 2020-02-05 NOTE — Progress Notes (Signed)
[facility] PHYSICAL MEDICINE & REHABILITATION PROGRESS NOTE   Subjective/Complaints: Appreciate Ascencion Dike discussing CT results with [place] and reviewing with Dr. Pascal Lux. Gastrojejunal anastomosis present and upper GI series recommended.  Gregory Bautista has no specific complaints, asks whether he has radiation today and informed he does at 2pm.   ROS:  .Limited by cognition  Objective:   CT ABDOMEN WO CONTRAST  Result Date: 02/05/2020 CLINICAL DATA:  Evaluate gastric anatomy prior to potential percutaneous gastrostomy placement. EXAM: CT ABDOMEN WITHOUT CONTRAST TECHNIQUE: Multidetector CT imaging of the abdomen was performed following the standard protocol without IV contrast. COMPARISON:  Chest CT-12/04/2019 FINDINGS: The lack of intravenous contrast limits the ability to evaluate solid abdominal organs. Lower chest: Limited visualization of the lower thorax demonstrates minimal dependent subpleural ground-glass atelectasis. No discrete focal airspace opacities. No pleural effusion Normal heart size. No pericardial effusion. There is mild decreased attenuation of the intra cardiac blood pool suggestive of anemia. Hepatobiliary: Normal hepatic contour. Normal noncontrast appearance of the gallbladder given degree of distention. No radiopaque gallstones. No ascites. Pancreas: Normal noncontrast appearance of the pancreas. Spleen: Normal noncontrast appearance of the spleen. Adrenals/Urinary Tract: There is a large (approximately 8.5 x 7.9 x 7.6 cm peripherally calcified mixed attenuating mass arising from the anterior aspect of the interpolar aspect of the right kidney (axial image 31, series 3; coronal image 50, series 6). Additional punctate (sub 1.1 cm) hypoattenuating renal lesions are seen bilaterally, incompletely evaluated though favored to represent renal cysts. Note is made of an approximately 0.8 x 0.6 cm nonobstructing stone within the right renal pelvis (axial image 25, series 3; coronal image  62, series 6). There is a punctate (approximately 2 mm) nonobstructing stone within the anterior interpolar aspect the left kidney (axial image 25, series 3). No definitive renal stones are seen along the imaged course of the superior aspect of the bilateral ureters. Very minimal amount of symmetric bilateral perinephric stranding. No evidence of urinary obstruction. There is mild thickening of the lateral limb of the left adrenal gland without discrete nodule. Normal noncontrast appearance of the right adrenal gland. The urinary bladder was not imaged. Stomach/Bowel: Postoperative change of the GE junction as well as the anterior aspect of the gastric antrum potentially the sequela of previous gastrojejunostomy creation though a distal anastomosis was not imaged. Ingested enteric contrast is seen within the colon. No evidence of enteric obstruction. The anterior wall of the gastric antrum is otherwise well apposed against the ventral wall of the upper abdomen without interposition either the colon or liver (axial image 20, series 3). No pneumoperitoneum, pneumatosis or portal venous gas. Vascular/Lymphatic: Atherosclerotic plaque within a normal caliber abdominal aorta. No bulky retroperitoneal or mesenteric lymphadenopathy on this noncontrast examination. Other: Dermal calcification overlies the ventral aspect of the upper abdomen. Regional soft tissues appear otherwise normal. Musculoskeletal: No acute or aggressive osseous abnormalities. Mild-to-moderate multilevel lumbar spine DDD, worse at L3-L4 and L4-L5 with disc space height loss, endplate irregularity and small posteriorly directed disc osteophyte complexes at these locations. IMPRESSION: 1. Postoperative change of the GE junction as well as the anterior aspect of the gastric antrum which may represent the sequela of previous gastrojejunostomy. Correlation with previous operative reports is advised as if the patient is post gastrojejunostomy creation he  would NOT be a candidate for percutaneous gastrostomy tube placement. If operative reports are not available, further evaluation with upper GI series could be performed as indicated. 2. Gastric anatomy otherwise amenable to percutaneous gastrostomy tube placement as indicated.  3. Incidentally noted approximately 8.5 cm mixed attenuating mass arising from the anterior aspect of the interpolar aspect of the right kidney - while potentially representative of a complex renal cyst, further evaluation with contrast-enhanced abdominal MRI is advised. 4. Nonobstructing bilateral nephrolithiasis. 5. Aortic Atherosclerosis (ICD10-I70.0). Electronically Signed   By: Sandi Mariscal M.D.   On: 02/05/2020 08:09   CT HEAD WO CONTRAST  Result Date: 02/04/2020 CLINICAL DATA:  Acute loss of vision right eye. History of lung carcinoma EXAM: CT HEAD WITHOUT CONTRAST TECHNIQUE: Contiguous axial images were obtained from the base of the skull through the vertex without intravenous contrast. COMPARISON:  None. FINDINGS: Brain: There is mild age related volume loss. There is no intracranial mass, hemorrhage, extra-axial fluid collection, or midline shift. Decreased attenuation is noted in the anterior limb of each external capsule, likely due to prior lacunar type infarcts in these areas. No acute appearing infarct is evident. Vascular: No hyperdense vessel. There is calcification in each carotid siphon region. Skull: Bony calvarium appears intact. Sinuses/Orbits: There is mucosal thickening in several ethmoid air cells. Other visualized paranasal sinuses are clear. Orbits appear symmetric bilaterally. Other: Mastoid air cells are clear. 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 No acute appearing infarct is evident. No mass or hemorrhage. There are foci of arterial vascular calcification. There is mucosal thickening in several ethmoid air cells. Electronically Signed   By: Lowella Grip  III M.D.   On: 02/04/2020 12:54   Recent Labs    02/04/20 1132 02/05/20 0520  WBC 6.2 5.5  HGB 11.4* 10.5*  HCT 37.0* 32.6*  PLT 268 234   Recent Labs    02/04/20 1132 02/05/20 0520  NA 140 141  K 3.8 3.7  CL 100 102  CO2 27 29  GLUCOSE 119* 131*  BUN 21 18  CREATININE 0.80 0.78  CALCIUM 10.5* 10.3    Intake/Output Summary (Last 24 hours) at 02/05/2020 1034 Last data filed at 02/05/2020 0527 Gross per 24 hour  Intake 157.93 ml  Output 1057 ml  Net -899.07 ml    Physical Exam: Vital Signs Blood pressure 105/70, pulse (!) 120, temperature 99.1 F (37.3 C), resp. rate 20, height 5\' 9"  (1.753 m), weight 63.9 kg, SpO2 93 %. Gen: no distress, normal appearing HEENT: oral mucosa pink and moist, NCAT Cardio: Reg rate Chest: normal effort, normal rate of breathing Abd: soft, non-distended Ext: no clubbing, cyanosis, or edema- very tight in L>R upper traps- also bones more prominent -losing weight Psych: upset/aggravated- says we are trying to make him crazy".  Skin: Warm and dry.  Intact. Musc: No edema in extremities.  No tenderness in extremities. Neuro: very confused- not even sure he was in hospital/month/year.  Motor: Bilateral upper extremities: Grossly 5/5 proximal distal Bilateral lower extremities: Grossly 4+/5 proximal to distal, stable  Assessment/Plan: 1. Functional deficits which require 3+ hours per day of interdisciplinary therapy in a comprehensive inpatient rehab setting.  Physiatrist is providing close team supervision and 24 hour management of active medical problems listed below.  Physiatrist and rehab team continue to assess barriers to discharge/monitor patient progress toward functional and medical goals  Care Tool:  Bathing    Body parts bathed by patient: Right arm,Left arm,Chest,Abdomen,Right upper leg,Left upper leg,Face,Front perineal area   Body parts bathed by helper: Buttocks,Right lower leg,Left lower leg Body parts n/a: Front  perineal area,Buttocks,Right lower leg,Left lower leg   Bathing assist Assist Level: Minimal Assistance - Patient >  75%     Upper Body Dressing/Undressing Upper body dressing   What is the patient wearing?: Pull over shirt    Upper body assist Assist Level: Set up assist    Lower Body Dressing/Undressing Lower body dressing      What is the patient wearing?: Incontinence brief,Pants     Lower body assist Assist for lower body dressing: Moderate Assistance - Patient 50 - 74%     Toileting Toileting    Toileting assist Assist for toileting: Moderate Assistance - Patient 50 - 74% Assistive Device Comment: BSC   Transfers Chair/bed transfer  Transfers assist     Chair/bed transfer assist level: Minimal Assistance - Patient > 75% Chair/bed transfer assistive device: Programmer, multimedia   Ambulation assist      Assist level: Minimal Assistance - Patient > 75% Assistive device: Walker-rolling Max distance: 50'   Walk 10 feet activity   Assist     Assist level: Minimal Assistance - Patient > 75% Assistive device: Walker-rolling   Walk 50 feet activity   Assist Walk 50 feet with 2 turns activity did not occur: Safety/medical concerns  Assist level: Minimal Assistance - Patient > 75% Assistive device: Walker-rolling    Walk 150 feet activity   Assist Walk 150 feet activity did not occur: Safety/medical concerns         Walk 10 feet on uneven surface  activity   Assist Walk 10 feet on uneven surfaces activity did not occur: Safety/medical concerns         Wheelchair     Assist Will patient use wheelchair at discharge?: Yes Type of Wheelchair: Manual    Wheelchair assist level: Supervision/Verbal cueing Max wheelchair distance: 100'    Wheelchair 50 feet with 2 turns activity    Assist        Assist Level: Supervision/Verbal cueing   Wheelchair 150 feet activity     Assist      Assist Level: Moderate  Assistance - Patient 50 - 74%   Blood pressure 105/70, pulse (!) 120, temperature 99.1 F (37.3 C), resp. rate 20, height 5\' 9"  (1.753 m), weight 63.9 kg, SpO2 93 %.  Medical Problem List and Plan: 1.  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  1/7- spoke to brother yesterday who said he'd pass on to daughter- sounds like he didn't- palliative care to see pt  1/10- palliative saw pt- is DNAR/DNI now  1/11- called Oncology on call- trying to figure out plan for treatment- was told 12/27 that would do radiation and then immediately do Chemo, however, due to dispo issues (family cannot take home) needs to go to SNF after radiation- spoke at length to Dr Burr Medico- she or Dr Julien Nordmann will see pt today/tomorrow.   1/12- Cannot do PET scan until d/c form hospital. Dr Tommy Medal to see pt Friday  Continue CIR  2.  Antithrombotics: -DVT/anticoagulation:   Heparin.  Mechanical: Sequential compression devices, below knee Bilateral lower extremities   Dopplers negative for DVT  -antiplatelet therapy: N/A 3. Pain Management:    Decreased oxycodone to q4H prn.  DC gabapentin since causing constipation  Added Cymbalta 30 mg daily.   1/3- will increase  Duloxetine to 60 mg nightly- for nerve pain  1/5- not taking pain meds often- encouraged pt to take pain meds before therapy.   1/6- will reduce oxy to Norco q4 hours prn due to increased confusion per family (nursing hasn't seen it)  with pain meds  1/9 pt told me his pain is under control presently  1/10- will place lidoderm patches on shoulders/upper traps B/L where muscles REAL tight.   1/11- can't have lidoderm in >3 patches, but refused yesterday- will check into  this.  1/12- saw after left room, pt refused ALL meds this AM-   1/13- no pain meds in 2-4 days- tramadol or Norco 4. Mood: LCSW to follow for evaluation and support.              -antipsychotic agents: N/a  Discussed with nursing, he has been sleeping well.  5. Neuropsych: This patient is capable of making decisions on his own behalf.  Appreciate neuropsych 6. Skin/Wound Care: Routine pressure relief measures.  7. Fluids/Electrolytes/Nutrition: Monitor I/Os.             Added juven to promote healing.  8. Neurogenic bladder: Foley in place due to urinary retention/UTI, with plans to remove on Monday  Flomax daily initiated on 12/30, monitor blood pressure closely  Florinef 0.1 mg daily initiated to assist with low BP.   Improving on 1/2  1/3- will d/c foley this afternoon, after radiation  1/4- wasn't removed- will remove today- this AM and ordered I/o caths and PVRs since might not void.  1/5- so far, no voiding- cathing required 100%   1/6-9- no voiding- caths volumes 300-600c q6 hours  1/10- no voiding- in/out caths being done- pt doesn't want foley back.  1/11- no voiding still  1/12- no voiding- doing I/o caths- nursing doing- will need foley when goes to SNF  9.  Acute renal failure: Resolved  Creatinine 0.86 on 12/27  10. Acute blood loss anemia:   Hemoglobin 12.4 on 12/28  Continue to monitor 11. Metastatic Lung AdenoCA:  On dulera--has been on supplemental oxygen  1/4- didn't get radiation yesterday- is scheduled ot go back today.  1/6- Getting radiation daily M-F  1/13- Dr Julien Nordmann to see Friday- cannot get PET scan since inpt- explained to family.   12. Kleb/Proteus UTI: Completed course of Cipro 13. 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  1/5- refused bowel program last night  And as a result had accident this AM.   1/6- hard clumps of stool- will add Miralax daily.   1/7- hard clumps- just added miralax so will wait to increase til this weekend.   1/9 pt had small pebbles with bp last night. Will increase miralax to bid   -add metamucil   1/10- added Sorbitol today x1- and follow with suppository  1/11- pt had good BM last night.    1/12- will give enema tonight per request of nursing 14. Hypotension/tachycardia-   Severe orthostasis, negative on 12/31  Relatively controlled on 1/2  TEDs/abdominal binder for support.   1/5- added midodrine 5 mg TID-AC for low  Bps  1/7- increased Midodrine to 10 mg TID- will also increase Florinef to help with orthostatic hypotension. It's really bad when goes to radiation  1/9 bp more robust with recent changes  1/12- doing better today per OT, but then refused all AM meds-   1/13- tachycardia to 120s- will check EKG and labs to see if dehydrated-Last BUN 25- will give IVFs D5NS 75cc/hour x 1 days and monitor  15.  Sleep disturbance: Scheduled melatonin at nights.   Trazodone 100  1/3- sleeping well- con't regimen 17. Tenting/mild dehydration- will push 6 cups/water daily minimum  1/6- skin very dry- order eucerin AND push PO fluids 18. Hyperkalemia  Lokelma ordered on 12/23  Potassium 4.6 on 12/27 19. Pancoast tumor/ metastatic lung cancer:   Radiation to begin on 1/3 and to see Dr Julien Nordmann 1/3 at 2:15 pm  1/4- didn't get radiation- will make sure scheduled to  go back today  1/5- made sure pt went to radiation -his BP was low- but gave him 500cc IVFs NS bolus to help with BP- BP was 82/50s.   1/7- Increased Midodrine and florienf to help with low BP.   1/14: radiation today/ 20. Dysphagia per pt  12/30- ordered SLP eval and treat  1/3- placed on D3  thin diet  1/4- is better when sitting up at 90 degrees  1/6- will add Protonix for reflux Sx's.   1/8 MBS performed 1/7--mod oral/mild pharyng dysphagia   -D1, thins recommended by SLP, meds with puree  1/10-  Pt says doesn't help much with D1 thins.   1/12- per SLP, working on D2, but without teeth, cannot do D2 right now until passed trials 21. Hypercalcemia  1/4- is down to 11 from 11.2- will monitor- if goes back up more, will treat. 22. Poor appetite  1/10- will try Marinol 2.5 mg BID  1/12- not working as of yet- placed nutrition consult since not taking supplements - "too sticky".  22. Depression  1/11- on Cymbalta but cannot add Wellbutrin- has allergy- will put small dose of Celexa 10 mg daily on board-so he doesn't have issues with serotonin syndrome.   1/12- think it's the reason pt refused meds this AM, encouraged pt to take his meds for low BP, depression, etc.   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  1/13- want to make sure doesn't have stroke- will check head CT asap. Also called Ophtho- they said call back when confusion better and will examine.  Until then, cannot.  24. Confusion  1/13- could be marinol, since not receiving pain meds- will stop marinol 25. Severe malnutrition  1/13- has lost 16 lbs in last 3 weeks from 12/21- d/w family about a PEG-  And they agreed- cannot get from IR because hx of hernia repair with mesh- will call gen surgery.  26. Gastrojejunal anastamosis: Upper Gi series planned.    LOS: 24 days A FACE TO FACE EVALUATION WAS PERFORMED  Gregory Bautista 02/05/2020, 10:34 AM

## 2020-02-05 NOTE — Clinical Note (Incomplete)
Patient returned to department from radiology and assisted to bed by NT,VS

## 2020-02-05 NOTE — Progress Notes (Signed)
Occupational Therapy Session Note  Patient Details  Name: Gregory Bautista MRN: [id number] Date of Birth: August 19, 1940  Today's Date: 02/05/2020 OT Individual Time: 7290-2111 OT Individual Time Calculation (min): 54 min    Short Term Goals: Week 4:  OT Short Term Goal 1 (Week 4): Pt will don pants with min A sit<>stand from w/c or EOB OT Short Term Goal 2 (Week 4): Pt will perform toilet transfers with CGA using RW OT Short Term Goal 3 (Week 4): Pt will tolerate standing for 3 mins during functional tasks/ADLs with CGA  Skilled Therapeutic Interventions/Progress Updates:    Pt resting in bed upon arrival and agreeable to getting OOB and eating breakfast. Ted hose donned and ACE wraps applied to BLE. Supine>sit EOB with supervision. Abdominal binder donned. Pt required assistance threading BLE into pants. Sit<>stand with min A. Stand pivot tranfser with min A. Increased time to initiate and respond. No report of dizziness. Pt remained in w/c with breakfast on table over w/c. Belt alarm activated. All needs within reach.  Therapy Documentation Precautions:  Precautions Precautions: Back,Fall Precaution Comments: reviewed precautions Restrictions Weight Bearing Restrictions: No  Pain:  Pt denies pain this morning  Therapy/Group: Individual Therapy  Leroy Libman 02/05/2020, 7:54 AM

## 2020-02-05 NOTE — Progress Notes (Signed)
Per MRI pt needed to be NPO for about 6 hrs. Pt last ate at around 0930. Pt transported to MRI at 1800. Pt's family at bedside upset about pt's NPO status. Explained the rationale to family and assured that will order tray for pt once procedure is done.   Unable to initiate bowel program due to pt being NPO and off unit for MRI. Pt had large BM at 1132, partially continent and pt finished off on toilet.   Gerald Stabs, RN

## 2020-02-05 NOTE — Progress Notes (Addendum)
STROKE TEAM PROGRESS NOTE   SUBJECTIVE (INTERVAL HISTORY) No family is at the bedside.  Overall his condition is stable. Pt is lying in bed, flat affect, stated that his right eye vision remain the same, no change still not able to see. CIR and oncology has talked with family, will continue work-up for lung cancer and right eye vision loss.  Pending MRI brain with and without contrast, CT head and neck, and 2D echo.   OBJECTIVE Temp:  [97.8 F (36.6 C)-99.1 F (37.3 C)] 98.9 F (37.2 C) (01/14 1747) Pulse Rate:  [98-124] 107 (01/14 1747) Resp:  [15-20] 15 (01/14 1747) BP: (103-127)/(56-72) 116/70 (01/14 1747) SpO2:  [92 %-96 %] 93 % (01/14 1747)  Recent Labs  Lab 02/04/20 1325  GLUCAP 125*   Recent Labs  Lab 02/01/20 0539 02/04/20 1132 02/05/20 0520  NA 137 140 141  K 4.5 3.8 3.7  CL 98 100 102  CO2 28 27 29   GLUCOSE 139* 119* 131*  BUN 25* 21 18  CREATININE 0.81 0.80 0.78  CALCIUM 10.9* 10.5* 10.3  PHOS  --   --  2.9   Recent Labs  Lab 02/04/20 1132 02/05/20 0520  AST 22  --   ALT 27  --   ALKPHOS 59  --   BILITOT 1.0  --   PROT 7.5  --   ALBUMIN 2.4* 2.2*   Recent Labs  Lab 02/01/20 0539 02/04/20 1132 02/05/20 0520  WBC 5.9 6.2 5.5  NEUTROABS 4.1 4.6  --   HGB 10.3* 11.4* 10.5*  HCT 33.6* 37.0* 32.6*  MCV 91.6 90.0 88.6  PLT 237 268 234   No results for input(s): CKTOTAL, CKMB, CKMBINDEX, TROPONINI in the last 168 hours. No results for input(s): LABPROT, INR in the last 72 hours. No results for input(s): COLORURINE, LABSPEC, [place], GLUCOSEU, HGBUR, BILIRUBINUR, KETONESUR, PROTEINUR, UROBILINOGEN, NITRITE, LEUKOCYTESUR in the last 72 hours.  Invalid input(s): APPERANCEUR     Component Value Date/Time   CHOL 120 02/05/2020 0520   TRIG 58 02/05/2020 0520   HDL 44 02/05/2020 0520   CHOLHDL 2.7 02/05/2020 0520   VLDL 12 02/05/2020 0520   LDLCALC 64 02/05/2020 0520   Lab Results  Component Value Date   HGBA1C 5.7 (H) 02/05/2020   No results  found for: LABOPIA, COCAINSCRNUR, LABBENZ, AMPHETMU, THCU, LABBARB  No results for input(s): ETH in the last 168 hours.  I have personally reviewed the radiological images below and agree with the radiology interpretations.  CT ABDOMEN WO CONTRAST  Result Date: 02/05/2020 CLINICAL DATA:  Evaluate gastric anatomy prior to potential percutaneous gastrostomy placement. EXAM: CT ABDOMEN WITHOUT CONTRAST TECHNIQUE: Multidetector CT imaging of the abdomen was performed following the standard protocol without IV contrast. COMPARISON:  Chest CT-12/04/2019 FINDINGS: The lack of intravenous contrast limits the ability to evaluate solid abdominal organs. Lower chest: Limited visualization of the lower thorax demonstrates minimal dependent subpleural ground-glass atelectasis. No discrete focal airspace opacities. No pleural effusion Normal heart size. No pericardial effusion. There is mild decreased attenuation of the intra cardiac blood pool suggestive of anemia. Hepatobiliary: Normal hepatic contour. Normal noncontrast appearance of the gallbladder given degree of distention. No radiopaque gallstones. No ascites. Pancreas: Normal noncontrast appearance of the pancreas. Spleen: Normal noncontrast appearance of the spleen. Adrenals/Urinary Tract: There is a large (approximately 8.5 x 7.9 x 7.6 cm peripherally calcified mixed attenuating mass arising from the anterior aspect of the interpolar aspect of the right kidney (axial image 31, series 3; coronal image  50, series 6). Additional punctate (sub 1.1 cm) hypoattenuating renal lesions are seen bilaterally, incompletely evaluated though favored to represent renal cysts. Note is made of an approximately 0.8 x 0.6 cm nonobstructing stone within the right renal pelvis (axial image 25, series 3; coronal image 62, series 6). There is a punctate (approximately 2 mm) nonobstructing stone within the anterior interpolar aspect the left kidney (axial image 25, series 3). No  definitive renal stones are seen along the imaged course of the superior aspect of the bilateral ureters. Very minimal amount of symmetric bilateral perinephric stranding. No evidence of urinary obstruction. There is mild thickening of the lateral limb of the left adrenal gland without discrete nodule. Normal noncontrast appearance of the right adrenal gland. The urinary bladder was not imaged. Stomach/Bowel: Postoperative change of the GE junction as well as the anterior aspect of the gastric antrum potentially the sequela of previous gastrojejunostomy creation though a distal anastomosis was not imaged. Ingested enteric contrast is seen within the colon. No evidence of enteric obstruction. The anterior wall of the gastric antrum is otherwise well apposed against the ventral wall of the upper abdomen without interposition either the colon or liver (axial image 20, series 3). No pneumoperitoneum, pneumatosis or portal venous gas. Vascular/Lymphatic: Atherosclerotic plaque within a normal caliber abdominal aorta. No bulky retroperitoneal or mesenteric lymphadenopathy on this noncontrast examination. Other: Dermal calcification overlies the ventral aspect of the upper abdomen. Regional soft tissues appear otherwise normal. Musculoskeletal: No acute or aggressive osseous abnormalities. Mild-to-moderate multilevel lumbar spine DDD, worse at L3-L4 and L4-L5 with disc space height loss, endplate irregularity and small posteriorly directed disc osteophyte complexes at these locations. IMPRESSION: 1. Postoperative change of the GE junction as well as the anterior aspect of the gastric antrum which may represent the sequela of previous gastrojejunostomy. Correlation with previous operative reports is advised as if the patient is post gastrojejunostomy creation he would NOT be a candidate for percutaneous gastrostomy tube placement. If operative reports are not available, further evaluation with upper GI series could be  performed as indicated. 2. Gastric anatomy otherwise amenable to percutaneous gastrostomy tube placement as indicated. 3. Incidentally noted approximately 8.5 cm mixed attenuating mass arising from the anterior aspect of the interpolar aspect of the right kidney - while potentially representative of a complex renal cyst, further evaluation with contrast-enhanced abdominal MRI is advised. 4. Nonobstructing bilateral nephrolithiasis. 5. Aortic Atherosclerosis (ICD10-I70.0). Electronically Signed   By: Sandi Mariscal M.D.   On: 02/05/2020 08:09   DG Abd 1 View  Result Date: 01/12/2020 CLINICAL DATA:  Abdominal pain, constipation EXAM: ABDOMEN - 1 VIEW COMPARISON:  None. FINDINGS: Supine frontal view of the abdomen and pelvis excludes the pubic symphysis by collimation. No bowel obstruction or ileus. Significant retained stool throughout the colon consistent with constipation. Multiple surgical clips throughout the upper abdomen. Curvilinear calcification right mid abdomen of uncertain etiology, but could be associated with the right kidney. No acute bony abnormalities. IMPRESSION: 1. Significant fecal retention compatible with constipation. 2. Curvilinear calcification right mid abdomen, of uncertain etiology. This could be related to the right kidney. If further evaluation is desired, ultrasound or CT could be performed. Electronically Signed   By: Randa Ngo M.D.   On: 01/12/2020 17:17   CT HEAD WO CONTRAST  Result Date: 02/04/2020 CLINICAL DATA:  Acute loss of vision right eye. History of lung carcinoma EXAM: CT HEAD WITHOUT CONTRAST TECHNIQUE: Contiguous axial images were obtained from the base of the skull through the  vertex without intravenous contrast. COMPARISON:  None. FINDINGS: Brain: There is mild age related volume loss. There is no intracranial mass, hemorrhage, extra-axial fluid collection, or midline shift. Decreased attenuation is noted in the anterior limb of each external capsule, likely due  to prior lacunar type infarcts in these areas. No acute appearing infarct is evident. Vascular: No hyperdense vessel. There is calcification in each carotid siphon region. Skull: Bony calvarium appears intact. Sinuses/Orbits: There is mucosal thickening in several ethmoid air cells. Other visualized paranasal sinuses are clear. Orbits appear symmetric bilaterally. Other: Mastoid air cells are clear. 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 No acute appearing infarct is evident. No mass or hemorrhage. There are foci of arterial vascular calcification. There is mucosal thickening in several ethmoid air cells. Electronically Signed   By: Lowella Grip III M.D.   On: 02/04/2020 12:54   DG UGI W SINGLE CM (SOL OR THIN BA)  Result Date: 02/05/2020 CLINICAL DATA:  Abnormal CT. EXAM: WATER SOLUBLE UPPER GI SERIES TECHNIQUE: Single-column upper GI series was performed using water soluble contrast. CONTRAST:  57mL OMNIPAQUE IOHEXOL 300 MG/ML  SOLN COMPARISON:  CT scan 02/04/2020. FLUOROSCOPY TIME:  Fluoroscopy Time:  2 minutes and 48 seconds. Radiation Exposure Index (if provided by the fluoroscopic device): 14.2 mGy Number of Acquired Spot Images: 0 FINDINGS: Study is markedly limited by difficulty with patient positioning and piecemeal swallowing. Within this limitation, there is no gross esophageal abnormality. Surgical clips in the region of the esophagogastric junction or probably related to prior fundoplication. Contrast moves freely from the esophagus into the stomach although there is poor esophageal peristalsis. Fluoro table was pushes in 30 degrees head up in the patient was rotated in both obliques, but the gastrojejunostomy seen on CT scan earlier today is anterior and the Roux limb could not be opacified given limited stomach filling and inability to position the patient upright. IMPRESSION: 1. No evidence for obstruction at the gastroesophageal junction. 2.  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 Repeat upper GI series could be performed after patient recovers from acute symptoms (preferably as an outpatient) and is better able to participate with drinking contrast and upright positioning. Electronically Signed   By: Misty Stanley M.D.   On: 02/05/2020 13:56   DG Swallowing Func-Speech Pathology  Result Date: 01/29/2020 Objective Swallowing Evaluation: Type of Study: MBS-Modified Barium Swallow Study  Patient Details Name: Stran Raper MRN: [id number] Date of Birth: May 01, 1940 Today's Date: 01/29/2020 Time: No data recorded-No data recorded No data recorded Past Medical History: Past Medical History: Diagnosis Date . Allergic rhinitis   NEC . Asthma  . Glaucoma  . 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 Tobacco use disorder 09/24/2019 Past Surgical History: Past Surgical History: Procedure Laterality Date . COLON SURGERY   . EYE SURGERY Right  . LAMINECTOMY WITH POSTERIOR LATERAL ARTHRODESIS LEVEL 4 N/A 12/30/2019  Procedure: THORACIC THREE LAMINECTOMY, THORACIC INSTRUMENTATION AND FUSION THORACIC ONE-FIVE;  Surgeon: Newman Pies, MD;  Location: [facility];  Service: Neurosurgery;  Laterality: N/A; HPI: see H&P  No data recorded Assessment / Plan / Recommendation CHL IP CLINICAL IMPRESSIONS 01/29/2020 Clinical Impression Patient presents with a moderate oral and mild pharyngeal dysphagia. Oral phase of swallow consists of delayed anterior to posterior transit of all boluses (heavier boluses leading to longer delays), decreased bolus cohesion and piecemeal swallowing with mechanical soft solids, decreased mastication of mechanical  soft solids. Pharyngeal phase consisted of minimal instances of delays of swallow initiation to vallecular sinus with thin liquids, mild amount of pyriform residuals and trace to mild vallecular sinus residuals with thin liquids,mechanical soft solids. 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 No penetration or aspiration events observed. SLP Visit Diagnosis Dysphagia, oropharyngeal phase (R13.12) Attention and concentration deficit following -- Frontal lobe and executive function deficit following -- Impact on safety and function No limitations   No flowsheet data found.  No flowsheet data found. CHL IP DIET RECOMMENDATION 01/29/2020 SLP Diet Recommendations Dysphagia 1 (Puree) solids;Thin liquid Liquid Administration via Cup;Straw Medication Administration Whole meds with puree Compensations Minimize environmental distractions;Slow rate;Small sips/bites;Follow solids with liquid Postural Changes Remain semi-upright after after feeds/meals (Comment)   CHL IP OTHER RECOMMENDATIONS 01/29/2020 Recommended Consults -- Oral Care Recommendations Oral care BID Other Recommendations --   CHL IP FOLLOW UP RECOMMENDATIONS 01/29/2020 Follow up Recommendations None   No flowsheet data found.     CHL IP ORAL PHASE 01/29/2020 Oral Phase Impaired Oral - Pudding Teaspoon -- Oral - Pudding Cup -- Oral - Honey Teaspoon -- Oral - Honey Cup -- Oral - Nectar Teaspoon -- Oral - Nectar Cup -- Oral - Nectar Straw -- Oral - Thin Teaspoon -- Oral - Thin Cup Reduced posterior propulsion;Delayed oral transit Oral - Thin Straw -- Oral - Puree Piecemeal swallowing;Reduced posterior propulsion;Delayed oral transit Oral - Mech Soft Impaired mastication;Weak lingual manipulation;Piecemeal swallowing;Delayed oral transit;Reduced posterior propulsion;Holding of bolus Oral - Regular -- Oral - Multi-Consistency -- Oral - Pill Decreased bolus cohesion;Reduced posterior propulsion;Weak lingual manipulation;Delayed oral transit Oral Phase - Comment --  CHL IP PHARYNGEAL PHASE 01/29/2020 Pharyngeal Phase Impaired Pharyngeal- Pudding Teaspoon -- Pharyngeal -- Pharyngeal- Pudding Cup -- Pharyngeal -- Pharyngeal- Honey Teaspoon -- Pharyngeal -- Pharyngeal- Honey Cup  -- Pharyngeal -- Pharyngeal- Nectar Teaspoon -- Pharyngeal -- Pharyngeal- Nectar Cup -- Pharyngeal -- Pharyngeal- Nectar Straw -- Pharyngeal -- Pharyngeal- Thin Teaspoon -- Pharyngeal -- Pharyngeal- Thin Cup Delayed swallow initiation-vallecula;Pharyngeal residue - pyriform Pharyngeal -- Pharyngeal- Thin Straw Delayed swallow initiation-vallecula Pharyngeal -- Pharyngeal- Puree WFL Pharyngeal -- Pharyngeal- Mechanical Soft Pharyngeal residue - valleculae Pharyngeal -- Pharyngeal- Regular -- Pharyngeal -- Pharyngeal- Multi-consistency -- Pharyngeal -- Pharyngeal- Pill WFL Pharyngeal -- Pharyngeal Comment --  CHL IP CERVICAL ESOPHAGEAL PHASE 01/29/2020 Cervical Esophageal Phase Impaired Pudding Teaspoon -- Pudding Cup -- Honey Teaspoon -- Honey Cup -- Nectar Teaspoon -- Nectar Cup -- Nectar Straw -- Thin Teaspoon -- Thin Cup -- Thin Straw -- Puree Prominent cricopharyngeal segment Mechanical Soft Prominent cricopharyngeal segment Regular -- Multi-consistency -- Pill -- Cervical Esophageal Comment -- Sonia Baller, MA, CCC-SLP Speech Therapy             VAS [place] LOWER EXTREMITY VENOUS (DVT)  Result Date: 01/12/2020  Lower Venous DVT Study Other Indications: Immobility. Comparison Study: No previous scan Performing Technologist: Vonzell Schlatter RVT  Examination Guidelines: A complete evaluation includes B-mode imaging, spectral Doppler, color Doppler, and power Doppler as needed of all accessible portions of each vessel. Bilateral testing is considered an integral part of a complete examination. Limited examinations for reoccurring indications may be performed as noted. The reflux portion of the exam is performed with the patient in reverse Trendelenburg.  +---------+---------------+---------+-----------+----------+--------------+ RIGHT    CompressibilityPhasicitySpontaneityPropertiesThrombus Aging +---------+---------------+---------+-----------+----------+--------------+ CFV      Full           Yes      Yes                                  +---------+---------------+---------+-----------+----------+--------------+  SFJ      Full                                                        +---------+---------------+---------+-----------+----------+--------------+ FV Prox  Full                                                        +---------+---------------+---------+-----------+----------+--------------+ FV Mid   Full                                                        +---------+---------------+---------+-----------+----------+--------------+ FV DistalFull                                                        +---------+---------------+---------+-----------+----------+--------------+ PFV      Full                                                        +---------+---------------+---------+-----------+----------+--------------+ POP      Full           Yes      Yes                                 +---------+---------------+---------+-----------+----------+--------------+ PTV      Full                                                        +---------+---------------+---------+-----------+----------+--------------+ PERO     Full                                                        +---------+---------------+---------+-----------+----------+--------------+   +---------+---------------+---------+-----------+----------+--------------+ LEFT     CompressibilityPhasicitySpontaneityPropertiesThrombus Aging +---------+---------------+---------+-----------+----------+--------------+ CFV      Full           Yes      Yes                                 +---------+---------------+---------+-----------+----------+--------------+ SFJ      Full                                                        +---------+---------------+---------+-----------+----------+--------------+  FV Prox  Full                                                         +---------+---------------+---------+-----------+----------+--------------+ FV Mid   Full                                                        +---------+---------------+---------+-----------+----------+--------------+ FV DistalFull                                                        +---------+---------------+---------+-----------+----------+--------------+ PFV      Full                                                        +---------+---------------+---------+-----------+----------+--------------+ POP      Full           Yes      Yes                                 +---------+---------------+---------+-----------+----------+--------------+ PTV      Full                                                        +---------+---------------+---------+-----------+----------+--------------+ PERO     Full                                                        +---------+---------------+---------+-----------+----------+--------------+     Summary: RIGHT: - There is no evidence of deep vein thrombosis in the lower extremity.  - No cystic structure found in the popliteal fossa.  LEFT: - There is no evidence of deep vein thrombosis in the lower extremity.  - No cystic structure found in the popliteal fossa.  *See table(s) above for measurements and observations. Electronically signed by Harold Barban MD on 01/12/2020 at 6:05:45 PM.    Final     PHYSICAL EXAM  Temp:  [97.8 F (36.6 C)-99.1 F (37.3 C)] 98.9 F (37.2 C) (01/14 1747) Pulse Rate:  [98-124] 107 (01/14 1747) Resp:  [15-20] 15 (01/14 1747) BP: (103-127)/(56-72) 116/70 (01/14 1747) SpO2:  [92 %-96 %] 93 % (01/14 1747)  General - well nourished, well developed, in no apparent distress, flat affect.    Ophthalmologic - fundi not visualized due to noncooperation.    Cardiovascular - regular rhythm and rate  Neuro - awake alert, eyes open, orientated to place and months, but not orientated to  age and  year.  No aphasia, however, paucity of speech and difficulty describing pictures, but able to name and read.  Follows simple commands.  Right eye no light perception, left eye able to count fingers.  Left eye visual field full.  No gaze palsy. Left pupil 1.15mm and right pupil 23mm, both not reactive to light. Patient symmetrical, tongue midline.  Moving bilateral upper extremities equally.  Left lower extremity no drift, right lower extremity not able to against gravity.  Sensation symmetrical.  Finger-to-nose intact.  Gait not tested.   ASSESSMENT/PLAN Mr. Gregory Bautista is a 80 y.o. 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 On daily radio therapy. Found tohave Pancoast tumor right painless vision loss, not sure time onset. No tPA given due to unclear time onset.    Right CRAO - work up underway  CT no acute finding  MRI with and without contrast pending  CTA head and neck pending  2D Echo pending  LDL 64  HgbA1c 5.7  Heparin subcu for VTE prophylaxis  aspirin 81 mg daily prior to admission, now on aspirin 81 mg daily. Further antiplatelet regimen based on work up findings  Ongoing aggressive stroke risk factor management  Therapy recommendations:  CIR  Disposition:  pending  Lung cancer  Found to have pancoast tumor on work up  mets to T spine and spinal cord compression s/p debulking and decompression surgery on 12/30/19   Oncology on board  On daily radiation therapy  MRI brain with and without pending  MRI abdomen with and without pending  Dysphagia   On dys1 and thin  Not adequate po input  Plan for PEG  Speech to follow  Tobacco abuse  Current smoker  Smoking cessation counseling provided  Pt is willing to quit  Other Stroke Risk Factors  Advanced age  Other Active Problems  Anemia of chronic  disease  Hospital day # 24   Rosalin Hawking, MD PhD Stroke Neurology 02/05/2020 7:03 PM    To contact Stroke Continuity provider, please refer to [email or web address] After hours, contact General Neurology

## 2020-02-05 NOTE — Progress Notes (Signed)
Recreational Therapy Session Note  Patient Details  Name: Gregory Bautista MRN: [id number] Date of Birth: December 27, 1940 Today's Date: 02/05/2020  Pain: no c/o Skilled Therapeutic Interventions/Progress Updates: Session focused on increasing mood, activity tolerance, dynamic standing balance and short distance ambulation with PT.  Pt up in w/c upon arrival, abdominal binder loose fitting and agreeable to therapy.  Pt with c/o discomfort with abdominal binder.  Reviewed purpose of the binder and pt agreeable to repositioning.  Vitals assessed, see PT documentation for specifics.  Pt taken to the dayroom total assist +2 for equipment management (IV pole, oxygen tank) and energy conservation.  Once in the dayroom, pt ambulated with RW ~12' from w/c to mat with contact guard assist +2 for equipment management.  During seated rest break, utilized therapeutic use of music to assist with mood/coping which pt enjoyed.  Pt became tearful stating he was trying to handle a lot of problems.  Emotional support/encouragment provided, pt appreciative.  Pt then stood with RW and contact guard assist for toe tap activity tapping x10 RLE and then LLE on 4' step.  After seated rest break, pt then performed ball taps with LUE and then RUE (1UE support on RW) with min assist, min cues.  Seated rest break provided and pt then ambulated ~12' with RW and contact guard assist.  Pt without complaints throughout this session.  Therapy/Group: Co-Treatment  Marysa Wessner 02/05/2020, 11:26 AM

## 2020-02-05 NOTE — Progress Notes (Signed)
Pt in therapy, RN will re consult for PIV start.

## 2020-02-05 NOTE — Progress Notes (Signed)
SLP Cancellation Note  Patient Details Name: Gregory Bautista MRN: [id number] DOB: [birth year]   Cancelled treatment:        Patient unable to be seen for dysphagia tx as he is NPO for procedure.                                                                                              Sonia Baller, MA, CCC-SLP Speech Therapy

## 2020-02-05 NOTE — CV Procedure (Addendum)
Echocardiogram not complete, patient undergoing other testing.  Darlina Sicilian RDCS

## 2020-02-06 ENCOUNTER — Inpatient Hospital Stay (HOSPITAL_COMMUNITY): Payer: Medicare Other | Admitting: Occupational Therapy

## 2020-02-06 ENCOUNTER — Inpatient Hospital Stay (HOSPITAL_COMMUNITY): Payer: Medicare Other | Admitting: Speech Pathology

## 2020-02-06 ENCOUNTER — Inpatient Hospital Stay (HOSPITAL_COMMUNITY): Payer: Medicare Other

## 2020-02-06 DIAGNOSIS — I6389 Other cerebral infarction: Secondary | ICD-10-CM

## 2020-02-06 LAB — ECHOCARDIOGRAM COMPLETE
Height: 69 in
S' Lateral: 2.5 cm
Weight: 2183.44 oz

## 2020-02-06 NOTE — Progress Notes (Signed)
Pt refused bowel program and bladder scans this evening. Pt had large continent BM at 1425. Pt currently resting in bed with all needs within reach.   Gerald Stabs, RN

## 2020-02-06 NOTE — Progress Notes (Signed)
  Echocardiogram 2D Echocardiogram has been performed.  Gregory Bautista 02/06/2020, 10:38 AM

## 2020-02-06 NOTE — Progress Notes (Signed)
Occupational Therapy Session Note  Patient Details  Name: Gregory Bautista MRN: [id number] Date of Birth: [birth year]  Today's Date: 02/06/2020 OT Individual Time: 1300-1341 OT Individual Time Calculation (min): 41 min   Skilled Therapeutic Interventions/Progress Updates:    Pt greeted transferring back to the bed with NT and RN present. Per RN, pt had just finished toileting and had been standing for a long time. Pt visibly fatigued and requested to return to bed. When reminded that he had not eaten lunch yet, pt was agreeable to transfer EOB to eat. He needed vcs to remember his swallowing strategies, appeared solemn and expressed frustration regarding tremulous movement in his Lt hand when eating. Pt then became more sad and teary, reported feeling so tired and frustrated regarding his slow therapeutic progress. OT provided him with emotional support and encouragement at this time. We discussed psychosocial coping strategies such as recognizing helpful vs unhelpful thoughts and focusing more on the encouraging helpful thoughts that would support him during his CIR stay/thearpy engagement. Pt appeared receptive to education and support. He did not want to eat any more and when pt returned to bed OT played meaningful music. Worked on gentle ROM/NMR of UEs and feet. Pts affect appeared brightened at this time, 02 sats 98% on 1.5L and HR 148bpm while participating. Per RN, pt runs tachycardic. Pt remained in bed with all needs within reach, bed alarm set, and meaningful music still playing for psychosocial health.   RN aware of pts sadness during tx  Therapy Documentation Precautions:  Precautions Precautions: Back,Fall Precaution Comments: reviewed precautions Restrictions Weight Bearing Restrictions: No Vital Signs: Therapy Vitals Temp: 98.4 F (36.9 C) Temp Source: Oral Pulse Rate: 90 Resp: 16 BP: 116/70 Patient Position (if appropriate): Lying Oxygen Therapy SpO2: 100 % O2 Device:  Nasal Cannula Pain: no c/o pain during tx   ADL: ADL Eating: Set up Grooming: Setup Where Assessed-Grooming: Sitting at sink Upper Body Bathing: Supervision/safety Where Assessed-Upper Body Bathing: Sitting at sink Lower Body Bathing: Moderate assistance Where Assessed-Lower Body Bathing: Standing at sink,Sitting at sink Upper Body Dressing: Supervision/safety Where Assessed-Upper Body Dressing: Sitting at sink Lower Body Dressing: Moderate assistance Where Assessed-Lower Body Dressing: Wheelchair Toileting: Moderate assistance Where Assessed-Toileting: Glass blower/designer: Minimal Print production planner Method: Stand Ecologist: Raised toilet seat     Therapy/Group: Individual Therapy  [name] [name] 02/06/2020, 3:26 PM

## 2020-02-06 NOTE — Progress Notes (Signed)
STROKE TEAM PROGRESS NOTE   SUBJECTIVE (INTERVAL HISTORY) Patient is sitting up in bed.  He still has no light perception or any vision in the right eye.  Vital signs are stable.  MRI scan of the brain is negative for acute stroke.  CT angiogram shows 45% encasement of the left vertebral artery and the Pancoast tumor.  LDL cholesterol 64 mg percent.  Hemoglobin A1c is 5.7.  Echocardiogram is pending.   OBJECTIVE Temp:  [97.7 F (36.5 C)-99.1 F (37.3 C)] 98.4 F (36.9 C) (01/15 0120) Pulse Rate:  [98-120] 101 (01/15 0120) Resp:  [15-20] 20 (01/15 0120) BP: (105-152)/(67-91) 127/79 (01/15 0120) SpO2:  [92 %-100 %] 100 % (01/15 0120)  Recent Labs  Lab 02/04/20 1325  GLUCAP 125*   Recent Labs  Lab 02/01/20 0539 02/04/20 1132 02/05/20 0520  NA 137 140 141  K 4.5 3.8 3.7  CL 98 100 102  CO2 28 27 29   GLUCOSE 139* 119* 131*  BUN 25* 21 18  CREATININE 0.81 0.80 0.78  CALCIUM 10.9* 10.5* 10.3  PHOS  --   --  2.9   Recent Labs  Lab 02/04/20 1132 02/05/20 0520  AST 22  --   ALT 27  --   ALKPHOS 59  --   BILITOT 1.0  --   PROT 7.5  --   ALBUMIN 2.4* 2.2*   Recent Labs  Lab 02/01/20 0539 02/04/20 1132 02/05/20 0520  WBC 5.9 6.2 5.5  NEUTROABS 4.1 4.6  --   HGB 10.3* 11.4* 10.5*  HCT 33.6* 37.0* 32.6*  MCV 91.6 90.0 88.6  PLT 237 268 234   No results for input(s): CKTOTAL, CKMB, CKMBINDEX, TROPONINI in the last 168 hours. No results for input(s): LABPROT, INR in the last 72 hours. No results for input(s): COLORURINE, LABSPEC, [place], GLUCOSEU, HGBUR, BILIRUBINUR, KETONESUR, PROTEINUR, UROBILINOGEN, NITRITE, LEUKOCYTESUR in the last 72 hours.  Invalid input(s): APPERANCEUR     Component Value Date/Time   CHOL 120 02/05/2020 0520   TRIG 58 02/05/2020 0520   HDL 44 02/05/2020 0520   CHOLHDL 2.7 02/05/2020 0520   VLDL 12 02/05/2020 0520   LDLCALC 64 02/05/2020 0520   Lab Results  Component Value Date   HGBA1C 5.7 (H) 02/05/2020   No results found for:  LABOPIA, COCAINSCRNUR, LABBENZ, AMPHETMU, THCU, LABBARB  No results for input(s): ETH in the last 168 hours.  IMAGING  CT ABDOMEN WO CONTRAST 02/05/2020 IMPRESSION:  1. Postoperative change of the GE junction as well as the anterior aspect of the gastric antrum which may represent the sequela of previous gastrojejunostomy. Correlation with previous operative reports is advised as if the patient is post gastrojejunostomy creation he would NOT be a candidate for percutaneous gastrostomy tube placement. If operative reports are not available, further evaluation with upper GI series could be performed as indicated.  2. Gastric anatomy otherwise amenable to percutaneous gastrostomy tube placement as indicated.  3. Incidentally noted approximately 8.5 cm mixed attenuating mass arising from the anterior aspect of the interpolar aspect of the right kidney - while potentially representative of a complex renal cyst, further evaluation with contrast-enhanced abdominal MRI is advised.  4. Nonobstructing bilateral nephrolithiasis.  5. Aortic Atherosclerosis (ICD10-I70.0).   DG Abd 1 View 01/12/2020 IMPRESSION:  1. Significant fecal retention compatible with constipation.  2. Curvilinear calcification right mid abdomen, of uncertain etiology. This could be related to the right kidney. If further evaluation is desired, ultrasound or CT could be performed.   DG UGI  W SINGLE CM (SOL OR THIN BA) 02/05/2020 IMPRESSION:  1. No evidence for obstruction at the gastroesophageal junction.  2. 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 Repeat upper GI series could be performed after patient recovers from acute symptoms (preferably as an outpatient) and is better able to participate with drinking contrast and upright positioning.  DG Swallowing Func-Speech Pathology 01/29/2020 Objective Swallowing Evaluation: Type of Study: MBS-Modified Barium  Swallow Study  Patient Details Name: Gregory Bautista MRN: [id number] Date of Birth: 1940-12-15 Today's Date: 01/29/2020 Time: No data recorded-No data recorded No data recorded Past Medical History: Past Medical History: Diagnosis Date . Allergic rhinitis   NEC . Asthma  . Glaucoma  . 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 Tobacco use disorder 09/24/2019 Past Surgical History: Past Surgical History: Procedure Laterality Date . COLON SURGERY   . EYE SURGERY Right  . LAMINECTOMY WITH POSTERIOR LATERAL ARTHRODESIS LEVEL 4 N/A 12/30/2019  Procedure: THORACIC THREE LAMINECTOMY, THORACIC INSTRUMENTATION AND FUSION THORACIC ONE-FIVE;  Surgeon: Newman Pies, MD;  Location: [facility];  Service: Neurosurgery;  Laterality: N/A; HPI: see H&P  No data recorded Assessment / Plan / Recommendation CHL IP CLINICAL IMPRESSIONS 01/29/2020 Clinical Impression Patient presents with a moderate oral and mild pharyngeal dysphagia. Oral phase of swallow consists of delayed anterior to posterior transit of all boluses (heavier boluses leading to longer delays), decreased bolus cohesion and piecemeal swallowing with mechanical soft solids, decreased mastication of mechanical soft solids. Pharyngeal phase consisted of minimal instances of delays of swallow initiation to vallecular sinus with thin liquids, mild amount of pyriform residuals and trace to mild vallecular sinus residuals with thin liquids,mechanical soft solids. 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 No penetration or aspiration events observed. SLP Visit Diagnosis Dysphagia, oropharyngeal phase (R13.12) Attention and concentration deficit following -- Frontal lobe and executive function deficit following -- Impact on safety and function No limitations   No flowsheet data found.  No flowsheet data found. CHL IP DIET RECOMMENDATION 01/29/2020 SLP Diet Recommendations Dysphagia 1 (Puree) solids;Thin liquid Liquid  Administration via Cup;Straw Medication Administration Whole meds with puree Compensations Minimize environmental distractions;Slow rate;Small sips/bites;Follow solids with liquid Postural Changes Remain semi-upright after after feeds/meals (Comment)   CHL IP OTHER RECOMMENDATIONS 01/29/2020 Recommended Consults -- Oral Care Recommendations Oral care BID Other Recommendations --   CHL IP FOLLOW UP RECOMMENDATIONS 01/29/2020 Follow up Recommendations None   No flowsheet data found.     CHL IP ORAL PHASE 01/29/2020 Oral Phase Impaired Oral - Pudding Teaspoon -- Oral - Pudding Cup -- Oral - Honey Teaspoon -- Oral - Honey Cup -- Oral - Nectar Teaspoon -- Oral - Nectar Cup -- Oral - Nectar Straw -- Oral - Thin Teaspoon -- Oral - Thin Cup Reduced posterior propulsion;Delayed oral transit Oral - Thin Straw -- Oral - Puree Piecemeal swallowing;Reduced posterior propulsion;Delayed oral transit Oral - Mech Soft Impaired mastication;Weak lingual manipulation;Piecemeal swallowing;Delayed oral transit;Reduced posterior propulsion;Holding of bolus Oral - Regular -- Oral - Multi-Consistency -- Oral - Pill Decreased bolus cohesion;Reduced posterior propulsion;Weak lingual manipulation;Delayed oral transit Oral Phase - Comment --  CHL IP PHARYNGEAL PHASE 01/29/2020 Pharyngeal Phase Impaired Pharyngeal- Pudding Teaspoon -- Pharyngeal -- Pharyngeal- Pudding Cup -- Pharyngeal -- Pharyngeal- Honey Teaspoon -- Pharyngeal -- Pharyngeal- Honey Cup -- Pharyngeal -- Pharyngeal- Nectar Teaspoon -- Pharyngeal -- Pharyngeal- Nectar Cup -- Pharyngeal -- Pharyngeal- Nectar Straw -- Pharyngeal -- Pharyngeal-  Thin Teaspoon -- Pharyngeal -- Pharyngeal- Thin Cup Delayed swallow initiation-vallecula;Pharyngeal residue - pyriform Pharyngeal -- Pharyngeal- Thin Straw Delayed swallow initiation-vallecula Pharyngeal -- Pharyngeal- Puree WFL Pharyngeal -- Pharyngeal- Mechanical Soft Pharyngeal residue - valleculae Pharyngeal -- Pharyngeal- Regular -- Pharyngeal --  Pharyngeal- Multi-consistency -- Pharyngeal -- Pharyngeal- Pill WFL Pharyngeal -- Pharyngeal Comment --  CHL IP CERVICAL ESOPHAGEAL PHASE 01/29/2020 Cervical Esophageal Phase Impaired Pudding Teaspoon -- Pudding Cup -- Honey Teaspoon -- Honey Cup -- Nectar Teaspoon -- Nectar Cup -- Nectar Straw -- Thin Teaspoon -- Thin Cup -- Thin Straw -- Puree Prominent cricopharyngeal segment Mechanical Soft Prominent cricopharyngeal segment Regular -- Multi-consistency -- Pill -- Cervical Esophageal Comment -- Sonia Baller, MA, CCC-SLP Speech Therapy             VAS [place] LOWER EXTREMITY VENOUS (DVT) 01/12/2020 Summary:  RIGHT: - There is no evidence of deep vein thrombosis in the lower extremity.  - No cystic structure found in the popliteal fossa.   LEFT: - There is no evidence of deep vein thrombosis in the lower extremity.  - No cystic structure found in the popliteal fossa.    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 No acute appearing infarct is evident. No mass or hemorrhage. There are foci of arterial vascular calcification. There is mucosal thickening in several ethmoid air cells.   MR BRAIN W WO CONTRAST 02/05/2020 IMPRESSION:  No acute abnormality.  Specifically, no acute infarct.   CT ANGIO HEAD CODE STROKE CT ANGIO NECK CODE STROKE 02/05/2020 IMPRESSION:  1. No emergent large vessel occlusion.  2. 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  3. 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 The size and overall extent of the tumor appears progressed since prior CT chest from December 04, 2019. CT of the chest with contrast could further characterize lung/mediastinal progression and MRI of the thoracic spine with contrast could further characterize thoracic (including canal)  invasion if clinically indicated.    PHYSICAL EXAM   Temp:  [97.7 F (36.5 C)-99.1 F (37.3 C)] 98.4 F (36.9 C) (01/15 0120) Pulse Rate:  [98-120] 101 (01/15 0120) Resp:  [15-20] 20 (01/15 0120) BP: (105-152)/(67-91) 127/79 (01/15 0120) SpO2:  [92 %-100 %] 100 % (01/15 0120)  General - well nourished, well developed, in no apparent distress, flat affect.    Ophthalmologic - fundi not visualized due to noncooperation.    Cardiovascular - regular rhythm and rate  Neuro - awake alert, eyes open, oriented to place and months, but not oriented to age and year.  No aphasia, however, paucity of speech and difficulty  but able to name and read.  Follows simple commands.  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  Left eye visual field full.  No gaze palsy. Left pupil 1.95m and right pupil 368m both not reactive to light. Patient symmetrical, tongue midline.  Moving bilateral upper extremities equally.  Left lower extremity no drift, right lower extremity not able to against gravity.  Sensation symmetrical.  Finger-to-nose intact.  Gait not tested.   ASSESSMENT/PLAN Mr. HeJeydan Bautista a [age].o. 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 On daily radio therapy. Found to have Pancoast tumor right painless vision loss, not sure time onset. No tPA given due to unclear time onset.    Right CRAO -    CT - no acute finding. 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  MRI with and without contrast - No acute abnormality.  Specifically, no acute infarct.   CTA head and neck - No emergent large vessel occlusion. 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 Partially imaged large left apical lung mass which invades  into the adjacent thoracic spine and mediastinum. The size and overall extent of the tumor appears progressed since prior CT chest from December 04, 2019. CT of the chest with contrast could further characterize lung/mediastinal progression and MRI of the thoracic spine with contrast could further characterize thoracic (including canal) invasion if clinically indicated.  2D Echo pending  LDL 64  HgbA1c 5.7  Heparin subcu for VTE prophylaxis  aspirin 81 mg daily prior to admission, now on aspirin 81 mg daily. Further antiplatelet regimen based on work up findings  Ongoing aggressive stroke risk factor management  Therapy recommendations:  CIR  Disposition:  pending  Lung cancer  Found to have pancoast tumor on work up  mets to T spine and spinal cord compression s/p debulking and decompression surgery on 12/30/19   Oncology on board  On daily radiation therapy  MRI brain with and without - No acute abnormality.  Specifically, no acute infarct.  MRI abdomen with and without - pending  Dysphagia   On dys1 and thin  Not adequate po input  Plan for PEG  Speech to follow  Tobacco abuse  Current smoker  Smoking cessation counseling provided  Pt is willing to quit  Other Stroke Risk Factors  Advanced age  Other Active Problems  Anemia of chronic disease  Aortic Atherosclerosis (ICD10-I70.0).   Hospital day # 25  Recommend continue aspirin for stroke prevention and aggressive risk factor modification.  Treatment of Pancoast tumor as per oncology and primary team .recommend checking ESR and ANA panel.  Stroke team will sign off.  Kindly call for questions.  Discussed with Dr.Raulkar Greater than 50% time during this 25-minute visit was spent on counseling and coordination of care about vision loss and stroke risk and answering questions 02/06/2020 5:35 AM    To contact Stroke Continuity provider, please refer to [email or web address] After hours, contact General  Neurology

## 2020-02-06 NOTE — Progress Notes (Signed)
Speech Language Pathology Daily Session Note  Patient Details  Name: Frans Valente MRN: [id number] Date of Birth: [birth year]  Today's Date: 02/06/2020 SLP Individual Time: 3668-1594 SLP Individual Time Calculation (min): 35 min  Short Term Goals: Week 2: SLP Short Term Goal 1 (Week 2): Pt will consume current diet with use of swallow and esphogeal strategies mod I.  Skilled Therapeutic Interventions:   Patient seen to address dysphagia goals. His RN was in room at beginning of session and SLP observed patient taking his pills (whole, one at a time) with sips of grape juice. Patient exhibited a significantly prolonged oral phase prior to initiation of swallow for each pill. RN reported she had been giving meds crushed but patient complained of taste. Patient reported that he does "what he can" as far as PO intake. He had eaten grits with some milk and had some of magic cup as well as juices. He reports he cannot eat much of the puree solids due to swallowing difficulty. SLP reviewed MBS results again with patient and RN. Patient will benefit from at least 1-2 more session focused on diet toleration and management, however he has not been able to demonstrate progression beyond current diet of Dys 1, thin liquids.   Pain Pain Assessment Pain Scale: 0-10 Pain Score: 0-No pain  Therapy/Group: Individual Therapy  Sonia Baller, MA, CCC-SLP Speech Therapy

## 2020-02-06 NOTE — Progress Notes (Signed)
[place] PHYSICAL MEDICINE & REHABILITATION PROGRESS NOTE   Subjective/Complaints: Mr. Gerads has no complaints this morning. Radiation went well yesterday I attempted to call brother yesterday but there was no answer or option to leave voicemail.  ROS:  Limited by cognition  Objective:   CT ABDOMEN WO CONTRAST  Result Date: 02/05/2020 CLINICAL DATA:  Evaluate gastric anatomy prior to potential percutaneous gastrostomy placement. EXAM: CT ABDOMEN WITHOUT CONTRAST TECHNIQUE: Multidetector CT imaging of the abdomen was performed following the standard protocol without IV contrast. COMPARISON:  Chest CT-12/04/2019 FINDINGS: The lack of intravenous contrast limits the ability to evaluate solid abdominal organs. Lower chest: Limited visualization of the lower thorax demonstrates minimal dependent subpleural ground-glass atelectasis. No discrete focal airspace opacities. No pleural effusion Normal heart size. No pericardial effusion. There is mild decreased attenuation of the intra cardiac blood pool suggestive of anemia. Hepatobiliary: Normal hepatic contour. Normal noncontrast appearance of the gallbladder given degree of distention. No radiopaque gallstones. No ascites. Pancreas: Normal noncontrast appearance of the pancreas. Spleen: Normal noncontrast appearance of the spleen. Adrenals/Urinary Tract: There is a large (approximately 8.5 x 7.9 x 7.6 cm peripherally calcified mixed attenuating mass arising from the anterior aspect of the interpolar aspect of the right kidney (axial image 31, series 3; coronal image 50, series 6). Additional punctate (sub 1.1 cm) hypoattenuating renal lesions are seen bilaterally, incompletely evaluated though favored to represent renal cysts. Note is made of an approximately 0.8 x 0.6 cm nonobstructing stone within the right renal pelvis (axial image 25, series 3; coronal image 62, series 6). There is a punctate (approximately 2 mm) nonobstructing stone within the  anterior interpolar aspect the left kidney (axial image 25, series 3). No definitive renal stones are seen along the imaged course of the superior aspect of the bilateral ureters. Very minimal amount of symmetric bilateral perinephric stranding. No evidence of urinary obstruction. There is mild thickening of the lateral limb of the left adrenal gland without discrete nodule. Normal noncontrast appearance of the right adrenal gland. The urinary bladder was not imaged. Stomach/Bowel: Postoperative change of the GE junction as well as the anterior aspect of the gastric antrum potentially the sequela of previous gastrojejunostomy creation though a distal anastomosis was not imaged. Ingested enteric contrast is seen within the colon. No evidence of enteric obstruction. The anterior wall of the gastric antrum is otherwise well apposed against the ventral wall of the upper abdomen without interposition either the colon or liver (axial image 20, series 3). No pneumoperitoneum, pneumatosis or portal venous gas. Vascular/Lymphatic: Atherosclerotic plaque within a normal caliber abdominal aorta. No bulky retroperitoneal or mesenteric lymphadenopathy on this noncontrast examination. Other: Dermal calcification overlies the ventral aspect of the upper abdomen. Regional soft tissues appear otherwise normal. Musculoskeletal: No acute or aggressive osseous abnormalities. Mild-to-moderate multilevel lumbar spine DDD, worse at L3-L4 and L4-L5 with disc space height loss, endplate irregularity and small posteriorly directed disc osteophyte complexes at these locations. IMPRESSION: 1. Postoperative change of the GE junction as well as the anterior aspect of the gastric antrum which may represent the sequela of previous gastrojejunostomy. Correlation with previous operative reports is advised as if the patient is post gastrojejunostomy creation he would NOT be a candidate for percutaneous gastrostomy tube placement. If operative reports  are not available, further evaluation with upper GI series could be performed as indicated. 2. Gastric anatomy otherwise amenable to percutaneous gastrostomy tube placement as indicated. 3. Incidentally noted approximately 8.5 cm mixed attenuating mass arising from the anterior aspect  of the interpolar aspect of the right kidney - while potentially representative of a complex renal cyst, further evaluation with contrast-enhanced abdominal MRI is advised. 4. Nonobstructing bilateral nephrolithiasis. 5. Aortic Atherosclerosis (ICD10-I70.0). Electronically Signed   By: Sandi Mariscal M.D.   On: 02/05/2020 08:09   MR BRAIN W WO CONTRAST  Result Date: 02/05/2020 CLINICAL DATA:  Stroke suspected.  Vision loss. EXAM: MRI HEAD WITHOUT AND WITH CONTRAST TECHNIQUE: Multiplanar, multiecho pulse sequences of the brain and surrounding structures were obtained without and with intravenous contrast. CONTRAST:  6.52mL GADAVIST GADOBUTROL 1 MMOL/ML IV SOLN COMPARISON:  CT head from the same day FINDINGS: Brain: No acute infarction, hemorrhage, hydrocephalus, extra-axial collection or mass lesion. 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 No abnormal enhancement. Vascular: Major arterial flow voids are maintained at the skull base. Skull and upper cervical spine: Normal marrow signal. Right frontal scalp lipoma. Sinuses/Orbits: No substantial paranasal sinus disease. Unremarkable orbits. Other: No mastoid effusions. IMPRESSION: No acute abnormality.  Specifically, no acute infarct. Electronically Signed   By: Margaretha Sheffield MD   On: 02/05/2020 20:29   DG UGI W SINGLE CM (SOL OR THIN BA)  Result Date: 02/05/2020 CLINICAL DATA:  Abnormal CT. EXAM: WATER SOLUBLE UPPER GI SERIES TECHNIQUE: Single-column upper GI series was performed using water soluble contrast. CONTRAST:  34mL OMNIPAQUE IOHEXOL 300 MG/ML  SOLN COMPARISON:  CT scan 02/04/2020.  FLUOROSCOPY TIME:  Fluoroscopy Time:  2 minutes and 48 seconds. Radiation Exposure Index (if provided by the fluoroscopic device): 14.2 mGy Number of Acquired Spot Images: 0 FINDINGS: Study is markedly limited by difficulty with patient positioning and piecemeal swallowing. Within this limitation, there is no gross esophageal abnormality. Surgical clips in the region of the esophagogastric junction or probably related to prior fundoplication. Contrast moves freely from the esophagus into the stomach although there is poor esophageal peristalsis. Fluoro table was pushes in 30 degrees head up in the patient was rotated in both obliques, but the gastrojejunostomy seen on CT scan earlier today is anterior and the Roux limb could not be opacified given limited stomach filling and inability to position the patient upright. IMPRESSION: 1. No evidence for obstruction at the gastroesophageal junction. 2. 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 Repeat upper GI series could be performed after patient recovers from acute symptoms (preferably as an outpatient) and is better able to participate with drinking contrast and upright positioning. Electronically Signed   By: Misty Stanley M.D.   On: 02/05/2020 13:56   ECHOCARDIOGRAM COMPLETE  Result Date: 02/06/2020    ECHOCARDIOGRAM REPORT   Patient Name:   Dagoberto Reef Date of Exam: 02/06/2020 Medical Rec #:  [id number]         Height:       69.0 in Accession #:    [id number]        Weight:       136.5 lb Date of Birth:  April 11, 1940         BSA:          1.756 m Patient Age:    27 years          BP:           99/68 mmHg Patient Gender: M                 HR:  110 bpm. Exam Location:  Inpatient Procedure: 2D Echo Indications:   stroke 434.91  History:       Patient has no prior history of Echocardiogram examinations.                Cancer.  Sonographer:   Johny Chess Referring      [id number]  Rosalin Hawking Phys: IMPRESSIONS  1. Left ventricular ejection fraction, by estimation, is 55 to 60%. The left ventricle has normal function. The left ventricle has no regional wall motion abnormalities. Left ventricular diastolic parameters are consistent with Grade I diastolic dysfunction (impaired relaxation).  2. Right ventricular systolic function is normal. The right ventricular size is normal.  3. The mitral valve is normal in structure. No evidence of mitral valve regurgitation. No evidence of mitral stenosis.  4. The aortic valve is tricuspid. Aortic valve regurgitation is not visualized. Mild to moderate aortic valve sclerosis/calcification is present, without any evidence of aortic stenosis.  5. The inferior vena cava is normal in size with greater than 50% respiratory variability, suggesting right atrial pressure of 3 mmHg. FINDINGS  Left Ventricle: Left ventricular ejection fraction, by estimation, is 55 to 60%. The left ventricle has normal function. The left ventricle has no regional wall motion abnormalities. The left ventricular internal cavity size was normal in size. There is  no left ventricular hypertrophy. Left ventricular diastolic parameters are consistent with Grade I diastolic dysfunction (impaired relaxation). Right Ventricle: The right ventricular size is normal. No increase in right ventricular wall thickness. Right ventricular systolic function is normal. Left Atrium: Left atrial size was normal in size. Right Atrium: Right atrial size was normal in size. Pericardium: There is no evidence of pericardial effusion. Mitral Valve: The mitral valve is normal in structure. No evidence of mitral valve regurgitation. No evidence of mitral valve stenosis. Tricuspid Valve: The tricuspid valve is normal in structure. Tricuspid valve regurgitation is mild . No evidence of tricuspid stenosis. Aortic Valve: The aortic valve is tricuspid. Aortic valve regurgitation is not visualized. Mild to moderate  aortic valve sclerosis/calcification is present, without any evidence of aortic stenosis. Pulmonic Valve: The pulmonic valve was normal in structure. Pulmonic valve regurgitation is not visualized. No evidence of pulmonic stenosis. Aorta: The aortic root is normal in size and structure. Venous: The inferior vena cava is normal in size with greater than 50% respiratory variability, suggesting right atrial pressure of 3 mmHg. IAS/Shunts: No atrial level shunt detected by color flow Doppler.  LEFT VENTRICLE PLAX 2D LVIDd:         3.80 cm  Diastology LVIDs:         2.50 cm  LV e' medial:    5.98 cm/s LV PW:         0.90 cm  LV E/e' medial:  10.1 LV IVS:        0.70 cm  LV e' lateral:   7.94 cm/s LVOT diam:     2.00 cm  LV E/e' lateral: 7.6 LV SV:         45 LV SV Index:   26 LVOT Area:     3.14 cm  RIGHT VENTRICLE             IVC RV S prime:     13.60 cm/s  IVC diam: 1.20 cm TAPSE (M-mode): 2.1 cm LEFT ATRIUM           Index      RIGHT ATRIUM  Index LA diam:      2.40 cm 1.37 cm/m RA Area:     6.41 cm LA Vol (A4C): 15.9 ml 9.05 ml/m RA Volume:   9.99 ml  5.69 ml/m  AORTIC VALVE LVOT Vmax:   79.60 cm/s LVOT Vmean:  55.200 cm/s LVOT VTI:    0.144 m  AORTA Ao Root diam: 3.10 cm MV E velocity: 60.40 cm/s MV A velocity: 96.40 cm/s  SHUNTS MV E/A ratio:  0.63        Systemic VTI:  0.14 m                            Systemic Diam: 2.00 cm Jenkins Rouge MD Electronically signed by Jenkins Rouge MD Signature Date/Time: 02/06/2020/12:03:31 PM    Final    CT ANGIO HEAD CODE STROKE  Result Date: 02/05/2020 CLINICAL DATA:  Neuro deficits, subacute. EXAM: CT ANGIOGRAPHY HEAD AND NECK TECHNIQUE: Multidetector CT imaging of the head and neck was performed using the standard protocol during bolus administration of intravenous contrast. Multiplanar CT image reconstructions and MIPs were obtained to evaluate the vascular anatomy. Carotid stenosis measurements (when applicable) are obtained utilizing NASCET criteria, using the  distal internal carotid diameter as the denominator. CONTRAST:  37mL OMNIPAQUE IOHEXOL 350 MG/ML SOLN COMPARISON:  CT head February 04, 2019.  Same day MRI. FINDINGS: CT HEAD FINDINGS Brain: No evidence of acute large vascular territory infarction, hemorrhage, hydrocephalus, extra-axial collection or mass lesion/mass effect. 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 Vascular: Calcific atherosclerosis. Skull: No acute fracture. Sinuses: Sinuses are largely clear.  Unremarkable orbits. Orbits: No mastoid effusions. Review of the MIP images confirms the above findings CTA NECK FINDINGS Aortic arch: Atherosclerosis of the aorta. The right subclavian artery origin is not imaged. Otherwise, great vessel origins appear patent. 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 Right carotid system: No evidence of dissection, stenosis (50% or greater) or occlusion. Left carotid system: No evidence of dissection, stenosis (50% or greater) or occlusion. Mild narrowing of the common carotid artery origin. Mild atherosclerosis at the carotid bifurcation. The proximal common carotid artery abuts the anterior aspect of the left lung apex mass. Vertebral arteries: Left dominant. 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 Otherwise, no significant stenosis in the neck. Skeleton: Partially imaged thoracic fusion hardware. There is extensive invasion of the partially imaged upper thoracic spine by the left upper lung mass. Other neck: Normal thyroid. Upper chest: Large mass involving the left lung apex, which invades into the adjacent thoracic spine and mediastinum. The size and overall extent of the tumor appears progressed since prior CT chest from December 04, 2019. Review of the MIP images confirms the above findings CTA HEAD FINDINGS Anterior circulation: No significant proximal  stenosis, large vessel occlusion, aneurysm, or vascular malformation. Bilateral calcific cavernous carotid artery atherosclerosis without evidence of greater than 50% narrowing. Posterior circulation: No significant proximal stenosis, large vessel occlusion, aneurysm, or vascular malformation. The non dominant right vertebral artery makes a small contribution to the basilar artery. Venous sinuses: As permitted by contrast timing, patent. Review of the MIP images confirms the above findings IMPRESSION: 1. No emergent large vessel occlusion. 2. 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 3. Partially imaged large left apical  lung mass which invades into the adjacent thoracic spine and mediastinum. The size and overall extent of the tumor appears progressed since prior CT chest from December 04, 2019. CT of the chest with contrast could further characterize lung/mediastinal progression and MRI of the thoracic spine with contrast could further characterize thoracic (including canal) invasion if clinically indicated. Electronically Signed   By: Margaretha Sheffield MD   On: 02/05/2020 21:34   CT ANGIO NECK CODE STROKE  Result Date: 02/05/2020 CLINICAL DATA:  Neuro deficits, subacute. EXAM: CT ANGIOGRAPHY HEAD AND NECK TECHNIQUE: Multidetector CT imaging of the head and neck was performed using the standard protocol during bolus administration of intravenous contrast. Multiplanar CT image reconstructions and MIPs were obtained to evaluate the vascular anatomy. Carotid stenosis measurements (when applicable) are obtained utilizing NASCET criteria, using the distal internal carotid diameter as the denominator. CONTRAST:  64mL OMNIPAQUE IOHEXOL 350 MG/ML SOLN COMPARISON:  CT head February 04, 2019.  Same day MRI. FINDINGS: CT HEAD FINDINGS Brain: No evidence of acute large vascular territory infarction, hemorrhage, hydrocephalus, extra-axial  collection or mass lesion/mass effect. 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 Vascular: Calcific atherosclerosis. Skull: No acute fracture. Sinuses: Sinuses are largely clear.  Unremarkable orbits. Orbits: No mastoid effusions. Review of the MIP images confirms the above findings CTA NECK FINDINGS Aortic arch: Atherosclerosis of the aorta. The right subclavian artery origin is not imaged. Otherwise, great vessel origins appear patent. 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 Right carotid system: No evidence of dissection, stenosis (50% or greater) or occlusion. Left carotid system: No evidence of dissection, stenosis (50% or greater) or occlusion. Mild narrowing of the common carotid artery origin. Mild atherosclerosis at the carotid bifurcation. The proximal common carotid artery abuts the anterior aspect of the left lung apex mass. Vertebral arteries: Left dominant. 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 Otherwise, no significant stenosis in the neck. Skeleton: Partially imaged thoracic fusion hardware. There is extensive invasion of the partially imaged upper thoracic spine by the left upper lung mass. Other neck: Normal thyroid. Upper chest: Large mass involving the left lung apex, which invades into the adjacent thoracic spine and mediastinum. The size and overall extent of the tumor appears progressed since prior CT chest from December 04, 2019. Review of the MIP images confirms the above findings CTA HEAD FINDINGS Anterior circulation: No significant proximal stenosis, large vessel occlusion, aneurysm, or vascular malformation. Bilateral calcific cavernous carotid artery atherosclerosis without evidence of greater than 50% narrowing. Posterior circulation: No significant proximal stenosis, large vessel occlusion, aneurysm, or vascular  malformation. The non dominant right vertebral artery makes a small contribution to the basilar artery. Venous sinuses: As permitted by contrast timing, patent. Review of the MIP images confirms the above findings IMPRESSION: 1. No emergent large vessel occlusion. 2. 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 3. 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 The size and overall extent of the tumor appears progressed since prior CT chest from December 04, 2019. CT of the chest with contrast could further characterize lung/mediastinal progression and MRI of the thoracic spine with contrast could further characterize thoracic (including canal) invasion if clinically indicated. Electronically Signed   By: Margaretha Sheffield MD   On:  02/05/2020 21:34   Recent Labs    02/04/20 1132 02/05/20 0520  WBC 6.2 5.5  HGB 11.4* 10.5*  HCT 37.0* 32.6*  PLT 268 234   Recent Labs    02/04/20 1132 02/05/20 0520  NA 140 141  K 3.8 3.7  CL 100 102  CO2 27 29  GLUCOSE 119* 131*  BUN 21 18  CREATININE 0.80 0.78  CALCIUM 10.5* 10.3    Intake/Output Summary (Last 24 hours) at 02/06/2020 1517 Last data filed at 02/06/2020 1301 Gross per 24 hour  Intake 220 ml  Output 575 ml  Net -355 ml    Physical Exam: Vital Signs Blood pressure 116/70, pulse 90, temperature 98.4 F (36.9 C), temperature source Oral, resp. rate 16, height 5\' 9"  (1.753 m), weight 61.9 kg, SpO2 100 %. Gen: no distress, normal appearing HEENT: oral mucosa pink and moist, NCAT Cardio: Reg rate Chest: normal effort, normal rate of breathing Abd: soft, non-distended Ext: no clubbing, cyanosis, or edema- very tight in L>R upper traps- also bones more prominent -losing weight Psych: upset/aggravated- says we are trying to make him crazy".  Skin: Warm and dry.  Intact. Musc: No edema in extremities.  No  tenderness in extremities. Neuro: very confused- not even sure he was in hospital/month/year.  Motor: Bilateral upper extremities: Grossly 5/5 proximal distal Bilateral lower extremities: Grossly 4+/5 proximal to distal, stable  Assessment/Plan: 1. Functional deficits which require 3+ hours per day of interdisciplinary therapy in a comprehensive inpatient rehab setting.  Physiatrist is providing close team supervision and 24 hour management of active medical problems listed below.  Physiatrist and rehab team continue to assess barriers to discharge/monitor patient progress toward functional and medical goals  Care Tool:  Bathing    Body parts bathed by patient: Right arm,Left arm,Chest,Abdomen,Right upper leg,Left upper leg,Face,Front perineal area   Body parts bathed by helper: Buttocks,Right lower leg,Left lower leg Body parts n/a: Front perineal area,Buttocks,Right lower leg,Left lower leg   Bathing assist Assist Level: Minimal Assistance - Patient > 75%     Upper Body Dressing/Undressing Upper body dressing   What is the patient wearing?: Pull over shirt    Upper body assist Assist Level: Set up assist    Lower Body Dressing/Undressing Lower body dressing      What is the patient wearing?: Incontinence brief,Pants     Lower body assist Assist for lower body dressing: Moderate Assistance - Patient 50 - 74%     Toileting Toileting    Toileting assist Assist for toileting: Moderate Assistance - Patient 50 - 74% Assistive Device Comment: BSC   Transfers Chair/bed transfer  Transfers assist     Chair/bed transfer assist level: Minimal Assistance - Patient > 75% Chair/bed transfer assistive device: Programmer, multimedia   Ambulation assist      Assist level: Minimal Assistance - Patient > 75% Assistive device: Walker-rolling Max distance: 50'   Walk 10 feet activity   Assist     Assist level: Minimal Assistance - Patient > 75% Assistive  device: Walker-rolling   Walk 50 feet activity   Assist Walk 50 feet with 2 turns activity did not occur: Safety/medical concerns  Assist level: Minimal Assistance - Patient > 75% Assistive device: Walker-rolling    Walk 150 feet activity   Assist Walk 150 feet activity did not occur: Safety/medical concerns         Walk 10 feet on uneven surface  activity   Assist Walk 10 feet on uneven  surfaces activity did not occur: Safety/medical concerns         Wheelchair     Assist Will patient use wheelchair at discharge?: Yes Type of Wheelchair: Manual    Wheelchair assist level: Supervision/Verbal cueing Max wheelchair distance: 100'    Wheelchair 50 feet with 2 turns activity    Assist        Assist Level: Supervision/Verbal cueing   Wheelchair 150 feet activity     Assist      Assist Level: Moderate Assistance - Patient 50 - 74%   Blood pressure 116/70, pulse 90, temperature 98.4 F (36.9 C), temperature source Oral, resp. rate 16, height 5\' 9"  (1.753 m), weight 61.9 kg, SpO2 100 %.  Medical Problem List and Plan: 1.  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  1/7- spoke to brother yesterday who said he'd pass on to daughter- sounds like he didn't- palliative care to see pt  1/10- palliative saw pt- is DNAR/DNI now  1/11- called Oncology on call- trying to figure out plan for treatment- was told 12/27 that would do radiation and then immediately do Chemo, however, due to dispo issues (family cannot take home) needs to go to SNF after radiation- spoke at length to Dr Burr Medico- she or Dr Julien Nordmann will see pt today/tomorrow.   1/12- Cannot do PET scan until  d/c form hospital. Dr Tommy Medal to see pt Friday  Continue CIR  2.  Antithrombotics: -DVT/anticoagulation:   Heparin.  Mechanical: Sequential compression devices, below knee Bilateral lower extremities   Dopplers negative for DVT  -antiplatelet therapy: N/A 3. Pain Management:    Decreased oxycodone to q4H prn.  DC gabapentin since causing constipation  Added Cymbalta 30 mg daily.   1/3- will increase Duloxetine to 60 mg nightly- for nerve pain  1/5- not taking pain meds often- encouraged pt to take pain meds before therapy.   1/6- will reduce oxy to Norco q4 hours prn due to increased confusion per family (nursing hasn't seen it)  with pain meds  1/9 pt told me his pain is under control presently  1/10- will place lidoderm patches on shoulders/upper traps B/L where muscles REAL tight.   1/11- can't have lidoderm in >3 patches, but refused yesterday- will check into this.  1/12- saw after left room, pt refused ALL meds this AM-   1/13- no pain meds in 2-4 days- tramadol or Norco 1/15: d/c Norco since patient is not requiring 4. Mood: LCSW to follow for evaluation and support.              -antipsychotic agents: N/a  Discussed with nursing, he has been sleeping well.  5. Neuropsych: This patient is capable of making decisions on his own behalf.  Appreciate neuropsych 6. Skin/Wound Care: Routine pressure relief measures.  7. Fluids/Electrolytes/Nutrition: Monitor I/Os.             Added juven to promote healing.  8. Neurogenic bladder: Foley in place due to urinary retention/UTI, with plans to remove on Monday  Flomax daily initiated on 12/30, monitor blood pressure closely  Florinef 0.1 mg daily initiated to assist with low BP.   Improving on 1/2  1/3- will d/c foley  this afternoon, after radiation  1/4- wasn't removed- will remove today- this AM and ordered I/o caths and PVRs since might not void.  1/5- so far, no voiding- cathing required 100%   1/6-9- no voiding- caths volumes 300-600c  q6 hours  1/10- no voiding- in/out caths being done- pt doesn't want foley back.  1/11- no voiding still  1/12- no voiding- doing I/o caths- nursing doing- will need foley when goes to SNF  9.  Acute renal failure: Resolved  Creatinine 0.86 on 12/27  10. Acute blood loss anemia:   Hemoglobin 12.4 on 12/28  Continue to monitor 11. Metastatic Lung AdenoCA:  On dulera--has been on supplemental oxygen  1/4- didn't get radiation yesterday- is scheduled ot go back today.  1/6- Getting radiation daily M-F  1/13- Dr Julien Nordmann to see Friday- cannot get PET scan since inpt- explained to family.   12. Kleb/Proteus UTI: Completed course of Cipro 13. 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  1/5- refused bowel program last night  And as a result had accident this AM.   1/6- hard clumps of stool- will add Miralax daily.   1/7- hard clumps- just added miralax so will wait to increase til this weekend.   1/9 pt had small pebbles with bp last night. Will increase miralax to bid   -add metamucil   1/10- added Sorbitol today x1- and follow with suppository  1/11- pt had good BM last night.    1/12- will give enema tonight per request of nursing  1/15: had large BM yesterday- continue bowel program 14. Hypotension/tachycardia-   Severe orthostasis, negative on 12/31  Relatively controlled on 1/2  TEDs/abdominal binder for support.   1/5- added midodrine 5 mg TID-AC for low Bps  1/7- increased Midodrine to 10 mg TID- will also increase Florinef to help with orthostatic hypotension. It's really bad when goes to radiation  1/9 bp more robust with  recent changes  1/12- doing better today per OT, but then refused all AM meds-   1/13- tachycardia to 120s- will check EKG and labs to see if dehydrated-Last BUN 25- will give IVFs D5NS 75cc/hour x 1 days and monitor  1/15: BP better controlled  15.  Sleep disturbance: Scheduled melatonin at nights.   Trazodone 100  1/3- sleeping well- con't regimen 17. Tenting/mild dehydration- will push 6 cups/water daily minimum  1/6- skin very dry- order eucerin AND push PO fluids 18. Hyperkalemia  Lokelma ordered on 12/23  Potassium 4.6 on 12/27 19. Pancoast tumor/ metastatic lung cancer:   Radiation to begin on 1/3 and to see Dr Julien Nordmann 1/3 at 2:15 pm  1/4- didn't get radiation- will make sure scheduled to  go back today  1/5- made sure pt went to radiation -his BP was low- but gave him 500cc IVFs NS bolus to help with BP- BP was 82/50s.   1/7- Increased Midodrine and florienf to help with low BP.   1/14: radiation today/ 20. Dysphagia per pt  12/30- ordered SLP eval and treat  1/3- placed on D3 thin diet  1/4- is better when sitting  up at 90 degrees  1/6- will add Protonix for reflux Sx's.   1/8 MBS performed 1/7--mod oral/mild pharyng dysphagia   -D1, thins recommended by SLP, meds with puree  1/10-  Pt says doesn't help much with D1 thins.   1/12- per SLP, working on D2, but without teeth, cannot do D2 right now until passed trials 21. Hypercalcemia  1/4- is down to 11 from 11.2- will monitor- if goes back up more, will treat. 22. Poor appetite  1/10- will try Marinol 2.5 mg BID  1/12- not working as of yet- placed nutrition consult since not taking supplements - "too sticky".  22. Depression  1/11- on Cymbalta but cannot add Wellbutrin- has allergy- will put small dose of Celexa 10 mg daily on board-so he doesn't have issues with serotonin syndrome.   1/12- think it's the reason pt refused meds this AM, encouraged pt to take his meds for low BP, depression, etc.   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  1/13- want to make sure doesn't have stroke- will check head CT asap. Also called Ophtho- they said call back when confusion better and will examine.  Until then, cannot.  24. Confusion  1/13- could be marinol, since not receiving pain meds- will stop marinol 25. Severe malnutrition  1/13- has lost 16 lbs in last 3 weeks from 12/21- d/w family about a PEG-  And they agreed- cannot get from IR because hx of hernia repair with mesh- will call gen surgery.  26. Gastrojejunal anastamosis: Upper Gi series planned.  27. Diastolic dysfunction, grade 1: 1/14 ECHO reviewed   LOS: 25 days A FACE TO FACE EVALUATION WAS [place] 02/06/2020, 3:17 PM

## 2020-02-06 NOTE — Progress Notes (Signed)
Patient transferred to MRI for additional xray, denies pain no acute distress or complaints. Remain on nasal oxygen. s

## 2020-02-06 NOTE — Progress Notes (Signed)
Physical Therapy Session Note  Patient Details  Name: Gregory Bautista MRN: [id number] Date of Birth: 1940-03-17  Today's Date: 02/06/2020 PT Individual Time: 1510-1600 PT Individual Time Calculation (min): 50 min   Short Term Goals:  Week 3:  PT Short Term Goal 1 (Week 3): =LTG due to ELOS PT Short Term Goal 1 - Progress (Week 3): Progressing toward goal Week 4:  PT Short Term Goal 1 (Week 4): =LTG due to ELOS      Skilled Therapeutic Interventions/Progress Updates:   Pt resting in bed.  He stated that LUE ached, not rated.  PT checked with Mekides, RN about pt, as he had an EKG earlier today; she stated that he has no new orders.  neuromuscular re-education via demo and multimodal cues for supine- bil adductor squeezes with pelvic tilts, bil bridging, R/L straight leg raises focusing on eccentric control.  PT donned ACES bil LEs and abdominal binder.BP in supine 110/66, HR 54; O2 sats 99% on 1.5 L O2 via [place].  Supine> sit to L with cues for spinal precautions, min assist.  BP 116/69; HR 96 in sitting   stand/pivot to wc with with RW, CGA.  W/c propulsion using bil UEs and bil LEs, x 50' with turns, supervision.  Gait training on straight path on level tile, RW, x 55' on 1L O2. Vitals after ambulating 123/76, HR 103, O2 sats 92% quickly rising to 94%.  Pt fatigued and asked to return to bed.  Stand pivot transfer as above.  Sit> supine with CGA.  At end of session, pt resting in bed iwht needs at hand and bed alarm set.  PT informed Mekides, RN that pt was on 1L O2 in room.     Therapy Documentation Precautions:  Precautions Precautions: Back,Fall Precaution Comments: reviewed precautions Restrictions Weight Bearing Restrictions: No         Therapy/Group: Individual Therapy  Ngan Qualls 02/06/2020, 4:21 PM

## 2020-02-07 ENCOUNTER — Inpatient Hospital Stay (HOSPITAL_COMMUNITY): Payer: Medicare Other | Admitting: Physical Therapy

## 2020-02-07 ENCOUNTER — Inpatient Hospital Stay (HOSPITAL_COMMUNITY): Payer: Medicare Other | Admitting: Occupational Therapy

## 2020-02-07 ENCOUNTER — Inpatient Hospital Stay (HOSPITAL_COMMUNITY): Payer: Medicare Other | Admitting: Speech Pathology

## 2020-02-07 NOTE — Progress Notes (Signed)
Occupational Therapy Session Note  Patient Details  Name: Gregory Bautista MRN: [id number] Date of Birth: 10/08/1940  Today's Date: 02/07/2020 OT Individual Time: 1120-1200 OT Individual Time Calculation (min): 40 min   Skilled Therapeutic Interventions/Progress Updates:    Pt greeted in bed with no c/o pain and initially agreeable to get OOB to shave at the sink. OT donned his thigh high Teds and ACE wraps. Retrieved needed items for shaving and set up his abdominal binder. Pt perseverating on his wrist bands and that his name should not be written on them. Pt with nonsensical speech throughout tx, repeating that he needed to be "both at the front and the back," adamant that he had to keep explaining this to OT, stating: "I know you don't understand and I don't understand." Facilitated logroll for supine<sit with pt continuing with his nonsensical speech and ultimately unable to be redirected to task of getting OOB. Pt returned to supine position and boosted himself up in bed using UEs/LEs with bed placed in trendelenburg position. Pt then reported he had to use the restroom. He was left in care of NT for toileting needs.   Therapy Documentation Precautions:  Precautions Precautions: Back,Fall Precaution Comments: reviewed precautions Restrictions Weight Bearing Restrictions: No ADL: ADL Eating: Set up Grooming: Setup Where Assessed-Grooming: Sitting at sink Upper Body Bathing: Supervision/safety Where Assessed-Upper Body Bathing: Sitting at sink Lower Body Bathing: Moderate assistance Where Assessed-Lower Body Bathing: Standing at sink,Sitting at sink Upper Body Dressing: Supervision/safety Where Assessed-Upper Body Dressing: Sitting at sink Lower Body Dressing: Moderate assistance Where Assessed-Lower Body Dressing: Wheelchair Toileting: Moderate assistance Where Assessed-Toileting: Glass blower/designer: Minimal Print production planner Method: Stand Engineer, civil (consulting): Raised toilet seat      Therapy/Group: Individual Therapy  Mickel Schreur A Holston Oyama 02/07/2020, 12:49 PM

## 2020-02-07 NOTE — Progress Notes (Signed)
Physical Therapy Session Note  Patient Details  Name: Gregory Bautista MRN: [id number] Date of Birth: April 12, 1940  Today's Date: 02/07/2020 PT Amount of Missed Time (min): 64 Minutes PT Missed Treatment Reason: Unavailable (Comment) (eating lunch)   Short Term Goals: Week 4:  PT Short Term Goal 1 (Week 4): =LTG due to ELOS  Skilled Therapeutic Interventions/Progress Updates:    Attempted to see patient for scheduled therapy session. Pt seated in bed eating lunch. Encouraged pt to get up to wheelchair in order to sit up OOB for his meal, pt begins tangential conservation and requires redirection. Pt declines any OOB mobility at this time. Pt left seated in bed with needs in reach, bed alarm in place. Pt missed 45 min of scheduled therapy session due to being unavailable as he was eating lunch.  Therapy Documentation Precautions:  Precautions Precautions: Back,Fall Precaution Comments: reviewed precautions Restrictions Weight Bearing Restrictions: No General: PT Amount of Missed Time (min): 45 Minutes PT Missed Treatment Reason: Unavailable (Comment) (eating lunch)    Therapy/Group: Individual Therapy   Excell Seltzer, PT, DPT  02/07/2020, 1:42 PM

## 2020-02-07 NOTE — Progress Notes (Signed)
Speech Language Pathology Daily Session Note  Patient Details  Name: Marshall Kampf MRN: [id number] Date of Birth: 1940-01-28  Today's Date: 02/07/2020 SLP Individual Time: 0820-0900 SLP Individual Time Calculation (min): 40 min  Short Term Goals: Week 2: SLP Short Term Goal 1 (Week 2): Pt will consume current diet with use of swallow and esphogeal strategies mod I.  Skilled Therapeutic Interventions:   Pt was seen for skilled ST targeting dysphagia goals.  Upon arrival, pt was sitting partially reclined in bed, awake, alert, and agreeable to participating in treatment.  Pt reported that he was having difficulty drinking his orange juice, stating that it just "kept wanting to come back up."  SLP repositioned pt in bed to a more upright posture to maximize safety with PO intake.  Pt was open to trials of dys 2 textures to assess potential to progress diet.  Pt needed min cues to alternate between solids and liquids to mitigate globus sensation and demonstrated no overt s/s of aspiration with solids or liquids.  Pt was reluctant to trial any other compensatory swallowing strategies for fear that it would exacerbate his current swallowing difficulties, understandably so.  Recommend that pt remain on his currently prescribed diet.  Continue per current plan of care.    Pain Pain Assessment Pain Scale: 0-10 Pain Score: 0-No pain  Therapy/Group: Individual Therapy  Jasamine Pottinger, Selinda Orion 02/07/2020, 1:43 PM

## 2020-02-07 NOTE — Progress Notes (Signed)
[place] PHYSICAL MEDICINE & REHABILITATION PROGRESS NOTE   Subjective/Complaints: Gregory Bautista is sleeping peacefully this morning, I did not arouse him. Nursing notes reviewed- he refused bowel and bladder program last night but had large continent BM yesterday afternoon.  ROS:  Limited by cognition  Objective:   MR BRAIN W WO CONTRAST  Result Date: 02/05/2020 CLINICAL DATA:  Stroke suspected.  Vision loss. EXAM: MRI HEAD WITHOUT AND WITH CONTRAST TECHNIQUE: Multiplanar, multiecho pulse sequences of the brain and surrounding structures were obtained without and with intravenous contrast. CONTRAST:  6.41mL GADAVIST GADOBUTROL 1 MMOL/ML IV SOLN COMPARISON:  CT head from the same day FINDINGS: Brain: No acute infarction, hemorrhage, hydrocephalus, extra-axial collection or mass lesion. 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 No abnormal enhancement. Vascular: Major arterial flow voids are maintained at the skull base. Skull and upper cervical spine: Normal marrow signal. Right frontal scalp lipoma. Sinuses/Orbits: No substantial paranasal sinus disease. Unremarkable orbits. Other: No mastoid effusions. IMPRESSION: No acute abnormality.  Specifically, no acute infarct. Electronically Signed   By: Margaretha Sheffield MD   On: 02/05/2020 20:29   DG UGI W SINGLE CM (SOL OR THIN BA)  Result Date: 02/05/2020 CLINICAL DATA:  Abnormal CT. EXAM: WATER SOLUBLE UPPER GI SERIES TECHNIQUE: Single-column upper GI series was performed using water soluble contrast. CONTRAST:  54mL OMNIPAQUE IOHEXOL 300 MG/ML  SOLN COMPARISON:  CT scan 02/04/2020. FLUOROSCOPY TIME:  Fluoroscopy Time:  2 minutes and 48 seconds. Radiation Exposure Index (if provided by the fluoroscopic device): 14.2 mGy Number of Acquired Spot Images: 0 FINDINGS: Study is markedly limited by difficulty with patient positioning and piecemeal swallowing. Within this  limitation, there is no gross esophageal abnormality. Surgical clips in the region of the esophagogastric junction or probably related to prior fundoplication. Contrast moves freely from the esophagus into the stomach although there is poor esophageal peristalsis. Fluoro table was pushes in 30 degrees head up in the patient was rotated in both obliques, but the gastrojejunostomy seen on CT scan earlier today is anterior and the Roux limb could not be opacified given limited stomach filling and inability to position the patient upright. IMPRESSION: 1. No evidence for obstruction at the gastroesophageal junction. 2. 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 Repeat upper GI series could be performed after patient recovers from acute symptoms (preferably as an outpatient) and is better able to participate with drinking contrast and upright positioning. Electronically Signed   By: Misty Stanley M.D.   On: 02/05/2020 13:56   ECHOCARDIOGRAM COMPLETE  Result Date: 02/06/2020    ECHOCARDIOGRAM REPORT   Patient Name:   Gregory Bautista Date of Exam: 02/06/2020 Medical Rec #:  [id number]         Height:       69.0 in Accession #:    [id number]        Weight:       136.5 lb Date of Birth:  08-Oct-1940         BSA:          1.756 m Patient Age:    80 years          BP:           99/68 mmHg Patient Gender: M                 HR:  110 bpm. Exam Location:  Inpatient Procedure: 2D Echo Indications:   stroke 434.91  History:       Patient has no prior history of Echocardiogram examinations.                Cancer.  Sonographer:   Johny Chess Referring      [id number] Rosalin Hawking Phys: IMPRESSIONS  1. Left ventricular ejection fraction, by estimation, is 55 to 60%. The left ventricle has normal function. The left ventricle has no regional wall motion abnormalities. Left ventricular diastolic parameters are consistent with Grade I diastolic  dysfunction (impaired relaxation).  2. Right ventricular systolic function is normal. The right ventricular size is normal.  3. The mitral valve is normal in structure. No evidence of mitral valve regurgitation. No evidence of mitral stenosis.  4. The aortic valve is tricuspid. Aortic valve regurgitation is not visualized. Mild to moderate aortic valve sclerosis/calcification is present, without any evidence of aortic stenosis.  5. The inferior vena cava is normal in size with greater than 50% respiratory variability, suggesting right atrial pressure of 3 mmHg. FINDINGS  Left Ventricle: Left ventricular ejection fraction, by estimation, is 55 to 60%. The left ventricle has normal function. The left ventricle has no regional wall motion abnormalities. The left ventricular internal cavity size was normal in size. There is  no left ventricular hypertrophy. Left ventricular diastolic parameters are consistent with Grade I diastolic dysfunction (impaired relaxation). Right Ventricle: The right ventricular size is normal. No increase in right ventricular wall thickness. Right ventricular systolic function is normal. Left Atrium: Left atrial size was normal in size. Right Atrium: Right atrial size was normal in size. Pericardium: There is no evidence of pericardial effusion. Mitral Valve: The mitral valve is normal in structure. No evidence of mitral valve regurgitation. No evidence of mitral valve stenosis. Tricuspid Valve: The tricuspid valve is normal in structure. Tricuspid valve regurgitation is mild . No evidence of tricuspid stenosis. Aortic Valve: The aortic valve is tricuspid. Aortic valve regurgitation is not visualized. Mild to moderate aortic valve sclerosis/calcification is present, without any evidence of aortic stenosis. Pulmonic Valve: The pulmonic valve was normal in structure. Pulmonic valve regurgitation is not visualized. No evidence of pulmonic stenosis. Aorta: The aortic root is normal in size and  structure. Venous: The inferior vena cava is normal in size with greater than 50% respiratory variability, suggesting right atrial pressure of 3 mmHg. IAS/Shunts: No atrial level shunt detected by color flow Doppler.  LEFT VENTRICLE PLAX 2D LVIDd:         3.80 cm  Diastology LVIDs:         2.50 cm  LV e' medial:    5.98 cm/s LV PW:         0.90 cm  LV E/e' medial:  10.1 LV IVS:        0.70 cm  LV e' lateral:   7.94 cm/s LVOT diam:     2.00 cm  LV E/e' lateral: 7.6 LV SV:         45 LV SV Index:   26 LVOT Area:     3.14 cm  RIGHT VENTRICLE             IVC RV S prime:     13.60 cm/s  IVC diam: 1.20 cm TAPSE (M-mode): 2.1 cm LEFT ATRIUM           Index      RIGHT ATRIUM  Index LA diam:      2.40 cm 1.37 cm/m RA Area:     6.41 cm LA Vol (A4C): 15.9 ml 9.05 ml/m RA Volume:   9.99 ml  5.69 ml/m  AORTIC VALVE LVOT Vmax:   79.60 cm/s LVOT Vmean:  55.200 cm/s LVOT VTI:    0.144 m  AORTA Ao Root diam: 3.10 cm MV E velocity: 60.40 cm/s MV A velocity: 96.40 cm/s  SHUNTS MV E/A ratio:  0.63        Systemic VTI:  0.14 m                            Systemic Diam: 2.00 cm Jenkins Rouge MD Electronically signed by Jenkins Rouge MD Signature Date/Time: 02/06/2020/12:03:31 PM    Final    CT ANGIO HEAD CODE STROKE  Result Date: 02/05/2020 CLINICAL DATA:  Neuro deficits, subacute. EXAM: CT ANGIOGRAPHY HEAD AND NECK TECHNIQUE: Multidetector CT imaging of the head and neck was performed using the standard protocol during bolus administration of intravenous contrast. Multiplanar CT image reconstructions and MIPs were obtained to evaluate the vascular anatomy. Carotid stenosis measurements (when applicable) are obtained utilizing NASCET criteria, using the distal internal carotid diameter as the denominator. CONTRAST:  45mL OMNIPAQUE IOHEXOL 350 MG/ML SOLN COMPARISON:  CT head February 04, 2019.  Same day MRI. FINDINGS: CT HEAD FINDINGS Brain: No evidence of acute large vascular territory infarction, hemorrhage, hydrocephalus,  extra-axial collection or mass lesion/mass effect. 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 Vascular: Calcific atherosclerosis. Skull: No acute fracture. Sinuses: Sinuses are largely clear.  Unremarkable orbits. Orbits: No mastoid effusions. Review of the MIP images confirms the above findings CTA NECK FINDINGS Aortic arch: Atherosclerosis of the aorta. The right subclavian artery origin is not imaged. Otherwise, great vessel origins appear patent. 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 Right carotid system: No evidence of dissection, stenosis (50% or greater) or occlusion. Left carotid system: No evidence of dissection, stenosis (50% or greater) or occlusion. Mild narrowing of the common carotid artery origin. Mild atherosclerosis at the carotid bifurcation. The proximal common carotid artery abuts the anterior aspect of the left lung apex mass. Vertebral arteries: Left dominant. 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 Otherwise, no significant stenosis in the neck. Skeleton: Partially imaged thoracic fusion hardware. There is extensive invasion of the partially imaged upper thoracic spine by the left upper lung mass. Other neck: Normal thyroid. Upper chest: Large mass involving the left lung apex, which invades into the adjacent thoracic spine and mediastinum. The size and overall extent of the tumor appears progressed since prior CT chest from December 04, 2019. Review of the MIP images confirms the above findings CTA HEAD FINDINGS Anterior circulation: No significant proximal stenosis, large vessel occlusion, aneurysm, or vascular malformation. Bilateral calcific cavernous carotid artery atherosclerosis without evidence of greater than 50% narrowing. Posterior circulation: No significant proximal stenosis, large vessel occlusion, aneurysm, or  vascular malformation. The non dominant right vertebral artery makes a small contribution to the basilar artery. Venous sinuses: As permitted by contrast timing, patent. Review of the MIP images confirms the above findings IMPRESSION: 1. No emergent large vessel occlusion. 2. 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 3. Partially imaged large left apical  lung mass which invades into the adjacent thoracic spine and mediastinum. The size and overall extent of the tumor appears progressed since prior CT chest from December 04, 2019. CT of the chest with contrast could further characterize lung/mediastinal progression and MRI of the thoracic spine with contrast could further characterize thoracic (including canal) invasion if clinically indicated. Electronically Signed   By: Margaretha Sheffield MD   On: 02/05/2020 21:34   CT ANGIO NECK CODE STROKE  Result Date: 02/05/2020 CLINICAL DATA:  Neuro deficits, subacute. EXAM: CT ANGIOGRAPHY HEAD AND NECK TECHNIQUE: Multidetector CT imaging of the head and neck was performed using the standard protocol during bolus administration of intravenous contrast. Multiplanar CT image reconstructions and MIPs were obtained to evaluate the vascular anatomy. Carotid stenosis measurements (when applicable) are obtained utilizing NASCET criteria, using the distal internal carotid diameter as the denominator. CONTRAST:  62mL OMNIPAQUE IOHEXOL 350 MG/ML SOLN COMPARISON:  CT head February 04, 2019.  Same day MRI. FINDINGS: CT HEAD FINDINGS Brain: No evidence of acute large vascular territory infarction, hemorrhage, hydrocephalus, extra-axial collection or mass lesion/mass effect. 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 Vascular: Calcific atherosclerosis. Skull: No acute fracture. Sinuses: Sinuses are largely clear.  Unremarkable orbits. Orbits: No  mastoid effusions. Review of the MIP images confirms the above findings CTA NECK FINDINGS Aortic arch: Atherosclerosis of the aorta. The right subclavian artery origin is not imaged. Otherwise, great vessel origins appear patent. 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 Right carotid system: No evidence of dissection, stenosis (50% or greater) or occlusion. Left carotid system: No evidence of dissection, stenosis (50% or greater) or occlusion. Mild narrowing of the common carotid artery origin. Mild atherosclerosis at the carotid bifurcation. The proximal common carotid artery abuts the anterior aspect of the left lung apex mass. Vertebral arteries: Left dominant. 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 Otherwise, no significant stenosis in the neck. Skeleton: Partially imaged thoracic fusion hardware. There is extensive invasion of the partially imaged upper thoracic spine by the left upper lung mass. Other neck: Normal thyroid. Upper chest: Large mass involving the left lung apex, which invades into the adjacent thoracic spine and mediastinum. The size and overall extent of the tumor appears progressed since prior CT chest from December 04, 2019. Review of the MIP images confirms the above findings CTA HEAD FINDINGS Anterior circulation: No significant proximal stenosis, large vessel occlusion, aneurysm, or vascular malformation. Bilateral calcific cavernous carotid artery atherosclerosis without evidence of greater than 50% narrowing. Posterior circulation: No significant proximal stenosis, large vessel occlusion, aneurysm, or vascular malformation. The non dominant right vertebral artery makes a small contribution to the basilar artery. Venous sinuses: As permitted by contrast timing, patent. Review of the MIP images confirms the above findings IMPRESSION: 1. No emergent large vessel occlusion. 2. Approximately  40-50% narrowing of the left vertebral artery proximally where it is encased by the left lung apex tumor. 3. 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 The size and overall extent of the tumor appears progressed since prior CT chest from December 04, 2019. CT of the chest with contrast could further characterize lung/mediastinal progression and MRI of the thoracic spine with contrast could further characterize thoracic (including canal) invasion if clinically indicated. Electronically Signed   By: Margaretha Sheffield MD   On:  02/05/2020 21:34   Recent Labs    02/04/20 1132 02/05/20 0520  WBC 6.2 5.5  HGB 11.4* 10.5*  HCT 37.0* 32.6*  PLT 268 234   Recent Labs    02/04/20 1132 02/05/20 0520  NA 140 141  K 3.8 3.7  CL 100 102  CO2 27 29  GLUCOSE 119* 131*  BUN 21 18  CREATININE 0.80 0.78  CALCIUM 10.5* 10.3    Intake/Output Summary (Last 24 hours) at 02/07/2020 1029 Last data filed at 02/07/2020 0900 Gross per 24 hour  Intake 220 ml  Output 600 ml  Net -380 ml    Physical Exam: Vital Signs Blood pressure 121/77, pulse (!) 108, temperature 98 F (36.7 C), temperature source Oral, resp. rate 16, height 5\' 9"  (1.753 m), weight 61.9 kg, SpO2 100 %. Gen: no distress, normal appearing HEENT: oral mucosa pink and moist, NCAT Cardio: Tachycardic Chest: normal effort, normal rate of breathing Abd: soft, non-distended Ext: no clubbing, cyanosis, or edema- very tight in L>R upper traps- also bones more prominent -losing weight Psych: upset/aggravated- says we are trying to make him crazy".  Skin: Warm and dry.  Intact. Musc: No edema in extremities.  No tenderness in extremities. Neuro: very confused- not even sure he was in hospital/month/year.  Motor: Bilateral upper extremities: Grossly 5/5 proximal distal Bilateral lower extremities: Grossly 4+/5 proximal to distal, stable  Assessment/Plan: 1. Functional deficits  which require 3+ hours per day of interdisciplinary therapy in a comprehensive inpatient rehab setting.  Physiatrist is providing close team supervision and 24 hour management of active medical problems listed below.  Physiatrist and rehab team continue to assess barriers to discharge/monitor patient progress toward functional and medical goals  Care Tool:  Bathing    Body parts bathed by patient: Right arm,Left arm,Chest,Abdomen,Right upper leg,Left upper leg,Face,Front perineal area   Body parts bathed by helper: Buttocks,Right lower leg,Left lower leg Body parts n/a: Front perineal area,Buttocks,Right lower leg,Left lower leg   Bathing assist Assist Level: Minimal Assistance - Patient > 75%     Upper Body Dressing/Undressing Upper body dressing   What is the patient wearing?: Pull over shirt    Upper body assist Assist Level: Set up assist    Lower Body Dressing/Undressing Lower body dressing      What is the patient wearing?: Incontinence brief,Pants     Lower body assist Assist for lower body dressing: Moderate Assistance - Patient 50 - 74%     Toileting Toileting    Toileting assist Assist for toileting: Moderate Assistance - Patient 50 - 74% Assistive Device Comment: BSC   Transfers Chair/bed transfer  Transfers assist     Chair/bed transfer assist level: Contact Guard/Touching assist Chair/bed transfer assistive device: Programmer, multimedia   Ambulation assist      Assist level: Contact Guard/Touching assist Assistive device: Walker-rolling Max distance: 55   Walk 10 feet activity   Assist     Assist level: Contact Guard/Touching assist Assistive device: Walker-rolling   Walk 50 feet activity   Assist Walk 50 feet with 2 turns activity did not occur: Safety/medical concerns  Assist level: Minimal Assistance - Patient > 75% Assistive device: Walker-rolling    Walk 150 feet activity   Assist Walk 150 feet activity did  not occur: Safety/medical concerns         Walk 10 feet on uneven surface  activity   Assist Walk 10 feet on uneven surfaces activity did not occur: Safety/medical concerns  Wheelchair     Assist Will patient use wheelchair at discharge?: Yes Type of Wheelchair: Manual    Wheelchair assist level: Supervision/Verbal cueing Max wheelchair distance: 50    Wheelchair 50 feet with 2 turns activity    Assist        Assist Level: Supervision/Verbal cueing   Wheelchair 150 feet activity     Assist      Assist Level: Moderate Assistance - Patient 50 - 74%   Blood pressure 121/77, pulse (!) 108, temperature 98 F (36.7 C), temperature source Oral, resp. rate 16, height 5\' 9"  (1.753 m), weight 61.9 kg, SpO2 100 %.  Medical Problem List and Plan: 1.  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  1/7- spoke to brother yesterday who said he'd pass on to daughter- sounds like he didn't- palliative care to see pt  1/10- palliative saw pt- is DNAR/DNI now  1/11- called Oncology on call- trying to figure out plan for treatment- was told 12/27 that would do radiation and then immediately do Chemo, however, due to dispo issues (family cannot take home) needs to go to SNF after radiation- spoke at length to Dr Burr Medico- she or Dr Julien Nordmann will see pt today/tomorrow.   1/12- Cannot do PET scan until d/c form hospital. Dr Tommy Medal to see pt Friday  Continue CIR  2.  Antithrombotics: -DVT/anticoagulation:   Continue Heparin.  Mechanical: Sequential compression devices, below knee Bilateral lower extremities   Dopplers negative for DVT  -antiplatelet therapy: N/A 3. Pain Management:     Decreased oxycodone to q4H prn.  DC gabapentin since causing constipation  Added Cymbalta 30 mg daily.   1/3- will increase Duloxetine to 60 mg nightly- for nerve pain  1/5- not taking pain meds often- encouraged pt to take pain meds before therapy.   1/6- will reduce oxy to Norco q4 hours prn due to increased confusion per family (nursing hasn't seen it)  with pain meds  1/9 pt told me his pain is under control presently  1/10- will place lidoderm patches on shoulders/upper traps B/L where muscles REAL tight.   1/11- can't have lidoderm in >3 patches, but refused yesterday- will check into this.  1/12- saw after left room, pt refused ALL meds this AM-   1/13- no pain meds in 2-4 days- tramadol or Norco 1/15: d/c Norco since patient is not requiring 4. Mood: LCSW to follow for evaluation and support.              -antipsychotic agents: N/a  Discussed with nursing, he has been sleeping well.  5. Neuropsych: This patient is capable of making decisions on his own behalf.  Appreciate neuropsych 6. Skin/Wound Care: Routine pressure relief measures.  7. Fluids/Electrolytes/Nutrition: Monitor I/Os.             Added juven to promote healing.  8. Neurogenic bladder: Foley in place due to urinary retention/UTI, with plans to remove on Monday  Flomax daily initiated on 12/30, monitor blood pressure closely  Florinef 0.1 mg daily initiated to assist with low BP.   Improving on 1/2  1/3- will d/c foley this afternoon, after radiation  1/4- wasn't removed- will remove today- this AM  and ordered I/o caths and PVRs since might not void.  1/5- so far, no voiding- cathing required 100%   1/6-9- no voiding- caths volumes 300-600c q6 hours  1/10- no voiding- in/out caths being done- pt doesn't want foley back.  1/11- no voiding still  1/12- no voiding- doing I/o caths- nursing doing- will need foley when goes to SNF  9.  Acute renal failure: Resolved  Creatinine 0.86 on 12/27, 0.78 on 1/14, repeat  weekly.  10. Acute blood loss anemia:   Hemoglobin 12.4 on 12/28, 10.5 on 1/14, repeat weekly.   Continue to monitor 11. Metastatic Lung AdenoCA:  On dulera--has been on supplemental oxygen  1/4- didn't get radiation yesterday- is scheduled ot go back today.  1/6- Getting radiation daily M-F  1/13- Dr Julien Nordmann to see Friday- cannot get PET scan since inpt- explained to family.   12. Kleb/Proteus UTI: Completed course of Cipro 13. 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  1/5- refused bowel program last night  And as a result had accident this AM.   1/6- hard clumps of stool- will add Miralax daily.   1/7- hard clumps- just added miralax so will wait to increase til this weekend.   1/9 pt had small pebbles with bp last night. Will increase miralax to bid   -add metamucil   1/10- added Sorbitol today x1- and follow with suppository  1/11- pt had good BM last night.    1/12- will give enema tonight per request of nursing  1/15: had large BM yesterday- continue bowel program  1/16: Had BM yesterday 14. Hypotension/tachycardia-   Severe orthostasis, negative on 12/31  Relatively controlled on 1/2  TEDs/abdominal binder for support.   1/5- added midodrine 5 mg TID-AC for low Bps  1/7- increased Midodrine to 10 mg TID- will also increase Florinef to help with orthostatic hypotension. It's really bad when goes to radiation  1/9 bp more robust with recent changes  1/12- doing better today per OT, but then refused all AM meds-   1/13- tachycardia to 120s- will check EKG and labs to see if dehydrated-Last BUN 25- will give IVFs D5NS  75cc/hour x 1 days and monitor  1/15: BP better controlled  15.  Sleep disturbance: Scheduled melatonin at nights.   Trazodone 100  1/3- sleeping well- con't regimen 17. Tenting/mild dehydration- will push 6 cups/water daily minimum  1/6- skin very dry- order eucerin AND push PO fluids 18. Hyperkalemia  Lokelma ordered on 12/23  Potassium 4.6 on 12/27 19. Pancoast tumor/ metastatic lung cancer:   Radiation to begin on 1/3 and to see Dr Julien Nordmann 1/3 at 2:15 pm  1/4- didn't get radiation- will make sure scheduled to  go back today  1/5- made sure pt went to radiation -his BP was low- but gave him 500cc IVFs NS bolus to help with BP- BP was 82/50s.   1/7- Increased Midodrine and florienf to help with low BP.   1/14: radiation today/ 20. Dysphagia per pt  12/30- ordered SLP eval and treat  1/3- placed on D3 thin diet  1/4- is  better when sitting up at 90 degrees  1/6- will add Protonix for reflux Sx's.   1/8 MBS performed 1/7--mod oral/mild pharyng dysphagia   -D1, thins recommended by SLP, meds with puree  1/10-  Pt says doesn't help much with D1 thins.   1/12- per SLP, working on D2, but without teeth, cannot do D2 right now until passed trials 21. Hypercalcemia  1/4- is down to 11 from 11.2- will monitor- if goes back up more, will treat. 22. Poor appetite  1/10- will try Marinol 2.5 mg BID  1/12- not working as of yet- placed nutrition consult since not taking supplements - "too sticky".  22. Depression  1/11- on Cymbalta but cannot add Wellbutrin- has allergy- will put small dose of Celexa 10 mg daily on board-so he doesn't have issues with serotonin syndrome.   1/12- think it's the reason pt refused meds this AM, encouraged pt to take his meds for low BP, depression, etc.   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  1/13- want to make sure doesn't have stroke- will check head CT asap. Also called Ophtho- they said call back when confusion better and will examine.  Until then, cannot.  24.  Confusion  1/13- could be marinol, since not receiving pain meds- will stop marinol 25. Severe malnutrition  1/13- has lost 16 lbs in last 3 weeks from 12/21- d/w family about a PEG-  And they agreed- cannot get from IR because hx of hernia repair with mesh- will call gen surgery.  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 Diastolic dysfunction, grade 1: 1/14 ECHO reviewed   LOS: 26 days A FACE TO FACE EVALUATION WAS PERFORMED  Mettie Roylance P Demarus Latterell 02/07/2020, 10:29 AM

## 2020-02-07 NOTE — Progress Notes (Signed)
Pt refused most meds throughout shift despite extensive encouragement. Pt presents increased paranoia. Stated "you are giving me these so you can kill me." pt stated "you are getting ready to cut me." Pt reassured that no one is going to hurt him and meds are given to him to help with his recovery and symptom management. Explain to pt what each med is and what it is given for multiple times. States understanding, but continues to refuse. Pt continues to vividly describe how he is a target of some group of people who are out to kill him.   PO intake is less than 5% for each meal despite multiple encouragement. Pt is "afraid to take anything." pt again reassured that he is safe here and staff is not going to hurt him. Pt will benefit from neuropsych visit.  Pt agreed to take dulcolax suppository at 1850 with a lot of encouragement, and refused all other evening meds.   Gerald Stabs, RN

## 2020-02-08 ENCOUNTER — Encounter: Payer: Self-pay | Admitting: *Deleted

## 2020-02-08 ENCOUNTER — Inpatient Hospital Stay (HOSPITAL_COMMUNITY): Payer: Medicare Other

## 2020-02-08 ENCOUNTER — Ambulatory Visit
Admission: RE | Admit: 2020-02-08 | Discharge: 2020-02-08 | Disposition: A | Discharge status: Home / Self Care | Payer: Medicare Other | Source: Ambulatory Visit | Attending: Radiation Oncology | Admitting: Radiation Oncology

## 2020-02-08 ENCOUNTER — Other Ambulatory Visit: Payer: Medicare Other

## 2020-02-08 ENCOUNTER — Ambulatory Visit: Payer: Medicare Other | Admitting: Physician Assistant

## 2020-02-08 ENCOUNTER — Inpatient Hospital Stay (HOSPITAL_COMMUNITY): Payer: Medicare Other | Admitting: Physical Therapy

## 2020-02-08 DIAGNOSIS — R918 Other nonspecific abnormal finding of lung field: Secondary | ICD-10-CM | POA: Diagnosis not present

## 2020-02-08 LAB — CBC WITH DIFFERENTIAL/PLATELET
Abs Immature Granulocytes: 0.04 10*3/uL (ref 0.00–0.07)
Basophils Absolute: 0 10*3/uL (ref 0.0–0.1)
Basophils Relative: 0 %
Eosinophils Absolute: 0.1 10*3/uL (ref 0.0–0.5)
Eosinophils Relative: 1 %
HCT: 37.2 % — ABNORMAL LOW (ref 39.0–52.0)
Hemoglobin: 11.1 g/dL — ABNORMAL LOW (ref 13.0–17.0)
Immature Granulocytes: 1 %
Lymphocytes Relative: 14 %
Lymphs Abs: 0.8 10*3/uL (ref 0.7–4.0)
MCH: 27.6 pg (ref 26.0–34.0)
MCHC: 29.8 g/dL — ABNORMAL LOW (ref 30.0–36.0)
MCV: 92.5 fL (ref 80.0–100.0)
Monocytes Absolute: 0.6 10*3/uL (ref 0.1–1.0)
Monocytes Relative: 11 %
Neutro Abs: 3.9 10*3/uL (ref 1.7–7.7)
Neutrophils Relative %: 73 %
Platelets: 262 10*3/uL (ref 150–400)
RBC: 4.02 MIL/uL — ABNORMAL LOW (ref 4.22–5.81)
RDW: 12.9 % (ref 11.5–15.5)
WBC: 5.3 10*3/uL (ref 4.0–10.5)
nRBC: 0 % (ref 0.0–0.2)

## 2020-02-08 LAB — BASIC METABOLIC PANEL
Anion gap: 14 (ref 5–15)
BUN: 16 mg/dL (ref 8–23)
CO2: 27 mmol/L (ref 22–32)
Calcium: 10.3 mg/dL (ref 8.9–10.3)
Chloride: 103 mmol/L (ref 98–111)
Creatinine, Ser: 0.8 mg/dL (ref 0.61–1.24)
GFR, Estimated: >60 mL/min (ref >60)
Glucose, Bld: 76 mg/dL (ref 70–99)
Potassium: 3.6 mmol/L (ref 3.5–5.1)
Sodium: 144 mmol/L (ref 135–145)

## 2020-02-08 MED ORDER — CITALOPRAM HYDROBROMIDE 10 MG PO TABS
20.0000 mg | ORAL_TABLET | Freq: Every day | ORAL | Status: DC
  Administered 2020-02-10 – 2020-02-12 (×3): 20 mg via ORAL
  Filled 2020-02-08 (×3): qty 2

## 2020-02-08 MED ORDER — HYDROCODONE-ACETAMINOPHEN 5-325 MG PO TABS
1.0000 | ORAL_TABLET | Freq: Four times a day (QID) | ORAL | Status: DC | PRN
  Administered 2020-02-09 – 2020-02-11 (×4): 1 via ORAL
  Filled 2020-02-08 (×4): qty 1

## 2020-02-08 NOTE — Progress Notes (Signed)
Consult Note  Gregory Bautista 1940-06-19  [id number].    Requesting MD: Reesa Chew PA-C Chief Complaint/Reason for Consult: Consult for feeding tube placement  HPI:  Patient is a 80 year old male who presented to the ED 12/04/19 with 3 months of left chest wall pain, BLE weakness and difficulty walking, and difficulty voiding. 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  Patient was admitted 12/30/19 for thoracic laminectomy with posterior fusion to decompress spinal cord. 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 He was admitted to [facility] 12/21. Patient has been getting radiation therapy and appears oncology would plan palliative chemotherapy but there may be some disposition issues with this. Patient reports some mild abdominal pain and pressure. He reports significant discomfort with swallowing and feels like things get stuck in his esophagus. He denies nausea or vomiting and reports he thinks he has had a BM within the last 2 days.  PMH otherwise significant for a hx of HTN and Hx of glaucoma. Noted to have grade I diastolic dysfunction on ECHO here 1/15. Past abdominal surgery includes open right inguinal hernia repair with mesh 2014, Hx of 2 prior laparotomies for "intestinal parasites" 30+ years ago.   ROS: Review of Systems  Constitutional: Positive for malaise/fatigue and weight loss.  Eyes:       Glaucoma  Respiratory: Negative for shortness of breath and wheezing.   Cardiovascular: Negative for chest pain and palpitations.  Gastrointestinal: Positive for abdominal pain. Negative for constipation, diarrhea, nausea and vomiting.  Genitourinary:       Neurogenic bladder  Musculoskeletal: Positive for back pain.  Neurological: Positive for weakness.  Psychiatric/Behavioral: Positive for depression.  All other systems  reviewed and are negative.   Family History  Problem Relation Age of Onset  . Cancer Mother   . Heart attack Father   . Cancer Brother     Past Medical History:  Diagnosis Date  . Allergic rhinitis    NEC  . Asthma   . Glaucoma   . Lung cancer, main bronchus ([place]) 12/2019  . Tobacco use disorder 09/24/2019    Past Surgical History:  Procedure Laterality Date  . COLON SURGERY    . EYE SURGERY Right   . LAMINECTOMY WITH POSTERIOR LATERAL ARTHRODESIS LEVEL 4 N/A 12/30/2019   Procedure: THORACIC THREE LAMINECTOMY, THORACIC INSTRUMENTATION AND FUSION THORACIC ONE-FIVE;  Surgeon: Newman Pies, MD;  Location: [facility];  Service: Neurosurgery;  Laterality: N/A;    Social History:  reports that he quit smoking about 4 months ago. His smoking use included cigarettes. He started smoking about 64 years ago. He has a 30.00 pack-year smoking history. He has never used smokeless tobacco. He reports that he does not drink alcohol and does not use drugs.  Allergies:  Allergies  Allergen Reactions  . Penicillins Anaphylaxis and Other (See Comments)    Pt does not recall this reaction 2021*Tolerated Cephalexin*  Has patient had a PCN reaction causing immediate rash, facial/tongue/throat swelling, SOB or lightheadedness with hypotension: Yes Has patient had a PCN reaction causing severe rash involving mucus membranes or skin necrosis: No Has patient had a PCN reaction that required hospitalization No Has patient had a PCN reaction occurring within the last 10 years: Yes If all of the above answers are "NO", then may proceed with Cephalosporin use  .  Wellbutrin [Bupropion] Other (See Comments)    Patient disputes this in 2021 Seizures  . Morphine And Related Itching and Other (See Comments)    Patient disputes this in 2021    Medications Prior to Admission  Medication Sig Dispense Refill  . albuterol (VENTOLIN HFA) 108 (90 Base) MCG/ACT inhaler Inhale 2 puffs into the lungs every 6 (six)  hours as needed for wheezing or shortness of breath.    [name] aspirin EC 81 MG tablet Take 81 mg by mouth daily. Swallow whole.    . cholecalciferol (VITAMIN D3) 25 MCG (1000 UNIT) tablet Take 1,000 Units by mouth daily.    . ciprofloxacin (CIPRO) 500 MG tablet Take 1 tablet (500 mg total) by mouth 2 (two) times daily. 10 tablet   . cyclobenzaprine (FLEXERIL) 10 MG tablet Take 1 tablet (10 mg total) by mouth 3 (three) times daily as needed for muscle spasms. 30 tablet 0  . diclofenac Sodium (VOLTAREN) 1 % GEL Apply 1 application topically 4 (four) times daily as needed (pain).     [name] docusate sodium (COLACE) 100 MG capsule Take 1 capsule (100 mg total) by mouth 2 (two) times daily. 10 capsule 0  . gabapentin (NEURONTIN) 100 MG capsule Take 100 mg by mouth 3 (three) times daily.    [name] ipratropium (ATROVENT) 0.06 % nasal spray Place 2 sprays into both nostrils daily as needed for rhinitis.    . Multiple Vitamin (MULTIVITAMIN WITH MINERALS) TABS tablet Take 1 tablet by mouth daily.    [name] oxyCODONE (OXY IR/ROXICODONE) 5 MG immediate release tablet Take 1 tablet (5 mg total) by mouth every 4 (four) hours as needed for moderate pain ((score 4 to 6)). 30 tablet 0  . polyethylene glycol (MIRALAX / GLYCOLAX) 17 g packet Take 17 g by mouth daily. 14 each 0  . senna-docusate (SENOKOT-S) 8.6-50 MG tablet Take 1 tablet by mouth 2 (two) times daily.    . SYMBICORT 160-4.5 MCG/ACT inhaler Inhale 1 puff into the lungs 2 (two) times daily as needed (shortness of breath).     . tamsulosin (FLOMAX) 0.4 MG CAPS capsule Take 1 capsule (0.4 mg total) by mouth daily after breakfast. 30 capsule 0  . timolol (TIMOPTIC) 0.5 % ophthalmic solution Place 1 drop into both eyes 2 (two) times daily.     . Travoprost, BAK Free, (TRAVATAN) 0.004 % SOLN ophthalmic solution Place 1 drop into both eyes at bedtime.       Blood pressure 111/73, pulse (!) 123, temperature 98.2 F (36.8 C), temperature source Oral, resp. rate 18, height 5\' 9"   (1.753 m), weight 61.9 kg, SpO2 100 %. Physical Exam:  General: pleasant, WD, thin male who is sitting up in wheelchair HEENT: frontal lesion about the size of a quarter.  Sclera are noninjected.  PERRL.  Ears and nose without any masses or lesions.  Mouth is pink and moist Heart: regular, rate, and rhythm.  Normal s1,s2. No obvious murmurs, gallops, or rubs noted.  Palpable radial and pedal pulses bilaterally Lungs: CTAB, no wheezes, rhonchi, or rales noted.  Respiratory effort nonlabored Abd: soft, NT, ND, +BS, prior surgical scars (chevron and midline) MS: all 4 extremities are symmetrical with no cyanosis, clubbing, or edema. Skin: warm and dry with no masses, lesions, or rashes Neuro: speech clear and follows commands Psych: A&Ox3 with an appropriate affect.   Results for orders placed or performed during the hospital encounter of 01/12/20 (from the past 48 hour(s))  Basic metabolic panel  Status: None   Collection Time: 02/08/20  4:56 AM  Result Value Ref Range   Sodium 144 135 - 145 mmol/L   Potassium 3.6 3.5 - 5.1 mmol/L   Chloride 103 98 - 111 mmol/L   CO2 27 22 - 32 mmol/L   Glucose, Bld 76 70 - 99 mg/dL    Comment: Glucose reference range applies only to samples taken after fasting for at least 8 hours.   BUN 16 8 - 23 mg/dL   Creatinine, Ser 0.80 0.61 - 1.24 mg/dL   Calcium 10.3 8.9 - 10.3 mg/dL   GFR, Estimated >60 >60 mL/min    Comment: (NOTE) Calculated using the CKD-EPI Creatinine Equation (2021)    Anion gap 14 5 - 15    Comment: Performed at [facility] [address], [place], [place] [address]  CBC with Differential/Platelet     Status: Abnormal   Collection Time: 02/08/20  4:56 AM  Result Value Ref Range   WBC 5.3 4.0 - 10.5 K/uL   RBC 4.02 (L) 4.22 - 5.81 MIL/uL   Hemoglobin 11.1 (L) 13.0 - 17.0 g/dL   HCT 37.2 (L) 39.0 - 52.0 %   MCV 92.5 80.0 - 100.0 fL   MCH 27.6 26.0 - 34.0 pg   MCHC 29.8 (L) 30.0 - 36.0 g/dL   RDW 12.9 11.5 - 15.5 %    Platelets 262 150 - 400 K/uL   nRBC 0.0 0.0 - 0.2 %   Neutrophils Relative % 73 %   Neutro Abs 3.9 1.7 - 7.7 K/uL   Lymphocytes Relative 14 %   Lymphs Abs 0.8 0.7 - 4.0 K/uL   Monocytes Relative 11 %   Monocytes Absolute 0.6 0.1 - 1.0 K/uL   Eosinophils Relative 1 %   Eosinophils Absolute 0.1 0.0 - 0.5 K/uL   Basophils Relative 0 %   Basophils Absolute 0.0 0.0 - 0.1 K/uL   Immature Granulocytes 1 %   Abs Immature Granulocytes 0.04 0.00 - 0.07 K/uL    Comment: Performed at [facility] [address], [place], [place] [address]   No results found.    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 Depression Confusion   Dysphagia and poor PO intake - SLP following, patient reports feeling choked on food and has had poor PO intake Severe malnutrition - check prealbumin with AM labs tomorrow  Consult for open gastrostomy placement - Patient has had several prior abdominal surgeries - after review of imaging - possible window to attempt PEG, if unable and feeding access still desired would need open gastrostomy  - risk of enterotomy with open procedure if needed, given multiple previous abdominal surgeries is definitely a concern - discussed with patient as well as patient's brother and daughter - will plan for attempt at PEG placement in endoscopy tomorrow    FEN: DYS1 diet, intermittently refusing some supplements VTE: SQ heparin  ID: no current abx  Norm Parcel, [facility] 02/08/2020, 12:24 PM Please see Amion for pager number during day hours 7:00am-4:30pm

## 2020-02-08 NOTE — Progress Notes (Signed)
Occupational Therapy Session Note  Patient Details  Name: Gregory Bautista MRN: [id number] Date of Birth: 1940/04/30  Today's Date: 02/08/2020 OT Individual Time: 0700-0755 OT Individual Time Calculation (min): 55 min    Short Term Goals: Week 4:  OT Short Term Goal 1 (Week 4): Pt will don pants with min A sit<>stand from w/c or EOB OT Short Term Goal 2 (Week 4): Pt will perform toilet transfers with CGA using RW OT Short Term Goal 3 (Week 4): Pt will tolerate standing for 3 mins during functional tasks/ADLs with CGA  Skilled Therapeutic Interventions/Progress Updates:    Pt resting in bed upon arrival and agreeable to getting OOB to prepare for breakfast when it arrives. Dependent for donning Ted Hose and BLE ace wraps. Pt required mod A for donning socks and pants seated EOB. Supine>sit EOB with supervision. Sit<>stand with min A and min A for stand step transfer to w/c. Pt with no reports of dizziness this morning.Sit<>stand X 4 from w/c with min A fading to CGA. Pt continues to use suction for management of secretions. Abdominal binder donned in sitting in w/c. Pt requires more then a reasonable amount of time to initiate tasks and respond to commands. Pt remained seated in w/c with breakfast tray on table in front.   Therapy Documentation Precautions:  Precautions Precautions: Back,Fall Precaution Comments: reviewed precautions Restrictions Weight Bearing Restrictions: No Pain:  Pt denies pain this morning   Therapy/Group: Individual Therapy  Leroy Libman 02/08/2020, 7:58 AM

## 2020-02-08 NOTE — Progress Notes (Signed)
Physical Therapy Session Note  Patient Details  Name: [name] MRN: [id number] Date of Birth: February 08, 1940  Today's Date: 02/08/2020 PT Individual Time: 0915-1000 PT Individual Time Calculation (min): 45 min   Short Term Goals: Week 4:  PT Short Term Goal 1 (Week 4): =LTG due to ELOS  Skilled Therapeutic Interventions/Progress Updates:    Pt received seated in bed, agreeable to PT session. Pt reports ongoing pain in L shoulder and axillary region, not rated. Nursing able to provide pain medication at beginning of session. Semi-reclined in bed to sitting EOB via logroll with Supervision. Sit to stand and stand pivot transfer to w/c with RW and min A. Seated BP 123/80 with thigh high TEDs, ACE wrap, and abdominal binder in place. Seated SpO2 100% on 2L O2 via [place], HR 127 bpm. Nursing aware of elevated HR this session. Manual w/c propulsion x 125 ft with use of BUE at Supervision level for global endurance training. Sit to stand with min A to RW. Attempt ambulation but pt reports onset of dizziness in standing that worsens as he is standing, returned to sitting. Unable to obtain standing BP reading due to dizziness. Pt left seated in w/c in room with needs in reach, quick release belt and chair alarm in place at end of session. Encouraged pt to spend more time OOB sitting up for improved upright tolerance and BP management, pt agreeable.  Therapy Documentation Precautions:  Precautions Precautions: Back,Fall Precaution Comments: reviewed precautions Restrictions Weight Bearing Restrictions: No   Therapy/Group: Individual Therapy   Excell Seltzer, PT, DPT  02/08/2020, 11:47 AM

## 2020-02-08 NOTE — Progress Notes (Signed)
Patient ID: Gregory Bautista, male   DOB: 10/27/1940, 80 y.o.   MRN: [id number]  SW waiting on updates from [facility] on if admissions team is willing to extend bed to patient. SW sent email to pt dtr Gregory Bautista to get updates on SNF locations of interest in the event [facility] does not have any beds.   [name], MSW, [facility] Office: [phone] Cell: [phone] Fax: [phone]

## 2020-02-08 NOTE — Progress Notes (Signed)
Patient missed his new patient appt due to being in rehab.  I notified scheduling to call and re-schedule him to see Casie on 02/15/20 with labs.

## 2020-02-08 NOTE — Progress Notes (Signed)
Patient having pain today.  RN gave pain medication by crushing in apple sauce.  Patient did eat a little bit today with encouragement to do so.  Spoke with patient's daughter on getting the Core Trak tomorrow, the patient was questioning if he will have to be sedated.  I informed the daughter that, no he will not be sedated and it is done in his room.  I also spoke with patient giving him the same information.  Patient did respond with, " I understand."  Patient did refuse his medication today and stated,  " I can't swallow so I can't take my medication."  RN educated patient on taking his eye drops Timolol.  Patient stated, " I don't know if those are the right drops."  RN informed patient that he has glaucoma and these drops are the treatment for it.  Patient did voice understanding and agreed.  Timolol drops could not be found in patients room.  RN requested more drops from pharmacy.

## 2020-02-08 NOTE — Progress Notes (Signed)
Occupational Therapy Session Note  Patient Details  Name: Kern Gingras MRN: [id number] Date of Birth: 07/04/1940  Today's Date: 02/08/2020 OT Individual Time: 1130-1153 OT Individual Time Calculation (min): 23 min    Short Term Goals: Week 4:  OT Short Term Goal 1 (Week 4): Pt will don pants with min A sit<>stand from w/c or EOB OT Short Term Goal 2 (Week 4): Pt will perform toilet transfers with CGA using RW OT Short Term Goal 3 (Week 4): Pt will tolerate standing for 3 mins during functional tasks/ADLs with CGA  Skilled Therapeutic Interventions/Progress Updates:    Pt seated in w/c upon arrival. Pt agreeable to sit<>stand with RW. Pt initially with no s/s of orthostatic BP. Sit<>stand x 3 with report of dizziness with each stand. Pt encouraged to remain seated in w/c to eat lunch. Pt agreeable. All needs within reach.  Therapy Documentation Precautions:  Precautions Precautions: Back,Fall Precaution Comments: reviewed precautions Restrictions Weight Bearing Restrictions: No Pain: Pain Assessment Pain Scale: 0-10 Pain Score: 5  Faces Pain Scale: Hurts a little bit Pain Type: Chronic pain Pain Location: Generalized Pain Descriptors / Indicators: Aching Pain Frequency: Constant Pain Onset: On-going Patients Stated Pain Goal: 2 Pain Intervention(s): meds admin prior to therapy   Therapy/Group: Individual Therapy  Leroy Libman 02/08/2020, 12:14 PM

## 2020-02-08 NOTE — Progress Notes (Signed)
Patient ID: Gregory Bautista, male   DOB: Nov 03, 1940, 80 y.o.   MRN: [id number] Plan PEG tube placement in Endo tomorrow with anesthesia. I discussed the procedure, risks, and benefits with him and he agrees.  Georganna Skeans, MD, MPH, FACS Please use [email or web address] to contact on call provider

## 2020-02-08 NOTE — Progress Notes (Signed)
[place] PHYSICAL MEDICINE & REHABILITATION PROGRESS NOTE   Subjective/Complaints: Pt reports pain as 7/10- aching all over.   When tries to eat, gets pressure in his chest and feels like food coming back up- "going "the other way".   Wants to try and take pain meds for pain.  Spoke with family- will try and get him to do Cortrak- so will stop Flomax and Cymbalta since cannot be crushed- and change to Celexa 20 mg daily.     ROS:  Limited by cognition/behavior  Objective:   No results found. Recent Labs    02/08/20 0456  WBC 5.3  HGB 11.1*  HCT 37.2*  PLT 262   Recent Labs    02/08/20 0456  NA 144  K 3.6  CL 103  CO2 27  GLUCOSE 76  BUN 16  CREATININE 0.80  CALCIUM 10.3    Intake/Output Summary (Last 24 hours) at 02/08/2020 1119 Last data filed at 02/08/2020 0400 Gross per 24 hour  Intake 30 ml  Output 500 ml  Net -470 ml    Physical Exam: Vital Signs Blood pressure 117/75, pulse 96, temperature 98.4 F (36.9 C), temperature source Oral, resp. rate 18, height 5\' 9"  (1.753 m), weight 61.9 kg, SpO2 96 %. Gen: awake, sitting up in bedside chair, wearing O2 2L by [place], c/o pain 7/10 HEENT: oral mucosa pink and moist, NCAT Cardio: RRR today Chest: CTA B/L- no W/R/R- good air movement Abd: Soft, NT, ND, (+)BS- hypoactive Ext: no clubbing, cyanosis, or edema- very tight in L>R upper traps- also bones more prominent -losing weight Psych: got upset/aggravated with nursing as I left room about taking meds- is refusing still Skin: Warm and dry.  Intact. Musc: No edema in extremities.  No tenderness in extremities. Neuro: confused again this AM- MRI brain (-)  Motor: Bilateral upper extremities: Grossly 5/5 proximal distal Bilateral lower extremities: Grossly 4+/5 proximal to distal, stable  Assessment/Plan: 1. Functional deficits which require 3+ hours per day of interdisciplinary therapy in a comprehensive inpatient rehab setting.  Physiatrist is providing  close team supervision and 24 hour management of active medical problems listed below.  Physiatrist and rehab team continue to assess barriers to discharge/monitor patient progress toward functional and medical goals  Care Tool:  Bathing    Body parts bathed by patient: Right arm,Left arm,Chest,Abdomen,Right upper leg,Left upper leg,Face,Front perineal area   Body parts bathed by helper: Buttocks,Right lower leg,Left lower leg Body parts n/a: Front perineal area,Buttocks,Right lower leg,Left lower leg   Bathing assist Assist Level: Minimal Assistance - Patient > 75%     Upper Body Dressing/Undressing Upper body dressing   What is the patient wearing?: Pull over shirt    Upper body assist Assist Level: Set up assist    Lower Body Dressing/Undressing Lower body dressing      What is the patient wearing?: Incontinence brief,Pants     Lower body assist Assist for lower body dressing: Moderate Assistance - Patient 50 - 74%     Toileting Toileting    Toileting assist Assist for toileting: Moderate Assistance - Patient 50 - 74% Assistive Device Comment: BSC   Transfers Chair/bed transfer  Transfers assist     Chair/bed transfer assist level: Contact Guard/Touching assist Chair/bed transfer assistive device: Programmer, multimedia   Ambulation assist      Assist level: Contact Guard/Touching assist Assistive device: Walker-rolling Max distance: 55   Walk 10 feet activity   Assist     Assist level:  Contact Guard/Touching assist Assistive device: Walker-rolling   Walk 50 feet activity   Assist Walk 50 feet with 2 turns activity did not occur: Safety/medical concerns  Assist level: Minimal Assistance - Patient > 75% Assistive device: Walker-rolling    Walk 150 feet activity   Assist Walk 150 feet activity did not occur: Safety/medical concerns         Walk 10 feet on uneven surface  activity   Assist Walk 10 feet on uneven  surfaces activity did not occur: Safety/medical concerns         Wheelchair     Assist Will patient use wheelchair at discharge?: Yes Type of Wheelchair: Manual    Wheelchair assist level: Supervision/Verbal cueing Max wheelchair distance: 50    Wheelchair 50 feet with 2 turns activity    Assist        Assist Level: Supervision/Verbal cueing   Wheelchair 150 feet activity     Assist      Assist Level: Moderate Assistance - Patient 50 - 74%   Blood pressure 117/75, pulse 96, temperature 98.4 F (36.9 C), temperature source Oral, resp. rate 18, height 5\' 9"  (1.753 m), weight 61.9 kg, SpO2 96 %.  Medical Problem List and Plan: 1.  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  1/7- spoke to brother yesterday who said he'd pass on to daughter- sounds like he didn't- palliative care to see pt  1/10- palliative saw pt- is DNAR/DNI now  1/11- called Oncology on call- trying to figure out plan for treatment- was told 12/27 that would do radiation and then immediately do Chemo, however, due to dispo issues (family cannot take home) needs to go to SNF after radiation- spoke at length to Dr Burr Medico- she or Dr Julien Nordmann will see pt today/tomorrow.   1/12- Cannot do PET scan until d/c from hospital. Dr Tommy Medal to see pt Friday  Continue CIR  2.  Antithrombotics: -DVT/anticoagulation:   Continue Heparin.  Mechanical: Sequential compression devices, below knee Bilateral lower extremities   Dopplers negative for DVT  -antiplatelet therapy: N/A 3. Pain Management:    Decreased oxycodone to q4H prn.  DC gabapentin since causing constipation  Added Cymbalta 30 mg daily.   1/3- will increase  Duloxetine to 60 mg nightly- for nerve pain  1/5- not taking pain meds often- encouraged pt to take pain meds before therapy.   1/6- will reduce oxy to Norco q4 hours prn due to increased confusion per family (nursing hasn't seen it)  with pain meds  1/9 pt told me his pain is under control presently  1/10- will place lidoderm patches on shoulders/upper traps B/L where muscles REAL tight.   1/11- can't have lidoderm in >3 patches, but refused yesterday- will check into this.  1/12- saw after left room, pt refused ALL meds this AM-   1/13- no pain meds in 2-4 days- tramadol or Norco 1/15: d/c Norco since patient is not requiring  1/17- will d/c Cymbalta since won't be crushed- also restarted norco for when Tramadol not enough-  4. Mood: LCSW to follow for evaluation and support.              -antipsychotic agents: N/a  Discussed with nursing, he has been sleeping well.  5. Neuropsych: This patient is capable of making decisions on his own behalf.  Appreciate neuropsych 6. Skin/Wound Care: Routine pressure relief measures.  7. Fluids/Electrolytes/Nutrition: Monitor I/Os.             Added juven to promote healing.  8. Neurogenic bladder: Foley in place due to urinary retention/UTI, with plans to remove on Monday  Flomax daily initiated on 12/30, monitor blood pressure closely  Florinef 0.1 mg daily initiated to assist with low BP.   Improving on 1/2  1/3- will d/c foley this afternoon, after radiation  1/4- wasn't removed- will remove today- this AM and ordered I/o caths and PVRs since might not void.  1/5- so far, no voiding- cathing required 100%   1/6-9- no voiding- caths volumes 300-600c q6 hours  1/10- no voiding- in/out caths being done- pt doesn't want foley back.  1/11- no voiding still  1/12- no voiding- doing I/o caths- nursing doing- will need foley when goes to SNF   1/17- wait on placing foley for now- d/w family- but stop Flomax since cannot be crushed 9.  Acute renal  failure: Resolved  Creatinine 0.86 on 12/27, 0.78 on 1/14, repeat weekly.  10. Acute blood loss anemia:   Hemoglobin 12.4 on 12/28, 10.5 on 1/14, repeat weekly.   Continue to monitor 11. Metastatic Lung AdenoCA:  On dulera--has been on supplemental oxygen  1/4- didn't get radiation yesterday- is scheduled ot go back today.  1/6- Getting radiation daily M-F  1/13- Dr Julien Nordmann to see Friday- cannot get PET scan since inpt- explained to family.   12. Kleb/Proteus UTI: Completed course of Cipro 13. 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    1/12- will give enema tonight per request of nursing  1/15: had large BM yesterday- continue bowel program  1/16: Had BM yesterday 14. Hypotension/tachycardia-   Severe orthostasis, negative on 12/31  Relatively controlled on 1/2  TEDs/abdominal binder for support.   1/5- added midodrine 5 mg TID-AC for low Bps  1/7- increased Midodrine to 10 mg TID- will also increase Florinef to help with orthostatic hypotension. It's really bad when goes to radiation  1/9 bp more robust with recent changes  1/12- doing better today per OT, but then refused all AM meds-   1/13- tachycardia to 120s- will check EKG and labs to see if dehydrated-Last BUN 25- will give IVFs D5NS 75cc/hour x 1 days and monitor  1/15: BP better controlled  1/17- Pt refusing all meds including florinef and Midodrine- will back down doses slightly since hasn't taken in days- when restarts with Cortrak  15.  Sleep disturbance: Scheduled melatonin at nights.   Trazodone 100  1/3- sleeping well- con't regimen 17. Tenting/mild  dehydration- will push 6 cups/water daily minimum  1/6- skin very dry- order eucerin AND push PO fluids 18. Hyperkalemia  Lokelma ordered on 12/23  Potassium 4.6 on 12/27 19. Pancoast tumor/ metastatic lung cancer:   Radiation to begin on 1/3 and to see Dr Julien Nordmann 1/3 at 2:15 pm  1/4-  didn't get radiation- will make sure scheduled to  go back today  1/5- made sure pt went to radiation -his BP was low- but gave him 500cc IVFs NS bolus to help with BP- BP was 82/50s.   1/7- Increased Midodrine and florienf to help with low BP.   1/14: radiation today/ 20. Dysphagia per pt  12/30- ordered SLP eval and treat  1/3- placed on D3 thin diet  1/4- is better when sitting up at 90 degrees  1/6- will add Protonix for reflux Sx's.   1/8 MBS performed 1/7--mod oral/mild pharyng dysphagia   -D1, thins recommended by SLP, meds with puree  1/10-  Pt says doesn't help much with D1 thins.   1/12- per SLP, working on D2, but without teeth, cannot do D2 right now until passed trials  1/17- will place Cortrak and try to get PEG ASAP via general surgery- d/w family both and know if we don't do, he won't make it more than another few weeks.  21. Hypercalcemia  1/4- is down to 11 from 11.2- will monitor- if goes back up more, will treat. 22. Poor appetite  1/10- will try Marinol 2.5 mg BID  1/12- not working as of yet- placed nutrition consult since not taking supplements - "too sticky"  1/17- stopped due to increased confusion.  22. Depression  1/11- on Cymbalta but cannot add Wellbutrin- has allergy- will put small dose of Celexa 10 mg daily on board-so he doesn't have issues with serotonin syndrome.   1/12- think it's the reason pt refused meds this AM, encouraged pt to take his meds for low BP, depression, et  1/17- changed cymbalta to celex 20 mg since can be crushed.   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  1/13- want to make sure doesn't have stroke- will check head CT asap. Also called Ophtho- they said call back  when confusion better and will examine.  Until then, cannot.   1/17- head CT/ looks OK- no stroke- found out was refusing glaucoma meds 24. Confusion  1/13- could be marinol, since not receiving pain meds- will stop marinol 25. Severe malnutrition  1/13- has lost 16 lbs in last 3 weeks from 12/21- d/w family about a PEG-  And they agreed- cannot get from IR because hx of hernia repair with mesh- will call gen surgery.   1/17- getting PEG as soon as possible 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 Diastolic dysfunction, grade 1: 1/14 ECHO reviewed   I spoke to family  For 30 minutes today in addition to seeing pt and calling gen surgery/radiology. I spoke about renal mass- think it's a cyst, but waiting MRI abdomen results; need Cortrak- they agreed and will call him to remind him he needs it, and trying to arrange for PEG. Also discussed MRI brain and that was refusing glaucoma drops as well-   >50% of time spent in coordination of care.    LOS: 27 days A FACE TO FACE EVALUATION WAS PERFORMED  Evalynne Locurto 02/08/2020, 11:19 AM

## 2020-02-08 NOTE — Progress Notes (Signed)
Patient having 7/10 pain on the pain scale.  Patient agreed to try taking a Tramadol for pain.  RN crushed Tramadol and gave with a small amount of apple sauce.  Patient did have a hard time swallowing but was able to get 3 bites of apple sauce down.  Patient did refuse all other medication due to swallowing problems.

## 2020-02-08 NOTE — Progress Notes (Signed)
   Percutaneous G tube request  CT and UGI reveal anatomy NOT  safe for percutaneous approach per Dr Rolla Plate.  Consider Surgical Consult  Algis Liming [facility] aware and agreeable

## 2020-02-09 ENCOUNTER — Encounter (HOSPITAL_COMMUNITY)
Admission: RE | Disposition: A | Discharge status: Skilled Nursing Facility | Payer: Self-pay | Source: Intra-hospital | Attending: Physical Medicine and Rehabilitation

## 2020-02-09 ENCOUNTER — Ambulatory Visit
Admission: RE | Admit: 2020-02-09 | Discharge: 2020-02-09 | Disposition: A | Discharge status: Home / Self Care | Payer: Medicare Other | Source: Ambulatory Visit | Attending: Radiation Oncology | Admitting: Radiation Oncology

## 2020-02-09 ENCOUNTER — Inpatient Hospital Stay (HOSPITAL_COMMUNITY): Payer: Medicare Other | Admitting: Anesthesiology

## 2020-02-09 ENCOUNTER — Inpatient Hospital Stay (HOSPITAL_COMMUNITY): Payer: Medicare Other

## 2020-02-09 ENCOUNTER — Other Ambulatory Visit: Payer: Self-pay

## 2020-02-09 ENCOUNTER — Inpatient Hospital Stay (HOSPITAL_COMMUNITY): Payer: Medicare Other | Admitting: Physical Therapy

## 2020-02-09 ENCOUNTER — Encounter: Payer: Self-pay | Admitting: *Deleted

## 2020-02-09 DIAGNOSIS — R918 Other nonspecific abnormal finding of lung field: Secondary | ICD-10-CM | POA: Diagnosis not present

## 2020-02-09 HISTORY — PX: ESOPHAGOGASTRODUODENOSCOPY (EGD) WITH PROPOFOL: SHX5813

## 2020-02-09 HISTORY — PX: PEG PLACEMENT: SHX5437

## 2020-02-09 LAB — CBC
HCT: 35.4 % — ABNORMAL LOW (ref 39.0–52.0)
Hemoglobin: 11.2 g/dL — ABNORMAL LOW (ref 13.0–17.0)
MCH: 28.6 pg (ref 26.0–34.0)
MCHC: 31.6 g/dL (ref 30.0–36.0)
MCV: 90.5 fL (ref 80.0–100.0)
Platelets: 242 10*3/uL (ref 150–400)
RBC: 3.91 MIL/uL — ABNORMAL LOW (ref 4.22–5.81)
RDW: 13 % (ref 11.5–15.5)
WBC: 5.5 10*3/uL (ref 4.0–10.5)
nRBC: 0 % (ref 0.0–0.2)

## 2020-02-09 LAB — BASIC METABOLIC PANEL
Anion gap: 11 (ref 5–15)
BUN: 16 mg/dL (ref 8–23)
CO2: 28 mmol/L (ref 22–32)
Calcium: 10.2 mg/dL (ref 8.9–10.3)
Chloride: 105 mmol/L (ref 98–111)
Creatinine, Ser: 0.73 mg/dL (ref 0.61–1.24)
GFR, Estimated: >60 mL/min (ref >60)
Glucose, Bld: 98 mg/dL (ref 70–99)
Potassium: 3.8 mmol/L (ref 3.5–5.1)
Sodium: 144 mmol/L (ref 135–145)

## 2020-02-09 LAB — PREALBUMIN: Prealbumin: 5.6 mg/dL — ABNORMAL LOW (ref 18–38)

## 2020-02-09 SURGERY — INSERTION, PEG TUBE
Anesthesia: Monitor Anesthesia Care

## 2020-02-09 MED ORDER — PROPOFOL 10 MG/ML IV BOLUS
INTRAVENOUS | Status: DC | PRN
  Administered 2020-02-09: 30 mg via INTRAVENOUS

## 2020-02-09 MED ORDER — ADULT MULTIVITAMIN W/MINERALS CH
1.0000 | ORAL_TABLET | Freq: Every day | ORAL | Status: DC
  Administered 2020-02-10 – 2020-02-15 (×6): 1
  Filled 2020-02-09 (×6): qty 1

## 2020-02-09 MED ORDER — FLUDROCORTISONE ACETATE 0.1 MG PO TABS
0.1000 mg | ORAL_TABLET | Freq: Every day | ORAL | Status: DC
  Administered 2020-02-10 – 2020-02-15 (×6): 0.1 mg
  Filled 2020-02-09 (×7): qty 1

## 2020-02-09 MED ORDER — OSMOLITE 1.5 CAL PO LIQD
1000.0000 mL | ORAL | Status: DC
  Administered 2020-02-09 – 2020-02-15 (×6): 1000 mL
  Filled 2020-02-09 (×10): qty 1000

## 2020-02-09 MED ORDER — MIDODRINE HCL 5 MG PO TABS
5.0000 mg | ORAL_TABLET | Freq: Three times a day (TID) | ORAL | Status: DC
  Administered 2020-02-09 – 2020-02-12 (×7): 5 mg via ORAL
  Filled 2020-02-09 (×7): qty 1

## 2020-02-09 MED ORDER — PROPOFOL 500 MG/50ML IV EMUL
INTRAVENOUS | Status: DC | PRN
  Administered 2020-02-09: 50 ug/kg/min via INTRAVENOUS

## 2020-02-09 MED ORDER — PROSOURCE TF PO LIQD
45.0000 mL | Freq: Two times a day (BID) | ORAL | Status: DC
  Administered 2020-02-09 – 2020-02-15 (×12): 45 mL
  Filled 2020-02-09 (×13): qty 45

## 2020-02-09 MED ORDER — MIDODRINE HCL 5 MG PO TABS: 5.0000 mg | ORAL_TABLET | Freq: Three times a day (TID) | ORAL | Status: DC

## 2020-02-09 MED ORDER — GLYCOPYRROLATE 0.2 MG/ML IJ SOLN
INTRAMUSCULAR | Status: DC | PRN
  Administered 2020-02-09: .2 mg via INTRAVENOUS

## 2020-02-09 MED ORDER — LIDOCAINE 2% (20 MG/ML) 5 ML SYRINGE
INTRAMUSCULAR | Status: DC | PRN
  Administered 2020-02-09: 40 mg via INTRAVENOUS

## 2020-02-09 MED ORDER — HEPARIN SODIUM (PORCINE) 5000 UNIT/ML IJ SOLN
INTRAMUSCULAR | Status: AC
  Filled 2020-02-09: qty 1

## 2020-02-09 MED ORDER — LACTATED RINGERS IV SOLN: INTRAVENOUS | Status: DC | PRN

## 2020-02-09 MED ORDER — PROSOURCE TF PO LIQD: 45.0000 mL | Freq: Every day | ORAL | Status: DC

## 2020-02-09 NOTE — Anesthesia Procedure Notes (Signed)
Procedure Name: MAC Date/Time: 02/09/2020 12:05 PM Performed by: Lowella Dell, CRNA Pre-anesthesia Checklist: Patient identified, Emergency Drugs available, Suction available, Patient being monitored and Timeout performed Patient Re-evaluated:Patient Re-evaluated prior to induction Oxygen Delivery Method: Nasal cannula Placement Confirmation: positive ETCO2 Dental Injury: Teeth and Oropharynx as per pre-operative assessment

## 2020-02-09 NOTE — Op Note (Signed)
Bautista Gregory Patient Name: Gregory Bautista Procedure Date : 02/09/2020 MRN: [id number] Attending MD: Georganna Skeans , MD Date of Birth: 1940-06-11 CSN: [id number] Age: 80 Admit Type: Inpatient Procedure:                Upper GI endoscopy Indications:              Place PEG due to dysphagia Providers:                Georganna Skeans, MD, Barkley Boards, PA-C, Nelia Shi, RN, Kary Kos RN, RN, Ladona Ridgel,                            Technician, Cira Servant, CRNA Referring MD:              Medicines:                See the Anesthesia note for documentation of the                            administered medications Complications:            No immediate complications. Estimated Blood Loss:     Estimated blood loss was minimal. Procedure:                Pre-Anesthesia Assessment:                           - Prior to the procedure, a History and Physical                            was performed, and patient medications and                            allergies were reviewed. The patient is unable to                            give consent secondary to the patient's altered                            mental status. The risks and benefits of the                            procedure and the sedation options and risks were                            discussed with the patient's brother. All questions                            were answered and informed consent was obtained.                            Patient identification and proposed procedure were  verified by the physician, the nurse, the                            anesthetist and the technician in the endoscopy                            suite. Mental Status Examination: alert but                            confused. ASA Grade Assessment: III - A patient                            with severe systemic disease. After reviewing the                            risks  and benefits, the patient was deemed in                            satisfactory condition to undergo the procedure.                            The anesthesia plan was to use monitored anesthesia                            care (MAC). Immediately prior to administration of                            medications, the patient was re-assessed for                            adequacy to receive sedatives. The heart rate,                            respiratory rate, oxygen saturations, blood                            pressure, adequacy of pulmonary ventilation, and                            response to care were monitored throughout the                            procedure. The physical status of the patient was                            re-assessed after the procedure.                           After obtaining informed consent, the endoscope was                            passed under direct vision. Throughout the  procedure, the patient's blood pressure, pulse, and                            oxygen saturations were monitored continuously. The                            GIF-H190 ([id number]) Olympus gastroscope was                            introduced through the mouth, and advanced to the                            duodenal bulb. The upper GI endoscopy was somewhat                            difficult due to post-surgical anatomy. The patient                            tolerated the procedure well. Scope In: Scope Out: Findings:      No gross lesions were noted in the esophagus.      No gross lesions were noted in the stomach. Placement of an externally       removable PEG with no T-fasteners was successfully completed. The       external bumper was at the 2.5 cm marking on the tube. Impression:               - No gross lesions in esophagus.                           - No gross lesions in the stomach.                           - An externally removable PEG placement  was                            successfully completed.                           - No specimens collected. Recommendation:           meds now, tube feeds in 4h Procedure Code(s):        --- Professional ---                           [phone], Esophagogastroduodenoscopy, flexible,                            transoral; with directed placement of percutaneous                            gastrostomy tube Diagnosis Code(s):        --- Professional ---                           R13.10, Dysphagia, unspecified  Z43.1, Encounter for attention to gastrostomy CPT copyright 2019 [employer]. All rights reserved. The codes documented in this report are preliminary and upon coder review may  be revised to meet current compliance requirements. Georganna Skeans, MD 02/09/2020 12:48:30 PM This report has been signed electronically. Number of Addenda: 0

## 2020-02-09 NOTE — Progress Notes (Signed)
VAST consulted to obtain IV access. Arrived at pt's room to find patient not in room. Reportedly patient has gone for a procedure.

## 2020-02-09 NOTE — Progress Notes (Signed)
Physical Therapy Weekly Progress Note  Patient Details  Name: Gregory Bautista MRN: [id number] Date of Birth: 06-03-1940  Beginning of progress report period: February 03, 2020 End of progress report period: February 09, 2020  Today's Date: 02/09/2020  Patient has met 0 of 1 short term goals. STG were set to LTG due to ELOS. Pt's LOS continues to be extended beyond originally scheduled date due to slow progress towards therapy goals and lack of family support upon d/c home. Pt to stay on CIR until completion of radiation treatments then plan to d/c to SNF. Pt continues to make very slow progress towards therapy goals and remains limited by decreased endurance, decreased insight into his deficits, and being internally distracted at times by his overall situation. Pt currently requires min A overall for bed mobility, transfers with RW, and short distance gait with RW. Pt is able to perform w/c mobility at Supervision level. Pt's standing and gait have been limited by ongoing orthostatic hypotension limiting ability to functionally participate in standing activities.  Patient continues to demonstrate the following deficits muscle weakness, decreased cardiorespiratoy endurance and decreased oxygen support, decreased coordination, decreased initiation, decreased attention, decreased awareness, decreased problem solving, decreased safety awareness, decreased memory and delayed processing and decreased sitting balance, decreased standing balance, decreased postural control and decreased balance strategies and therefore will continue to benefit from skilled PT intervention to increase functional independence with mobility.  Patient not progressing toward long term goals.  See goal revision..  Plan of care revisions: downgraded gait distance goals due to slow progress.  PT Short Term Goals Week 4:  PT Short Term Goal 1 (Week 4): =LTG due to ELOS PT Short Term Goal 1 - Progress (Week 4): Progressing toward  goal Week 5:  PT Short Term Goal 1 (Week 5): =LTG due to ELOS   Therapy Documentation Precautions:  Precautions Precautions: Back,Fall Precaution Comments: reviewed precautions Restrictions Weight Bearing Restrictions: No   Therapy/Group: Individual Therapy   Excell Seltzer, PT, DPT  02/09/2020, 3:16 PM

## 2020-02-09 NOTE — Progress Notes (Signed)
Occupational Therapy Weekly Progress Note ° °Patient Details  °Name: Gregory Bautista °MRN: [id number] °Date of Birth: 08/31/1940 ° °Beginning of progress report period: February 03, 2020 °End of progress report period: February 09, 2020 ° ° °Patient has met 0 of 3 short term goals.  Pt progress and participation in therapy has been inconsistent during the past week. Pt requires min A/supervisoin for bed mobility, min/mod A for LB dressing, min A for sit<>stand and functional transfers. Pt requires max encouragement to actively participate and max verbal cues to initiate self care tasks. Pt has been eating approx 5% of meals and has lost 16 lbs over past three weeks. Pt scheduled for PEG tube.  ° °Patient continues to demonstrate the following deficits: muscle weakness, decreased cardiorespiratoy endurance and decreased oxygen support, decreased initiation, decreased problem solving, decreased safety awareness and delayed processing and decreased standing balance and decreased balance strategies and therefore will continue to benefit from skilled OT intervention to enhance overall performance with BADL. ° °Patient progressing toward long term goals..  Continue plan of care. ° °OT Short Term Goals °Week 4:  OT Short Term Goal 1 (Week 4): Pt will don pants with min A sit<>stand from w/c or EOB °OT Short Term Goal 2 (Week 4): Pt will perform toilet transfers with CGA using RW °OT Short Term Goal 3 (Week 4): Pt will tolerate standing for 3 mins during functional tasks/ADLs with CGA °Week 5:  OT Short Term Goal 1 (Week 5): Pt will don pants with min A sit<>stand from w/c or EOB °OT Short Term Goal 2 (Week 5): Pt will perform toilet transfers with CGA using RW °OT Short Term Goal 3 (Week 5): Pt will tolerate standing for 3 mins during functional tasks/ADLs with CGA ° ° °Lanier, [name] °02/09/2020, 3:12 PM  °

## 2020-02-09 NOTE — Transfer of Care (Signed)
Immediate Anesthesia Transfer of Care Note  Patient: Gregory Bautista  Procedure(s) Performed: PERCUTANEOUS ENDOSCOPIC GASTROSTOMY (PEG) PLACEMENT (N/A )  Patient Location: PACU and Endoscopy Unit  Anesthesia Type:MAC  Level of Consciousness: awake and patient cooperative  Airway & Oxygen Therapy: Patient Spontanous Breathing and Patient connected to nasal cannula oxygen  Post-op Assessment: Report given to RN and Post -op Vital signs reviewed and stable  Post vital signs: Reviewed and stable  Last Vitals:  Vitals Value Taken Time  BP 130/71 02/09/20 1240  Temp    Pulse    Resp 15 02/09/20 1242  SpO2    Vitals shown include unvalidated device data.  Last Pain:  Vitals:   02/09/20 1240  TempSrc: (P) Oral  PainSc:       Patients Stated Pain Goal: 2 (70/96/43 8381)  Complications: No complications documented.

## 2020-02-09 NOTE — Patient Care Conference (Signed)
Inpatient RehabilitationTeam Conference and Plan of Care Update Date: 02/09/2020   Time: 11:11 AM    Patient Name: Gregory Bautista      Medical Record Number: [id number]  Date of Birth: [birth year] Sex: Male         Room/Bed: 4M01C/4M01C-01 Payor Info: Payor: [employer] MEDICARE / Plan: [facility] MEDICARE / Product Type: *No Product type* /    Admit Date/Time:  01/12/2020  3:14 PM  Primary Diagnosis:  Thoracic myelopathy  Hospital Problems: Principal Problem:   Thoracic myelopathy Active Problems:   Hyperkalemia   Neurogenic bladder   Sleep disturbance   Postoperative pain   Drug-induced constipation   Neurogenic bowel   Protein-calorie malnutrition, severe    Expected Discharge Date: Expected Discharge Date: 02/15/20  Team Members Present: Physician leading conference: Dr. Courtney Heys Care Coodinator Present: [name], LCSWA;Zak Gondek Creig Hines, RN, BSN, CRRN Nurse Present: Mohammed Kindle, RN PT Present: Excell Seltzer, PT OT Present: Roanna Epley, [place], OT SLP Present: Charolett Bumpers, SLP PPS Coordinator present : Ileana Ladd, Burna Mortimer, SLP     Current Status/Progress Goal Weekly Team Focus  Bowel/Bladder   in/ out cath Q 6hr. Refused Bowel program 1/17, had small BM 1/17  regain B/B function  daily bowel program, In/out Q6 hr >300 ml   Swallow/Nutrition/ Hydration   dys 1 textures and thin liquids, supervision A  Mod I  carryover of alternating solids and liquids and education   ADL's   bathing-min A/mod A; LB dressing-min A/mod A; sit<>stand and functional transfers with CGA/min A; fatigues quickly; mod/max encouragement to participate  supervision overall  activity tolerance, standing balance, BADL training   Mobility   Supervision bed mobility, CGA to min A transfers with RW, gait up to 50 ft with RW min A, w/c mobility Supervision  supervision overall  endurance, balance, standing tolerance, OOB tolerance, BP management    Communication             Safety/Cognition/ Behavioral Observations            Pain   denies pain  pain<3  assess pain Q shift and PRN   Skin   upper back insicion  proper healing, no infection  assess skin Q shift and PRN     Discharge Planning:  Pt currently undergoing radiation treatment; last treatment on 1/20. Pt will need SNF placement as family is unable to assist with 24/7 care. Pt will not be eligible for SNF until he has completed radiation treatment. SNF placement is based on bed availability, and insurance approval.   Team Discussion: Patient going for Peg placement today. Becoming more confused, refusing bowel medications, glaucoma eye drops, and becoming dehydrated. He is being in and out cathed when not fully emptying his bladder, and he is still on a bowel program. He has a bed offer but not sure if they will take him with a Peg tube.  Patient on target to meet rehab goals: He is a min assist for transfers, has low endurance, low motivation. He didn't want to get out of bed this morning. OT assisted with putting on Ted hose. Low BP is a barrier for therapy and he perseverates on medical issues.   *See Care Plan and progress notes for long and short-term goals.   Revisions to Treatment Plan:  Will need IVF's if not taking enough PO fluids.  Teaching Needs: Family educations, wound/skin care education, tube feed education, medication management,   Current Barriers to Discharge: Inaccessible home environment,  Decreased caregiver support, Home enviroment access/layout, Neurogenic bowel and bladder, Wound care, Lack of/limited family support, Medication compliance, Pending chemo/radiation, Behavior and Nutritional means  Possible Resolutions to Barriers: Continue current medications, provide nutritional support, provide emotional support to patient and family.     Medical Summary Current Status: severe malnutrition- more confused; last time did Supp/bowel program Sunday  night; went for Endo- doesn't have IV- i/o caths- getting PEG;  Barriers to Discharge: Behavior;Medication compliance;Decreased family/caregiver support;Home enviroment access/layout;Neurogenic Bowel & Bladder;Incontinence;New oxygen;Weight bearing restrictions;Pending Surgery;Pending chemo/radiation;Other (comments);Weight;Medical stability;Nutrition means  Barriers to Discharge Comments: has a bed- but see if can hold it. ; getting PEG hopefully today; tachycardic- needs IVFs if can't drink/get fluids via PEG- refused OT this AM- d/c 1/24 Possible Resolutions to [employer] Focus: min A transfer- limited by motivation/poor endurance; BP still low- perseverating on medical issues; caught up in his head/upset about CA;   Continued Need for Acute Rehabilitation Level of Care: The patient requires daily medical management by a physician with specialized training in physical medicine and rehabilitation for the following reasons: Direction of a multidisciplinary physical rehabilitation program to maximize functional independence : Yes Medical management of patient stability for increased activity during participation in an intensive rehabilitation regime.: Yes Analysis of laboratory values and/or radiology reports with any subsequent need for medication adjustment and/or medical intervention. : Yes   I attest that I was present, lead the team conference, and concur with the assessment and plan of the team.   Cristi Loron 02/09/2020, 6:18 PM

## 2020-02-09 NOTE — Progress Notes (Signed)
[place] PHYSICAL MEDICINE & REHABILITATION PROGRESS NOTE   Subjective/Complaints:  Pt reports had a small BM with bowel program last night- no documentation of bowel program.  Had small BM -didn't do bowel program yesterday- although did have a small BM.   Supposed to get PEG today-     ROS:  Limited by cognition  Objective:   No results found. Recent Labs    02/08/20 0456 02/09/20 0515  WBC 5.3 5.5  HGB 11.1* 11.2*  HCT 37.2* 35.4*  PLT 262 242   Recent Labs    02/08/20 0456 02/09/20 0515  NA 144 144  K 3.6 3.8  CL 103 105  CO2 27 28  GLUCOSE 76 98  BUN 16 16  CREATININE 0.80 0.73  CALCIUM 10.3 10.2    Intake/Output Summary (Last 24 hours) at 02/09/2020 0839 Last data filed at 02/09/2020 0700 Gross per 24 hour  Intake 15 ml  Output 300 ml  Net -285 ml    Physical Exam: Vital Signs Blood pressure 105/82, pulse (!) 115, temperature 98.3 F (36.8 C), temperature source Oral, resp. rate 18, height 5\' 9"  (1.753 m), weight 61.9 kg, SpO2 100 %. Gen: awake, alert, but confused- Ox2; sitting up slightly in bed; wearing O2 by [place] - is NPO HEENT: lips dry/mouth dry Cardio: tachycardic- in 100s-110s-  Chest: slightly coarse, but good air movement- no W/R/R Abd: Soft, NT, ND, (+)BS -hypoactive Ext: no clubbing, cyanosis, or edema- very tight in L>R upper traps- also bones more prominent -losing weight Psych: refusing meds since "cannot swallow"- irritable Skin: Warm and dry.  Intact. Musc: No edema in extremities.  No tenderness in extremities. Neuro: somewhat confused again Motor: Bilateral upper extremities: Grossly 5/5 proximal distal Bilateral lower extremities: Grossly 4+/5 proximal to distal, stable  Assessment/Plan: 1. Functional deficits which require 3+ hours per day of interdisciplinary therapy in a comprehensive inpatient rehab setting.  Physiatrist is providing close team supervision and 24 hour management of active medical problems listed  below.  Physiatrist and rehab team continue to assess barriers to discharge/monitor patient progress toward functional and medical goals  Care Tool:  Bathing    Body parts bathed by patient: Right arm,Left arm,Chest,Abdomen,Right upper leg,Left upper leg,Face,Front perineal area   Body parts bathed by helper: Buttocks,Right lower leg,Left lower leg Body parts n/a: Front perineal area,Buttocks,Right lower leg,Left lower leg   Bathing assist Assist Level: Minimal Assistance - Patient > 75%     Upper Body Dressing/Undressing Upper body dressing   What is the patient wearing?: Pull over shirt    Upper body assist Assist Level: Set up assist    Lower Body Dressing/Undressing Lower body dressing      What is the patient wearing?: Incontinence brief,Pants     Lower body assist Assist for lower body dressing: Moderate Assistance - Patient 50 - 74%     Toileting Toileting    Toileting assist Assist for toileting: Moderate Assistance - Patient 50 - 74% Assistive Device Comment: BSC   Transfers Chair/bed transfer  Transfers assist     Chair/bed transfer assist level: Minimal Assistance - Patient > 75% Chair/bed transfer assistive device: Museum/gallery exhibitions officer assist      Assist level: Contact Guard/Touching assist Assistive device: Walker-rolling Max distance: 55   Walk 10 feet activity   Assist     Assist level: Contact Guard/Touching assist Assistive device: Walker-rolling   Walk 50 feet activity   Assist Walk 50 feet with 2 turns  activity did not occur: Safety/medical concerns  Assist level: Minimal Assistance - Patient > 75% Assistive device: Walker-rolling    Walk 150 feet activity   Assist Walk 150 feet activity did not occur: Safety/medical concerns         Walk 10 feet on uneven surface  activity   Assist Walk 10 feet on uneven surfaces activity did not occur: Safety/medical concerns          Wheelchair     Assist Will patient use wheelchair at discharge?: Yes Type of Wheelchair: Manual    Wheelchair assist level: Supervision/Verbal cueing Max wheelchair distance: 125'    Wheelchair 50 feet with 2 turns activity    Assist        Assist Level: Supervision/Verbal cueing   Wheelchair 150 feet activity     Assist      Assist Level: Moderate Assistance - Patient 50 - 74%   Blood pressure 105/82, pulse (!) 115, temperature 98.3 F (36.8 C), temperature source Oral, resp. rate 18, height 5\' 9"  (1.753 m), weight 61.9 kg, SpO2 100 %.  Medical Problem List and Plan: 1.  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  1/7- spoke to brother yesterday who said he'd pass on to daughter- sounds like he didn't- palliative care to see pt  1/10- palliative saw pt- is DNAR/DNI now  1/11- called Oncology on call- trying to figure out plan for treatment- was told 12/27 that would do radiation and then immediately do Chemo, however, due to dispo issues (family cannot take home) needs to go to SNF after radiation- spoke at length to Dr Burr Medico- she or Dr Julien Nordmann will see pt today/tomorrow.   1/12- Cannot do PET scan until d/c from hospital. Dr Tommy Medal to see pt Friday  Continue CIR  2.  Antithrombotics: -DVT/anticoagulation:   Continue Heparin.  Mechanical: Sequential compression devices, below knee Bilateral lower extremities   Dopplers negative for DVT  -antiplatelet therapy: N/A 3. Pain Management:    Decreased oxycodone to q4H prn.  DC gabapentin since causing constipation  Added Cymbalta 30 mg daily.   1/3- will increase Duloxetine to 60 mg nightly- for nerve pain  1/5- not taking  pain meds often- encouraged pt to take pain meds before therapy.   1/6- will reduce oxy to Norco q4 hours prn due to increased confusion per family (nursing hasn't seen it)  with pain meds  1/9 pt told me his pain is under control presently  1/10- will place lidoderm patches on shoulders/upper traps B/L where muscles REAL tight.   1/11- can't have lidoderm in >3 patches, but refused yesterday- will check into this.  1/12- saw after left room, pt refused ALL meds this AM-   1/13- no pain meds in 2-4 days- tramadol or Norco 1/15: d/c Norco since patient is not requiring  1/17- will d/c Cymbalta since won't be crushed- also restarted norco for when Tramadol not enough-   1/18- NPO this AM- but con't regimen 4. Mood: LCSW to follow for evaluation and support.              -antipsychotic agents: N/a  Discussed with nursing, he has been sleeping well.  5. Neuropsych: This  patient is capable of making decisions on his own behalf.  Appreciate neuropsych 6. Skin/Wound Care: Routine pressure relief measures.  7. Fluids/Electrolytes/Nutrition: Monitor I/Os.             Added juven to promote healing.  8. Neurogenic bladder: Foley in place due to urinary retention/UTI, with plans to remove on Monday  Flomax daily initiated on 12/30, monitor blood pressure closely  Florinef 0.1 mg daily initiated to assist with low BP.   Improving on 1/2  1/3- will d/c foley this afternoon, after radiation  1/4- wasn't removed- will remove today- this AM and ordered I/o caths and PVRs since might not void.  1/5- so far, no voiding- cathing required 100%   1/6-9- no voiding- caths volumes 300-600c q6 hours  1/10- no voiding- in/out caths being done- pt doesn't want foley back.  1/11- no voiding still  1/12- no voiding- doing I/o caths- nursing doing- will need foley when goes to SNF   1/17- wait on placing foley for now- d/w family- but stop Flomax since cannot be crushed  1/18- will need foley when goes to SNF-  they will not accept with in/out caths 9.  Acute renal failure: Resolved  Creatinine 0.86 on 12/27, 0.78 on 1/14, repeat weekly.  10. Acute blood loss anemia:   Hemoglobin 12.4 on 12/28, 10.5 on 1/14, repeat weekly.   Continue to monitor 11. Metastatic Lung AdenoCA:  On dulera--has been on supplemental oxygen  1/4- didn't get radiation yesterday- is scheduled ot go back today.  1/6- Getting radiation daily M-F  1/13- Dr Julien Nordmann to see Friday- cannot get PET scan since inpt- explained to family.   12. Kleb/Proteus UTI: Completed course of Cipro 13. 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    1/12- will give enema tonight per request of nursing  1/15: had large BM yesterday- continue bowel program  1/16: Had BM yesterday  1/18- had small BM last night- refused bowel program- not documented 14. Hypotension/tachycardia-   Severe orthostasis, negative on 12/31  Relatively controlled on 1/2  TEDs/abdominal binder for support.   1/5- added midodrine 5 mg TID-AC for low Bps  1/7- increased Midodrine to 10 mg TID- will also increase Florinef to help with orthostatic hypotension. It's really bad when goes to radiation  1/9 bp more robust with recent changes  1/12- doing better today per OT, but then refused all AM meds-   1/13- tachycardia to 120s- will check EKG and labs to see if dehydrated-Last BUN 25- will give IVFs D5NS 75cc/hour x 1 days and monitor  1/15: BP better controlled  1/17- Pt refusing all meds including florinef and Midodrine- will back down doses slightly since hasn't taken in days- when restarts with Cortrak  1/18-  decreased florinef to 0.1 mg and Midodrine to 5mg  TID with meals  15.  Sleep disturbance: Scheduled melatonin at nights.   Trazodone 100  1/3- sleeping well- con't regimen 17. Tenting/mild dehydration- will push 6 cups/water daily minimum  1/6- skin very dry- order eucerin AND push PO fluids 18. Hyperkalemia  Lokelma ordered on  12/23  Potassium 4.6 on 12/27 19. Pancoast tumor/ metastatic lung cancer:   Radiation to begin on 1/3 and to see Dr Julien Nordmann 1/3 at 2:15 pm  1/4- didn't get radiation- will make sure scheduled to  go back today  1/5- made sure pt went to radiation -his BP was low- but gave him 500cc IVFs NS bolus to help with BP- BP was 82/50s.   1/7- Increased Midodrine and florienf to help with low BP.   1/14: radiation today/ 20. Dysphagia per pt  12/30- ordered SLP eval and treat  1/3- placed on D3 thin diet  1/4- is better when sitting up at 90 degrees  1/6- will add Protonix for reflux Sx's.   1/8 MBS performed 1/7--mod oral/mild pharyng dysphagia   -D1, thins recommended by SLP, meds with puree  1/10-  Pt says doesn't help much with D1 thins.   1/12- per SLP, working on D2, but without teeth, cannot do D2 right now until passed trials  1/17- will place Cortrak and try to get PEG ASAP via general surgery- d/w family both and know if we don't do, he won't make it more than another few weeks.   1/18- supposed to get PEG today- with anesthesia 21. Hypercalcemia  1/4- is down to 11 from 11.2- will monitor- if goes back up more, will treat. 22. Poor appetite  1/10- will try Marinol 2.5 mg BID  1/12- not working as of yet- placed nutrition consult since not taking supplements - "too sticky"  1/17- stopped due to increased confusion.  22. Depression  1/11- on Cymbalta but cannot add Wellbutrin- has allergy- will put small dose of Celexa 10 mg daily on board-so he doesn't have issues with serotonin syndrome.   1/12- think it's the reason pt refused meds this AM, encouraged pt to  take his meds for low BP, depression, et  1/17- changed cymbalta to celex 20 mg since can be crushed.   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  1/13- want to make sure doesn't have stroke- will check head CT asap. Also called Ophtho- they said call back when confusion better and will examine.  Until then, cannot.   1/17- head CT/ looks OK- no stroke- found out was refusing glaucoma meds 24. Confusion  1/13- could be marinol, since not receiving pain meds- will stop marinol 25. Severe malnutrition  1/13- has lost 16 lbs in last 3 weeks from 12/21- d/w family about a PEG-  And they agreed- cannot get from IR because hx of hernia repair with mesh- will call gen surgery.   1/17- getting PEG as soon as possible 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 Diastolic dysfunction, grade 1: 1/14 ECHO reviewed       LOS: 28 days A FACE TO FACE EVALUATION WAS PERFORMED  Jessika Rothery 02/09/2020, 8:39 AM

## 2020-02-09 NOTE — Progress Notes (Signed)
Physical Therapy Session Note  Patient Details  Name: Gregory Bautista MRN: [id number] Date of Birth: Apr 25, 1940  Today's Date: 02/09/2020 PT Individual Time: 0900-1000 PT Individual Time Calculation (min): 60 min   Short Term Goals: Week 4:  PT Short Term Goal 1 (Week 4): =LTG due to ELOS  Skilled Therapeutic Interventions/Progress Updates:    Pt received semi-reclined in bed with LE hanging off the bed and pt scooted down in bed. Pt reports pain "all over my body". Nursing notified but pt unable to receive pain medication at this time due to NPO status for upcoming procedure. Pt agreeable to participate in therapy session. Pt agreeable to reposition in bed. Pt requires max cueing and assist for for rolling R/L, scooting hips laterally, and scooting up towards [facility] with use of bedrails and bed in reverse Trendelenburg position. Supine BP 116/70. Assisted pt with donning thigh-high TED hose and ACE wraps to BLE. Supine BP 126/82. Supine to sit with min A for trunk control this date. Seated BP 89/69, pt reports mild dizziness. Assisted pt with donning abdominal binder. Seated BP 101/76. Pt also reports pain/stiffness in upper back and shoulder region. Reviewed shoulder rolls and attempted to have pt perform stretches. Pt with fair understanding of purpose of exercises/stretches and how they will help with his pain/stiffness and perseverates on pain he is experiencing. Pt returned to supine with min A for some LE management. Pt with frequent existential conversations throughout session questioning why he is going through what he is going through and why this is happening to him, very internally distracted. Provided emotional support and pt on list to see neuropsychology this week. Pt left in R sidelying in bed with needs in reach, bed alarm in place at end of session.  Therapy Documentation Precautions:  Precautions Precautions: Back,Fall Precaution Comments: reviewed  precautions Restrictions Weight Bearing Restrictions: No    Therapy/Group: Individual Therapy   Excell Seltzer, PT, DPT  02/09/2020, 10:57 AM

## 2020-02-09 NOTE — Progress Notes (Addendum)
Occupational Therapy Note  Patient Details  Name: Gregory Bautista MRN: [id number] Date of Birth: 03/30/1940  Today's Date: 02/09/2020 OT Missed Time: 50 Minutes Missed Time Reason: Patient fatigue;Other (comment)  Pt resting in bed upon arrival. Pt declined participation in therapy or getting OOB, stating "I have too much going on right now." Pt partially covered with blankets and declined additional blankets or covering up more, stating "not right now." Pt pleasant but adamant that he did not want to get OOB. Pt missed 60 mins skilled OT services.   Leotis Shames [facility] 02/09/2020, 7:29 AM

## 2020-02-09 NOTE — Progress Notes (Signed)
Dulcolax suppository given at 1800. No stool felt in rectum upon assessment. No results until end of shift. Reported off to oncoming staff.   Gerald Stabs, RN

## 2020-02-09 NOTE — Progress Notes (Signed)
Patient ID: Gregory Bautista, male   DOB: [birth year], 80 y.o.   MRN: [id number]  SW waiting on updates from [facility] on if they are able to extend a bed offer. SW waiting on updates.   *Per medical team, pt now has a PEG tube. This will not impact his radiation treatment per RN Maudie Mercury with Dr.Kinard's office.   [name], MSW, [place] Office: [phone] Cell: [phone] Fax: [phone]

## 2020-02-09 NOTE — Plan of Care (Signed)
  Problem: RH Ambulation Goal: LTG Patient will ambulate in controlled environment (PT) Description: LTG: Patient will ambulate in a controlled environment, # of feet with assistance (PT). Flowsheets (Taken 02/09/2020 1522) LTG: Pt will ambulate in controlled environ  assist needed:: (downgrade due to slow progress) -- LTG: Ambulation distance in controlled environment: 75 ft with LRAD Note: Downgrade due to slow progress Goal: LTG Patient will ambulate in home environment (PT) Description: LTG: Patient will ambulate in home environment, # of feet with assistance (PT). Flowsheets (Taken 02/09/2020 1522) LTG: Pt will ambulate in home environ  assist needed:: (downgrade due to slow progress) -- LTG: Ambulation distance in home environment: 50 ft with LRAD Note: Downgrade due to slow progress

## 2020-02-09 NOTE — Progress Notes (Addendum)
Nutrition Follow-up  DOCUMENTATION CODES:   Severe malnutrition in context of acute illness/injury  INTERVENTION:   Initiate tube feeds via PEG at 1630 today: - Start Osmolite 1.5 @ 20 ml/hr and advance by 10 ml q 8 hours to goal rate of 60 ml/hr (tube feeding can be held for up to 4 hours for therapies) - ProSource TF 45 ml BID  Tube feeding regimen at goal provides 1880 kcal, 97 grams of protein, and 914 ml of H2O.   - MVI with minerals daily per tube  - d/c oral nutrition supplements given NPO status  NUTRITION DIAGNOSIS:   Severe Malnutrition related to acute illness as evidenced by energy intake < or equal to 50% for > or equal to 5 days,percent weight loss (10.9% weight loss in less than 1 month).  Ongoing  GOAL:   Patient will meet greater than or equal to 90% of their needs  Progressing  MONITOR:   Diet advancement,Labs,Weight trends,TF tolerance,I & O's  REASON FOR ASSESSMENT:   Consult Assessment of nutrition requirement/status  ASSESSMENT:   80 year old male with PMH of smoking. 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 Tumor was not felt to be resectable due to advanced disease. Pt was admitted on 12/30/19 for T2-T4 laminectomy for tumor resection with T1-T5 posterior arthrodesis with pedicle screws and rods. 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 Admitted to CIR on 12/21.  1/07 - MBS with recommendations for dysphagia 1 diet with thin liquids 1/18 - s/p upper GI endoscopy, PEG placement by Surgery  Discussed pt with Surgery PA. Okay to start tube feeds today at 1630 as PEG was placed today at 1230. Discussed with RN.  Will start tube feeds at trickle rate and slowly advance to goal. Discussed with RN.  Attempted to see pt in room. However, pt had already  left for radiation treatments today.  Will change MVI with minerals daily from PO to per tube as pt reports continued difficulties with swallowing.  Pt has been NPO since midnight for PEG today. PO intake prior to NPO status was mostly 0-5% at meals.  Medications reviewed and include: dulcolax, colace, melatonin, MVI with minerals, miralax, psyllium, senna  Labs reviewed: prealbumin 5.6  NUTRITION - FOCUSED PHYSICAL EXAM:  Unable to complete at this time. Pt in radiation treatments.  Diet Order:   Diet Order            Diet NPO time specified Except for: Sips with Meds  Diet effective midnight                 EDUCATION NEEDS:   Not appropriate for education at this time  Skin:  Skin Assessment: Skin Integrity Issues: Incisions: back  Last BM:  02/08/20 small type 4  Height:   Ht Readings from Last 1 Encounters:  01/12/20 5\' 9"  (1.753 m)    Weight:   Wt Readings from Last 1 Encounters:  02/06/20 61.9 kg    BMI:  Body mass index is 20.15 kg/m.  Estimated Nutritional Needs:   Kcal:  2000-2200  Protein:  100-115 grams  Fluid:  >/= 2.0 L    Gustavus Bryant, MS, RD, LDN Inpatient Clinical Dietitian Please see AMiON for contact information.

## 2020-02-09 NOTE — Progress Notes (Signed)
I followed up on Gregory Bautista molecular test results with [facility].  I printed and notified Dr. Worthy Flank nurse and PA.

## 2020-02-09 NOTE — Anesthesia Preprocedure Evaluation (Signed)
Anesthesia Evaluation  Patient identified by MRN, date of birth, ID band Patient awake    Reviewed: Allergy & Precautions, NPO status , Patient's Chart, lab work & pertinent test results  Airway Mallampati: II  TM Distance: >3 FB     Dental   Pulmonary asthma , former smoker,    breath sounds clear to auscultation       Cardiovascular  Rhythm:Regular Rate:Normal     Neuro/Psych    GI/Hepatic Neg liver ROS, History noted CG   Endo/Other  negative endocrine ROS  Renal/GU negative Renal ROS     Musculoskeletal   Abdominal   Peds  Hematology   Anesthesia Other Findings   Reproductive/Obstetrics                             Anesthesia Physical Anesthesia Plan  ASA: III  Anesthesia Plan: MAC   Post-op Pain Management:    Induction: Intravenous  PONV Risk Score and Plan: 1 and Ondansetron, Dexamethasone and Midazolam  Airway Management Planned: Nasal Cannula and Simple Face Mask  Additional Equipment:   Intra-op Plan:   Post-operative Plan:   Informed Consent: I have reviewed the patients History and Physical, chart, labs and discussed the procedure including the risks, benefits and alternatives for the proposed anesthesia with the patient or authorized representative who has indicated his/her understanding and acceptance.     Dental advisory given  Plan Discussed with: CRNA and Anesthesiologist  Anesthesia Plan Comments:         Anesthesia Quick Evaluation

## 2020-02-09 NOTE — Progress Notes (Signed)
Speech Language Pathology Weekly Progress and Session Note  Patient Details  Name: Gregory Bautista MRN: [id number] Date of Birth: 05-19-1940  Beginning of progress report period: February 01, 2020 End of progress report period: February 09, 2020  Today's Date: 02/09/2020 SLP Individual Time: Missed 30 minutes due to PEG placement    Short Term Goals: Week 2: SLP Short Term Goal 1 (Week 2): Pt will consume current diet with use of swallow and esphogeal strategies mod I. SLP Short Term Goal 1 - Progress (Week 2): Not met    New Short Term Goals: Week 3: SLP Short Term Goal 1 (Week 3): STG=LTG due to short ELOS   Weekly Progress Updates:  Pt did not meet goals this reporting period due to reduced ability to carry over swallow strategies, suggest from decline in cognition from on going radiation. SLP downgraded goals to supervision A. PEG tube is being placed today due to pt refusing medication and PO intake. Pt is consuming dys 1 textures and thin liquids with min A verbal cues to utilize alternating solids and liquids to reduce globus sensation. SLP recommends facilitating carryover of swallow strategies with continued full supervision and education prior to discharge. Pt would continue to benefit from skilled ST services in order to maximize functional independence and reduce burden of care, requiring 24 hour supervision at discharge.   Intensity: Minumum of 1-2 x/day, 30 to 90 minutes Frequency: 1 to 3 out of 7 days Duration/Length of Stay: 1/24 Treatment/Interventions: Dysphagia/aspiration precaution training;Cueing hierarchy   Daily Session  Skilled Therapeutic Interventions: Pt missed 30 minutes of treatment time due to being out of room for PEG placement procure.      General    Pain Pain Assessment Pain Scale: 0-10 Pain Score: 7  Faces Pain Scale: Hurts little more Pain Type: Chronic pain Pain Location: Back Pain Orientation: Upper;Lower Pain Descriptors / Indicators:  Discomfort Pain Onset: On-going Pain Intervention(s): Repositioned;Other (Comment) (currently NPO)  Therapy/Group: Individual Therapy  Ruairi Stutsman  [facility] 02/09/2020, 11:56 AM

## 2020-02-09 NOTE — Anesthesia Postprocedure Evaluation (Signed)
Anesthesia Post Note  Patient: Gregory Bautista  Procedure(s) Performed: PERCUTANEOUS ENDOSCOPIC GASTROSTOMY (PEG) PLACEMENT (N/A )     Patient location during evaluation: Endoscopy Anesthesia Type: MAC Level of consciousness: awake Pain management: pain level controlled Vital Signs Assessment: post-procedure vital signs reviewed and stable Respiratory status: spontaneous breathing Cardiovascular status: stable Postop Assessment: no apparent nausea or vomiting Anesthetic complications: no   No complications documented.  Last Vitals:  Vitals:   02/09/20 1240 02/09/20 1250  BP: 130/71 131/78  Pulse: (!) 121 (!) 117  Resp: 15 16  Temp: 36.7 C   SpO2: 100% 97%    Last Pain:  Vitals:   02/09/20 1250  TempSrc:   PainSc: 7                  Mckenzye Cutright

## 2020-02-09 NOTE — Progress Notes (Signed)
Progress Note     Subjective: Patient scheduled for PEG at 12:00 PM today. Updated patient and patient's brother. Consent obtained and witnessed by RN.   Objective: Vital signs in last 24 hours: Temp:  [98.2 F (36.8 C)-98.5 F (36.9 C)] 98.3 F (36.8 C) (01/18 0809) Pulse Rate:  [107-123] 115 (01/18 0809) Resp:  [15-20] 18 (01/18 0809) BP: (104-134)/(71-84) 105/82 (01/18 0809) SpO2:  [94 %-100 %] 100 % (01/18 0809) Last BM Date: 02/06/20  Intake/Output from previous day: 01/17 0701 - 01/18 0700 In: 45 [P.O.:45] Out: 300 [Urine:300] Intake/Output this shift: No intake/output data recorded.  PE: General: pleasant, WD, thin male who is sitting up in wheelchair HEENT: frontal lesion about the size of a quarter.  Sclera are noninjected.  PERRL.  Ears and nose without any masses or lesions.  Mouth is pink and moist Heart: regular, rate, and rhythm.  Normal s1,s2. No obvious murmurs, gallops, or rubs noted.  Palpable radial and pedal pulses bilaterally Lungs: CTAB, no wheezes, rhonchi, or rales noted.  Respiratory effort nonlabored Abd: soft, NT, ND, +BS, prior surgical scars (chevron and midline) MS: all 4 extremities are symmetrical with no cyanosis, clubbing, or edema. Skin: warm and dry with no masses, lesions, or rashes Neuro: speech clear and follows commands Psych: A&Ox3 with an appropriate affect.   Lab Results:  Recent Labs    02/08/20 0456 02/09/20 0515  WBC 5.3 5.5  HGB 11.1* 11.2*  HCT 37.2* 35.4*  PLT 262 242   BMET Recent Labs    02/08/20 0456 02/09/20 0515  NA 144 144  K 3.6 3.8  CL 103 105  CO2 27 28  GLUCOSE 76 98  BUN 16 16  CREATININE 0.80 0.73  CALCIUM 10.3 10.2   PT/INR No results for input(s): LABPROT, INR in the last 72 hours. CMP     Component Value Date/Time   NA 144 02/09/2020 0515   K 3.8 02/09/2020 0515   CL 105 02/09/2020 0515   CO2 28 02/09/2020 0515   GLUCOSE 98 02/09/2020 0515   BUN 16 02/09/2020 0515   CREATININE  0.73 02/09/2020 0515   CALCIUM 10.2 02/09/2020 0515   PROT 7.5 02/04/2020 1132   ALBUMIN 2.2 (L) 02/05/2020 0520   AST 22 02/04/2020 1132   ALT 27 02/04/2020 1132   ALKPHOS 59 02/04/2020 1132   BILITOT 1.0 02/04/2020 1132   GFRNONAA >60 02/09/2020 0515   GFRAA >60 03/24/2015 1125   Lipase  No results found for: LIPASE     Studies/Results: No results found.  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 Depression Confusion   Dysphagia and poor PO intake - SLP following, patient reports feeling choked on food and has had poor  PO intake Severe malnutrition - check prealbumin with AM labs tomorrow  Consult for open gastrostomy placement - Plan to attempt PEG placement in endoscopy today - consent filled out with telephone consent from patient's brother and placed in chart   FEN: NPO today  VTE: SQ heparin  ID: no current abx  LOS: 28 days    Norm Parcel , [facility] 02/09/2020, 8:59 AM Please see Amion for pager number during day hours 7:00am-4:30pm

## 2020-02-09 NOTE — Plan of Care (Signed)
  Problem: RH Swallowing Goal: LTG Patient will consume least restrictive diet using compensatory strategies with assistance (SLP) Description: LTG:  Patient will consume least restrictive diet using compensatory strategies with assistance (SLP) Flowsheets (Taken 02/09/2020 1157) LTG: Pt Patient will consume least restrictive diet using compensatory strategies with assistance of (SLP): (due to decline in cognitive skills from medical status) Supervision Note: Due to decline in cognitive skills from to medical status

## 2020-02-10 ENCOUNTER — Encounter (HOSPITAL_COMMUNITY): Payer: Medicare Other | Admitting: Psychology

## 2020-02-10 ENCOUNTER — Inpatient Hospital Stay (HOSPITAL_COMMUNITY): Payer: Medicare Other

## 2020-02-10 ENCOUNTER — Inpatient Hospital Stay (HOSPITAL_COMMUNITY): Payer: Medicare Other | Admitting: Physical Therapy

## 2020-02-10 ENCOUNTER — Ambulatory Visit
Admission: RE | Admit: 2020-02-10 | Discharge: 2020-02-10 | Disposition: A | Discharge status: Home / Self Care | Payer: Medicare Other | Source: Ambulatory Visit | Attending: Radiation Oncology | Admitting: Radiation Oncology

## 2020-02-10 ENCOUNTER — Encounter (HOSPITAL_COMMUNITY): Payer: Self-pay | Admitting: General Surgery

## 2020-02-10 DIAGNOSIS — R918 Other nonspecific abnormal finding of lung field: Secondary | ICD-10-CM | POA: Diagnosis not present

## 2020-02-10 MED ORDER — FREE WATER
200.0000 mL | Freq: Three times a day (TID) | Status: DC
  Administered 2020-02-10 – 2020-02-15 (×22): 200 mL

## 2020-02-10 NOTE — Progress Notes (Signed)
Physical Therapy Session Note  Patient Details  Name: Gregory Bautista MRN: [id number] Date of Birth: 08-Nov-1940  Today's Date: 02/10/2020 PT Individual Time: 0945-1100 PT Individual Time Calculation (min): 75 min   Short Term Goals:  Week 5:  PT Short Term Goal 1 (Week 5): =LTG due to ELOS  Skilled Therapeutic Interventions/Progress Updates:    Pt received semi-reclined in bed, agreeable to PT session. Pt reports 6/10 pain in L lateral chest/axillary region. Nursing able to provide pain medication at beginning of session. Semi-reclined BP 105/65. Assisted pt with donning thigh-high TEDs and ACE wrap. SpO2 100% while on 2L O2, decreased to 1L O2 via [place] throughout session and SpO2 remains at 97% and higher. Semi-reclined to sitting EOB with Supervision with cues for log-roll technique, use of bedrail and HOB elevated. Seated BP 91/68. Assisted pt with donning abdominal binder. Seated BP 95/65 with THT, ACE, and abdominal binder. Stand pivot transfer w/c with RW and min A, increased unsteadiness in standing noted this date. While seated in w/c at sink pt able to perform oral hygiene with setup A. Manual w/c propulsion 3 x 50 ft at Supervision level with use of BUE for global endurance training. Sit to stand with min A to RW. Standing BP 73/57 and pt becomes dizzy with knees buckling. Returned to sitting in w/c. Deferred further standing activity this session due to ongoing [place]. Stand pivot transfer w/c to/from [name] with RW and min A. [name] level 4 x 5 min with use of B UE/LE x 5 min for global endurance training. Seated BP following exercise 98/65, SpO2 97% on 1L O2. Pt left seated in w/c in room with needs in reach, quick release belt and chair alarm in place at end of session.  Therapy Documentation Precautions:  Precautions Precautions: Back,Fall Precaution Comments: reviewed precautions Restrictions Weight Bearing Restrictions: No   Therapy/Group: Individual Therapy   Excell Seltzer,  PT, DPT  02/10/2020, 1:38 PM

## 2020-02-10 NOTE — Consult Note (Signed)
Neuropsychological Consultation   Patient:   Gregory Bautista   DOB:   1941-01-19  MR Number:  [id number]  Location:  [facility] [address] C[facility] [address] [place] [id number] [place] [address] Dept: [place]: [phone]           Date of Service:   02/10/2020  Start Time:   1 PM End Time:   2 PM  Provider/Observer:  Ilean Skill, Psy.D.       Clinical Neuropsychologist       Billing Code/Service: [phone]  Chief Complaint:    Sheryl Towell is a 80 year old male with history of smoking, 50-month history of progressive left chest wall pain progressing to bilateral lower extremity weakness and difficulty walking as well as difficulty voiding.  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  Tumor was not felt to be resectable due to advanced disease and spine stabilization recommended with plans for chemo and radiation in the future.  Patient was admitted on 12/30/2019 for T2-T4 laminectomy for tumor resection with T1-T5 posterior hardware placed.  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  Plans for radiation were set.  Patient will follow-up with oncology on an outpatient basis as well.  Hospital course has been complicated due to acute renal failure, overflow incontinence with bladder volumes up to 1250 mL and failed voiding trials.  Patient remains on supplemental oxygen.  Patient continues to have limitations due to bilateral lower extremity weakness and instability, oxygen dependence and CIR was recommended due to functional decline.  Patient has not been going through rehab therapies with eventual plan for discharge to skilled nursing.  Reason for Service:  Patient was referred for neuropsychological consultation  due to coping and adjustment issues in dealing with extended hospital stay, recent diagnosis of thoracic vertebral spine erosion due to cancer.  Below see HPI for the current admission.  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  Tumor was not felt to be resectable due to advanced disease and spine stabilization recommended with plans for chemo and XRT in the future.  He was evaluated by Dr. Arnoldo Morale and was admitted on 12/30/2019 for T2-T4 laminectomy for tumor resection with T1-T5 posterior arthrodesis with pedicle screws and rods.  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  Plans are for XRT by Dr. Randa Ngo with reports to of simulation set for 12/27. Had been set for follow up with Dr. Earlie Server on outpatient basis.   Hospital course has been significant for acute renal failure, overflow incontinence with bladder volumes up to 1250 ml and failed voiding trial therefore foley replaced as well as hypoxia with activity-->remains on supplemental oxygen.  Urine culture was done showing evidence of Proteus and Klebsiella UTI and he was started on Cipro today. He has had ongoing issues with resting tachycardia felt to be due to dehydration. Patient continues to be limited by BLE weakness with  instability, left chest pain with burning and has been weaned off oxygen. He continues to have limitations in mobility and ADLS. CIR recommended due to functional decline.   Current Status:  Patient was sitting in his wheelchair with supplemental oxygen in place upon entering the room.  The patient was alert and oriented although  limited in depth of understanding about overall medical status and plan going forward.  Patient was aware the plan for skilled nursing transfer post discharge and did not voice any concern or difficulty with this plan.  Patient describes some confusion about further interventions for his spine involvement.  Patient described issues with some pain with that overall he was coping fairly well.  Patient denied any significant levels of anxiety or depression relating to deleterious impact on his involvement and participation in therapeutic efforts.  Behavioral Observation: Dagoberto Reef  presents as a 80 y.o.-year-old Right African American Male who appeared his stated age. his dress was Appropriate and he was Well Groomed and his manners were Appropriate to the situation.  his participation was indicative of Appropriate and Redirectable behaviors.  There were physical disabilities noted.  he displayed an appropriate level of cooperation and motivation.     Interactions:    Active Redirectable  Attention:   abnormal and attention span appeared shorter than expected for age  Memory:   within normal limits; recent and remote memory intact  Visuo-spatial:  not examined  Speech (Volume):  low  Speech:   normal; normal  Thought Process:  Coherent and Relevant  Though Content:  WNL; not suicidal and not homicidal  Orientation:   person, place and time/date  Judgment:   Fair  Planning:   Poor  Affect:    Appropriate  Mood:    Dysphoric  Insight:   Fair  Intelligence:   low  Medical History:   Past Medical History:  Diagnosis Date  . Allergic rhinitis    NEC  . Asthma   . Glaucoma   . Lung cancer, main bronchus ([place]) 12/2019  . Tobacco use disorder 09/24/2019         Patient Active Problem List   Diagnosis Date Noted  . Protein-calorie malnutrition, severe 02/04/2020  . Drug-induced constipation   . Neurogenic bowel   . Hyperkalemia   . Neurogenic bladder   . Sleep  disturbance   . Postoperative pain   . 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 Mass of upper lobe of left lung 12/24/2019  . Tobacco use disorder 09/24/2019    Psychiatric History:  No prior psychiatric history.  Family Med/Psych History:  Family History  Problem Relation Age of Onset  . Cancer Mother   . Heart attack Father   . Cancer Brother    Impression/DX:  Allante Whitmire is a 80 year old male with history of smoking, 52-month history of progressive left chest wall pain progressing to bilateral lower extremity weakness and difficulty walking as well as difficulty voiding.  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  Tumor was not felt to be resectable due to advanced disease and spine stabilization recommended with plans for chemo and radiation in the future.  Patient was admitted on 12/30/2019 for T2-T4 laminectomy for tumor resection with T1-T5 posterior  hardware placed.  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  Plans for radiation were set.  Patient will follow-up with oncology on an outpatient basis as well.  Hospital course has been complicated due to acute renal failure, overflow incontinence with bladder volumes up to 1250 mL and failed voiding trials.  Patient remains on supplemental oxygen.  Patient continues to have limitations due to bilateral lower extremity weakness and instability, oxygen dependence and CIR was recommended due to functional decline.  Patient has not been going through rehab therapies with eventual plan for discharge to skilled nursing.  Patient was sitting in his wheelchair with supplemental oxygen in place upon entering the room.  The patient was alert and oriented although  limited in depth of understanding about overall medical status and plan going forward.  Patient was aware the plan for skilled nursing transfer post discharge and did not voice any concern or difficulty with this plan.  Patient describes some confusion about further interventions for his spine involvement.  Patient described issues with some pain with that overall he was coping fairly well.  Patient denied any significant levels of anxiety or depression relating to deleterious impact on his involvement and participation in therapeutic efforts.  Disposition/Plan:  Today we worked on coping and adjustment issues.  Diagnosis:    Constipation  Drug-induced constipation  Constipation, unspecified constipation type  Dysphagia - Plan: CANCELED: IR GASTROSTOMY TUBE MOD SED, CANCELED: IR GASTROSTOMY TUBE MOD SED  Follow up - Plan: DG UGI W SINGLE CM (SOL OR THIN BA), DG UGI W SINGLE CM (SOL OR THIN BA)  Thoracic myelopathy         Electronically Signed   _______________________ Ilean Skill, Psy.D. Clinical Neuropsychologist

## 2020-02-10 NOTE — Progress Notes (Signed)
Occupational Therapy Session Note  Patient Details  Name: Gregory Bautista MRN: [id number] Date of Birth: 1940/08/25  Today's Date: 02/10/2020 OT Individual Time: 0815-0900 OT Individual Time Calculation (min): 45 min    Short Term Goals: Week 5:  OT Short Term Goal 1 (Week 5): Pt will don pants with min A sit<>stand from w/c or EOB OT Short Term Goal 2 (Week 5): Pt will perform toilet transfers with CGA using RW OT Short Term Goal 3 (Week 5): Pt will tolerate standing for 3 mins during functional tasks/ADLs with CGA  Skilled Therapeutic Interventions/Progress Updates:   Pt received supine with c/o pain 7/10, mid back and radiating around to ribs, no request for intervention. Pt agreeable to OOB bathing/dressing.  Pt on .5L of O2 upon entry and SpO2 at 88-90%. Titrated O2 to 2L and provided cueing for breathing and he remained at 88-90 % SpO2 for several minutes before coming up to 95%.  Pt transferred to EOB with min A. He required min-mod A for stand pivot transfer to the w/c with mod cueing for technique and hand placement. Following transfer, SpO2 at 94% on 2L [place]. Vitals were stable the remainder of the session. Pt completed UB bathing with very slow pace with supervision overall. Pt stood for LB, requiring min A for posterior washing. Max A provided for LB dressing 2/2 time constraints. Pt returned to supine in bed and was left with all needs met, bed alarm set.   Therapy Documentation Precautions:  Precautions Precautions: Back,Fall Precaution Comments: reviewed precautions Restrictions Weight Bearing Restrictions: No  Therapy/Group: Individual Therapy  Curtis Sites 02/10/2020, 6:42 AM

## 2020-02-10 NOTE — Progress Notes (Addendum)
Patient ID: Gregory Bautista, male   DOB: 1940-11-16, 80 y.o.   MRN: [id number]  SW waiting on SNF preferred locations from family.   SW updated liaison [employer] on pt now new PEG, and inquired if the SNF is still able to extend bed offer. SW waiting on follow-up.   SW spoke with Nikki/Admissions with [facility] who will review pt referral.   SW spoke with Tina/ADmissions with [facility] [phone]) to discuss referral. Reports will review and will follow-up.  SW left message for Loie/Admissions with [place] [phone]) to discuss referral. Sw waiting on follow-up.    SW left message for Kitty/Admissions with [facility]  [phone]) to discuss referral. SW waiting on follow-up.                                                                                                                        SW left message for Debbie/Admissions with [facility] ([phone]) SW waiting on follow-up.                                                                                                                                                  [name], MSW, [address] Office: [phone] Cell: [phone] Fax: [phone]

## 2020-02-10 NOTE — Progress Notes (Signed)
Progress Note  1 Day Post-Op  Subjective: Patient reports some pain in left chest this AM, getting EKG. Denies SOB to me. Denies abdominal pain. Denies nausea and reports passing flatus, BM recorded for PM shift yesterday.   Objective: Vital signs in last 24 hours: Temp:  [97.5 F (36.4 C)-99 F (37.2 C)] 98.6 F (37 C) (01/19 0755) Pulse Rate:  [101-123] 120 (01/19 0755) Resp:  [15-20] 20 (01/19 0755) BP: (98-135)/(64-78) 98/66 (01/19 0755) SpO2:  [94 %-100 %] 95 % (01/19 0755) Last BM Date: 02/08/20  Intake/Output from previous day: 01/18 0701 - 01/19 0700 In: 500 [I.V.:500] Out: 885 [Urine:880; Blood:5] Intake/Output this shift: No intake/output data recorded.  PE: General: pleasant, WD,thinmale who is sitting up in wheelchair HEENT:frontal lesion about the size of a quarter. Sclera are noninjected. PERRL. Ears and nose without any masses or lesions. Mouth is pink and moist Heart: regular, rate, and rhythm. Normal s1,s2. No obvious murmurs, gallops, or rubs noted. Palpable radial and pedal pulses bilaterally Lungs: CTAB, no wheezes, rhonchi, or rales noted. Respiratory effort nonlabored Abd: soft, NT, ND, +BS,prior surgical scars (chevron and midline), PEG in LUQ c/d/i  MS: all 4 extremities are symmetrical with no cyanosis, clubbing, or edema. Skin: warm and dry with no masses, lesions, or rashes Neuro:speech clear and follows commands Psych: A&Ox3 with an appropriate affect.   Lab Results:  Recent Labs    02/08/20 0456 02/09/20 0515  WBC 5.3 5.5  HGB 11.1* 11.2*  HCT 37.2* 35.4*  PLT 262 242   BMET Recent Labs    02/08/20 0456 02/09/20 0515  NA 144 144  K 3.6 3.8  CL 103 105  CO2 27 28  GLUCOSE 76 98  BUN 16 16  CREATININE 0.80 0.73  CALCIUM 10.3 10.2   PT/INR No results for input(s): LABPROT, INR in the last 72 hours. CMP     Component Value Date/Time   NA 144 02/09/2020 0515   K 3.8 02/09/2020 0515   CL 105 02/09/2020 0515    CO2 28 02/09/2020 0515   GLUCOSE 98 02/09/2020 0515   BUN 16 02/09/2020 0515   CREATININE 0.73 02/09/2020 0515   CALCIUM 10.2 02/09/2020 0515   PROT 7.5 02/04/2020 1132   ALBUMIN 2.2 (L) 02/05/2020 0520   AST 22 02/04/2020 1132   ALT 27 02/04/2020 1132   ALKPHOS 59 02/04/2020 1132   BILITOT 1.0 02/04/2020 1132   GFRNONAA >60 02/09/2020 0515   GFRAA >60 03/24/2015 1125   Lipase  No results found for: LIPASE     Studies/Results: No results found.  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 Call and follow up as needed for PEG     LOS: 29 days    Norm Parcel , [facility] Surgery 02/10/2020,  9:42 AM Please see Amion for pager number during day hours 7:00am-4:30pm

## 2020-02-10 NOTE — Progress Notes (Signed)
Patient refused scheduled suppository due at 1800.LBM was 02/09/20. Patient was bladder scanned at 1600 for 191 mL. Patient has not voided all day. Attempted to in and out cath patient twice with failed attempts. Patient scanned again at 1800 for 208 mL. Patient denies any pain or pressure. Patient was asked if another cath attempt could be made and patient refused.

## 2020-02-10 NOTE — Progress Notes (Addendum)
The Pt. is out of the room at the time of the chaplain's spiritual care F/U visit.  *1540  The Pt. has returned to his room and the chaplain is present for F/U spiritual care.    The chaplain understands the Pt. is hopeful the PEG will improve his strength.  At the time of the visit, the Pt. states he is very tired and can barely hold his eyes open. The chaplain understands the Pt. is comfortable with the bed position and steps out of the room.  This chaplain is available for F/U spiritual care as needed.

## 2020-02-10 NOTE — Progress Notes (Signed)
Gregory Bautista   Subjective/Complaints:  Pt did dig stim/bowel program last night- no results, but hasn't been eating really at all.   PEG done yesterday and TFs started- pt said feeling better now that got some nutrition.    ROS:  Limited by cognition  Objective:   No results found. Recent Labs    02/08/20 0456 02/09/20 0515  WBC 5.3 5.5  HGB 11.1* 11.2*  HCT 37.2* 35.4*  PLT 262 242   Recent Labs    02/08/20 0456 02/09/20 0515  NA 144 144  K 3.6 3.8  CL 103 105  CO2 27 28  GLUCOSE 76 98  BUN 16 16  CREATININE 0.80 0.73  CALCIUM 10.3 10.2    Intake/Output Summary (Last 24 hours) at 02/10/2020 0848 Last data filed at 02/10/2020 0100 Gross per 24 hour  Intake 500 ml  Output 885 ml  Net -385 ml    Physical Exam: Vital Signs Blood pressure 98/66, pulse (!) 120, temperature 98.6 F (37 C), resp. rate 20, height 5\' 9"  (1.753 m), weight 61.9 kg, SpO2 95 %. Gen: awake, sitting up in bed slightly- less dry appearing, but has lost more weight, somewhat confused, NAD HEENT: lips dry/mouth dry still Cardio: tachycardic- rate 110s-120s-   Chest: CTA B/L- no W/R/R- good air movement- wearing O2 by [place] 2L Abd: Soft, NT, ND, (+)BS -normoactive Ext: no clubbing, cyanosis, or edema- very tight in L>R upper traps- also bones more prominent -losing weight- becoming more obvious has lost more weight Psych: less urinable this AM Skin: Warm and dry.  Intact. Musc: No edema in extremities.  No tenderness in extremities. Neuro: somewhat confused again- no change Motor: Bilateral upper extremities: Grossly 5/5 proximal distal Bilateral lower extremities: Grossly 4+/5 proximal to distal, stable  Assessment/Plan: 1. Functional deficits which require 3+ hours per day of interdisciplinary therapy in a comprehensive inpatient rehab setting.  Physiatrist is providing close team supervision and 24 hour management of active medical problems  listed below.  Physiatrist and rehab team continue to assess barriers to discharge/monitor patient progress toward functional and medical goals  Care Tool:  Bathing    Body parts bathed by patient: Right arm,Left arm,Chest,Abdomen,Right upper leg,Left upper leg,Face,Front perineal area   Body parts bathed by helper: Buttocks,Right lower leg,Left lower leg Body parts n/a: Front perineal area,Buttocks,Right lower leg,Left lower leg   Bathing assist Assist Level: Minimal Assistance - Patient > 75%     Upper Body Dressing/Undressing Upper body dressing   What is the patient wearing?: Pull over shirt    Upper body assist Assist Level: Set up assist    Lower Body Dressing/Undressing Lower body dressing      What is the patient wearing?: Incontinence brief,Pants     Lower body assist Assist for lower body dressing: Moderate Assistance - Patient 50 - 74%     Toileting Toileting    Toileting assist Assist for toileting: Moderate Assistance - Patient 50 - 74% Assistive Device Comment: BSC   Transfers Chair/bed transfer  Transfers assist     Chair/bed transfer assist level: Minimal Assistance - Patient > 75% Chair/bed transfer assistive device: Museum/gallery exhibitions officer assist      Assist level: Contact Guard/Touching assist Assistive device: Walker-rolling Max distance: 55   Walk 10 feet activity   Assist     Assist level: Contact Guard/Touching assist Assistive device: Walker-rolling   Walk 50 feet activity   Assist Walk 50  feet with 2 turns activity did not occur: Safety/medical concerns  Assist level: Minimal Assistance - Patient > 75% Assistive device: Walker-rolling    Walk 150 feet activity   Assist Walk 150 feet activity did not occur: Safety/medical concerns         Walk 10 feet on uneven surface  activity   Assist Walk 10 feet on uneven surfaces activity did not occur: Safety/medical concerns          Wheelchair     Assist Will patient use wheelchair at discharge?: Yes Type of Wheelchair: Manual    Wheelchair assist level: Supervision/Verbal cueing Max wheelchair distance: 125'    Wheelchair 50 feet with 2 turns activity    Assist        Assist Level: Supervision/Verbal cueing   Wheelchair 150 feet activity     Assist      Assist Level: Moderate Assistance - Patient 50 - 74%   Blood pressure 98/66, pulse (!) 120, temperature 98.6 F (37 C), resp. rate 20, height 5\' 9"  (1.753 m), weight 61.9 kg, SpO2 95 %.  Medical Problem List and Plan: 1.  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  1/7- spoke to brother yesterday who said he'd pass on to daughter- sounds like he didn't- palliative care to see pt  1/10- palliative saw pt- is DNAR/DNI now  1/11- called Oncology on call- trying to figure out plan for treatment- was told 12/27 that would do radiation and then immediately do Chemo, however, due to dispo issues (family cannot take home) needs to go to SNF after radiation- spoke at length to Dr Burr Medico- she or Dr Julien Nordmann will see pt today/tomorrow.   1/12- Cannot do PET scan until d/c from hospital. Dr Tommy Medal to see pt Friday  Continue CIR  2.  Antithrombotics: -DVT/anticoagulation:   Continue Heparin.  Mechanical: Sequential compression devices, below knee Bilateral lower extremities   Dopplers negative for DVT  -antiplatelet therapy: N/A 3. Pain Management:    Decreased oxycodone to q4H prn.  DC gabapentin since causing constipation  Added Cymbalta 30 mg daily.   1/3- will increase Duloxetine to 60 mg nightly- for nerve pain  1/5- not taking pain meds often- encouraged  pt to take pain meds before therapy.   1/6- will reduce oxy to Norco q4 hours prn due to increased confusion per family (nursing hasn't seen it)  with pain meds  1/9 pt told me his pain is under control presently  1/10- will place lidoderm patches on shoulders/upper traps B/L where muscles REAL tight.   1/11- can't have lidoderm in >3 patches, but refused yesterday- will check into this.  1/12- saw after left room, pt refused ALL meds this AM-   1/13- no pain meds in 2-4 days- tramadol or Norco 1/15: d/c Norco since patient is not requiring  1/17- will d/c Cymbalta since won't be crushed- also restarted norco for when Tramadol not enough-   1/18- NPO this AM- but con't regimen  1/19- encourage pt to take pain meds if hurting- con't regimen 4. Mood: LCSW to follow for evaluation and support.              -antipsychotic agents: N/a  Discussed with nursing, he has been sleeping well.  5. Neuropsych: This patient is capable of making decisions on his own behalf.  Appreciate neuropsych 6. Skin/Wound Care: Routine pressure relief measures.  7. Fluids/Electrolytes/Nutrition: Monitor I/Os.             Added juven to promote healing.  8. Neurogenic bladder: Foley in place due to urinary retention/UTI, with plans to remove on Monday  Flomax daily initiated on 12/30, monitor blood pressure closely  Florinef 0.1 mg daily initiated to assist with low BP.   Improving on 1/2  1/3- will d/c foley this afternoon, after radiation  1/4- wasn't removed- will remove today- this AM and ordered I/o caths and PVRs since might not void.  1/5- so far, no voiding- cathing required 100%   1/6-9- no voiding- caths volumes 300-600c q6 hours  1/10- no voiding- in/out caths being done- pt doesn't want foley back.  1/11- no voiding still  1/12- no voiding- doing I/o caths- nursing doing- will need foley when goes to SNF   1/17- wait on placing foley for now- d/w family- but stop Flomax since cannot be crushed  1/18-  will need foley when goes to SNF- they will not accept with in/out caths 9.  Acute renal failure: Resolved  Creatinine 0.86 on 12/27, 0.78 on 1/14, repeat weekly.  10. Acute blood loss anemia:   Hemoglobin 12.4 on 12/28, 10.5 on 1/14, repeat weekly.  1/19- Hb 11.1- con't regimen   Continue to monitor 11. Metastatic Lung AdenoCA:  On dulera--has been on supplemental oxygen  1/4- didn't get radiation yesterday- is scheduled ot go back today.  1/6- Getting radiation daily M-F  1/13- Dr Julien Nordmann to see Friday- cannot get PET scan since inpt- explained to family.   12. Kleb/Proteus UTI: Completed course of Cipro 13. Resting tachycardia: 12/21 sinus tach RBBB on  EKG   1/12- cannot give B blocker due to low BP/orthostasis even though is a little tachycardic  1/19- is worse- in 120s- will check EKG! 14. Neurogenic bowel:   Responds to dig stim, added lidocaine jelly  Colace increased per pt request Colace to 200 mg daily, increased to 3 times daily   -add metamucil    1/18- had small BM last night- refused bowel program- not documented  1.19- bowel program last night- but no results.  14. Hypotension/tachycardia-   Severe orthostasis, negative on 12/31  Relatively controlled on 1/2  TEDs/abdominal binder for support.   1/5- added midodrine 5 mg TID-AC for low Bps  1/7- increased Midodrine to 10 mg TID- will also increase Florinef to help with orthostatic hypotension. It's really bad when goes to radiation  1/17- Pt refusing all meds including florinef and Midodrine- will back down doses slightly since hasn't taken in days- when restarts with Cortrak  1/18- decreased florinef to 0.1 mg and Midodrine to 5mg  TID with meals  1/19- BP 98/66 laying down- likely orthostatic- con't midodrine/florinef per PEG  15.  Sleep disturbance: Scheduled melatonin at nights.   Trazodone 100  1/3- sleeping well- con't regimen 17. Tenting/mild dehydration- will push 6 cups/water daily minimum  1/6- skin very  dry- order eucerin AND push PO fluids  1/19- BUN OK, but likely more dry than appears due to loss of muscle mass.  18. Hyperkalemia  Lokelma ordered on 12/23  Potassium 4.6 on 12/27 19. Pancoast tumor/ metastatic lung cancer:   Radiation to begin on 1/3 and to see Dr Julien Nordmann 1/3 at 2:15 pm  1/4-  didn't get radiation- will make sure scheduled to  go back today  1/5- made sure pt went to radiation -his BP was low- but gave him 500cc IVFs NS bolus to help with BP- BP was 82/50s.   1/7- Increased Midodrine and florienf to help with low BP.  20. Dysphagia per pt  12/30- ordered SLP eval and treat  1/3- placed on D3 thin diet  1/4- is better when sitting up at 90 degrees  1/6- will add Protonix for reflux Sx's.   1/8 MBS performed 1/7--mod oral/mild pharyng dysphagia   -D1, thins recommended by SLP, meds with puree  1/10-  Pt says doesn't help much with D1 thins.   1/12- per SLP, working on D2, but without teeth, cannot do D2 right now until passed trials  1/17- will place Cortrak and try to get PEG ASAP via general surgery- d/w family both and know if we don't do, he won't make it more than another few weeks.   1/18- supposed to get PEG today- with anesthesia  1/19- got PEG- getting nutrition and meds via PEG 21. Hypercalcemia  1/4- is down to 11 from 11.2- will monitor- if goes back up more, will treat. 22. Poor appetite  1/10- will try Marinol 2.5 mg BID  1/12- not working as of yet- placed nutrition consult since not taking supplements - "too sticky"  1/17- stopped due to increased confusion.  22. Depression  1/11- on Cymbalta but cannot add Wellbutrin- has allergy- will put small dose of Celexa 10 mg daily on board-so he doesn't have issues with serotonin syndrome.   1/12- think it's the reason pt refused meds this AM, encouraged pt to take his meds for low BP, depression, et  1/17- changed cymbalta to celex 20 mg since can be crushed.   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  1/13- want to make sure  doesn't have stroke- will check head CT asap. Also called Ophtho- they said call back when confusion better and will examine.  Until then, cannot.   1/17- head CT/ looks OK- no stroke- found out was refusing glaucoma meds 24. Confusion  1/13- could be marinol, since not receiving pain meds- will stop marinol 25. Severe malnutrition  1/13- has lost 16 lbs in last 3 weeks from 12/21- d/w family about a PEG-  And they agreed- cannot get from IR because hx of hernia repair with mesh- will call gen surgery.   1/17- getting PEG as soon as possible  1/19- got PEG_ getting nutrition 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 Diastolic dysfunction, grade 1: 1/14 ECHO reviewed       LOS: 29 days A FACE TO FACE EVALUATION WAS PERFORMED  Taz Vanness 02/10/2020, 8:48 AM

## 2020-02-11 ENCOUNTER — Inpatient Hospital Stay (HOSPITAL_COMMUNITY): Payer: Medicare Other | Admitting: Physical Therapy

## 2020-02-11 ENCOUNTER — Inpatient Hospital Stay (HOSPITAL_COMMUNITY): Payer: Medicare Other | Admitting: Speech Pathology

## 2020-02-11 ENCOUNTER — Ambulatory Visit
Admission: RE | Admit: 2020-02-11 | Discharge: 2020-02-11 | Disposition: A | Discharge status: Home / Self Care | Payer: Medicare Other | Source: Ambulatory Visit | Attending: Radiation Oncology | Admitting: Radiation Oncology

## 2020-02-11 ENCOUNTER — Inpatient Hospital Stay (HOSPITAL_COMMUNITY): Payer: Medicare Other | Admitting: Occupational Therapy

## 2020-02-11 DIAGNOSIS — R918 Other nonspecific abnormal finding of lung field: Secondary | ICD-10-CM | POA: Diagnosis not present

## 2020-02-11 MED ORDER — BISACODYL 10 MG RE SUPP: 10.0000 mg | Freq: Every day | RECTAL | 0 refills | Status: AC

## 2020-02-11 MED ORDER — IPRATROPIUM BROMIDE 0.06 % NA SOLN: 2.0000 | Freq: Three times a day (TID) | NASAL | 12 refills | Status: AC

## 2020-02-11 MED ORDER — PSYLLIUM 95 % PO PACK: 1.0000 | PACK | Freq: Every day | ORAL | Status: AC

## 2020-02-11 MED ORDER — TRAMADOL HCL 50 MG PO TABS: 50.0000 mg | ORAL_TABLET | Freq: Four times a day (QID) | ORAL | 0 refills | Status: DC | PRN

## 2020-02-11 MED ORDER — CYCLOBENZAPRINE HCL 10 MG PO TABS: 10.0000 mg | ORAL_TABLET | Freq: Three times a day (TID) | ORAL | 0 refills | Status: AC | PRN

## 2020-02-11 MED ORDER — PANTOPRAZOLE SODIUM 40 MG PO TBEC: 40.0000 mg | DELAYED_RELEASE_TABLET | Freq: Every day | ORAL | Status: DC

## 2020-02-11 MED ORDER — LIDOCAINE 5 % EX PTCH: 3.0000 | MEDICATED_PATCH | Freq: Every day | CUTANEOUS | 0 refills | Status: AC

## 2020-02-11 MED ORDER — MIDODRINE HCL 5 MG PO TABS: 5.0000 mg | ORAL_TABLET | Freq: Three times a day (TID) | ORAL | Status: DC

## 2020-02-11 MED ORDER — SENNA 8.6 MG PO TABS: 2.0000 | ORAL_TABLET | Freq: Every day | ORAL | 0 refills | Status: DC

## 2020-02-11 MED ORDER — PROSOURCE TF PO LIQD: 45.0000 mL | Freq: Two times a day (BID) | ORAL | Status: AC

## 2020-02-11 MED ORDER — DOCUSATE SODIUM 50 MG/5ML PO LIQD
100.0000 mg | Freq: Three times a day (TID) | ORAL | 0 refills | Status: AC
Start: 2020-02-11 — End: ?

## 2020-02-11 MED ORDER — HYDROCERIN EX CREA: 1.0000 "application " | TOPICAL_CREAM | Freq: Two times a day (BID) | CUTANEOUS | 0 refills | Status: AC

## 2020-02-11 MED ORDER — FLEET ENEMA 7-19 GM/118ML RE ENEM: 1.0000 | ENEMA | Freq: Every day | RECTAL | Status: DC | PRN

## 2020-02-11 MED ORDER — CITALOPRAM HYDROBROMIDE 20 MG PO TABS: 20.0000 mg | ORAL_TABLET | Freq: Every day | ORAL | Status: DC

## 2020-02-11 MED ORDER — ADULT MULTIVITAMIN W/MINERALS CH: 1.0000 | ORAL_TABLET | Freq: Every day | ORAL | Status: AC

## 2020-02-11 MED ORDER — DOCUSATE SODIUM 50 MG/5ML PO LIQD
100.0000 mg | Freq: Three times a day (TID) | ORAL | Status: DC
  Administered 2020-02-11 – 2020-02-15 (×12): 100 mg
  Filled 2020-02-11 (×13): qty 10

## 2020-02-11 MED ORDER — LIDOCAINE HCL URETHRAL/MUCOSAL 2 % EX GEL: 1.0000 "application " | Freq: Every day | CUTANEOUS | Status: AC

## 2020-02-11 MED ORDER — FREE WATER: 200.0000 mL | Freq: Three times a day (TID) | Status: AC

## 2020-02-11 MED ORDER — TRAZODONE HCL 100 MG PO TABS: 100.0000 mg | ORAL_TABLET | Freq: Every day | ORAL | Status: DC

## 2020-02-11 MED ORDER — FLUDROCORTISONE ACETATE 0.1 MG PO TABS: 0.1000 mg | ORAL_TABLET | Freq: Every day | ORAL | Status: AC

## 2020-02-11 MED ORDER — HYDROCODONE-ACETAMINOPHEN 5-325 MG PO TABS: 1.0000 | ORAL_TABLET | Freq: Two times a day (BID) | ORAL | 0 refills | Status: DC | PRN

## 2020-02-11 MED ORDER — HEPARIN SODIUM (PORCINE) 5000 UNIT/ML IJ SOLN: 5000.0000 [IU] | Freq: Three times a day (TID) | INTRAMUSCULAR | Status: AC

## 2020-02-11 MED ORDER — MELATONIN 3 MG PO TABS: 3.0000 mg | ORAL_TABLET | Freq: Every day | ORAL | 0 refills | Status: AC

## 2020-02-11 MED ORDER — ASPIRIN 81 MG PO CHEW: 81.0000 mg | CHEWABLE_TABLET | Freq: Every day | ORAL | Status: DC

## 2020-02-11 MED ORDER — SENNA 8.6 MG PO TABS
2.0000 | ORAL_TABLET | Freq: Every day | ORAL | Status: DC
  Administered 2020-02-11 – 2020-02-12 (×2): 17.2 mg via ORAL
  Filled 2020-02-11 (×2): qty 2

## 2020-02-11 NOTE — Progress Notes (Signed)
Speech Language Pathology Daily Session Note  Patient Details  Name: Gregory Bautista MRN: [id number] Date of Birth: [birth year]  Today's Date: 02/11/2020 SLP Individual Time: 1200-1230 SLP Individual Time Calculation (min): 30 min  Short Term Goals: Week 3: SLP Short Term Goal 1 (Week 3): STG=LTG due to short ELOS  Skilled Therapeutic Interventions:   Patient seen for skilled ST session focusing on swallow function goals. Patient appears somewhat depressed/dejected and would frequently give responses such as, "it makes no difference", "I could eat or not eat". He does feel that PEG feedings are helping him and he said he doesn't feel overly full but not starving either.He took a few sips of thin liquids (tea) and appeared to struggle somewhat with initiating a swallow. He told SLP that sometimes he has difficulty swallowing, and other times he does better. Patient did exhibit intermittent throat clearing, however this did not appear related to small amount of PO's he consumed (liquids only). He declined solids and said that things have been tasting different. He is hopeful that he will improve to return to baseline functioning. Patient continues to benefit from skilled SLP intervention but anticipate discharge soon due to patient refusing majority of PO's.   Pain Pain Assessment Pain Scale: Faces Pain Score: 7  Faces Pain Scale: Hurts a little bit Pain Type: Acute pain Pain Location:  (generalized) Pain Descriptors / Indicators: Aching Pain Frequency: Intermittent Pain Onset: On-going Patients Stated Pain Goal: 3 Pain Intervention(s): Medication (See eMAR) (norco given)  Therapy/Group: Individual Therapy  Sonia Baller, MA, CCC-SLP Speech Therapy

## 2020-02-11 NOTE — Progress Notes (Signed)
[place] PHYSICAL MEDICINE & REHABILITATION PROGRESS NOTE   Subjective/Complaints:   Pt reports having pain in L ribs still- not eating.  Hasn't taken any meds yet today- Says hasn't asked for any.   Said did bowel program last night- did NOT -refused bowel program as well as all cath yesterday.   Did have a loose/liquidy stool, due to TFs.     ROS:  Limited by cognition  Objective:   No results found. Recent Labs    02/09/20 0515  WBC 5.5  HGB 11.2*  HCT 35.4*  PLT 242   Recent Labs    02/09/20 0515  NA 144  K 3.8  CL 105  CO2 28  GLUCOSE 98  BUN 16  CREATININE 0.73  CALCIUM 10.2    Intake/Output Summary (Last 24 hours) at 02/11/2020 1127 Last data filed at 02/10/2020 2225 Gross per 24 hour  Intake 670 ml  Output 600 ml  Net 70 ml    Physical Exam: Vital Signs Blood pressure (!) 109/58, pulse (!) 115, temperature 99.1 F (37.3 C), temperature source Oral, resp. rate 19, height 5\' 9"  (1.753 m), weight 61.9 kg, SpO2 100 %. Gen: awake, but focusing on "inside"- not surroundings, O2 via [place], NAD HEENT: lips dry/mouth dry still Cardio: tachycardic- rate 110-120s.   Chest: CTA B/L- no W/R/R- good air movement- O2 by [place] 2L Abd: Soft, NT, ND, (+)BS -normoactive Ext: no clubbing, cyanosis, or edema- very tight in L>R upper traps- also bones more prominent -losing weight- becoming more obvious has lost more weight Psych: focusing inwards Skin: Warm and dry.  Intact. Musc: No edema in extremities.  No tenderness in extremities. Neuro: somewhat confused again- no change Motor: Bilateral upper extremities: Grossly 5/5 proximal distal Bilateral lower extremities: Grossly 4+/5 proximal to distal, stable  Assessment/Plan: 1. Functional deficits which require 3+ hours per day of interdisciplinary therapy in a comprehensive inpatient rehab setting.  Physiatrist is providing close team supervision and 24 hour management of active medical problems listed  below.  Physiatrist and rehab team continue to assess barriers to discharge/monitor patient progress toward functional and medical goals  Care Tool:  Bathing    Body parts bathed by patient: Right arm,Left arm,Chest,Abdomen,Right upper leg,Left upper leg,Face,Front perineal area   Body parts bathed by helper: Buttocks,Right lower leg,Left lower leg Body parts n/a: Front perineal area,Buttocks,Right lower leg,Left lower leg   Bathing assist Assist Level: Minimal Assistance - Patient > 75%     Upper Body Dressing/Undressing Upper body dressing   What is the patient wearing?: Pull over shirt    Upper body assist Assist Level: Set up assist    Lower Body Dressing/Undressing Lower body dressing      What is the patient wearing?: Incontinence brief,Pants     Lower body assist Assist for lower body dressing: Moderate Assistance - Patient 50 - 74%     Toileting Toileting    Toileting assist Assist for toileting: Moderate Assistance - Patient 50 - 74% Assistive Device Comment: BSC   Transfers Chair/bed transfer  Transfers assist     Chair/bed transfer assist level: Minimal Assistance - Patient > 75% Chair/bed transfer assistive device: Therapist, sports   Ambulation assist      Assist level: Contact Guard/Touching assist Assistive device: Walker-rolling Max distance: 55   Walk 10 feet activity   Assist     Assist level: Contact Guard/Touching assist Assistive device: Walker-rolling   Walk 50 feet activity   Assist Walk 50 feet  with 2 turns activity did not occur: Safety/medical concerns  Assist level: Minimal Assistance - Patient > 75% Assistive device: Walker-rolling    Walk 150 feet activity   Assist Walk 150 feet activity did not occur: Safety/medical concerns         Walk 10 feet on uneven surface  activity   Assist Walk 10 feet on uneven surfaces activity did not occur: Safety/medical concerns          Wheelchair     Assist Will patient use wheelchair at discharge?: Yes Type of Wheelchair: Manual    Wheelchair assist level: Supervision/Verbal cueing Max wheelchair distance: 23'    Wheelchair 50 feet with 2 turns activity    Assist        Assist Level: Supervision/Verbal cueing   Wheelchair 150 feet activity     Assist      Assist Level: Moderate Assistance - Patient 50 - 74%   Blood pressure (!) 109/58, pulse (!) 115, temperature 99.1 F (37.3 C), temperature source Oral, resp. rate 19, height 5\' 9"  (1.753 m), weight 61.9 kg, SpO2 100 %.  Medical Problem List and Plan: 1.  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  1/7- spoke to brother yesterday who said he'd pass on to daughter- sounds like he didn't- palliative care to see pt  1/10- palliative saw pt- is DNAR/DNI now  1/11- called Oncology on call- trying to figure out plan for treatment- was told 12/27 that would do radiation and then immediately do Chemo, however, due to dispo issues (family cannot take home) needs to go to SNF after radiation- spoke at length to Dr Burr Medico- she or Dr Julien Nordmann will see pt today/tomorrow.   1/12- Cannot do PET scan until d/c from hospital. Dr Tommy Medal to see pt Friday  Continue CIR  2.  Antithrombotics: -DVT/anticoagulation:   Continue Heparin.  Mechanical: Sequential compression devices, below knee Bilateral lower extremities   Dopplers negative for DVT  -antiplatelet therapy: N/A 3. Pain Management:    Decreased oxycodone to q4H prn.  DC gabapentin since causing constipation  Added Cymbalta 30 mg daily.   1/3- will increase Duloxetine to 60 mg nightly- for nerve pain  1/5- not  taking pain meds often- encouraged pt to take pain meds before therapy.   1/6- will reduce oxy to Norco q4 hours prn due to increased confusion per family (nursing hasn't seen it)  with pain meds  1/9 pt told me his pain is under control presently  1/10- will place lidoderm patches on shoulders/upper traps B/L where muscles REAL tight.   1/11- can't have lidoderm in >3 patches, but refused yesterday- will check into this.  1/12- saw after left room, pt refused ALL meds this AM-   1/13- no pain meds in 2-4 days- tramadol or Norco 1/15: d/c Norco since patient is not requiring  1/17- will d/c Cymbalta since won't be crushed- also restarted norco for when Tramadol not enough-   1/18- NPO this AM- but con't regimen  1/20 encourage pt to take pain meds if hurting- con't regimen 4. Mood: LCSW to follow for evaluation and support.              -  antipsychotic agents: N/a  Discussed with nursing, he has been sleeping well.  5. Neuropsych: This patient is capable of making decisions on his own behalf.  Appreciate neuropsych 6. Skin/Wound Care: Routine pressure relief measures.  7. Fluids/Electrolytes/Nutrition: Monitor I/Os.             Added juven to promote healing.  8. Neurogenic bladder: Foley in place due to urinary retention/UTI, with plans to remove on Monday  Flomax daily initiated on 12/30, monitor blood pressure closely  Florinef 0.1 mg daily initiated to assist with low BP.   Improving on 1/2  1/3- will d/c foley this afternoon, after radiation  1/4- wasn't removed- will remove today- this AM and ordered I/o caths and PVRs since might not void.  1/5- so far, no voiding- cathing required 100%   1/6-9- no voiding- caths volumes 300-600c q6 hours  1/10- no voiding- in/out caths being done- pt doesn't want foley back.  1/11- no voiding still  1/12- no voiding- doing I/o caths- nursing doing- will need foley when goes to SNF   1/17- wait on placing foley for now- d/w family- but stop  Flomax since cannot be crushed  1/18- will need foley when goes to SNF- they will not accept with in/out caths 9.  Acute renal failure: Resolved  Creatinine 0.86 on 12/27, 0.78 on 1/14, repeat weekly.  10. Acute blood loss anemia:   Hemoglobin 12.4 on 12/28, 10.5 on 1/14, repeat weekly.  1/19- Hb 11.1- con't regimen   Continue to monitor 11. Metastatic Lung AdenoCA:  On dulera--has been on supplemental oxygen  1/4- didn't get radiation yesterday- is scheduled ot go back today.  1/6- Getting radiation daily M-F  1/13- Dr Julien Nordmann to see Friday- cannot get PET scan since inpt- explained to family.   12. Kleb/Proteus UTI: Completed course of Cipro 13. Resting tachycardia: 12/21 sinus tach RBBB on  EKG   1/12- cannot give B blocker due to low BP/orthostasis even though is a little tachycardic  1/19- is worse- in 120s- will check EKG!  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  14. Hypotension/tachycardia-   Severe orthostasis, negative on 12/31  Relatively controlled on 1/2  TEDs/abdominal binder for support.   1/5- added midodrine 5 mg TID-AC for low Bps  1/7- increased Midodrine to 10 mg TID- will also increase Florinef to help with orthostatic hypotension. It's really bad when goes to radiation  1/17- Pt refusing all meds including florinef and Midodrine- will back down doses slightly since hasn't taken in days- when restarts with Cortrak  1/18- decreased florinef to 0.1 mg and Midodrine to 5mg  TID with meals  1/19- BP 98/66 laying down- likely orthostatic- con't midodrine/florinef per PEG  15.  Sleep disturbance: Scheduled melatonin at nights.   Trazodone 100  1/3- sleeping well- con't  regimen 17. Tenting/mild dehydration- will push 6 cups/water daily minimum  1/6- skin very dry- order eucerin AND push PO fluids  1/19- BUN OK, but likely more dry than appears due to loss of muscle mass.  18. Hyperkalemia  Lokelma ordered on 12/23  Potassium 4.6 on 12/27 19. Pancoast tumor/ metastatic lung cancer:   Radiation to  begin on 1/3 and to see Dr Julien Nordmann 1/3 at 2:15 pm  1/4- didn't get radiation- will make sure scheduled to  go back today  1/5- made sure pt went to radiation -his BP was low- but gave him 500cc IVFs NS bolus to help with BP- BP was 82/50s.   1/7- Increased Midodrine and florienf to help with low BP.  20. Dysphagia per pt  12/30- ordered SLP eval and treat  1/3- placed on D3 thin diet  1/4- is better when sitting up at 90 degrees  1/6- will add Protonix for reflux Sx's.   1/8 MBS performed 1/7--mod oral/mild pharyng dysphagia   -D1, thins recommended by SLP, meds with puree  1/10-  Pt says doesn't help much with D1 thins.   1/12- per SLP, working on D2, but without teeth, cannot do D2 right now until passed trials  1/17- will place Cortrak and try to get PEG ASAP via general surgery- d/w family both and know if we don't do, he won't make it more than another few weeks.   1/18- supposed to get PEG today- with anesthesia  1/19- got PEG- getting nutrition and meds via PEG 21. Hypercalcemia  1/4- is down to 11 from 11.2- will monitor- if goes back up more, will treat. 22. Poor appetite  1/10- will try Marinol 2.5 mg BID  1/12- not working as of yet- placed nutrition consult since not taking supplements - "too sticky"  1/17- stopped due to increased confusion.  22. Depression  1/11- on Cymbalta but cannot add Wellbutrin- has allergy- will put small dose of Celexa 10 mg daily on board-so he doesn't have issues with serotonin syndrome.   1/12- think it's the reason pt refused meds this AM, encouraged pt to take his meds for low BP, depression, et  1/17- changed  cymbalta to celex 20 mg since can be crushed.   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  1/13- want to make sure doesn't have stroke- will check head CT asap. Also called Ophtho- they said call back when confusion better and will examine.  Until then, cannot.   1/17- head CT/ looks OK- no stroke- found out was refusing glaucoma meds 24. Confusion  1/13- could be marinol, since not receiving pain meds- will stop marinol 25. Severe malnutrition  1/13- has lost 16 lbs in last 3 weeks from 12/21- d/w family about a PEG-  And they agreed- cannot get from IR because hx of hernia repair with mesh- will call gen surgery.   1/17- getting PEG as soon as possible  1/19- got PEG_ getting nutrition 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 Diastolic dysfunction, grade 1: 1/14 ECHO reviewed    Discussed with family today- >40 minutes today- >50% coordination of care- about pt turning inwards, refusing basically all care, and my concern that hospice might be better choice in the long run- after discussion, will finish Radiation tomorrow, but they will d/w each other about whether or not to do chemo- also will place foley since refusing caths- and offer therapy, but if he refuses "it's OK per them".      LOS: 30 days A FACE TO FACE EVALUATION WAS PERFORMED  Naava Janeway 02/11/2020, 11:27 AM

## 2020-02-11 NOTE — Progress Notes (Addendum)
Patient ID: Gregory Bautista, male   DOB: [birth year], 80 y.o.   MRN: [id number]  SW spoke with [name] Charles/Admissions with [facility] [phone]) who reported willing to extend bed offer. SW informed will speak with pt and will follow-up. *SW spoke with pt, and conference called his dtr [name] and brother [name] to inform on bed offer from [place], and still unsure on if there will be a bed offer with [facility]. Family willing to move forward with bed offer; SW explained insurance [name] is pending. SW to begin insurance auth. SW left message for [name] informing on above and will update once updates from insurance.   SW spoke with [facility] [phone]) to submit SNF authorization beginning on today. Updated clinicals requested. Ref ID #[id number].  *SW faxed clinicals.   SW received updates from NP Tacey Ruiz requesting that pt have outpatient palliative in the event he does not progress well.   SW spoke with MaryAnne/[facility] [phone]) to discuss outpatient palliative. Will follow patient, and will discuss pt being seen once pt d/c location determined. SW to follow-up.  [name], MSW, [place] Office: [phone] Cell: [phone] Fax: [phone]

## 2020-02-11 NOTE — Progress Notes (Signed)
Occupational Therapy Session Note  Patient Details  Name: Gregory Bautista MRN: [id number] Date of Birth: January 05, 1941  Today's Date: 02/11/2020 OT Individual Time: 0830-0900 OT Individual Time Calculation (min): 30 min    Short Term Goals: Week 1:  OT Short Term Goal 1 (Week 1): Pt will be able to stand from EOB to Rw with CGA to prepare for LB self care. OT Short Term Goal 1 - Progress (Week 1): Progressing toward goal OT Short Term Goal 2 (Week 1): Pt will transfer to toilet with min A. OT Short Term Goal 2 - Progress (Week 1): Progressing toward goal OT Short Term Goal 3 (Week 1): Pt will complete toileting with min A. OT Short Term Goal 3 - Progress (Week 1): Progressing toward goal OT Short Term Goal 4 (Week 1): Pt will don pants with min a. OT Short Term Goal 4 - Progress (Week 1): Progressing toward goal OT Short Term Goal 5 (Week 1): Pt will be able to tolerate standing for 5 min with RW with CGA. OT Short Term Goal 5 - Progress (Week 1): Progressing toward goal Week 2:  OT Short Term Goal 1 (Week 2): Pt will be able to stand from EOB to Rw with CGA to prepare for LB self care. OT Short Term Goal 1 - Progress (Week 2): Met OT Short Term Goal 2 (Week 2): Pt will transfer to toilet with min A. OT Short Term Goal 2 - Progress (Week 2): Met OT Short Term Goal 3 (Week 2): Pt will complete toileting with min A. OT Short Term Goal 3 - Progress (Week 2): Met OT Short Term Goal 4 (Week 2): Pt will don pants with min a. OT Short Term Goal 4 - Progress (Week 2): Progressing toward goal OT Short Term Goal 5 (Week 2): Pt will be able to tolerate standing for 5 min with RW with CGA. OT Short Term Goal 5 - Progress (Week 2): Met  Skilled Therapeutic Interventions/Progress Updates:    1:1 Finishing up with nursing when arrived. Donned TEDS and abdominal binder with total A. Pt able to get to the EOB with supervision with extra time. Pt ambulated to the bathroom with RW with min  A with a very  narrow base of support with cues for feet placement. Pt had become incontinent of bowel and was unaware. Pt finished voided on the toilet and required total A for hygiene for thoroughness. Pt ambulated to the recliner again with min A with A for steering the RW and management of the RW with turns. O2 on 3 liters during functional activity.   Left with setup of meal to try to eat in sitting up position.     Therapy Documentation Precautions:  Precautions Precautions: Back,Fall Precaution Comments: reviewed precautions Restrictions Weight Bearing Restrictions: No Pain: No c/o pain in session   Therapy/Group: Individual Therapy  Willeen [facility] 02/11/2020, 12:08 PM

## 2020-02-11 NOTE — Discharge Summary (Signed)
Physician Discharge Summary  Patient ID: Gregory Bautista MRN: [id number] DOB/AGE: 02-01-1940 80 y.o.  Admit date: 01/12/2020 Discharge date: 02/15/2020  Discharge Diagnoses:  Principal Problem:   Thoracic myelopathy Active Problems:   Mass of upper lobe of left lung   Neurogenic bladder   Sleep disturbance   Postoperative pain   Drug-induced constipation   Neurogenic bowel   Protein-calorie malnutrition, severe   Anorexia   Dysphagia   Constipation   Discharged Condition: stable.  Significant Diagnostic Studies: CT ABDOMEN WO CONTRAST  Result Date: 02/05/2020 CLINICAL DATA:  Evaluate gastric anatomy prior to potential percutaneous gastrostomy placement. EXAM: CT ABDOMEN WITHOUT CONTRAST TECHNIQUE: Multidetector CT imaging of the abdomen was performed following the standard protocol without IV contrast. COMPARISON:  Chest CT-12/04/2019 FINDINGS: The lack of intravenous contrast limits the ability to evaluate solid abdominal organs. Lower chest: Limited visualization of the lower thorax demonstrates minimal dependent subpleural ground-glass atelectasis. No discrete focal airspace opacities. No pleural effusion Normal heart size. No pericardial effusion. There is mild decreased attenuation of the intra cardiac blood pool suggestive of anemia. Hepatobiliary: Normal hepatic contour. Normal noncontrast appearance of the gallbladder given degree of distention. No radiopaque gallstones. No ascites. Pancreas: Normal noncontrast appearance of the pancreas. Spleen: Normal noncontrast appearance of the spleen. Adrenals/Urinary Tract: There is a large (approximately 8.5 x 7.9 x 7.6 cm peripherally calcified mixed attenuating mass arising from the anterior aspect of the interpolar aspect of the right kidney (axial image 31, series 3; coronal image 50, series 6). Additional punctate (sub 1.1 cm) hypoattenuating renal lesions are seen bilaterally, incompletely evaluated though favored to represent renal  cysts. Note is made of an approximately 0.8 x 0.6 cm nonobstructing stone within the right renal pelvis (axial image 25, series 3; coronal image 62, series 6). There is a punctate (approximately 2 mm) nonobstructing stone within the anterior interpolar aspect the left kidney (axial image 25, series 3). No definitive renal stones are seen along the imaged course of the superior aspect of the bilateral ureters. Very minimal amount of symmetric bilateral perinephric stranding. No evidence of urinary obstruction. There is mild thickening of the lateral limb of the left adrenal gland without discrete nodule. Normal noncontrast appearance of the right adrenal gland. The urinary bladder was not imaged. Stomach/Bowel: Postoperative change of the GE junction as well as the anterior aspect of the gastric antrum potentially the sequela of previous gastrojejunostomy creation though a distal anastomosis was not imaged. Ingested enteric contrast is seen within the colon. No evidence of enteric obstruction. The anterior wall of the gastric antrum is otherwise well apposed against the ventral wall of the upper abdomen without interposition either the colon or liver (axial image 20, series 3). No pneumoperitoneum, pneumatosis or portal venous gas. Vascular/Lymphatic: Atherosclerotic plaque within a normal caliber abdominal aorta. No bulky retroperitoneal or mesenteric lymphadenopathy on this noncontrast examination. Other: Dermal calcification overlies the ventral aspect of the upper abdomen. Regional soft tissues appear otherwise normal. Musculoskeletal: No acute or aggressive osseous abnormalities. Mild-to-moderate multilevel lumbar spine DDD, worse at L3-L4 and L4-L5 with disc space height loss, endplate irregularity and small posteriorly directed disc osteophyte complexes at these locations. IMPRESSION: 1. Postoperative change of the GE junction as well as the anterior aspect of the gastric antrum which may represent the sequela  of previous gastrojejunostomy. Correlation with previous operative reports is advised as if the patient is post gastrojejunostomy creation he would NOT be a candidate for percutaneous gastrostomy tube placement. If operative reports are  not available, further evaluation with upper GI series could be performed as indicated. 2. Gastric anatomy otherwise amenable to percutaneous gastrostomy tube placement as indicated. 3. Incidentally noted approximately 8.5 cm mixed attenuating mass arising from the anterior aspect of the interpolar aspect of the right kidney - while potentially representative of a complex renal cyst, further evaluation with contrast-enhanced abdominal MRI is advised. 4. Nonobstructing bilateral nephrolithiasis. 5. Aortic Atherosclerosis (ICD10-I70.0). Electronically Signed   By: Sandi Mariscal M.D.   On: 02/05/2020 08:09   CT HEAD WO CONTRAST  Result Date: 02/04/2020 CLINICAL DATA:  Acute loss of vision right eye. History of lung carcinoma EXAM: CT HEAD WITHOUT CONTRAST TECHNIQUE: Contiguous axial images were obtained from the base of the skull through the vertex without intravenous contrast. COMPARISON:  None. FINDINGS: Brain: There is mild age related volume loss. There is no intracranial mass, hemorrhage, extra-axial fluid collection, or midline shift. Decreased attenuation is noted in the anterior limb of each external capsule, likely due to prior lacunar type infarcts in these areas. No acute appearing infarct is evident. Vascular: No hyperdense vessel. There is calcification in each carotid siphon region. Skull: Bony calvarium appears intact. Sinuses/Orbits: There is mucosal thickening in several ethmoid air cells. Other visualized paranasal sinuses are clear. Orbits appear symmetric bilaterally. Other: Mastoid air cells are clear. 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 No acute appearing infarct is evident. No mass or hemorrhage. There are foci  of arterial vascular calcification. There is mucosal thickening in several ethmoid air cells. Electronically Signed   By: Lowella Grip III M.D.   On: 02/04/2020 12:54   MR BRAIN W WO CONTRAST  Result Date: 02/05/2020 CLINICAL DATA:  Stroke suspected.  Vision loss. EXAM: MRI HEAD WITHOUT AND WITH CONTRAST TECHNIQUE: Multiplanar, multiecho pulse sequences of the brain and surrounding structures were obtained without and with intravenous contrast. CONTRAST:  6.69m GADAVIST GADOBUTROL 1 MMOL/ML IV SOLN COMPARISON:  CT head from the same day FINDINGS: Brain: No acute infarction, hemorrhage, hydrocephalus, extra-axial collection or mass lesion. 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 No abnormal enhancement. Vascular: Major arterial flow voids are maintained at the skull base. Skull and upper cervical spine: Normal marrow signal. Right frontal scalp lipoma. Sinuses/Orbits: No substantial paranasal sinus disease. Unremarkable orbits. Other: No mastoid effusions. IMPRESSION: No acute abnormality.  Specifically, no acute infarct. Electronically Signed   By: FMargaretha SheffieldMD   On: 02/05/2020 20:29   MR ABDOMEN W WO CONTRAST  Result Date: 02/08/2020 CLINICAL DATA:  Right renal mass on CT.  Left-sided lung cancer. EXAM: MRI ABDOMEN WITHOUT AND WITH CONTRAST TECHNIQUE: Multiplanar multisequence MR imaging of the abdomen was performed both before and after the administration of intravenous contrast. CONTRAST:  6.526mGADAVIST GADOBUTROL 1 MMOL/ML IV SOLN COMPARISON:  Abdominal CT 02/04/2020. FINDINGS: Portions of exam are mildly motion degraded. Lower chest: Normal heart size without pericardial or pleural effusion. Hepatobiliary: Normal liver. Normal gallbladder, without biliary ductal dilatation. Pancreas: The pancreatic duct is upper normal for age. No mass or acute inflammation. Spleen:  Normal in size, without focal  abnormality. Adrenals/Urinary Tract: Left greater than right adrenal thickening. Bilateral simple renal cysts. Corresponding to the CT abnormality, within the anterior lower pole right kidney 7.5 x 7.5 by 7.7 cm mass which demonstrates precontrast T2 hypointensity and T1 hyperintensity. No post-contrast enhancement, including on subtracted images. No hydronephrosis. Stomach/Bowel: Normal stomach and small bowel. Colonic stool burden  suggests constipation. Vascular/Lymphatic: Aortic atherosclerosis. No retroperitoneal or retrocrural adenopathy. Other:  No ascites. Musculoskeletal: No acute osseous abnormality. IMPRESSION: 1. Mildly motion degraded exam. 2. Dominant lower pole right renal mass is consistent with a hemorrhagic/proteinaceous cyst. 3. No acute process or evidence of metastatic disease in the abdomen. 4.  Aortic Atherosclerosis (ICD10-I70.0). Electronically Signed   By: Abigail Miyamoto M.D.   On: 02/08/2020 13:47   DG UGI W SINGLE CM (SOL OR THIN BA)  Result Date: 02/05/2020 CLINICAL DATA:  Abnormal CT. EXAM: WATER SOLUBLE UPPER GI SERIES TECHNIQUE: Single-column upper GI series was performed using water soluble contrast. CONTRAST:  59m OMNIPAQUE IOHEXOL 300 MG/ML  SOLN COMPARISON:  CT scan 02/04/2020. FLUOROSCOPY TIME:  Fluoroscopy Time:  2 minutes and 48 seconds. Radiation Exposure Index (if provided by the fluoroscopic device): 14.2 mGy Number of Acquired Spot Images: 0 FINDINGS: Study is markedly limited by difficulty with patient positioning and piecemeal swallowing. Within this limitation, there is no gross esophageal abnormality. Surgical clips in the region of the esophagogastric junction or probably related to prior fundoplication. Contrast moves freely from the esophagus into the stomach although there is poor esophageal peristalsis. Fluoro table was pushes in 30 degrees head up in the patient was rotated in both obliques, but the gastrojejunostomy seen on CT scan earlier today is anterior and  the Roux limb could not be opacified given limited stomach filling and inability to position the patient upright. IMPRESSION: 1. No evidence for obstruction at the gastroesophageal junction. 2. 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 Repeat upper GI series could be performed after patient recovers from acute symptoms (preferably as an outpatient) and is better able to participate with drinking contrast and upright positioning. Electronically Signed   By: EMisty StanleyM.D.   On: 02/05/2020 13:56   DG Swallowing Func-Speech Pathology  Result Date: 01/29/2020 Objective Swallowing Evaluation: Type of Study: MBS-Modified Barium Swallow Study  Patient Details Name: HKarim AielloMRN: [id number] of Birth: [id number]'s Date: 01/29/2020 Time: No data recorded-No data recorded No data recorded Past Medical History: Past Medical History: Diagnosis Date . Allergic rhinitis   NEC . Asthma  . Glaucoma  . Lung cancer, main bronchus ([facility] 12/2019 . Tobacco use disorder 09/24/2019 Past Surgical History: Past Surgical History: Procedure Laterality Date . COLON SURGERY   . EYE SURGERY Right  . LAMINECTOMY WITH POSTERIOR LATERAL ARTHRODESIS LEVEL 4 N/A 12/30/2019  Procedure: THORACIC THREE LAMINECTOMY, THORACIC INSTRUMENTATION AND FUSION THORACIC ONE-FIVE;  Surgeon: JNewman Pies MD;  [facility]  Service: Neurosurgery;  Laterality: N/A; HPI: see H&P  No data recorded Assessment / Plan / Recommendation CHL IP CLINICAL IMPRESSIONS 01/29/2020 Clinical Impression Patient presents with a moderate oral and mild pharyngeal dysphagia. Oral phase of swallow consists of delayed anterior to posterior transit of all boluses (heavier boluses leading to longer delays), decreased bolus cohesion and piecemeal swallowing with mechanical soft solids, decreased mastication of mechanical soft solids. Pharyngeal phase consisted of minimal instances of delays  of swallow initiation to vallecular sinus with thin liquids, mild amount of pyriform residuals and trace to mild vallecular sinus residuals with thin liquids,mechanical soft solids. 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 No penetration or aspiration events observed. SLP Visit Diagnosis Dysphagia, oropharyngeal phase (R13.12) Attention and concentration deficit following -- Frontal lobe and executive function deficit following -- Impact on safety and function No limitations  No flowsheet data found.  No flowsheet data found. CHL IP DIET RECOMMENDATION 01/29/2020 SLP Diet Recommendations Dysphagia 1 (Puree) solids;Thin liquid Liquid Administration via Cup;Straw Medication Administration Whole meds with puree Compensations Minimize environmental distractions;Slow rate;Small sips/bites;Follow solids with liquid Postural Changes Remain semi-upright after after feeds/meals (Comment)   CHL IP OTHER RECOMMENDATIONS 01/29/2020 Recommended Consults -- Oral Care Recommendations Oral care BID Other Recommendations --   CHL IP FOLLOW UP RECOMMENDATIONS 01/29/2020 Follow up Recommendations None   No flowsheet data found.     CHL IP ORAL PHASE 01/29/2020 Oral Phase Impaired Oral - Pudding Teaspoon -- Oral - Pudding Cup -- Oral - Honey Teaspoon -- Oral - Honey Cup -- Oral - Nectar Teaspoon -- Oral - Nectar Cup -- Oral - Nectar Straw -- Oral - Thin Teaspoon -- Oral - Thin Cup Reduced posterior propulsion;Delayed oral transit Oral - Thin Straw -- Oral - Puree Piecemeal swallowing;Reduced posterior propulsion;Delayed oral transit Oral - Mech Soft Impaired mastication;Weak lingual manipulation;Piecemeal swallowing;Delayed oral transit;Reduced posterior propulsion;Holding of bolus Oral - Regular -- Oral - Multi-Consistency -- Oral - Pill Decreased bolus cohesion;Reduced posterior propulsion;Weak lingual manipulation;Delayed oral transit Oral Phase - Comment --  CHL IP PHARYNGEAL  PHASE 01/29/2020 Pharyngeal Phase Impaired Pharyngeal- Pudding Teaspoon -- Pharyngeal -- Pharyngeal- Pudding Cup -- Pharyngeal -- Pharyngeal- Honey Teaspoon -- Pharyngeal -- Pharyngeal- Honey Cup -- Pharyngeal -- Pharyngeal- Nectar Teaspoon -- Pharyngeal -- Pharyngeal- Nectar Cup -- Pharyngeal -- Pharyngeal- Nectar Straw -- Pharyngeal -- Pharyngeal- Thin Teaspoon -- Pharyngeal -- Pharyngeal- Thin Cup Delayed swallow initiation-vallecula;Pharyngeal residue - pyriform Pharyngeal -- Pharyngeal- Thin Straw Delayed swallow initiation-vallecula Pharyngeal -- Pharyngeal- Puree WFL Pharyngeal -- Pharyngeal- Mechanical Soft Pharyngeal residue - valleculae Pharyngeal -- Pharyngeal- Regular -- Pharyngeal -- Pharyngeal- Multi-consistency -- Pharyngeal -- Pharyngeal- Pill WFL Pharyngeal -- Pharyngeal Comment --  CHL IP CERVICAL ESOPHAGEAL PHASE 01/29/2020 Cervical Esophageal Phase Impaired Pudding Teaspoon -- Pudding Cup -- Honey Teaspoon -- Honey Cup -- Nectar Teaspoon -- Nectar Cup -- Nectar Straw -- Thin Teaspoon -- Thin Cup -- Thin Straw -- Puree Prominent cricopharyngeal segment Mechanical Soft Prominent cricopharyngeal segment Regular -- Multi-consistency -- Pill -- Cervical Esophageal Comment -- Sonia Baller, MA, CCC-SLP Speech Therapy             ECHOCARDIOGRAM COMPLETE  Result Date: 02/06/2020    ECHOCARDIOGRAM REPORT   Patient Name:   Dagoberto Reef Date of Exam: 02/06/2020 Medical Rec #:  [id number]         Height:       69.0 in Accession #:    [id number]        Weight:       136.5 lb Date of Birth:  July 31, 1940         BSA:          1.756 m Patient Age:    22 years          BP:           99/68 mmHg Patient Gender: M                 HR:           110 bpm. Exam Location:  Inpatient Procedure: 2D Echo Indications:   stroke 434.91  History:       Patient has no prior history of Echocardiogram examinations.                Cancer.  Sonographer:   Johny Chess Referring      [phone]  JINDONG XU Phys: IMPRESSIONS  1.  Left ventricular ejection fraction, by estimation, is 55 to 60%. The left ventricle has normal function. The left ventricle has no regional wall motion abnormalities. Left ventricular diastolic parameters are consistent with Grade I diastolic dysfunction (impaired relaxation).  2. Right ventricular systolic function is normal. The right ventricular size is normal.  3. The mitral valve is normal in structure. No evidence of mitral valve regurgitation. No evidence of mitral stenosis.  4. The aortic valve is tricuspid. Aortic valve regurgitation is not visualized. Mild to moderate aortic valve sclerosis/calcification is present, without any evidence of aortic stenosis.  5. The inferior vena cava is normal in size with greater than 50% respiratory variability, suggesting right atrial pressure of 3 mmHg. FINDINGS  Left Ventricle: Left ventricular ejection fraction, by estimation, is 55 to 60%. The left ventricle has normal function. The left ventricle has no regional wall motion abnormalities. The left ventricular internal cavity size was normal in size. There is  no left ventricular hypertrophy. Left ventricular diastolic parameters are consistent with Grade I diastolic dysfunction (impaired relaxation). Right Ventricle: The right ventricular size is normal. No increase in right ventricular wall thickness. Right ventricular systolic function is normal. Left Atrium: Left atrial size was normal in size. Right Atrium: Right atrial size was normal in size. Pericardium: There is no evidence of pericardial effusion. Mitral Valve: The mitral valve is normal in structure. No evidence of mitral valve regurgitation. No evidence of mitral valve stenosis. Tricuspid Valve: The tricuspid valve is normal in structure. Tricuspid valve regurgitation is mild . No evidence of tricuspid stenosis. Aortic Valve: The aortic valve is tricuspid. Aortic valve regurgitation is not visualized. Mild to moderate aortic valve sclerosis/calcification  is present, without any evidence of aortic stenosis. Pulmonic Valve: The pulmonic valve was normal in structure. Pulmonic valve regurgitation is not visualized. No evidence of pulmonic stenosis. Aorta: The aortic root is normal in size and structure. Venous: The inferior vena cava is normal in size with greater than 50% respiratory variability, suggesting right atrial pressure of 3 mmHg. IAS/Shunts: No atrial level shunt detected by color flow Doppler.  LEFT VENTRICLE PLAX 2D LVIDd:         3.80 cm  Diastology LVIDs:         2.50 cm  LV e' medial:    5.98 cm/s LV PW:         0.90 cm  LV E/e' medial:  10.1 LV IVS:        0.70 cm  LV e' lateral:   7.94 cm/s LVOT diam:     2.00 cm  LV E/e' lateral: 7.6 LV SV:         45 LV SV Index:   26 LVOT Area:     3.14 cm  RIGHT VENTRICLE             IVC RV S prime:     13.60 cm/s  IVC diam: 1.20 cm TAPSE (M-mode): 2.1 cm LEFT ATRIUM           Index      RIGHT ATRIUM          Index LA diam:      2.40 cm 1.37 cm/m RA Area:     6.41 cm LA Vol (A4C): 15.9 ml 9.05 ml/m RA Volume:   9.99 ml  5.69 ml/m  AORTIC VALVE LVOT Vmax:   79.60 cm/s LVOT Vmean:  55.200 cm/s LVOT VTI:    0.144 m  AORTA Ao Root diam: 3.10 cm MV E velocity: 60.40 cm/s MV A velocity: 96.40 cm/s  SHUNTS MV E/A ratio:  0.63        Systemic VTI:  0.14 m                            Systemic Diam: 2.00 cm Jenkins Rouge MD Electronically signed by Jenkins Rouge MD Signature Date/Time: 02/06/2020/12:03:31 PM    Final    CT ANGIO HEAD CODE STROKE  Result Date: 02/05/2020 CLINICAL DATA:  Neuro deficits, subacute. EXAM: CT ANGIOGRAPHY HEAD AND NECK TECHNIQUE: Multidetector CT imaging of the head and neck was performed using the standard protocol during bolus administration of intravenous contrast. Multiplanar CT image reconstructions and MIPs were obtained to evaluate the vascular anatomy. Carotid stenosis measurements (when applicable) are obtained utilizing NASCET criteria, using the distal internal carotid diameter as  the denominator. CONTRAST:  61m OMNIPAQUE IOHEXOL 350 MG/ML SOLN COMPARISON:  CT head February 04, 2019.  Same day MRI. FINDINGS: CT HEAD FINDINGS Brain: No evidence of acute large vascular territory infarction, hemorrhage, hydrocephalus, extra-axial collection or mass lesion/mass effect. 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 Vascular: Calcific atherosclerosis. Skull: No acute fracture. Sinuses: Sinuses are largely clear.  Unremarkable orbits. Orbits: No mastoid effusions. Review of the MIP images confirms the above findings CTA NECK FINDINGS Aortic arch: Atherosclerosis of the aorta. The right subclavian artery origin is not imaged. Otherwise, great vessel origins appear patent. 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 Right carotid system: No evidence of dissection, stenosis (50% or greater) or occlusion. Left carotid system: No evidence of dissection, stenosis (50% or greater) or occlusion. Mild narrowing of the common carotid artery origin. Mild atherosclerosis at the carotid bifurcation. The proximal common carotid artery abuts the anterior aspect of the left lung apex mass. Vertebral arteries: Left dominant. 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 Otherwise, no significant stenosis in the neck. Skeleton: Partially imaged thoracic fusion hardware. There is extensive invasion of the partially imaged upper thoracic spine by the left upper lung mass. Other neck: Normal thyroid. Upper chest: Large mass involving the left lung apex, which invades into the adjacent thoracic spine and mediastinum. The size and overall extent of the tumor appears progressed since prior CT chest from December 04, 2019. Review of the MIP images confirms the above findings CTA HEAD FINDINGS Anterior circulation: No significant proximal stenosis, large vessel occlusion,  aneurysm, or vascular malformation. Bilateral calcific cavernous carotid artery atherosclerosis without evidence of greater than 50% narrowing. Posterior circulation: No significant proximal stenosis, large vessel occlusion, aneurysm, or vascular malformation. The non dominant right vertebral artery makes a small contribution to the basilar artery. Venous sinuses: As permitted by contrast timing, patent. Review of the MIP images confirms the above findings IMPRESSION: 1. No emergent large vessel occlusion. 2. 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 3. 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 The size and overall extent of the tumor appears progressed since prior CT chest from December 04, 2019. CT of the chest with contrast could further characterize lung/mediastinal progression and MRI of the thoracic spine with contrast could further characterize thoracic (including canal) invasion if clinically indicated.  Electronically Signed   By: Margaretha Sheffield MD   On: 02/05/2020 21:34   CT ANGIO NECK CODE STROKE  Result Date: 02/05/2020 CLINICAL DATA:  Neuro deficits, subacute. EXAM: CT ANGIOGRAPHY HEAD AND NECK TECHNIQUE: Multidetector CT imaging of the head and neck was performed using the standard protocol during bolus administration of intravenous contrast. Multiplanar CT image reconstructions and MIPs were obtained to evaluate the vascular anatomy. Carotid stenosis measurements (when applicable) are obtained utilizing NASCET criteria, using the distal internal carotid diameter as the denominator. CONTRAST:  15m OMNIPAQUE IOHEXOL 350 MG/ML SOLN COMPARISON:  CT head February 04, 2019.  Same day MRI. FINDINGS: CT HEAD FINDINGS Brain: No evidence of acute large vascular territory infarction, hemorrhage, hydrocephalus, extra-axial collection or mass lesion/mass effect.  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 Vascular: Calcific atherosclerosis. Skull: No acute fracture. Sinuses: Sinuses are largely clear.  Unremarkable orbits. Orbits: No mastoid effusions. Review of the MIP images confirms the above findings CTA NECK FINDINGS Aortic arch: Atherosclerosis of the aorta. The right subclavian artery origin is not imaged. Otherwise, great vessel origins appear patent. 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 Right carotid system: No evidence of dissection, stenosis (50% or greater) or occlusion. Left carotid system: No evidence of dissection, stenosis (50% or greater) or occlusion. Mild narrowing of the common carotid artery origin. Mild atherosclerosis at the carotid bifurcation. The proximal common carotid artery abuts the anterior aspect of the left lung apex mass. Vertebral arteries: Left dominant. 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 Otherwise, no significant stenosis in the neck. Skeleton: Partially imaged thoracic fusion hardware. There is extensive invasion of the partially imaged upper thoracic spine by the left upper lung mass. Other neck: Normal thyroid. Upper chest: Large mass involving the left lung apex, which invades into the adjacent thoracic spine and mediastinum. The size and overall extent of the tumor appears progressed since prior CT chest from December 04, 2019. Review of the MIP images confirms the above findings CTA HEAD FINDINGS Anterior circulation: No significant proximal stenosis, large vessel occlusion, aneurysm, or vascular malformation. Bilateral calcific cavernous carotid artery atherosclerosis without evidence of greater than 50% narrowing. Posterior circulation: No significant proximal stenosis, large vessel occlusion, aneurysm, or vascular malformation. The non dominant right  vertebral artery makes a small contribution to the basilar artery. Venous sinuses: As permitted by contrast timing, patent. Review of the MIP images confirms the above findings IMPRESSION: 1. No emergent large vessel occlusion. 2. 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 3. 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 The size and overall extent of the tumor appears progressed since prior CT chest from December 04, 2019. CT of the chest with contrast could further characterize lung/mediastinal progression and MRI of the thoracic spine with contrast could further characterize thoracic (including canal) invasion if clinically indicated. Electronically Signed   By: FMargaretha SheffieldMD   On: 02/05/2020 21:34    Labs:  Basic Metabolic Panel: BMP Latest Ref Rng & Units 02/09/2020 02/08/2020 02/05/2020  Glucose 70 - 99 mg/dL 98 76 131(H)  BUN 8 - 23 mg/dL 16 16 18   Creatinine 0.61 - 1.24 mg/dL 0.73 0.80 0.78  Sodium 135 - 145 mmol/L 144 144 141  Potassium 3.5 -  5.1 mmol/L 3.8 3.6 3.7  Chloride 98 - 111 mmol/L 105 103 102  CO2 22 - 32 mmol/L 28 27 29   Calcium 8.9 - 10.3 mg/dL 10.2 10.3 10.3    Liver function panel.  Hepatic Function Latest Ref Rng & Units 02/05/2020 02/04/2020 01/29/2020  Total Protein 6.5 - 8.1 g/dL - 7.5 7.1  Albumin 3.5 - 5.0 g/dL 2.2(L) 2.4(L) 2.3(L)  AST 15 - 41 U/L - 22 20  ALT 0 - 44 U/L - 27 30  Alk Phosphatase 38 - 126 U/L - 59 63  Total Bilirubin 0.3 - 1.2 mg/dL - 1.0 0.6    CBC: CBC Latest Ref Rng & Units 02/15/2020 02/15/2020 02/09/2020  WBC 4.0 - 10.5 K/uL 4.7 3.6(L) 5.5  Hemoglobin 13.0 - 17.0 g/dL 9.6(L) 8.5(L) 11.2(L)  Hematocrit 39.0 - 52.0 % 30.6(L) 26.8(L) 35.4(L)  Platelets 150 - 400 K/uL 238 199 242    CBG: No results for input(s): GLUCAP in the last 168 hours.  Brief HPI:   Rodrigues Urbanek is a 80 y.o. male with  history of smoking, 22-monthhistory of left chest wall pain progressing to BLE weakness with difficulty walking as well as difficulty voiding.  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  Tumor was not felt to be resectable due to advanced disease and spine stabilization recommended with plans for chemo XRT in the future.  He was evaluated by Dr. JArnoldo Moraleand admitted on 12/30/2019 for T2-T4 laminectomy for tumor resection with T1-T5 posterior arthrodesis with pedicle screws and rods.  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  He has also been set up to follow-up with Dr. MJulien Nordmannon outpatient basis.  Hospital course was significant for issues with acute renal failure, overflow incontinence with bladder volumes up to 1250 mL, failed voiding trial requiring Foley repeat placement as well as hypoxia with activity.  He was found to have Proteus and Klebsiella UTI and started on Cipro for treatment.  Patient continued to have issues with resting tachycardia due to dehydration, BLE weakness with instability and left chest wall pain affecting overall mobility and ADLs.  CIR was recommended due to functional decline.   Hospital Course: HSavva Beamerwas admitted to rehab 01/12/2020 for inpatient therapies to consist of PT, ST and OT at least three hours five days a week. Past admission physiatrist, therapy team and rehab RN have worked together to provide customized collaborative inpatient rehab.  He was maintained on Cipro for 5 days to complete treatment for UTIs.  Flomax was changed to suppertime to prevent orthostatic symptoms.  Abdominal binder and teds also added to help with BP support.  Bowel program was started with use of oral laxatives in the morning and Dulcolax suppository  after supper.  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 Foley was placed to SD on 01/22 and he will need this for the forseeable future due to neurogenic bladder.   Blood pressures were monitored on TID basis and continued to be low therefore abdominal binder, teds midodrine and Florinef has been used for BP support.   He continues to have resting tachycardia occasionally up to 120s and EKG  done showed sinus tach.  He has had issues with sleep-wake disruption requiring melatonin as well as trazodone and Seroquel to help with sleep hygiene.  He underwent simulation with initiation of XRT to thoracic spine as well as left lung mass on 12/27 and completed course of 4 treatments by 02/11/20. PET scan has had to be reschedule due to hospitalized sate and his appointment with Dr. Julien Nordmann has been rescheduled for January 26 at 1 PM.   Patient has had issues with depressed mood and Cymbalta was added for mood and changed to Celexa for mood stabilization.   Pain is controlled with use of lidocaine patch as well as Tylenol  (Norco was d/c due to confusion).  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  Reflux precautions added without improvement therefore MBS done on 01/07 showing moderate oral and mild pharyngeal dysphagia therefore diet was downgraded to dysphagia 1 thin liquids. He continued to have issues with poor p.o. intake and Marinol was added for appetite stimulation however patient started developing confusion with hallucination so this was discontinued.    On 01/13, patient reported decreased vision in right eye as well as worsening of confusion.  Stroke work-up was completed with MRI brain negative for  acute changes or metastatic disease.  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  Upper endoscopy performed with placement of PEG tube on 01/18 by Dr. Georganna Skeans . Tube feeds initiated at slow rate to prevent refeeding syndrome. Oral intake remains poor and he is currently tolerating tube feeds without difficulty.     BLE dopplers done past admission were negative for DVT. Transient hyperkalemia has resolved and serial check of lytes show that Lytes/renal status WNL. Serial CBC showed that H/H was stable until 1/24 when it was seen to drop to 9.6 question hemodiluted with TF now on board. PEG site C/D intact without abdominal pain.  No signs of bleeding noted and recommend monitor for now with repeat CBC in 2-3 days to monitor for stability.  His progress has been slow and limited with depressed mood. Celexa and Cymbalta were added for mood stabilization.  Family is unable to provide care needed and has elected on SNF with palliative care to follow for support.    Rehab course: During patient's stay in rehab weekly team conferences were held to monitor patient's progress, set goals and discuss barriers to discharge. At admission, patient required max assist with ADL tasks and mod assist with mobility.  Speech therapy was added for dysphagia treatment. He has had improvement in activity tolerance, balance, postural control as well as ability to compensate for deficits. He requires min to mod assist with ADL tasks. He requires min assist for transfers and mobility. He requires supervision with meals and is tolerating dysphagia 1, thins without signs or symptoms of aspiration.   Disposition: [place].   Diet: Dysphgia 1, thin liquids--needs supervision at meals.    Special  Instructions: 1. Perform foley care BID. 2. Dulcolax suppository after supper daily as part of bowel program.  3. Keep PEG site clean and dry.  4. Needs abdominal binder,TEDs and ACE wraps BLE for BP support when out  of bed.  5. Repeat CBC in 2-3 days to monitor for stability.   Discharge Instructions    Ambulatory referral to Physical Medicine Rehab   Complete by: As directed    4-6 weeks follow up with Dr. Dagoberto Ligas     Allergies as of 02/15/2020      Reactions   Penicillins Anaphylaxis, Other (See Comments)   Pt does not recall this reaction 2021*Tolerated Cephalexin* Has patient had a PCN reaction causing immediate rash, facial/tongue/throat swelling, SOB or lightheadedness with hypotension: Yes Has patient had a PCN reaction causing severe rash involving mucus membranes or skin necrosis: No Has patient had a PCN reaction that required hospitalization No Has patient had a PCN reaction occurring within the last 10 years: Yes If all of the above answers are "NO", then may proceed with Cephalosporin use   Wellbutrin [bupropion] Other (See Comments)   Patient disputes this in 2021 Seizures   Morphine And Related Itching, Other (See Comments)   Patient disputes this in 2021      Medication List    STOP taking these medications   aspirin EC 81 MG tablet Replaced by: aspirin 81 MG chewable tablet   cholecalciferol 25 MCG (1000 UNIT) tablet Commonly known as: VITAMIN D3   ciprofloxacin 500 MG tablet Commonly known as: CIPRO   docusate sodium 100 MG capsule Commonly known as: COLACE Replaced by: docusate 50 MG/5ML liquid   gabapentin 100 MG capsule Commonly known as: NEURONTIN   oxyCODONE 5 MG immediate release tablet Commonly known as: Oxy IR/ROXICODONE   polyethylene glycol 17 g packet Commonly known as: MIRALAX / GLYCOLAX   senna-docusate 8.6-50 MG tablet Commonly known as: Senokot-S   tamsulosin 0.4 MG Caps capsule Commonly known as: FLOMAX     TAKE these  medications   acetaminophen 325 MG tablet Commonly known as: TYLENOL Place 1-2 tablets (325-650 mg total) into feeding tube every 4 (four) hours as needed for mild pain.   albuterol 108 (90 Base) MCG/ACT inhaler Commonly known as: VENTOLIN HFA Inhale 2 puffs into the lungs every 6 (six) hours as needed for wheezing or shortness of breath.   aspirin 81 MG chewable tablet Place 1 tablet (81 mg total) into feeding tube daily. Replaces: aspirin EC 81 MG tablet   bisacodyl 10 MG suppository Commonly known as: DULCOLAX Place 1 suppository (10 mg total) rectally daily after supper.   citalopram 20 MG tablet Commonly known as: CELEXA Place 1 tablet (20 mg total) into feeding tube daily.   cyclobenzaprine 10 MG tablet Commonly known as: FLEXERIL Take 1 tablet (10 mg total) by mouth 3 (three) times daily as needed for muscle spasms. What changed: Another medication with the same name was added. Make sure you understand how and when to take each.   cyclobenzaprine 10 MG tablet Commonly known as: FLEXERIL Place 1 tablet (10 mg total) into feeding tube 3 (three) times daily as needed for muscle spasms. What changed: You were already taking a medication with the same name, and this prescription was added. Make sure you understand how and when to take each.   diclofenac Sodium 1 % Gel Commonly known as: VOLTAREN Apply 1 application topically 4 (four) times daily as needed (pain).   docusate 50 MG/5ML liquid Commonly known as: COLACE Place 10 mLs (100 mg total) into feeding tube 3 (three) times daily. Replaces: docusate sodium 100 MG capsule   feeding supplement (PROSource TF) liquid Place 45 mLs into feeding tube 2 (two) times daily.  feeding supplement (OSMOLITE 1.5 CAL) Liqd Place 1,000 mLs into feeding tube continuous.   fludrocortisone 0.1 MG tablet Commonly known as: FLORINEF Place 1 tablet (0.1 mg total) into feeding tube daily.   free water Soln Place 200 mLs into feeding  tube 4 (four) times daily - after meals and at bedtime.   heparin 5000 UNIT/ML injection Inject 1 mL (5,000 Units total) into the skin every 8 (eight) hours.   hydrocerin Crea Apply 1 application topically 2 (two) times daily.   HYDROcodone-acetaminophen 5-325 MG tablet--Rx#6 pills Commonly known as: NORCO/VICODIN Place 1 tablet into feeding tube every 6 (six) hours as needed for severe pain (when tramadol doesn't work).   ipratropium 0.06 % nasal spray Commonly known as: ATROVENT Place 2 sprays into both nostrils 3 (three) times daily. What changed:   when to take this  reasons to take this   lidocaine 2 % jelly Commonly known as: XYLOCAINE 1 application by Other route daily.   lidocaine 5 % Commonly known as: LIDODERM Place 3 patches onto the skin daily. Remove & Discard patch within 12 hours or as directed by MD   melatonin 3 MG Tabs tablet Take 1 tablet (3 mg total) by mouth at bedtime.   melatonin 3 MG Tabs tablet Place 1 tablet (3 mg total) into feeding tube at bedtime.   midodrine 5 MG tablet Commonly known as: PROAMATINE Place 1 tablet (5 mg total) into feeding tube 3 (three) times daily with meals.   multivitamin with minerals Tabs tablet Take 1 tablet by mouth daily. What changed: Another medication with the same name was added. Make sure you understand how and when to take each.   multivitamin with minerals Tabs tablet Place 1 tablet into feeding tube daily. What changed: You were already taking a medication with the same name, and this prescription was added. Make sure you understand how and when to take each.   pantoprazole sodium 40 mg/20 mL Pack Commonly known as: PROTONIX Place 20 mLs (40 mg total) into feeding tube daily.   prochlorperazine 10 MG/2ML injection Commonly known as: COMPAZINE Inject 1-2 mLs (5-10 mg total) into the muscle every 6 (six) hours as needed.   prochlorperazine 25 MG suppository Commonly known as: COMPAZINE Place 0.5  suppositories (12.5 mg total) rectally every 6 (six) hours as needed for nausea.   psyllium 95 % Pack Commonly known as: HYDROCIL/METAMUCIL Take 1 packet by mouth daily.   QUEtiapine 25 MG tablet Commonly known as: SEROQUEL Place 1 tablet (25 mg total) into feeding tube at bedtime as needed (agitation/insomnia).   senna 8.6 MG Tabs tablet Commonly known as: SENOKOT Place 2 tablets (17.2 mg total) into feeding tube daily.   Symbicort 160-4.5 MCG/ACT inhaler Generic drug: budesonide-formoterol Inhale 1 puff into the lungs 2 (two) times daily as needed (shortness of breath).   timolol 0.5 % ophthalmic solution Commonly known as: TIMOPTIC Place 1 drop into both eyes 2 (two) times daily.   traMADol 50 MG tablet--Rx# 10 pills Commonly known as: ULTRAM Place 1 tablet (50 mg total) into feeding tube 4 (four) times daily as needed for moderate pain.   Travoprost (BAK Free) 0.004 % Soln ophthalmic solution Commonly known as: TRAVATAN Place 1 drop into both eyes at bedtime.   traZODone 100 MG tablet Commonly known as: DESYREL Place 1 tablet (100 mg total) into feeding tube at bedtime.       Follow-up Information    Lovorn, Jinny Blossom, MD Follow up.   Specialty: Physical  Medicine and Rehabilitation Why: office will call you with follow up appointment Contact information: [address] [place] [address] [phone]        Newman Pies, MD. Call.   Specialty: Neurosurgery Why: for post op appointment Contact information: [address] [place] [address] [phone]        Gery Pray, MD Follow up.   Specialty: Radiation Oncology Contact information: [place] [address] [phone]        Sur[facility], [place]. Call.   Specialty: General Surgery Why: Call and schedule follow up as needed for PEG tube. PEG must remain in place a minimum of 6-8 weeks.  Contact information: [address] [place] [address] [phone]        Heilingoetter, Cassandra L, PA-C Follow up on 02/17/2020.   Specialty: Physician Assistant Why: Be there at 12:40 pm for lab/ Appointment with provider at 1 pm.  Contact information: [place] [address] [phone]               Signed: Bary Leriche 02/15/2020, 2:35 PM

## 2020-02-11 NOTE — Progress Notes (Signed)
Pt agreeable to bowel program. Soft stool in rectum upon assessment. Unable to manually remove due to c/o pain and discomfort despite use of lidocaine gel. Dulcolax suppository inserted at 1726. No result until end of shift.   Gerald Stabs, RN

## 2020-02-11 NOTE — Progress Notes (Signed)
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 Tumor was not felt to be resectable due to advanced disease and spine stabilization recommended with plans for chemo and XRT in the future. He was evaluated by Dr. JArnoldo Moraleand was admitted on 12/30/2019 for T2-T4 laminectomy for tumor resection with T1-T5 posterior arthrodesis with pedicle screws and rods. 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 Plans are for XRT by Dr. KRanda Ngowith reports to of simulation set for 12/27. Had been set for follow up with Dr. MEarlie Serveron outpatient basis.   Palliative care was asked to get involved in the setting of metastatic lung cancer and the patient no longer wishing to continue treatments.  Today's Discussion (02/11/2020): Chart reviewed.  Alerted by my colleague, LMickel Baasthat we had been requested to meet with Herbertr and speak to his daughter, LCecille Rubinabout his poor overall condition to determine what the goals are moving forward.  Since HTerencewas last seen it appears that he had had ongoing failure to thrive and received a G-tube placement.  His total weight loss over the past 3 weeks is upwards of 16 pounds.  Per conversations with nursing staff patient has not progressed tremendously over the last week and a half specifically.  There is concerned that he will continue to decline upon discharge.  I met Adair at bedside this  afternoon.  He had just gotten back from radiation therapy and shares with me that this was his "last treatment".  I asked him to tell me why he was not wanting to participate in care inclusive of receiving medications.  HRaiquanshares that he does not remember not cooperating.  He vocalizes that he takes medications when they are needed and does not see any problem with that.  As Herber what his understanding of his additional cancer treatments will be and he shares with me that he is not sure though that this is an ongoing conversation with his daughter.  Asked [name] we could call LCecille Rubinon a speaker phone so that we may all talk at once about how he is doing.  LCecille Rubinshares with me that she spoke with Dr. LDagoberto Ligasthis morning at length and she realizes the severity of the present situation.  She shares that she plans to come down on Monday which is to the day that her work is due to be discharged.  She expresses that she is not in a position where she would want to make any decisions until she gets to see HIrelandand spend time with him.  I again expressed that if HVeroncontinues to decline and lack and physical improvement that hospice is the absolute best option for him given all he has been through to mitigate any additional undue suffering.  Discussed the importance of continued conversation with family and their  medical providers regarding overall plan of care and treatment options, ensuring decisions are within the  context of the patients values and GOCs.  I reached out to case management to verify that [name] is set up with outpatient palliative care upon discharge.  The goal would be for them to continue with goals of care conversations in the setting of likely poor clinical outcomes.  Objective Assessment: Vital Signs Vitals:   02/11/20 0755 02/11/20 1219  BP: (!) 109/58 97/68  Pulse: (!) 115 (!) 107  Resp: 19 19  Temp: 99.1 F (37.3 C) 97.6 F (36.4 C)  SpO2: 100% 100%     Intake/Output Summary (Last 24 hours) at 02/11/2020 1608 Last data filed at 02/11/2020 1343 Gross per 24 hour  Intake 540 ml  Output 925 ml  Net -385 ml   Last Weight  Most recent update: 02/06/2020  7:32 AM   Weight  61.9 kg (136 lb 7.4 oz)           Gen:  Older AA M in NAD HEENT: moist mucous membranes CV: Regular rate and rhythm  PULM: clear to auscultation bilaterally  ABD: soft/nontender  EXT: No edema  Neuro: Alert and oriented x3-4  SUMMARY OF RECOMMENDATIONS DNAR/DNI  MOST Completed, paper copy placed onto the chart electric copy can be found in [facility]  DNR Form Completed, paper copy placed onto the chart electric copy can be found in [facility]  Outpatient Palliative Support ordered - Again, if patient continue to neglect to make great improvements strongly recommend hospice consideration.  Both [name] and his daughter [name] are aware of this recommendation.  Spiritual support   Time Spent: 35 Greater than 50% of the time was spent in counseling and coordination of care ______________________________________________________________________________________ [facility] Team Team Cell Phone: [phone] Please utilize secure chat with additional questions, if there is no response within 30 minutes please call the above phone number  Palliative Medicine Team providers are available by phone from 7am to 7pm daily and can be reached through the team cell phone.  Should this patient require assistance outside of these hours, please call the patient's attending physician.

## 2020-02-11 NOTE — Progress Notes (Signed)
Physical Therapy Session Note  Patient Details  Name: Gregory Bautista MRN: [id number] Date of Birth: 04/09/1940  Today's Date: 02/11/2020 PT Individual Time: 1008-1105 PT Individual Time Calculation (min): 57 min   Short Term Goals: Week 5:  PT Short Term Goal 1 (Week 5): =LTG due to ELOS  Skilled Therapeutic Interventions/Progress Updates: Pt presented in recliner agreeable to therapy. Pt initially tangental in conversation with PTA encouraging pt to eat and pt discussing things need to "air dry". Pt took one bite of grits then refused any remaining food. BP checked prior to mobility 95/57 (68) HR 106. PTA threaded pants for time management and pt performed STS and pulled up pants with CGA then performed stand pivot transfer to w/c CGA. When asked pt denied any dizziness or lightheadedness, but expressed that he felt "hot" BP checked 81/57 (66) HR 114. Pt performed ankle pumps, and LAQ 2 x 10 bilaterally then transported to day room for energy conservation. Performed stand pivot to [name] with RW and CGA. Participated in [name] L3 x 8 min for general conditioning averaging between 30-40 SPM. BP checked after [name] 96/62 (73) HR 107. Pt then ambulated ~52f to w/c with RW and CGA. Pt noted to have more adducted gait this session nearing scissoring gait. Pt was able to correct with verbal cues however decreased carryover and pt would revert back to narrow BOS after several steps. Pt denies any s/s hypotension BP assessed once seated 80/56 (65) HR 114. Pt transported back to room and performed stand pivot to return to chair with CGA overall. Pt left in recliner at end of session with belt alarm on, call bell within reach and needs met.      Therapy Documentation Precautions:  Precautions Precautions: Back,Fall Precaution Comments: reviewed precautions Restrictions Weight Bearing Restrictions: No General:   Vital Signs: Therapy Vitals Temp: 97.6 F (36.4 C) Temp Source: Oral Pulse Rate:  (!) 107 Resp: 19 BP: 97/68 Patient Position (if appropriate): Sitting Oxygen Therapy SpO2: 100 % O2 Device: Nasal Cannula O2 Flow Rate (L/min): 2.5 L/min Pain: Pain Assessment Pain Scale: 0-10 Pain Score: 7  Pain Type: Acute pain Pain Location: Generalized Pain Descriptors / Indicators: Aching Pain Frequency: Intermittent Pain Onset: On-going Patients Stated Pain Goal: 3 Pain Intervention(s): Medication (See eMAR) (norco given)  Therapy/Group: Individual Therapy  Aryani Daffern  Charlestine Rookstool, PTA  02/11/2020, 1:00 PM

## 2020-02-12 ENCOUNTER — Inpatient Hospital Stay (HOSPITAL_COMMUNITY): Payer: Medicare Other | Admitting: Physical Therapy

## 2020-02-12 ENCOUNTER — Inpatient Hospital Stay (HOSPITAL_COMMUNITY): Payer: Medicare Other | Admitting: Speech Pathology

## 2020-02-12 ENCOUNTER — Inpatient Hospital Stay (HOSPITAL_COMMUNITY): Payer: Medicare Other | Admitting: Occupational Therapy

## 2020-02-12 ENCOUNTER — Ambulatory Visit: Payer: Medicare Other

## 2020-02-12 DIAGNOSIS — R1319 Other dysphagia: Secondary | ICD-10-CM

## 2020-02-12 DIAGNOSIS — R630 Anorexia: Secondary | ICD-10-CM

## 2020-02-12 MED ORDER — MIDODRINE HCL 5 MG PO TABS
5.0000 mg | ORAL_TABLET | Freq: Three times a day (TID) | ORAL | Status: DC
  Administered 2020-02-12 – 2020-02-15 (×11): 5 mg
  Filled 2020-02-12 (×11): qty 1

## 2020-02-12 MED ORDER — TRAMADOL HCL 50 MG PO TABS
50.0000 mg | ORAL_TABLET | Freq: Four times a day (QID) | ORAL | Status: DC | PRN
  Administered 2020-02-12 – 2020-02-15 (×6): 50 mg
  Filled 2020-02-12 (×6): qty 1

## 2020-02-12 MED ORDER — PANTOPRAZOLE SODIUM 40 MG PO PACK
40.0000 mg | PACK | Freq: Every day | ORAL | Status: DC
  Administered 2020-02-12 – 2020-02-15 (×4): 40 mg
  Filled 2020-02-12 (×4): qty 20

## 2020-02-12 MED ORDER — CITALOPRAM HYDROBROMIDE 10 MG PO TABS
20.0000 mg | ORAL_TABLET | Freq: Every day | ORAL | Status: DC
  Administered 2020-02-13 – 2020-02-15 (×3): 20 mg
  Filled 2020-02-12 (×3): qty 2

## 2020-02-12 MED ORDER — TRAZODONE HCL 50 MG PO TABS
100.0000 mg | ORAL_TABLET | Freq: Every day | ORAL | Status: DC
  Administered 2020-02-12 – 2020-02-14 (×3): 100 mg
  Filled 2020-02-12 (×3): qty 2

## 2020-02-12 MED ORDER — GUAIFENESIN-DM 100-10 MG/5ML PO SYRP: 5.0000 mL | ORAL_SOLUTION | Freq: Four times a day (QID) | ORAL | Status: DC | PRN

## 2020-02-12 MED ORDER — QUETIAPINE FUMARATE 25 MG PO TABS: 25.0000 mg | ORAL_TABLET | Freq: Every evening | ORAL | Status: DC | PRN

## 2020-02-12 MED ORDER — SENNA 8.6 MG PO TABS
2.0000 | ORAL_TABLET | Freq: Every day | ORAL | Status: DC
  Administered 2020-02-13 – 2020-02-15 (×3): 17.2 mg
  Filled 2020-02-12 (×3): qty 2

## 2020-02-12 MED ORDER — ASPIRIN 81 MG PO CHEW
81.0000 mg | CHEWABLE_TABLET | Freq: Every day | ORAL | Status: DC
  Administered 2020-02-13 – 2020-02-15 (×3): 81 mg
  Filled 2020-02-12 (×3): qty 1

## 2020-02-12 MED ORDER — HYDROCODONE-ACETAMINOPHEN 5-325 MG PO TABS
1.0000 | ORAL_TABLET | Freq: Four times a day (QID) | ORAL | Status: DC | PRN
  Administered 2020-02-13 (×2): 1
  Filled 2020-02-12 (×3): qty 1

## 2020-02-12 MED ORDER — PROCHLORPERAZINE 25 MG RE SUPP: 12.5000 mg | Freq: Four times a day (QID) | RECTAL | Status: DC | PRN

## 2020-02-12 MED ORDER — PROCHLORPERAZINE MALEATE 5 MG PO TABS: 5.0000 mg | ORAL_TABLET | Freq: Four times a day (QID) | ORAL | Status: DC | PRN

## 2020-02-12 MED ORDER — PROCHLORPERAZINE EDISYLATE 10 MG/2ML IJ SOLN: 5.0000 mg | Freq: Four times a day (QID) | INTRAMUSCULAR | Status: DC | PRN

## 2020-02-12 MED ORDER — ALUM & MAG HYDROXIDE-SIMETH 200-200-20 MG/5ML PO SUSP: 30.0000 mL | ORAL | Status: DC | PRN

## 2020-02-12 MED ORDER — ACETAMINOPHEN 325 MG PO TABS
325.0000 mg | ORAL_TABLET | ORAL | Status: DC | PRN
  Filled 2020-02-12: qty 2

## 2020-02-12 MED ORDER — DIPHENHYDRAMINE HCL 12.5 MG/5ML PO ELIX: 12.5000 mg | ORAL_SOLUTION | Freq: Four times a day (QID) | ORAL | Status: DC | PRN

## 2020-02-12 MED ORDER — QUETIAPINE FUMARATE 25 MG PO TABS
25.0000 mg | ORAL_TABLET | Freq: Every evening | ORAL | Status: DC | PRN
Start: 2020-02-12 — End: 2020-02-12

## 2020-02-12 MED ORDER — CYCLOBENZAPRINE HCL 10 MG PO TABS
10.0000 mg | ORAL_TABLET | Freq: Three times a day (TID) | ORAL | Status: DC | PRN
  Administered 2020-02-13 – 2020-02-15 (×3): 10 mg
  Filled 2020-02-12 (×3): qty 1

## 2020-02-12 MED ORDER — MELATONIN 3 MG PO TABS
3.0000 mg | ORAL_TABLET | Freq: Every day | ORAL | Status: DC
  Administered 2020-02-12 – 2020-02-14 (×3): 3 mg
  Filled 2020-02-12 (×3): qty 1

## 2020-02-12 NOTE — Progress Notes (Signed)
Pt agreed to suppository at 1727. Moderate result at 1914.   Gerald Stabs, RN

## 2020-02-12 NOTE — Progress Notes (Signed)
Speech Language Pathology Daily Session Note  Patient Details  Name: Gregory Bautista MRN: [id number] Date of Birth: 1940/12/02  Today's Date: 02/12/2020 SLP Individual Time: 1500-1530 SLP Individual Time Calculation (min): 30 min   Skilled Therapeutic Interventions:  Patient seen to address dysphagia goals. Patient reported that he ate a little food today and has been drinking some fluids, but that he continues to not have much desire to eat or drink by mouth. Patient completed oral care at sink with setup assistance and encouragement to do so (he was initially saying "I can do it tomorrow"). Patient exhibited intermittent cough/throat clear in absence of PO's, which was likely pharyngeal secretions. Patient did not exhibit any overt s/s of aspiration or penetration with small amount of cup sips of thin water.    Sonia Baller, MA, CCC-SLP Speech Therapy

## 2020-02-12 NOTE — Progress Notes (Signed)
Patient ID: Gregory Bautista, male   DOB: 29-Jan-1940, 80 y.o.   MRN: [id number]  SW received updates from [facility] reporting pt approved for SNF placement for [facility] (ref HK#:[id number] VF#[id number]/ [phone]). States pt is approved for 5 days with clinical review due on 1/24 if patient does not d/c to SNF. SW inquired if family were to select another SNF location what is process. Reports a new authorization will need to be submitted.  SW received updates from [facility] that they were able to extend bed offer today. Reports not sure if there will be a bed available on Monday.   SW updated attending on above with regard to bed offer. Pt unable to d/c today due to recent medication changes.   SW met with pt, and conference call with pt dtr [name] and brother [name] to provide updates. Family amenable to SNF at [facility]. SW updated [name]missions with [facility] on bed offer and confirmed family in agreement with placement. Pt scheduled for [facility] ambulance pick up at 11am on 1/24.  *SW discussed with [name]/ADmissions if pt can be accepted on continuous TF, reports D.O.N. stated able to manage. SW faxed over RD note to [phone]. SW to follow-up on Monday about room number and confirm d/c.   [name], MSW, [place] Office: [phone] Cell: [phone] Fax: [phone]

## 2020-02-12 NOTE — Discharge Instructions (Signed)
Inpatient Rehab Discharge Instructions  Gregory Bautista Discharge date and time:  02/15/20  Activities/Precautions/ Functional Status: Activity: no lifting, driving, or strenuous exercise till cleared by MD Diet: regular diet Wound Care: keep wound clean and dry    Functional status:  ___ No restrictions     ___ Walk up steps independently _X__ 24/7 supervision/assistance   ___ Walk up steps with assistance ___ Intermittent supervision/assistance  ___ Bathe/dress independently ___ Walk with walker     _X__ Bathe/dress with assistance ___ Walk Independently    ___ Shower independently ___ Walk with assistance    ___ Shower with assistance _X__ No alcohol     ___ Return to work/school ________  Special Instructions:    My questions have been answered and I understand these instructions. I will adhere to these goals and the provided educational materials after my discharge from the hospital.  Patient/Caregiver Signature _______________________________ Date __________  Clinician Signature _______________________________________ Date __________  Please bring this form and your medication list with you to all your follow-up doctor's appointments.    Gastrostomy Tube Home Guide, Adult A gastrostomy tube, or G-tube, is a tube that is inserted through the abdomen into the stomach. The tube is used to give feedings and medicines when a person cannot eat and drink enough on his or her own or take medicines by mouth. How to care for the insertion site Supplies needed:  Saline solution or clean, warm water and soap. Saline solution is made of salt and water.  Cotton swab or gauze.  Pre-cut gauze bandage (dressing) and tape, if needed. Instructions Follow these steps daily to clean the insertion site: 1. Wash your hands with soap and water for at least 20 seconds. 2. Remove the dressing (if there is one) that is between the person's skin and the tube. 3. Check the area where the  tube enters the skin. Check daily for problems such as: ? Redness, rash, or irritation. ? Swelling. ? Pus-like drainage. ? Extra skin growth. 4. Moisten the cotton swab or gauze with the saline solution or with a soap-and-water mixture. Gently clean around the insertion site. Remove any drainage or crusted material. ? When the G-tube is first put in, a normal saline solution or water can be used to clean the skin. ? After the skin around the tube has healed, mild soap and water may be used. 5. Apply a dressing (if there should be one) between the person's skin and the tube.   How to flush a G-tube Flush the G-tube regularly to keep it from clogging. Flush it before and after feedings and as often as told by the health care provider. Supplies needed:  Purified or germ-free (sterile) water, warmed.  Container with lid for boiling water, if needed.  60 cc G-tube syringe. Instructions Before you begin, decide whether to use sterile water or purified drinking water.  Use only sterile water if: ? The person has a weak disease-fighting (immune) system. ? The person has trouble fighting off infections (is immunocompromised). ? You are unsure about the amount of chemical contaminants in purified or drinking water.  Use purified drinking water in all other cases. To purify drinking water by boiling: ? Boil water for at least 1 minute. Keep lid over water while it boils. ? Let water cool to room temperature before using. Follow these steps to flush the G-tube: 1. Wash your hands with soap and water for at least 20 seconds. 2. Bring out (draw up) 30 mL  of warm water in a syringe. 3. Connect the syringe to the tube. 4. Slowly and gently push the water into the tube. General tips  If the tube comes out: ? Cover the opening with a clean dressing and tape. ? Get help right away.  If there is skin or scar tissue growing where the tube enters the skin: ? Keep the area clean and dry. ? Secure  the tube with tape so that the tube does not move around too much.  If the tube gets clogged: ? Slowly push warm water into the tube with a large syringe. ? Do not force the fluid into the tube or push an object into the tube. ? Get help right away if you cannot unclog the tube. Follow these instructions at home: Feedings  Give feedings at room temperature.  If feedings are continuous: ? Do not put more than 4 hours' worth of feedings in the feeding bag. ? Stop the feedings when you need to give medicine or flush the tube. Be sure to restart the feedings. ? Make sure the person's head is above his or her stomach (upright position). This will prevent choking and discomfort.  Make sure the person is in the right position during and after feedings. ? During feedings, have the person in the upright position. ? After a non-continuous feeding (bolus feeding), have the person stay in the upright position for 1 hour.  Cover and place unused feedings in the refrigerator.  Replace feeding bags and syringes as told. Good hygiene  Make sure the person takes good care of his or her mouth and teeth (oral hygiene), such as by brushing his or her teeth.  Keep the area where the tube enters the skin clean and dry. General instructions  Use syringes made only for G-tubes.  Do not pull or put tension on the tube.  Before you remove the tube cap or disconnect a syringe, close the tube by using a clamp (clamping) or bending (kinking) the tube.  Measure the length of the G-tube every day from the insertion site to the end of the tube.  If the person's G-tube has a balloon, check the fluid in the balloon every week. Check the manufacturer's specifications to find the amount of fluid that should be in the balloon.  Remove excess air from the G-tube as told. This is called venting.  Do not push feedings, medicines, or flushes fast. Contact a health care provider if:  The person with the tube has  constipation or a fever.  A large amount of fluid or mucus-like liquid is leaking from the tube.  Skin or scar tissue appears to be growing where the tube enters the skin.  The length of tube from the insertion site to the G-tube gets longer. Get help right away if:  The person with the tube has any of these problems: ? Severe pain, tenderness, or bloating in the abdomen. ? Nausea or vomiting. ? Trouble breathing or shortness of breath.  Any of these problems happen in the area where the tube enters the skin: ? Redness, irritation, swelling, or soreness. ? Pus-like discharge. ? A bad smell.  The tube is clogged and cannot be flushed.  The tube comes out. The tube will need to put back in within 4 hours. Summary  A gastrostomy tube, or G-tube, is a tube that is inserted through the abdomen into the stomach. The tube is used to give feedings and medicines when a person cannot eat  and drink enough on his or her own or cannot take medicine by mouth.  Check and clean the insertion site daily as told by the person's health care provider.  Flush the G-tube regularly to keep it from clogging. Flush it before and after feedings and as often as told.  Keep the area where the tube enters the skin clean and dry. This information is not intended to replace advice given to you by your health care provider. Make sure you discuss any questions you have with your health care provider. Document Revised: 05/25/2019 Document Reviewed: 05/28/2019 Elsevier Patient Education  [place].

## 2020-02-12 NOTE — Progress Notes (Signed)
Pt became increasingly agitated and confused throughout the night. Initially refused all medications as witnessed by Loree Fee E., LPN. This RN able to give some medications via tube during bed change after large BM. At one point, pt threating to hit staff with telephone in hand. 0000 VS incomplete as pt was resting and calm. Day shift RN made aware of overnight status.

## 2020-02-12 NOTE — Progress Notes (Signed)
Physical Therapy Session Note  Patient Details  Name: Sayvon Arterberry MRN: [id number] Date of Birth: 15-Nov-1940  Today's Date: 02/12/2020 PT Individual Time: 1105-1150 PT Individual Time Calculation (min): 45 min   Short Term Goals: Week 5:  PT Short Term Goal 1 (Week 5): =LTG due to ELOS  Skilled Therapeutic Interventions/Progress Updates: Pt presented in w/c agreeable to therapy. Pt c/o pain at upper L chest area, unable to rate. Pt expressed that he thinks he may be incontinent "I can smell it", but has not recollection of having a BM.  Pt agreeable to ambulate to bathroom and sit at toilet for peri-care. Tube feed paused and and pt performed ambulatory transfer to bathroom where pt noted to be incontinent of bowel. Pt able to stand for ~3 min while PTA performed peri-care and ensuring cleanliness around foley insertion. Pt did require seated rest due to fatigue and PTA noting increased unsteadiness. PTA donned brief and pt able to stand to allow PTA to complete peri-care, due to liquid BM required increased time to complete. Pt then ambulating back to w/c with x 1 bout L knee buckling however pt stating done intentionally. Pt returned to w/c and expressed increased fatigue. Pt left in w/c at end of session with tube feed set back up and running, call bell within reach and needs met.      Therapy Documentation Precautions:  Precautions Precautions: Back,Fall Precaution Comments: reviewed precautions Restrictions Weight Bearing Restrictions: No General: PT Amount of Missed Time (min): 15 Minutes PT Missed Treatment Reason: Other (Comment) (fatigue) Vital Signs: Therapy Vitals Temp: 98.3 F (36.8 C) Temp Source: Oral Pulse Rate: (!) 104 Resp: 18 BP: 91/63 Patient Position (if appropriate): Sitting Oxygen Therapy SpO2: 92 % O2 Device: Room Air Pain: Pain Assessment Pain Scale: Faces Faces Pain Scale: Hurts a little bit Pain Type: Chronic pain Pain Location:  Generalized Pain Descriptors / Indicators: Aching Pain Frequency: Intermittent Pain Onset: On-going Patients Stated Pain Goal: 3 Pain Intervention(s): Medication (See eMAR) (tramadol given)  Therapy/Group: Individual Therapy  Sherrey North 02/12/2020, 4:20 PM

## 2020-02-12 NOTE — Progress Notes (Signed)
Occupational Therapy Session Note  Patient Details  Name: Gregory Bautista MRN: [id number] Date of Birth: 1940/11/12  Today's Date: 02/12/2020 OT Individual Time: [phone] OT Individual Time Calculation (min): 37 min   Skilled Therapeutic Interventions/Progress Updates:     Pt greeted in bed, reporting that he felt "miserable," hurting "all over." RN notified to provide him with some pain medicine. Pt was agreeable to transfer to the chair to eat his breakfast, tray appearing untouched but in front of him. OT first donned Teds + ACE wraps, pt already wearing his abdominal binder for BP mgt. When asked if his brief was clean, pt stated "I don't know." Pt found to have large liquid BM in brief, Total A for hygiene + brief change while pt rolled Rt>Lt with Mod A given instruction. Supine<sit completed with Mod A for back precaution adherence. While EOB, OT assisted with donning pants and footwear due to time constraints. Min A for sit<stand to pull up pants and for stand pivot<w/c using RW. Pt was set up for breakfast again, safety belt fastened, satting at 100% on 2L 02. RN notified to encourage him to eat more and provide general supervision for a bit to increase his PO intake. All needs within reach.   Therapy Documentation Precautions:  Precautions Precautions: Back,Fall Precaution Comments: reviewed precautions Restrictions Weight Bearing Restrictions: No Vital Signs: Therapy Vitals Temp: 98.1 F (36.7 C) Temp Source: Oral Pulse Rate: 94 Resp: 16 BP: 113/76 Patient Position (if appropriate): Sitting Oxygen Therapy SpO2: (!) 88 % O2 Device: Nasal Cannula ADL: ADL Eating: Set up Grooming: Setup Where Assessed-Grooming: Sitting at sink Upper Body Bathing: Supervision/safety Where Assessed-Upper Body Bathing: Sitting at sink Lower Body Bathing: Moderate assistance Where Assessed-Lower Body Bathing: Standing at sink,Sitting at sink Upper Body Dressing: Supervision/safety Where  Assessed-Upper Body Dressing: Sitting at sink Lower Body Dressing: Moderate assistance Where Assessed-Lower Body Dressing: Wheelchair Toileting: Moderate assistance Where Assessed-Toileting: Glass blower/designer: Psychiatric nurse Method: Stand Ecologist: Raised toilet seat :     Therapy/Group: Individual Therapy  Iziah Cates A Suzetta Timko 02/12/2020, 12:30 PM

## 2020-02-12 NOTE — Progress Notes (Signed)
Manufacturing engineer [facility])  Hospital Liaison: RN note           This patient has been referred to our palliative care services in the community.  [facility] will continue to follow for any discharge planning needs and to coordinate continuation of palliative care in the outpatient setting.      If you have questions or need assistance, please call [phone] or contact the hospital Liaison listed on [facility].        Thank you for this referral.           Farrel Gordon, RN, CCM   [place] (listed on [place] under [facility])     [phone]

## 2020-02-13 ENCOUNTER — Inpatient Hospital Stay (HOSPITAL_COMMUNITY): Payer: Medicare Other | Admitting: Physical Therapy

## 2020-02-13 ENCOUNTER — Inpatient Hospital Stay (HOSPITAL_COMMUNITY): Payer: Medicare Other | Admitting: Speech Pathology

## 2020-02-13 ENCOUNTER — Inpatient Hospital Stay (HOSPITAL_COMMUNITY): Payer: Medicare Other | Admitting: Occupational Therapy

## 2020-02-13 NOTE — Progress Notes (Signed)
[facility] PHYSICAL MEDICINE & REHABILITATION PROGRESS NOTE   Subjective/Complaints:  Pt refused  Bowel program per pt (tried per nursing note, but was painful- ended up having a large BM after bowel program)..  Also getting agitated at night/confused more than normal, but Seroquel not given.  Will write order to remind them to use Seorquel and give meds- don't let him refuse when can go by PEG.    Pt reports he "plans on eating' , but hasn't been eating more than a few bites.      ROS:  Limited by cognition  Objective:   No results found. No results for input(s): WBC, HGB, HCT, PLT in the last 72 hours. No results for input(s): NA, K, CL, CO2, GLUCOSE, BUN, CREATININE, CALCIUM in the last 72 hours.  Intake/Output Summary (Last 24 hours) at 02/13/2020 1154 Last data filed at 02/13/2020 1035 Gross per 24 hour  Intake 240 ml  Output 1025 ml  Net -785 ml    Physical Exam: Vital Signs Blood pressure (!) 98/56, pulse (!) 102, temperature 98.4 F (36.9 C), temperature source Oral, resp. rate 19, height 5\' 9"  (1.753 m), weight 61.9 kg, SpO2 96 %. Gen: awake, but confused- thinks I'm thinking badly of him". Not agitated this AM, wearing O2, NAD HEENT: lips dry-  Cardio: tachycardic- rate low 100s   Chest:CTA B/L- no W/R/R- good air movement- O2 by  Abd: Soft, NT, ND, (+)BS - has PEG Ext: no clubbing, cyanosis, or edema- very tight in L>R upper traps- also bones more prominent -losing weight- becoming more obvious has lost more weight Psych: very confused-  Skin: Warm and dry.  Intact. Musc: No edema in extremities.  No tenderness in extremities. Neuro: somewhat confused again- no change Motor: Bilateral upper extremities: Grossly 5/5 proximal distal Bilateral lower extremities: Grossly 4+/5 proximal to distal, stable  Assessment/Plan: 1. Functional deficits which require 3+ hours per day of interdisciplinary therapy in a comprehensive inpatient rehab setting.  Physiatrist  is providing close team supervision and 24 hour management of active medical problems listed below.  Physiatrist and rehab team continue to assess barriers to discharge/monitor patient progress toward functional and medical goals  Care Tool:  Bathing    Body parts bathed by patient: Right arm,Left arm,Chest,Abdomen,Right upper leg,Left upper leg,Face,Front perineal area   Body parts bathed by helper: Buttocks,Right lower leg,Left lower leg Body parts n/a: Front perineal area,Buttocks,Right lower leg,Left lower leg   Bathing assist Assist Level: Minimal Assistance - Patient > 75%     Upper Body Dressing/Undressing Upper body dressing   What is the patient wearing?: Pull over shirt    Upper body assist Assist Level: Set up assist    Lower Body Dressing/Undressing Lower body dressing      What is the patient wearing?: Incontinence brief,Pants     Lower body assist Assist for lower body dressing: Moderate Assistance - Patient 50 - 74%     Toileting Toileting    Toileting assist Assist for toileting: Maximal Assistance - Patient 25 - 49% Assistive Device Comment: BSC   Transfers Chair/bed transfer  Transfers assist     Chair/bed transfer assist level: Contact Guard/Touching assist Chair/bed transfer assistive device: Armrests,Walker   Locomotion Ambulation   Ambulation assist      Assist level: Contact Guard/Touching assist Assistive device: Walker-rolling Max distance: 38ft   Walk 10 feet activity   Assist     Assist level: Contact Guard/Touching assist Assistive device: Walker-rolling   Walk 50 feet activity  Assist Walk 50 feet with 2 turns activity did not occur: Safety/medical concerns  Assist level: Minimal Assistance - Patient > 75% Assistive device: Walker-rolling    Walk 150 feet activity   Assist Walk 150 feet activity did not occur: Safety/medical concerns         Walk 10 feet on uneven surface  activity   Assist Walk 10  feet on uneven surfaces activity did not occur: Safety/medical concerns         Wheelchair     Assist Will patient use wheelchair at discharge?: Yes Type of Wheelchair: Manual    Wheelchair assist level: Supervision/Verbal cueing Max wheelchair distance: 7'    Wheelchair 50 feet with 2 turns activity    Assist        Assist Level: Supervision/Verbal cueing   Wheelchair 150 feet activity     Assist      Assist Level: Moderate Assistance - Patient 50 - 74%   Blood pressure (!) 98/56, pulse (!) 102, temperature 98.4 F (36.9 C), temperature source Oral, resp. rate 19, height 5\' 9"  (1.753 m), weight 61.9 kg, SpO2 96 %.  Medical Problem List and Plan: 1.  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  1/7- spoke to brother yesterday who said he'd pass on to daughter- sounds like he didn't- palliative care to see pt  1/10- palliative saw pt- is DNAR/DNI now  1/11- called Oncology on call- trying to figure out plan for treatment- was told 12/27 that would do radiation and then immediately do Chemo, however, due to dispo issues (family cannot take home) needs to go to SNF after radiation- spoke at length to Dr Burr Medico- she or Dr Julien Nordmann will see pt today/tomorrow.   1/12- Cannot do PET scan until d/c from hospital. Dr Tommy Medal to see pt Friday  Continue CIR  2.  Antithrombotics: -DVT/anticoagulation:   Continue Heparin.  Mechanical: Sequential compression devices, below knee Bilateral lower extremities   Dopplers negative for DVT  -antiplatelet therapy: N/A 3. Pain Management:    Decreased oxycodone to q4H prn.  DC gabapentin since causing constipation  Added Cymbalta 30 mg daily.    1/3- will increase Duloxetine to 60 mg nightly- for nerve pain  1/5- not taking pain meds often- encouraged pt to take pain meds before therapy.   1/6- will reduce oxy to Norco q4 hours prn due to increased confusion per family (nursing hasn't seen it)  with pain meds  1/9 pt told me his pain is under control presently  1/10- will place lidoderm patches on shoulders/upper traps B/L where muscles REAL tight.   1/11- can't have lidoderm in >3 patches, but refused yesterday- will check into this.  1/12- saw after left room, pt refused ALL meds this AM-   1/13- no pain meds in 2-4 days- tramadol or Norco 1/15: d/c Norco since patient is not requiring  1/17- will d/c Cymbalta since won't be crushed- also restarted norco for when Tramadol not enough-   1/18- NPO this AM- but con't regimen  1/20 encourage pt to take pain meds if hurting- con't regimen  1/22- pain 7/10- encourage prn pain meds 4. Mood: LCSW to follow for evaluation and support.              -  antipsychotic agents: N/a  Discussed with nursing, he has been sleeping well.  5. Neuropsych: This patient is capable of making decisions on his own behalf.  Appreciate neuropsych 6. Skin/Wound Care: Routine pressure relief measures.  7. Fluids/Electrolytes/Nutrition: Monitor I/Os.             Added juven to promote healing.  8. Neurogenic bladder: Foley in place due to urinary retention/UTI, with plans to remove on Monday  Flomax daily initiated on 12/30, monitor blood pressure closely  Florinef 0.1 mg daily initiated to assist with low BP.   Improving on 1/2  1/3- will d/c foley this afternoon, after radiation  1/4- wasn't removed- will remove today- this AM and ordered I/o caths and PVRs since might not void.  1/5- so far, no voiding- cathing required 100%   1/6-9- no voiding- caths volumes 300-600c q6 hours  1/10- no voiding- in/out caths being done- pt doesn't want foley back.  1/11- no voiding still  1/12- no voiding- doing I/o  caths- nursing doing- will need foley when goes to SNF   1/17- wait on placing foley for now- d/w family- but stop Flomax since cannot be crushed  1/18- will need foley when goes to SNF- they will not accept with in/out caths  1/22- foley placed since refusing in/out caths 9.  Acute renal failure: Resolved  Creatinine 0.86 on 12/27, 0.78 on 1/14, repeat weekly.  10. Acute blood loss anemia:   Hemoglobin 12.4 on 12/28, 10.5 on 1/14, repeat weekly.  1/19- Hb 11.1- con't regimen   Continue to monitor 11. Metastatic Lung AdenoCA:  On dulera--has been on supplemental oxygen  1/4- didn't get radiation yesterday- is scheduled ot go back today.  1/6- Getting radiation daily M-F  1/13- Dr Julien Nordmann to see Friday- cannot get PET scan since inpt- explained to family.   12. Kleb/Proteus UTI: Completed course of Cipro 13. Resting tachycardia: 12/21 sinus tach RBBB on  EKG   1/12- cannot give B blocker due to low BP/orthostasis even though is a little tachycardic  1/19- is worse- in 120s- will check EKG!  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  14. Hypotension/tachycardia-   Severe orthostasis, negative on 12/31  Relatively controlled on 1/2  TEDs/abdominal binder for support.   1/5- added midodrine 5 mg TID-AC for low Bps  1/7- increased Midodrine to 10 mg TID- will also increase Florinef to help with orthostatic hypotension. It's really bad when goes to radiation  1/17- Pt refusing all meds including florinef and Midodrine- will back down doses slightly since hasn't taken in days- when restarts with Cortrak  1/18- decreased florinef to 0.1 mg and Midodrine to 5mg  TID with meals  1/19- BP 98/66 laying down- likely  orthostatic- con't midodrine/florinef per PEG  1/22- refusing meds still- will con't current Florinef/Midodrine-   15.  Sleep disturbance: Scheduled melatonin at nights.   Trazodone 100  1/3- sleeping well- con't regimen 17. Tenting/mild dehydration- will push 6 cups/water daily minimum  1/6- skin very dry- order eucerin AND push PO fluids  1/19- BUN OK, but likely more dry than appears due to loss of muscle mass.  18. Hyperkalemia  Lokelma ordered on 12/23  Potassium 4.6 on 12/27 19. Pancoast tumor/ metastatic lung cancer:   Radiation to begin on 1/3 and to see Dr Julien Nordmann 1/3 at 2:15 pm  1/4- didn't get radiation- will make sure scheduled to  go back today  1/5- made sure pt went to radiation -his BP was low- but gave him 500cc IVFs NS bolus to help with BP- BP was 82/50s.   1/7- Increased Midodrine and florienf to help with low BP.  20. Dysphagia per pt  12/30- ordered SLP eval and treat  1/3- placed on D3 thin diet  1/4- is better when sitting up at 90 degrees  1/6- will add Protonix for reflux Sx's.   1/8 MBS performed 1/7--mod oral/mild pharyng dysphagia   -D1, thins recommended by SLP, meds with puree  1/10-  Pt says doesn't help much with D1 thins.   1/12- per SLP, working on D2, but without teeth, cannot do D2 right now until passed trials  1/17- will place Cortrak and try to get PEG ASAP via general surgery- d/w family both and know if we don't do, he won't make it more than another few weeks.   1/18- supposed to get PEG today- with anesthesia  1/19- got PEG- getting nutrition and meds via PEG 21. Hypercalcemia  1/4- is down to 11 from 11.2- will monitor- if goes back up more, will treat. 22. Poor appetite  1/10- will try Marinol 2.5 mg BID  1/12- not working as of yet- placed nutrition consult since not taking supplements - "too sticky"  1/17- stopped due to increased confusion.  22. Depression  1/11- on Cymbalta but cannot add Wellbutrin- has allergy- will put small dose  of Celexa 10 mg daily on board-so he doesn't have issues with serotonin syndrome.   1/12- think it's the reason pt refused meds this AM, encouraged pt to take his meds for low BP, depression, et  1/17- changed cymbalta to celex 20 mg since can be crushed.   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  1/13- want to make sure doesn't have stroke- will check head CT asap. Also called Ophtho- they said call back when confusion better and will examine.  Until then, cannot.   1/17- head CT/ looks OK- no stroke- found out was refusing glaucoma meds 24. Confusion  1/13- could be marinol, since not receiving pain meds- will stop marinol  1/22- getting agitated- added Seroquel prn at night- hasn't been given yet 25. Severe malnutrition  1/13- has lost 16 lbs in last 3 weeks from 12/21- d/w family about a PEG-  And they agreed- cannot get from IR because hx of hernia repair with mesh- will call gen surgery.   1/17- getting PEG as soon as possible  1/19- got PEG_ getting nutrition 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 Diastolic dysfunction, grade 1: 1/14 ECHO reviewed         LOS: 32 days A FACE TO FACE EVALUATION WAS PERFORMED  Gregory Bautista 02/13/2020, 11:54 AM

## 2020-02-13 NOTE — Progress Notes (Signed)
Occupational Therapy Session Note  Patient Details  Name: Gregory Bautista MRN: [id number] Date of Birth: November 16, 1940  Today's Date: 02/13/2020 OT Individual Time: 1300-1343 OT Individual Time Calculation (min): 43 min    Skilled Therapeutic Interventions/Progress Updates:    Pt greeted in the w/c with family present. Lunch had just arrived. We donned his ACE wraps over Teds and also abdominal binder for BP support. Pt needed max cuing and encouragement to initiate taking bites of his meal, facilitation for smaller bites due to increased time required for him to chew/swallow and pt reporting difficultly with "getting it down." Facilitation also for interchanging solids/liquids per swallowing strategies. Set the room up initially to minimize distractions/stimulation to improve attention to task beforehand. Afterwards pt reported needing to void bowels. Left him in care of NT to complete stand pivot<BSC transfer using RW, pt still on his 2L 02.   Therapy Documentation Precautions:  Precautions Precautions: Back,Fall Precaution Comments: reviewed precautions Restrictions Weight Bearing Restrictions: No Vital Signs: Therapy Vitals Temp: 97.9 F (36.6 C) Temp Source: Oral Pulse Rate: (!) 101 Resp: 18 BP: 108/60 Patient Position (if appropriate): Lying Oxygen Therapy SpO2: 100 % O2 Device: Nasal Cannula O2 Flow Rate (L/min): 2 L/min Pain: neuropathic hand pain, notified RN of pts request for pain medicine   ADL: ADL Eating: Set up Grooming: Setup Where Assessed-Grooming: Sitting at sink Upper Body Bathing: Supervision/safety Where Assessed-Upper Body Bathing: Sitting at sink Lower Body Bathing: Moderate assistance Where Assessed-Lower Body Bathing: Standing at sink,Sitting at sink Upper Body Dressing: Supervision/safety Where Assessed-Upper Body Dressing: Sitting at sink Lower Body Dressing: Moderate assistance Where Assessed-Lower Body Dressing: Wheelchair Toileting:  Moderate assistance Where Assessed-Toileting: Glass blower/designer: Minimal Print production planner Method: Stand Ecologist: Raised toilet seat      Therapy/Group: Individual Therapy  Skeet Simmer 02/13/2020, 3:43 PM

## 2020-02-13 NOTE — Progress Notes (Signed)
Physical Therapy Session Note  Patient Details  Name: Gregory Bautista MRN: [id number] Date of Birth: July 24, 1940  Today's Date: 02/13/2020 PT Individual Time: 1330-1425 PT Individual Time Calculation (min): 55 min   Short Term Goals: Week 5:  PT Short Term Goal 1 (Week 5): =LTG due to ELOS  Skilled Therapeutic Interventions/Progress Updates:    pt received in [facility] just finishing BSC and toileting with NCT. Pt reported 7/10 pain "all over", nursing present for medication passing and made aware. Pt reported he was very fatigued post transferring on and off BSC and only agreeable to in room therapy, "I'll do the best I can, in here", per pt. PT suggested music in room to improve pt's overall mood and tolerance to activity. Pt agreeable, this was turned on and pt directed in trunk sway to music, BLE slight hip flexion, and B DF to promote activity tolerance and mobility. Pt very distractible and required multiple VC for attention to task and pt demonstrated noted conversation changes and stopping of conversation with distractions. Pt reported he was too tired for more activity and requested to return to bed at end of session. Pt directed in Stand pivot transfer to bedside with VC for turning safety 2/2 multiple lines however pt did not come to complete stand and attempted to turn to bedside with excessive trunk flexion and then support himself to turn and sit however max VC to stand fully, take full step to turn in front of bed for safety and with this pt seemed to be very confused with lines and tubes and unable to problem solve cues and sat at EOB. Pt directed in Sit to stand from EOB to Rolling walker to improve line/tube placement, pt required max A and x3 attempts to complete and then PT able to rearrange lines for safety. Pt directed in static sitting at EOB to allow for IV pole placement at head of bed and doffing abdominal binder, however pt then laid down at side bed and required max A for foley bag  management to prevent pulling or poor positioning and once in supine, pt attempted to pull nasal canula tube to adjust, educated to not pull from wall and PT directed pt in supine, midline bed to allow PT to assist with tubes pt agreed. This was completed, pt reported comfort, all lines and tubes intact and in good position at end of session, pt left in bed, HOB above 30 degrees, alarm set, All needs in reach and in good condition. Call light in hand.  Nursing made aware.   Therapy Documentation Precautions:  Precautions Precautions: Back,Fall Precaution Comments: reviewed precautions Restrictions Weight Bearing Restrictions: No General:   Vital Signs:  Pain:   Mobility:   Locomotion :    Trunk/Postural Assessment :    Balance:   Exercises:   Other Treatments:      Therapy/Group: Individual Therapy  [name] 02/13/2020, 2:47 PM

## 2020-02-13 NOTE — Progress Notes (Signed)
Pt received all his evening and HS scheduled medications. Pt is calm and cooperative, no sign of agitation. Seroquel not given, doesn't meet criteria.Pt comfortably sleeping at this time.Will continue to monitor.

## 2020-02-14 ENCOUNTER — Inpatient Hospital Stay (HOSPITAL_COMMUNITY): Payer: Medicare Other | Admitting: Occupational Therapy

## 2020-02-14 ENCOUNTER — Inpatient Hospital Stay (HOSPITAL_COMMUNITY): Payer: Medicare Other | Admitting: Physical Therapy

## 2020-02-14 ENCOUNTER — Inpatient Hospital Stay (HOSPITAL_COMMUNITY): Payer: Medicare Other | Admitting: Speech Pathology

## 2020-02-14 NOTE — Progress Notes (Signed)
Physical Therapy Discharge Summary  Patient Details  Name: Gregory Bautista MRN: [id number] Date of Birth: Dec 21, 1940  Today's Date: 02/15/2020  Patient has met 0 of 10 long term goals due to decreased activity tolerance, decreased balance, decreased endurance as well as cognitive deficits limiting insights into his deficits and functional implications. Pt's hospital stay has included radiation treatment which has exacerbated his overall weakness and decreased his endurance and tolerance for therapy sessions. Pt's ability to functionally participate in sessions was also limited by ongoing orthostatic hypotension limiting his ability to participate in standing activities such as gait, balance, and stairs.  Patient to discharge at a wheelchair level [place].   Patient's care partner unavailable to provide the necessary physical and cognitive assistance at discharge, therefore patient will d/c to SNF as family is not able to provide the current level of care that he requires.  Reasons goals not met: Pt did not meet rehab goals due to minimal gains made during rehab stay due to decreased overall endurance from radiation treatments as well as orthostatic hypotension limiting patient's ability to participate in standing activities. Pt also exhibited decreased initiation and needed encouragement for participation in sessions in a functional manner.  Recommendation:  Patient will benefit from ongoing skilled PT services in skilled nursing facility setting to continue to advance safe functional mobility, address ongoing impairments in endurance, strength, balance, safety, independence with functional mobility, and minimize fall risk.  Equipment: No equipment provided. TBD by accepting facility.  Reasons for discharge: lack of progress toward goals and discharge from hospital  Patient/family agrees with progress made and goals achieved: Yes  PT  Discharge Precautions/Restrictions Precautions Precautions: Back;Fall Precaution Comments: reviewed precautions Restrictions Weight Bearing Restrictions: No Vision/Perception  Perception Perception: Within Functional Limits Praxis Praxis: Intact  Cognition Overall Cognitive Status: Impaired/Different from baseline Arousal/Alertness: Awake/alert Orientation Level: Oriented to person;Oriented to place;Disoriented to time;Disoriented to situation Attention: Focused Focused Attention: Appears intact Memory: Impaired Memory Impairment: Decreased short term memory Awareness: Impaired Awareness Impairment: Anticipatory impairment Problem Solving: Impaired Safety/Judgment: Impaired Sensation Sensation Light Touch: Appears Intact Proprioception: Impaired Detail Coordination Gross Motor Movements are Fluid and Coordinated: No Fine Motor Movements are Fluid and Coordinated: No Coordination and Movement Description: slow from muscle weakness Motor  Motor Motor: Paraplegia;Abnormal postural alignment and control Motor - Skilled Clinical Observations: generalized muscle weakness Motor - Discharge Observations: generalized muscle weakness  Mobility Bed Mobility Bed Mobility: Rolling Right;Rolling Left;Supine to Sit;Sit to Supine Rolling Right: Supervision/verbal cueing Rolling Left: Supervision/Verbal cueing Supine to Sit: Supervision/Verbal cueing Sit to Supine: Supervision/Verbal cueing Transfers Transfers: Sit to Stand;Stand Pivot Transfers Sit to Stand: Minimal Assistance - Patient > 75% Stand Pivot Transfers: Minimal Assistance - Patient > 75% Stand Pivot Transfer Details: Verbal cues for sequencing;Verbal cues for technique;Verbal cues for precautions/safety;Verbal cues for safe use of DME/AE Transfer (Assistive device): Rolling walker Locomotion  Gait Ambulation: Yes Gait Assistance: Minimal Assistance - Patient > 75% Gait Distance (Feet): 134 Feet Assistive device:  Rolling walker Gait Assistance Details: Verbal cues for sequencing;Verbal cues for technique;Verbal cues for precautions/safety;Verbal cues for safe use of DME/AE Gait Gait: Yes Gait Pattern: Impaired Gait Pattern: Decreased step length - right;Decreased step length - left;Right flexed knee in stance;Left flexed knee in stance;Trunk flexed;Narrow base of support Gait velocity: decreased Stairs / Additional Locomotion Stairs: Yes Stairs Assistance: Minimal Assistance - Patient > 75% Stair Management Technique: One rail Left;Step to pattern Number of Stairs: 4 Height of Stairs: 6 Wheelchair Mobility Wheelchair Mobility: Yes Wheelchair  Assistance: Supervision/Verbal Herbalist: Both upper extremities Wheelchair Parts Management: Needs assistance Distance: 150  Trunk/Postural Assessment  Cervical Assessment Cervical Assessment: Exceptions to [facility] (forward head) Thoracic Assessment Thoracic Assessment: Exceptions to [facility] (kyphotic) Lumbar Assessment Lumbar Assessment: Within Functional Limits Postural Control Postural Control: Deficits on evaluation (impaired in standing)  Balance Balance Balance Assessed: Yes Static Sitting Balance Static Sitting - Balance Support: No upper extremity supported;Feet supported Static Sitting - Level of Assistance: 5: Stand by assistance Dynamic Sitting Balance Dynamic Sitting - Balance Support: No upper extremity supported;Feet supported;During functional activity Dynamic Sitting - Level of Assistance: 5: Stand by assistance Static Standing Balance Static Standing - Balance Support: Bilateral upper extremity supported;During functional activity Static Standing - Level of Assistance: 4: Min assist Dynamic Standing Balance Dynamic Standing - Balance Support: Bilateral upper extremity supported;During functional activity Dynamic Standing - Level of Assistance: 4: Min assist Extremity Assessment   RLE Assessment RLE Assessment:  Exceptions to [facility] General Strength Comments: see below RLE Strength Right Hip Flexion: 3/5 Right Knee Flexion: 4/5 Right Knee Extension: 4/5 Right Ankle Dorsiflexion: 4/5 LLE Assessment LLE Assessment: Exceptions to [facility] General Strength Comments: see below LLE Strength Left Hip Flexion: 3/5 Left Knee Flexion: 4/5 Left Knee Extension: 4/5 Left Ankle Dorsiflexion: 4/5     Excell Seltzer, PT, DPT 02/15/2020, 4:28 PM

## 2020-02-14 NOTE — Progress Notes (Signed)
[place] PHYSICAL MEDICINE & REHABILITATION PROGRESS NOTE   Subjective/Complaints:  Pt did suppository last night per daughter and pt-  Doing OK per pt, but not eating more than 1-2 bites of each part of meal.   Saw 2nd time with daughter and her husband- Brother on phone.   Pt said gets agitated at night if "people try to agitate him"- explained he can ask for Seroquel if he wants, but otherwise, asked him in front of family to not refuse suppository or any other meds, esp glaucoma drops.   Discussed needs to see oncology when leaves here and will be scheduled to see me in ~ 4-6 weeks- might need to see NP, but since she doesn't know him, schedule on day I'm there in clinic.    ROS:  Limited by cognition  Objective:   No results found. No results for input(s): WBC, HGB, HCT, PLT in the last 72 hours. No results for input(s): NA, K, CL, CO2, GLUCOSE, BUN, CREATININE, CALCIUM in the last 72 hours.  Intake/Output Summary (Last 24 hours) at 02/14/2020 1228 Last data filed at 02/14/2020 1159 Gross per 24 hour  Intake 180 ml  Output 2755 ml  Net -2575 ml    Physical Exam: Vital Signs Blood pressure 97/62, pulse (!) 102, temperature 98.5 F (36.9 C), resp. rate 19, height 5\' 9"  (1.753 m), weight 61.9 kg, SpO2 95 %. Gen: awake, sitting up watching TV, 2nd time, daughter and husband in room, pt MUCH brighter when daughter there, more talkative, less confused, wearing O2 by Malcolm- sitting up in bedside chair, NAD HEENT: lips less dry Cardio: tachycardic- rate 100s Chest: CTA B/L- no W/R/R- good air movement- O2 by [place] Abd: Soft, NT, ND, (+)BS  - has PEG Ext: no clubbing, cyanosis, or edema- very tight in L>R upper traps- also bones more prominent -losing weight- becoming more obvious has lost more weight Psych: some confusion, but more with it when seen with daughter Skin: Warm and dry.  Intact. Musc: No edema in extremities.  No tenderness in extremities. Neuro: somewhat confused  again- slightly improved Motor: Bilateral upper extremities: Grossly 5/5 proximal distal Bilateral lower extremities: Grossly 4+/5 proximal to distal, stable  Assessment/Plan: 1. Functional deficits which require 3+ hours per day of interdisciplinary therapy in a comprehensive inpatient rehab setting.  Physiatrist is providing close team supervision and 24 hour management of active medical problems listed below.  Physiatrist and rehab team continue to assess barriers to discharge/monitor patient progress toward functional and medical goals  Care Tool:  Bathing    Body parts bathed by patient: Right arm,Left arm,Chest,Abdomen,Right upper leg,Left upper leg,Face,Front perineal area   Body parts bathed by helper: Buttocks,Right lower leg,Left lower leg Body parts n/a: Front perineal area,Buttocks,Right lower leg,Left lower leg   Bathing assist Assist Level: Minimal Assistance - Patient > 75%     Upper Body Dressing/Undressing Upper body dressing   What is the patient wearing?: Pull over shirt    Upper body assist Assist Level: Set up assist    Lower Body Dressing/Undressing Lower body dressing      What is the patient wearing?: Incontinence brief,Pants     Lower body assist Assist for lower body dressing: Moderate Assistance - Patient 50 - 74%     Toileting Toileting    Toileting assist Assist for toileting: Maximal Assistance - Patient 25 - 49% Assistive Device Comment: BSC   Transfers Chair/bed transfer  Transfers assist     Chair/bed transfer assist level: Minimal  Assistance - Patient > 75% Chair/bed transfer assistive device: Museum/gallery exhibitions officer assist      Assist level: Contact Guard/Touching assist Assistive device: Walker-rolling Max distance: 15ft   Walk 10 feet activity   Assist     Assist level: Contact Guard/Touching assist Assistive device: Walker-rolling   Walk 50 feet activity   Assist Walk 50 feet with 2  turns activity did not occur: Safety/medical concerns  Assist level: Minimal Assistance - Patient > 75% Assistive device: Walker-rolling    Walk 150 feet activity   Assist Walk 150 feet activity did not occur: Safety/medical concerns         Walk 10 feet on uneven surface  activity   Assist Walk 10 feet on uneven surfaces activity did not occur: Safety/medical concerns         Wheelchair     Assist Will patient use wheelchair at discharge?: Yes Type of Wheelchair: Manual    Wheelchair assist level: Supervision/Verbal cueing Max wheelchair distance: 45'    Wheelchair 50 feet with 2 turns activity    Assist        Assist Level: Supervision/Verbal cueing   Wheelchair 150 feet activity     Assist      Assist Level: Moderate Assistance - Patient 50 - 74%   Blood pressure 97/62, pulse (!) 102, temperature 98.5 F (36.9 C), resp. rate 19, height 5\' 9"  (1.753 m), weight 61.9 kg, SpO2 95 %.  Medical Problem List and Plan: 1.  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  1/7- spoke to brother yesterday who said he'd pass on to daughter- sounds like he didn't- palliative care to see pt  1/10- palliative saw pt- is DNAR/DNI now  1/11- called Oncology on call- trying to figure out plan for treatment- was told 12/27 that would do radiation and then immediately do Chemo, however, due to dispo issues (family cannot take home) needs to go to SNF after radiation- spoke at length to Dr Burr Medico- she or Dr Julien Nordmann will see pt today/tomorrow.   1/12- Cannot do PET scan until d/c from hospital. Dr Tommy Medal to see pt Friday  1/23- less confused last night, likely because daughter was  here  Continue CIR  2.  Antithrombotics: -DVT/anticoagulation:   Continue Heparin.  Mechanical: Sequential compression devices, below knee Bilateral lower extremities   Dopplers negative for DVT  -antiplatelet therapy: N/A 3. Pain Management:    Decreased oxycodone to q4H prn.  DC gabapentin since causing constipation  Added Cymbalta 30 mg daily.   1/3- will increase Duloxetine to 60 mg nightly- for nerve pain  1/5- not taking pain meds often- encouraged pt to take pain meds before therapy.   1/6- will reduce oxy to Norco q4 hours prn due to increased confusion per family (nursing hasn't seen it)  with pain meds 1/12- saw after left room, pt refused ALL meds this AM-   1/13- no pain meds in 2-4 days- tramadol or Norco 1/15: d/c Norco since patient is not requiring  1/17- will d/c Cymbalta since won't be crushed- also restarted norco for when Tramadol not enough-   1/18- NPO this AM- but con't regimen  1/20  encourage pt to take pain meds if hurting- con't regimen  1/23- pt hadn't asked for pain meds yet today- let nursing know 4. Mood: LCSW to follow for evaluation and support.              -antipsychotic agents: N/a  Discussed with nursing, he has been sleeping well.  5. Neuropsych: This patient is capable of making decisions on his own behalf.  Appreciate neuropsych 6. Skin/Wound Care: Routine pressure relief measures.  7. Fluids/Electrolytes/Nutrition: Monitor I/Os.             Added juven to promote healing.  8. Neurogenic bladder: Foley in place due to urinary retention/UTI, with plans to remove on Monday  Flomax daily initiated on 12/30, monitor blood pressure closely  Florinef 0.1 mg daily initiated to assist with low BP.   Improving on 1/2  1/3- will d/c foley this afternoon, after radiation  1/4- wasn't removed- will remove today- this AM and ordered I/o caths and PVRs since might not void.  1/5- so far, no voiding- cathing required 100%   1/6-9- no voiding- caths volumes  300-600c q6 hours  1/10- no voiding- in/out caths being done- pt doesn't want foley back.  1/11- no voiding still  1/12- no voiding- doing I/o caths- nursing doing- will need foley when goes to SNF   1/17- wait on placing foley for now- d/w family- but stop Flomax since cannot be crushed  1/18- will need foley when goes to SNF- they will not accept with in/out caths  1/22- foley placed since refusing in/out caths  1/23- will need foley for forseeable future 9.  Acute renal failure: Resolved  Creatinine 0.86 on 12/27, 0.78 on 1/14, repeat weekly.  10. Acute blood loss anemia:   Hemoglobin 12.4 on 12/28, 10.5 on 1/14, repeat weekly.  1/19- Hb 11.1- con't regimen   Continue to monitor 11. Metastatic Lung AdenoCA:  On dulera--has been on supplemental oxygen  1/4- didn't get radiation yesterday- is scheduled ot go back today.  1/6- Getting radiation daily M-F  1/13- Dr Julien Nordmann to see Friday- cannot get PET scan since inpt- explained to family.   12. Kleb/Proteus UTI: Completed course of Cipro 13. Resting tachycardia: 12/21 sinus tach RBBB on  EKG   1/12- cannot give B blocker due to low BP/orthostasis even though is a little tachycardic  1/19- is worse- in 120s- will check EKG!  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 Hypotension/tachycardia-   Severe orthostasis, negative on 12/31  Relatively controlled on 1/2  TEDs/abdominal binder for support.   1/5- added midodrine 5 mg TID-AC for low Bps  1/7- increased Midodrine to 10 mg TID- will also increase Florinef to help with orthostatic hypotension. It's really bad when goes to radiation  1/18- decreased florinef to 0.1 mg and  Midodrine to 5mg  TID with meals  1/22- refusing meds still- will con't current Florinef/Midodrine-   1/23- asked pt to not refuse meds- in front of family  22.  Sleep disturbance: Scheduled melatonin at nights.   Trazodone 100  1/3- sleeping well- con't regimen 17. Tenting/mild dehydration- will push 6 cups/water daily minimum  1/6- skin very dry- order eucerin AND push PO fluids  1/19- BUN OK, but likely more dry than appears due to loss of muscle mass.  18. Hyperkalemia  Lokelma ordered on 12/23  Potassium 4.6 on 12/27 19. Pancoast tumor/ metastatic lung cancer:   Radiation to begin on 1/3 and to see Dr Julien Nordmann 1/3 at 2:15 pm  1/4- didn't get radiation- will make sure scheduled to  go back today  1/5- made sure pt went to radiation -his BP was low- but gave him 500cc IVFs NS bolus to help with BP- BP was 82/50s.   1/7- Increased Midodrine and florienf to help with low BP.  20. Dysphagia per pt  12/30- ordered SLP eval and treat  1/3- placed on D3 thin diet  1/4- is better when sitting up at 90 degrees  1/6- will add Protonix for reflux Sx's.   1/8 MBS performed 1/7--mod oral/mild pharyng dysphagia   -D1, thins recommended by SLP, meds with puree  1/10-  Pt says doesn't help much with D1 thins.   1/12- per SLP, working on D2, but without teeth, cannot do D2 right now until passed trials  1/17- will place Cortrak and try to get PEG ASAP via general surgery- d/w family both and know if we don't do, he won't make it more than another few weeks.   1/18- supposed to get PEG today- with anesthesia  1/19- got PEG- getting nutrition and meds via PEG 21. Hypercalcemia  1/4- is down to 11 from 11.2- will monitor- if goes back up more, will treat. 22. Poor appetite  1/10- will try Marinol 2.5 mg BID  1/12- not working as of yet- placed nutrition consult since not taking supplements - "too sticky"  1/17- stopped due to increased confusion.  22. Depression  1/11- on Cymbalta but cannot add  Wellbutrin- has allergy- will put small dose of Celexa 10 mg daily on board-so he doesn't have issues with serotonin syndrome.   1/12- think it's the reason pt refused meds this AM, encouraged pt to take his meds for low BP, depression, et  1/17- changed cymbalta to celex 20 mg since can be crushed.   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  1/13- want to make sure doesn't have stroke- will check head CT asap. Also called Ophtho- they said call back when confusion better and will examine.  Until then, cannot.   1/17- head CT/ looks OK- no stroke- found out was refusing glaucoma meds  1/23- was refusing glaucoma drops- better since taking meds 24. Confusion  1/13- could be marinol, since not receiving pain meds- will stop marinol  1/22- getting agitated- added Seroquel prn at night- hasn't been given yet  1/23- no confusion per staff/agitation last night- doing better with daughter here 84. Severe malnutrition  1/13- has lost 16 lbs in last 3 weeks from 12/21- d/w family about a PEG-  And they agreed- cannot get from IR because hx of hernia repair with mesh- will call gen surgery.   1/17- getting PEG as soon as possible  1/19- got PEG_ getting nutrition 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 Diastolic dysfunction, grade 1: 1/14 ECHO reviewed 28. Dispo  1/23- d/c tomorrow possibly?   Had lengthy discussion with daughter and brother on phone today- >35 minutes on total car-e >50% coordination of care. Discussed options for chemo- not getting chemo, frequent side effects from chemo- but since I don't know chemo plan, cannot be  exact- pt is DNR/DNI currently- they will d/w pt/family if he wants hospice.        LOS: 33 days A FACE TO FACE EVALUATION WAS PERFORMED  Gregg Holster 02/14/2020, 12:28 PM

## 2020-02-14 NOTE — Progress Notes (Signed)
Occupational Therapy Session Note  Patient Details  Name: Gregory Bautista MRN: [id number] Date of Birth: 09-27-1940  Today's Date: 02/14/2020 OT Individual Time: 1115-1200 OT Individual Time Calculation (min): 45 min   Short Term Goals: Week 3:  OT Short Term Goal 1 (Week 3): STG=LTG secondary to ELOS OT Short Term Goal 1 - Progress (Week 3): Progressing toward goal  Skilled Therapeutic Interventions/Progress Updates:    Pt greeted in the w/c wearing Teds, binder, + ACE wraps. Family present. Pt c/o tightness in upper abdomen. When OT loosened abdominal binder pt reported this was "much better." He was agreeable to start session by brushing his teeth at the sink. Pt stood with CGA for a few minutes during oral care, sat down to clean partials due to limited standing endurance. Pt reported some lightheadedness initially during standing but then denied dizziness when asked for remainder of session. Stand pivot<elevated toilet completed with CGA using grab bar. Pt was unable to void bowels but hygiene completed for cleanliness. When he returned to the w/c he was set up with hand sanitizer and lotion. Left him with all needs within reach, in care of RN to assess routine vitals, satting at 93-95% on 2L.   Therapy Documentation Precautions:  Precautions Precautions: Back,Fall Precaution Comments: reviewed precautions Restrictions Weight Bearing Restrictions: No Vital Signs: Therapy Vitals Temp: 98.5 F (36.9 C) Pulse Rate: (!) 102 Resp: 19 BP: 97/62 Patient Position (if appropriate): Sitting Oxygen Therapy SpO2: 95 % O2 Device: Nasal Cannula O2 Flow Rate (L/min): 2 L/min  ADL: ADL Eating: Set up Grooming: Setup Where Assessed-Grooming: Sitting at sink Upper Body Bathing: Supervision/safety Where Assessed-Upper Body Bathing: Sitting at sink Lower Body Bathing: Moderate assistance Where Assessed-Lower Body Bathing: Standing at sink,Sitting at sink Upper Body Dressing:  Supervision/safety Where Assessed-Upper Body Dressing: Sitting at sink Lower Body Dressing: Moderate assistance Where Assessed-Lower Body Dressing: Wheelchair Toileting: Moderate assistance Where Assessed-Toileting: Glass blower/designer: Minimal Print production planner Method: Stand Ecologist: Raised toilet seat      Therapy/Group: Individual Therapy  Skeet Simmer 02/14/2020, 12:39 PM

## 2020-02-14 NOTE — Progress Notes (Signed)
Bowel program completed per order. Lidocaine applied. Patient tolerated well. Vault empty during initial application

## 2020-02-14 NOTE — Progress Notes (Signed)
Speech Language Pathology Daily Session Note  Patient Details  Name: Gregory Bautista MRN: [id number] Date of Birth: 06-Mar-1940  Today's Date: 02/14/2020 SLP Individual Time: 0233-4356 SLP Individual Time Calculation (min): 25 min  Short Term Goals: Week 3: SLP Short Term Goal 1 (Week 3): STG=LTG due to short ELOS  Skilled Therapeutic Interventions:  Pt was seen for skilled ST targeting dysphagia goals.  Pt was seated upright in bed with breakfast in front of him on table tray.  Pt continues to have decreased appetite (disconnected from tube feedings for today's treatment session) but was agreeable to consuming a few bites and sips of items on his breakfast tray.  Pt complained of globus sensation following pureed waffle and could verbally problem solve that he needed to alternate between solids and liquids to mitigate this; however, he needed min verbal cues to put this into practice.  No overt s/s of aspiration were evident with purees or thin liquids.  Pt's current diet continues to be appropriate for his level of function.  Pt was left in bed with bed alarm set and call bell within reach.  Continue per current plan of care.   Pain Pain Assessment Pain Scale: 0-10 Pain Score: 9  Pain Type: Chronic pain Pain Location: Rib cage Pain Orientation: Left Pain Descriptors / Indicators: Aching Pain Frequency: Intermittent Pain Intervention(s): RN made aware  Therapy/Group: Individual Therapy  Peggyann Zwiefelhofer, Selinda Orion 02/14/2020, 8:31 AM

## 2020-02-14 NOTE — Progress Notes (Signed)
Physical Therapy Session Note  Patient Details  Name: Gregory Bautista MRN: [id number] Date of Birth: 1940/06/03  Today's Date: 02/14/2020 PT Individual Time: 0930-1015 PT Individual Time Calculation (min): 45 min   Short Term Goals: Week 5:  PT Short Term Goal 1 (Week 5): =LTG due to ELOS  Skilled Therapeutic Interventions/Progress Updates:    Pt received semi-reclined in bed, agreeable to PT session. Pt reports pain in L chest to axillary region, not rated. Pt unsure if he had received pain medication yet this AM, nursing notified. Semi-reclined BP 93/61. Assisted pt with donning thigh-high TEDs and BLE ACE wrap. Pt found to be incontinent of BM in brief. Pt is dependent for brief change and pericare with Supervision for rolling R/L with cues for use of bedrails. Pt reports urge to have another BM. Supine to sit with Supervision with increased time, use of bedrail and HOB elevated. Assisted pt with donning abdominal binder in sitting. Stand pivot transfer bed to [facility] with RW and min A. Seated BP 101/59. Pt unable to void while seated on BSC. Stand pivot transfer to w/c with RW and min A. Pt left seated in w/c in room with needs in reach, quick release belt and chair alarm in place, family present at end of session. MD notified that pt's family wishing to speak with her this AM.  Therapy Documentation Precautions:  Precautions Precautions: Back,Fall Precaution Comments: reviewed precautions Restrictions Weight Bearing Restrictions: No   Therapy/Group: Individual Therapy   Excell Seltzer, PT, DPT  02/14/2020, 12:20 PM

## 2020-02-15 ENCOUNTER — Inpatient Hospital Stay (HOSPITAL_COMMUNITY): Payer: Medicare Other | Admitting: Physical Therapy

## 2020-02-15 ENCOUNTER — Ambulatory Visit: Payer: Medicare Other | Admitting: Physician Assistant

## 2020-02-15 ENCOUNTER — Inpatient Hospital Stay (HOSPITAL_COMMUNITY): Payer: Medicare Other

## 2020-02-15 ENCOUNTER — Other Ambulatory Visit: Payer: Medicare Other

## 2020-02-15 ENCOUNTER — Ambulatory Visit: Payer: Medicare Other

## 2020-02-15 DIAGNOSIS — R1310 Dysphagia, unspecified: Secondary | ICD-10-CM

## 2020-02-15 DIAGNOSIS — D492 Neoplasm of unspecified behavior of bone, soft tissue, and skin: Secondary | ICD-10-CM

## 2020-02-15 DIAGNOSIS — K5900 Constipation, unspecified: Secondary | ICD-10-CM

## 2020-02-15 DIAGNOSIS — R630 Anorexia: Secondary | ICD-10-CM

## 2020-02-15 LAB — CBC WITH DIFFERENTIAL/PLATELET
Abs Immature Granulocytes: 0.03 10*3/uL (ref 0.00–0.07)
Abs Immature Granulocytes: 0.02 10*3/uL (ref 0.00–0.07)
Basophils Absolute: 0 10*3/uL (ref 0.0–0.1)
Basophils Absolute: 0 10*3/uL (ref 0.0–0.1)
Basophils Relative: 0 %
Basophils Relative: 0 %
Eosinophils Absolute: 0 10*3/uL (ref 0.0–0.5)
Eosinophils Absolute: 0 10*3/uL (ref 0.0–0.5)
Eosinophils Relative: 1 %
Eosinophils Relative: 1 %
HCT: 26.8 % — ABNORMAL LOW (ref 39.0–52.0)
HCT: 30.6 % — ABNORMAL LOW (ref 39.0–52.0)
Hemoglobin: 8.5 g/dL — ABNORMAL LOW (ref 13.0–17.0)
Hemoglobin: 9.6 g/dL — ABNORMAL LOW (ref 13.0–17.0)
Immature Granulocytes: 1 %
Immature Granulocytes: 0 %
Lymphocytes Relative: 12 %
Lymphocytes Relative: 10 %
Lymphs Abs: 0.4 10*3/uL — ABNORMAL LOW (ref 0.7–4.0)
Lymphs Abs: 0.5 10*3/uL — ABNORMAL LOW (ref 0.7–4.0)
MCH: 28.3 pg (ref 26.0–34.0)
MCH: 27.8 pg (ref 26.0–34.0)
MCHC: 31.7 g/dL (ref 30.0–36.0)
MCHC: 31.4 g/dL (ref 30.0–36.0)
MCV: 89.3 fL (ref 80.0–100.0)
MCV: 88.7 fL (ref 80.0–100.0)
Monocytes Absolute: 0.5 10*3/uL (ref 0.1–1.0)
Monocytes Absolute: 0.6 10*3/uL (ref 0.1–1.0)
Monocytes Relative: 14 %
Monocytes Relative: 12 %
Neutro Abs: 2.6 10*3/uL (ref 1.7–7.7)
Neutro Abs: 3.6 10*3/uL (ref 1.7–7.7)
Neutrophils Relative %: 72 %
Neutrophils Relative %: 77 %
Platelets: 199 10*3/uL (ref 150–400)
Platelets: 238 10*3/uL (ref 150–400)
RBC: 3 MIL/uL — ABNORMAL LOW (ref 4.22–5.81)
RBC: 3.45 MIL/uL — ABNORMAL LOW (ref 4.22–5.81)
RDW: 12.9 % (ref 11.5–15.5)
RDW: 13 % (ref 11.5–15.5)
WBC: 3.6 10*3/uL — ABNORMAL LOW (ref 4.0–10.5)
WBC: 4.7 10*3/uL (ref 4.0–10.5)
nRBC: 0 % (ref 0.0–0.2)
nRBC: 0 % (ref 0.0–0.2)

## 2020-02-15 LAB — BASIC METABOLIC PANEL
Anion gap: 8 (ref 5–15)
BUN: 16 mg/dL (ref 8–23)
CO2: 29 mmol/L (ref 22–32)
Calcium: 8.9 mg/dL (ref 8.9–10.3)
Chloride: 97 mmol/L — ABNORMAL LOW (ref 98–111)
Creatinine, Ser: 0.57 mg/dL — ABNORMAL LOW (ref 0.61–1.24)
GFR, Estimated: >60 mL/min (ref >60)
Glucose, Bld: 116 mg/dL — ABNORMAL HIGH (ref 70–99)
Potassium: 4.6 mmol/L (ref 3.5–5.1)
Sodium: 134 mmol/L — ABNORMAL LOW (ref 135–145)

## 2020-02-15 LAB — SARS CORONAVIRUS 2 (TAT 6-24 HRS): SARS Coronavirus 2: NEGATIVE

## 2020-02-15 MED ORDER — TRAMADOL HCL 50 MG PO TABS
50.0000 mg | ORAL_TABLET | Freq: Four times a day (QID) | ORAL | 0 refills | Status: AC | PRN
Start: 2020-02-15 — End: ?

## 2020-02-15 MED ORDER — MELATONIN 3 MG PO TABS: 3.0000 mg | ORAL_TABLET | Freq: Every day | ORAL | 0 refills | Status: AC

## 2020-02-15 MED ORDER — MIDODRINE HCL 5 MG PO TABS: 5.0000 mg | ORAL_TABLET | Freq: Three times a day (TID) | ORAL | Status: AC

## 2020-02-15 MED ORDER — SENNA 8.6 MG PO TABS: 2.0000 | ORAL_TABLET | Freq: Every day | ORAL | 0 refills | Status: AC

## 2020-02-15 MED ORDER — PANTOPRAZOLE SODIUM 40 MG PO PACK: 40.0000 mg | PACK | Freq: Every day | ORAL | Status: AC

## 2020-02-15 MED ORDER — HYDROCODONE-ACETAMINOPHEN 5-325 MG PO TABS: 1.0000 | ORAL_TABLET | Freq: Four times a day (QID) | ORAL | 0 refills | Status: AC | PRN

## 2020-02-15 MED ORDER — PROCHLORPERAZINE 25 MG RE SUPP: 12.5000 mg | Freq: Four times a day (QID) | RECTAL | 0 refills | Status: AC | PRN

## 2020-02-15 MED ORDER — CITALOPRAM HYDROBROMIDE 20 MG PO TABS: 20.0000 mg | ORAL_TABLET | Freq: Every day | ORAL | Status: AC

## 2020-02-15 MED ORDER — TRAZODONE HCL 100 MG PO TABS: 100.0000 mg | ORAL_TABLET | Freq: Every day | ORAL | Status: AC

## 2020-02-15 MED ORDER — ASPIRIN 81 MG PO CHEW: 81.0000 mg | CHEWABLE_TABLET | Freq: Every day | ORAL | Status: AC

## 2020-02-15 MED ORDER — ACETAMINOPHEN 325 MG PO TABS: 325.0000 mg | ORAL_TABLET | ORAL | Status: AC | PRN

## 2020-02-15 MED ORDER — QUETIAPINE FUMARATE 25 MG PO TABS: 25.0000 mg | ORAL_TABLET | Freq: Every evening | ORAL | 0 refills | Status: AC | PRN

## 2020-02-15 MED ORDER — OSMOLITE 1.5 CAL PO LIQD: 1000.0000 mL | ORAL | 0 refills | Status: AC

## 2020-02-15 MED ORDER — PROCHLORPERAZINE EDISYLATE 10 MG/2ML IJ SOLN: 5.0000 mg | Freq: Four times a day (QID) | INTRAMUSCULAR | Status: AC | PRN

## 2020-02-15 MED ORDER — CYCLOBENZAPRINE HCL 10 MG PO TABS: 10.0000 mg | ORAL_TABLET | Freq: Three times a day (TID) | ORAL | 0 refills | Status: AC | PRN

## 2020-02-15 NOTE — Progress Notes (Signed)
Occupational Therapy Discharge Summary  Patient Details  Name: Gregory Bautista MRN: [id number] Date of Birth: [birth year]  Patient has met 2 of 8 long term goals due to improved activity tolerance and improved balance. Pt progress with BADLs and functional tranfsers was minimal and inconsistent. Pt's overall activity tolerance/endurance decreased during radiation therapy and pt required max encouragement to actively participate in therapies. Pt's nutritional intake was approx 10% and a PEG tube was placed during the later part of his stay in rehab. Pt discharge to SNF for continued therapies and rehab during the remainder of his treatments for cancer.  Patient's care partner unavailable to provide the necessary physical assistance at discharge.    Reasons goals not met: Pt still requires min A for dynamic standing balance, shower transfers, bathing, dressing, toileting, toilet transfers. Pt will still require  A for IADLs  Recommendation:  Patient will benefit from ongoing skilled OT services in skilled nursing facility setting to continue to advance functional skills in the area of BADL and Reduce care partner burden.  Equipment: No equipment provided discharge to SNF  Reasons for discharge: discharge from hospital  Patient/family agrees with progress made and goals achieved: Yes  OT Discharge  Vision Baseline Vision/History: Wears glasses Wears Glasses: At all times Patient Visual Report: No change from baseline Perception  Perception: Within Functional Limits Praxis Praxis: Intact Cognition Overall Cognitive Status: Difficult to assess Arousal/Alertness: Awake/alert Orientation Level: Oriented X4 Attention: Focused Focused Attention: Appears intact Awareness: Impaired Problem Solving: Impaired Safety/Judgment: Impaired Sensation Sensation Light Touch: Appears Intact Hot/Cold: Appears Intact Proprioception: Impaired Detail Stereognosis: Not tested Coordination Gross  Motor Movements are Fluid and Coordinated: No Fine Motor Movements are Fluid and Coordinated: No Coordination and Movement Description: slow from muscle weakness Motor  Motor Motor: Paraplegia;Abnormal postural alignment and control Motor - Skilled Clinical Observations: generalized muscle weakness Trunk/Postural Assessment  Cervical Assessment Cervical Assessment: Within Functional Limits Thoracic Assessment Thoracic Assessment: Within Functional Limits Lumbar Assessment Lumbar Assessment: Within Functional Limits  Balance Static Sitting Balance Static Sitting - Balance Support: Feet supported Static Sitting - Level of Assistance: 5: Stand by assistance Dynamic Sitting Balance Dynamic Sitting - Balance Support: During functional activity Dynamic Sitting - Level of Assistance: 5: Stand by assistance Extremity/Trunk Assessment RUE Assessment RUE Assessment: Within Functional Limits Active Range of Motion (AROM) Comments: WFL General Strength Comments: 3+/5 LUE Assessment LUE Assessment: Within Functional Limits Active Range of Motion (AROM) Comments: WFL General Strength Comments: 3+/5   Rich Brave 02/15/2020, 7:05 AM

## 2020-02-15 NOTE — Progress Notes (Signed)
Plan discharge to [facility] SNF. Attempted to call report on patient to facility. Patient made aware of arrangements and transportation ETA.

## 2020-02-15 NOTE — Progress Notes (Signed)
Patient ID: Gregory Bautista, male   DOB: [birth year], 80 y.o.   MRN: [id number] Have placed PTAR on hold due to usnure if medically ready for transfer to NH today-per MD and PAm-PA

## 2020-02-15 NOTE — Progress Notes (Signed)
Occupational Therapy Session Note  Patient Details  Name: Gregory Bautista MRN: [id number] Date of Birth: 1940-07-19  Today's Date: 02/15/2020 OT Individual Time: 0700-0755 OT Individual Time Calculation (min): 55 min    Short Term Goals: Week 4:  OT Short Term Goal 1 (Week 4): Pt will don pants with min A sit<>stand from w/c or EOB OT Short Term Goal 2 (Week 4): Pt will perform toilet transfers with CGA using RW OT Short Term Goal 3 (Week 4): Pt will tolerate standing for 3 mins during functional tasks/ADLs with CGA  Skilled Therapeutic Interventions/Progress Updates:    Pt in bed upon arrival with breakfast tray placed on table in front of pt. When questioned on how he was doing, pt commented that he was just about to get OOB. Ted hose donned and BLE wrapped with ace wraps before pt sat EOB. Supine>sit EOB with supervision. Pt required mod A for donning pants and managing catheter. Sit<>stand with min A and stand pivot transfer with RW at min A. Pt requires mod verbal cues for technique and safety awareness during transfer. Pt required encouragement for eating breakfast and required more then a reasonable amount of time to initiate. Intake was minimal during OT session. Pt remained in w/c with breakfast on table in front. All needs within reach.   Therapy Documentation Precautions:  Precautions Precautions: Back,Fall Precaution Comments: reviewed precautions Restrictions Weight Bearing Restrictions: No  Pain:  Pt denies pain this morning  Therapy/Group: Individual Therapy  Leroy Libman 02/15/2020, 8:02 AM

## 2020-02-15 NOTE — Progress Notes (Signed)
Inpatient Rehabilitation Care Coordinator Discharge Note  The overall goal for the admission was met for:   Discharge location: Yes. D/c to SNF-[facility] Rm #[id number]; Nursing report [phone]  Length of Stay: Yes. 33 days.   Discharge activity level: Yes. Minimal Assistance  Home/community participation: Yes  Services provided included: MD, RD, PT, OT, SLP, RN, CM, TR, Pharmacy, Neuropsych and SW  Financial Services: Private Insurance: [facility] Medicare  Choices offered to/list presented EI:HDTPNSQZ.gov SNF list and [place] DHHS SNF list   Follow-up services arranged: N/A  Comments (or additional information): contact pt dtr [name] [phone]  Patient/Family verbalized understanding of follow-up arrangements: Yes  Individual responsible for coordination of the follow-up plan: Pt to have assistance with coordinating his care needs.   Confirmed correct DME delivered: Rana Snare 02/15/2020    Rana Snare

## 2020-02-15 NOTE — Progress Notes (Signed)
Patient ID: Gregory Bautista, male   DOB: Apr 17, 1940, 80 y.o.   MRN: [id number] Report called to [facility] SN[facility]. [name]

## 2020-02-15 NOTE — Progress Notes (Addendum)
Gregory Bautista PHYSICAL MEDICINE & REHABILITATION PROGRESS NOTE   Subjective/Complaints: Patient seen sitting up in his chair this morning.  He states he slept well overnight. No reported issues overnight.   ROS: Limited by cognition  Objective:   No results found. Recent Labs    02/15/20 0541  WBC 3.6*  HGB 8.5*  HCT 26.8*  PLT 199   Recent Labs    02/15/20 0541  NA 134*  K 4.6  CL 97*  CO2 29  GLUCOSE 116*  BUN 16  CREATININE 0.57*  CALCIUM 8.9    Intake/Output Summary (Last 24 hours) at 02/15/2020 0833 Last data filed at 02/15/2020 9326 Gross per 24 hour  Intake 120 ml  Output 1600 ml  Net -1480 ml    Physical Exam: Vital Signs Blood pressure (!) 103/53, pulse (!) 109, temperature 98.2 F (36.8 C), resp. rate 17, height 5\' 9"  (1.753 m), weight 61.9 kg, SpO2 98 %. Constitutional: No distress . Vital signs reviewed.  Frail. HENT: Normocephalic.  Atraumatic. Eyes: EOMI. No discharge. Cardiovascular: No JVD.  RRR. Respiratory: Normal effort.  No stridor.  Bilateral clear to auscultation. GI: Non-distended.  BS +. +PEG. Skin: Warm and dry.  Intact. Psych: Confused.  Normal behavior. Musc: No edema in extremities.  No tenderness in extremities. Neuro: Alert Motor: Bilateral upper extremities: Grossly 5/5 proximal distal, unchanged Bilateral lower extremities: Grossly 4+/5 proximal to distal, stable  Assessment/Plan: 1. Functional deficits which require 3+ hours per day of interdisciplinary therapy in a comprehensive inpatient rehab setting.  Physiatrist is providing close team supervision and 24 hour management of active medical problems listed below.  Physiatrist and rehab team continue to assess barriers to discharge/monitor patient progress toward functional and medical goals  Care Tool:  Bathing    Body parts bathed by patient: Right arm,Left arm,Chest,Abdomen,Right upper leg,Left upper leg,Face,Front perineal area   Body parts bathed by helper:  Buttocks,Right lower leg,Left lower leg Body parts n/a: Front perineal area,Buttocks,Right lower leg,Left lower leg   Bathing assist Assist Level: Minimal Assistance - Patient > 75%     Upper Body Dressing/Undressing Upper body dressing   What is the patient wearing?: Pull over shirt    Upper body assist Assist Level: Set up assist    Lower Body Dressing/Undressing Lower body dressing      What is the patient wearing?: Incontinence brief,Pants     Lower body assist Assist for lower body dressing: Moderate Assistance - Patient 50 - 74%     Toileting Toileting    Toileting assist Assist for toileting: Maximal Assistance - Patient 25 - 49% Assistive Device Comment: BSC   Transfers Chair/bed transfer  Transfers assist     Chair/bed transfer assist level: Minimal Assistance - Patient > 75% Chair/bed transfer assistive device: Programmer, multimedia   Ambulation assist      Assist level: Contact Guard/Touching assist Assistive device: Walker-rolling Max distance: 17ft   Walk 10 feet activity   Assist     Assist level: Contact Guard/Touching assist Assistive device: Walker-rolling   Walk 50 feet activity   Assist Walk 50 feet with 2 turns activity did not occur: Safety/medical concerns  Assist level: Minimal Assistance - Patient > 75% Assistive device: Walker-rolling    Walk 150 feet activity   Assist Walk 150 feet activity did not occur: Safety/medical concerns         Walk 10 feet on uneven surface  activity   Assist Walk 10 feet on uneven surfaces activity did  not occur: Safety/medical concerns         Wheelchair     Assist Will patient use wheelchair at discharge?: Yes Type of Wheelchair: Manual    Wheelchair assist level: Supervision/Verbal cueing Max wheelchair distance: 44'    Wheelchair 50 feet with 2 turns activity    Assist        Assist Level: Supervision/Verbal cueing   Wheelchair 150 feet  activity     Assist      Assist Level: Moderate Assistance - Patient 50 - 74%   Blood pressure (!) 103/53, pulse (!) 109, temperature 98.2 F (36.8 C), resp. rate 17, height 5\' 9"  (1.753 m), weight 61.9 kg, SpO2 98 %.  Medical Problem List and Plan: 1.  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  Further, family decided they do not want current SNF and prefer alternative location.  Patient will see attending MD for transitional care management in 1 month post-discharge 2.  Antithrombotics: -DVT/anticoagulation:   Continue Heparin.  Mechanical: Sequential compression devices, below knee Bilateral lower extremities   Dopplers negative for DVT  -antiplatelet therapy: N/A 3. Pain Management:   DC gabapentin since causing constipation  Norco q4 hours prn due to increased confusion per family with pain meds 4. Mood: LCSW to follow for evaluation and support.              -antipsychotic agents: N/a  Discussed with nursing, he has been sleeping well.  5. Neuropsych: This patient is capable of making decisions on his own behalf.  Appreciate neuropsych 6. Skin/Wound Care: Routine pressure relief measures.  7. Fluids/Electrolytes/Nutrition: Monitor I/Os.             Added juven to promote healing.  8. Neurogenic bladder:   Flomax daily initiated on 12/30, monitor blood pressure closely  Florinef 0.1 mg daily initiated to assist with low BP.   Will need foley for forseeable future 9.  Acute renal failure: Resolved  Creatinine 0.86 on 12/27, 0.78 on 1/14, repeat weekly.  10. Acute blood loss anemia:   Hemoglobin 8.5 on 1/24, will repeat stat labs  Continue to monitor 11. Metastatic Lung AdenoCA:  On  dulera--has been on supplemental oxygen  1/6- Getting radiation daily M-F  1/13- Dr Julien Nordmann to see Friday- cannot get PET scan since inpt- explained to family.   12. Kleb/Proteus UTI: Completed course of Cipro 13. 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 Hypotension/tachycardia-   TEDs/abdominal binder for support.   Continue Florinef/Midodrine  See #13  15.  Sleep disturbance: Scheduled melatonin at nights.   Improved on meds 17. Tenting/mild dehydration- will push 6 cups/water daily minimum 18. Hyperkalemia  Lokelma ordered on 12/23  Potassium 4.6 on 1/24 19. Pancoast tumor/ metastatic lung cancer:   Radiation to begin on 1/3 and to see Dr Julien Nordmann 1/3 at 2:15 pm  20. Dysphagia  D1 thins   S/p PEG- getting nutrition and meds via PEG 21. Hypercalcemia  8.9 on 1/24 22. Poor appetite  See #20 22. Depression  Changed cymbalta to celex 20 mg since can be crushed.   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  CT unremarkable for acute intracranial process  Was refusing glaucoma drops- better since taking meds 24. Confusion  ?  Improving 25. Severe malnutrition  See #20 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 Diastolic dysfunction, grade 1: 1/14 ECHO reviewed  Greater than 35 minutes spent with team in coordination of care regarding plans for discharge, hemoglobin, after mentioned.  LOS: 34 days A FACE TO FACE EVALUATION WAS PERFORMED  Gregory Bautista Lorie Phenix 02/15/2020, 8:33 AM

## 2020-02-15 NOTE — Progress Notes (Signed)
Pt received all his evening and HS medications as scheduled. pain management in progress, effective, No signs of agitation during this tour. Covid 19 swab test done and sent to lab. Resut is pending. Patient comfortably sleeping at this time.

## 2020-02-15 NOTE — Progress Notes (Signed)
Manufacturing engineer [facility]) Community Based Palliative Care    This patient has been referred to our palliative care services in the community.  [facility] will continue to follow for any discharge planning needs and to coordinate admission onto palliative care.    Thank you for the opportunity to participate in this patient's care.     Domenic Moras, BSN, RN [facility] Liaison

## 2020-02-15 NOTE — Progress Notes (Signed)
Patient ID: Gregory Bautista, male   DOB: 08/30/1940, 80 y.o.   MRN: [id number] Discharged to [facility] SNF via [facility]. Belongings packed and sent with the patient. [name]

## 2020-02-15 NOTE — Progress Notes (Addendum)
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 Tumor was not felt to be resectable due to advanced disease and spine stabilization recommended with plans for chemo and XRT in the future. He was evaluated by Dr. Arnoldo Morale and was admitted on 12/30/2019 for T2-T4 laminectomy for tumor resection with T1-T5 posterior arthrodesis with pedicle screws and rods. 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 Plans are for XRT by Dr. Randa Ngo with reports to of simulation set for 12/27. Had been set for follow up with Dr. Earlie Server on outpatient basis.   Palliative care was asked to get involved in the setting of metastatic lung cancer and the patient no longer wishing to continue treatments.  Today's Discussion (02/15/2020): Chart reviewed.  Patient was initially slated to leave today though this has been placed on hold in the setting of anemia. Of note patients family has also elected to change preferred discharge location to [facility] center.   [facility] collective is involved to aid in additional goals of care conversations moving forward. PET scanned is planned once discharged from the hospital.  Patient has an established follow up with Dr. Inda Merlin on Friday which will be helpful in terms of conversations moving forward.   Patients daughter, Gregory Bautista updated over the phone.   Discussed the importance of continued  conversation with family and their  medical providers regarding overall plan of care and treatment options, ensuring decisions are within the context of the patients values and GOCs.  Objective Assessment: Vital Signs Vitals:   02/15/20 1033 02/15/20 1155  BP: 99/61 (!) 101/58  Pulse: (!) 113 (!) 113  Resp: 19 18  Temp: (!) 97.4 F (36.3 C) 98.4 F (36.9 C)  SpO2: 100% 100%    Intake/Output Summary (Last 24 hours) at 02/15/2020 1334 Last data filed at 02/15/2020 0710 Gross per 24 hour  Intake 20 ml  Output 1300 ml  Net -1280 ml   Last Weight  Most recent update: 02/06/2020  7:32 AM   Weight  61.9 kg (136 lb 7.4 oz)           Gen:  Older AA M in NAD HEENT: moist mucous membranes CV: Regular rate and rhythm  PULM: clear to auscultation bilaterally  ABD: soft/nontender  EXT: No edema  Neuro: Alert and oriented x3-4  SUMMARY OF RECOMMENDATIONS DNAR/DNI  MOST Completed, paper copy placed onto the chart electric copy can be found in [facility]  DNR Form Completed, paper copy placed onto the chart electric copy can be found in [facility]  Outpatient Palliative Support ordered - Again, if patient continue to neglect to make great improvements strongly recommend hospice consideration.  Both Gregory Bautista and his daughter Gregory Bautista are aware of this recommendation.  Spiritual support   Time Spent: 25 Greater than 50% of the time was spent in counseling and coordination of care ______________________________________________________________________________________ [place] Team Team Cell Phone: [phone] Please utilize  secure chat with additional questions, if there is no response within 30 minutes please call the above phone number  Palliative Medicine Team providers are available by phone from 7am to 7pm daily and can be reached through the team cell phone.  Should this patient require assistance outside of these hours, please call the patient's  attending physician.

## 2020-02-15 NOTE — Progress Notes (Signed)
Physical Therapy Session Note  Patient Details  Name: Gregory Bautista MRN: [id number] Date of Birth: Dec 06, 1940  Today's Date: 02/15/2020 PT Individual Time: 0900-1000 PT Individual Time Calculation (min): 60 min   Short Term Goals: Week 5:  PT Short Term Goal 1 (Week 5): =LTG due to ELOS  Skilled Therapeutic Interventions/Progress Updates:    Pt received seated in w/c in room, reports being unable to breathe and states, "I'm just going to die right here". Assessed pt's vitals, SpO2 100% on 2L via [place], BP 100/60. Education with patient regarding vitals and breathing techniques. Pt able to be calmed down and reports improvement in breathing. Nursing notified of pt's symptoms as well. Assisted pt with donning abdominal binder for BP management. Seated BP 97/62 with use of abdominal binder. Deferred standing activity this session due to ongoing orthostatic BP. Manual w/c propulsion 2 x 100 ft, 1 x 150 ft with use of BUE at Supervision level with cues for propulsion technique with focus on increasing overall endurance. Pt does require min A for safe w/c management up/down incline. Seated BP 97/65, SpO2 100% on 2L O2 following w/c propulsion. Sit to stand with min A to RW in order to assist pt with pulling up pants over hips. Pt left seated in w/c in room with needs in reach, quick release belt and chair alarm in place at end of session.  Therapy Documentation Precautions:  Precautions Precautions: Back,Fall Precaution Comments: reviewed precautions Restrictions Weight Bearing Restrictions: No    Therapy/Group: Individual Therapy   Excell Seltzer, PT, DPT  02/15/2020, 4:19 PM

## 2020-02-15 NOTE — Progress Notes (Signed)
Speech Language Pathology Discharge Summary  Patient Details  Name: Gregory Bautista MRN: [id number] Date of Birth: Jul 05, 1940  Today's Date: 02/15/2020 SLP Individual Time:  1002-1030     Skilled Therapeutic Interventions:Skilled ST services focused on swallow skills. Pt did not initially recall swallow strategies, however was able to utilize (alternating solids with liquids) with mod A fade to supervision A verbal cues. Pt continued to report globus sensation and did demonstrated mild oral holding, however the environment was distraction (nursing and labitorary present.) SLP provided education. All questions answered to satisfaction. Pt was left with call bell within reach and chair alarm set.     Patient has met 1 of 1 long term goals.  Patient to discharge at overall Supervision level.  Reasons goals not met: N/A   Clinical Impression/Discharge Summary: Patient is currently tolerating a Dys 1, thin liquids diet and although intake is very poor, patient is tolerating at the oropharyngeal level. He continues to exhibit delayed oral phase at will report that it is difficult for him to swallow solids. Currently, he has a PEG for nutrition secondary to poor overall intake. Patient is currently tolerating a Dys 1, thin liquids diet and although intake is very poor, patient is tolerating at the oropharyngeal level. He continues to exhibit delayed oral phase at will report that it is difficult for him to swallow solids. Currently, he has a PEG for nutrition secondary to poor overall intake. SLP recommends to continue a diet of dys 1 textures and thin liquids with full supervision to carryover (decline in recall with recent radiation treatments) alternating liquids and solids due to reports of globus sensation, as well as to encourage PO intake. Pt can upgrade solids as tolerated following the above listed precautions. Pt benefited from skilled ST services to maximize functional independence and reduce  burden of care, requiring 24 hour supervision and continued ST services for advancement when ready. Care Partner:  Caregiver Able to Provide Assistance: Other (comment)  Type of Caregiver Assistance: Physical;Cognitive  Recommendation:  Skilled Nursing facility;24 hour supervision/assistance  Rationale for SLP Follow Up: Maximize swallowing safety   Equipment: N/A   Reasons for discharge: Discharged from hospital   Patient/Family Agrees with Progress Made and Goals Achieved: Yes    [name]  [facility] 02/15/2020, 4:33 PM

## 2020-02-15 NOTE — Progress Notes (Addendum)
Patient ID: Gregory Bautista, male   DOB: [birth year], 80 y.o.   MRN: [id number]  SW spoke with [name]/Admissions with [facility] to inform no new updates with pt and pt is good for d/c today. SW informed pt assigned RN Neoma Laming on pt d/c today. SW followed up with pt dtr [name] [phone]) with regard to her concerns related to nursing home option. SW informed no updates from [facility] on if there continues to be a bed offer. SW informed waiting on follow-up.   SW waiting on updates from [facility] on if there are any beds available today.  Update: 9:0am- SW updated [name]/ Admissions with [facility] that pt current d/c is pending due to labs.   SW received updates from [facility] there is a bed available only for today. SW explained unsure on if pt can transition today due to pending labs, and pt will require new authorization. SW informed will provide updates once aware.   Update 10:55am- SW updates pt dtr [name] on above with regard to delay in d/c and updates from [facility]. [name] reported she would like to move forward with [facility]. SW explained that the bed is only available today,and we can not guarantee once there is authorization as there will be a bed available. She remains insistent on moving forward with [facility] as their admissions coordinator has assured them there is a bed available.  SW spoke with [facility] representative (ref HA#:[id number]/IOXB DZ#[id number]/ M:[phone])  to inform on family change in SNF from [place] to [facility]. Requested new updated clinicals and reports there will be follow-up.  New reference [id number]. SW faxed updated clinicals.   SW updated [name]/Admissions with [facility] on above to withdraw referral.   SW spoke with Chrisyln/[facility] [phone]) to inform on above. SW to follow-up when aware of pt d/c.    SW received phone call from [facility] inquiring about new changes with SNF. SW confirmed. Reports will review and there will be follow-up.  *SW spoke with [facility] who reported [facility] approved SNF placement; ref [id number]. Auth approved for 3 days (02/15/20-02/17/20). SW waiting on updates from [facility] about bed offer.   Update 2:20pm- Pt extended bed offer from [facility]. SW updated medical team, and pt cleared for d/c per attending. Request for pt to have CBC level checked within 2-3 days. SW updated SNF on details. SW called pt dtr [name] to update on pt d/c today. [facility] ambulance scheduled for 3:30pm pick up.  Pt going to Room 104; nurse report [phone].  [name], MSW, [place] Office: [phone] Cell: [phone] Fax: [phone]

## 2020-02-15 NOTE — Progress Notes (Signed)
Please see previous progress note.  Now plan to discharge to SNF today.

## 2020-02-16 ENCOUNTER — Inpatient Hospital Stay (HOSPITAL_COMMUNITY): Payer: Medicare Other

## 2020-02-16 ENCOUNTER — Ambulatory Visit: Payer: Medicare Other

## 2020-02-16 NOTE — Progress Notes (Deleted)
[facility] OFFICE PROGRESS NOTE  [name], MD [place] [address]  DIAGNOSIS: Metastatic non-small cell lung cancer, adenocarcinoma who presented with a left upper lobe pleural-based mass which extended into the upper mediastinum and upper thoracic spine.  PDL1 1%  No actionable mutations by ***  PRIOR THERAPY: 1) T2-3 and T3-4 laminectomy for resection of epidural spinal spinal tumor under the care of neurosurgery, Dr. Arnoldo Morale on 12/30/19 2) Palliative radiotherapy under the care of Dr. Sondra Come. Last treatment on 02/11/20  CURRENT THERAPY: ***  INTERVAL HISTORY: Gregory Bautista 80 y.o. male returns to the clinic today for follow-up visit.  The patient is feeling _today.  The patient had a hospitalization from 12/30/2019 to 01/12/2020.  The patient had a lymphadenectomy for an epidural extension of the tumor.  The patient had some postop complications with urinary tract infections and issues with mobility.  The patient was then in rehab from 01/12/2020 until 02/15/2020.  The patient was discharged to _.   Through the course of the patient's rehab, there was little improvement in the patient rarely participated in rehab.  The patient cannot ambulate on his own.  The patient was seen by Dr. Earlie Server on 01/03/2021 who discussed with the patient that he is stage IV non-small cell lung cancer, adenocarcinoma.  Dr. Earlie Server recommended that the patient complete the staging work-up with a PET scan which has not been performed at this time.  The patient is transportation dependent due to his SNF placement.  Dr. Earlie Server discussed that his treatment would likely consist of chemo/immunotherapy versus palliative care and hospice.   Also in the interval since he was last seen, the patient completed palliative radiotherapy to the _under the care of Dr. Sondra Come.   Today, the patient continues to have ongoing left-sided chest pain, shortness of breath, and cough. ***He  is here today for evaluation and more detailed discussion about his current condition and treatment options.    MEDICAL HISTORY: Past Medical History:  Diagnosis Date   Allergic rhinitis    NEC   Asthma    Glaucoma    Lung cancer, main bronchus ([facility]) 12/2019   Tobacco use disorder 09/24/2019    ALLERGIES:  is allergic to penicillins, wellbutrin [bupropion], and morphine and related.  MEDICATIONS:  Current Outpatient Medications  Medication Sig Dispense Refill   acetaminophen (TYLENOL) 325 MG tablet Place 1-2 tablets (325-650 mg total) into feeding tube every 4 (four) hours as needed for mild pain.     albuterol (VENTOLIN HFA) 108 (90 Base) MCG/ACT inhaler Inhale 2 puffs into the lungs every 6 (six) hours as needed for wheezing or shortness of breath.     aspirin 81 MG chewable tablet Place 1 tablet (81 mg total) into feeding tube daily.     bisacodyl (DULCOLAX) 10 MG suppository Place 1 suppository (10 mg total) rectally daily after supper. 12 suppository 0   citalopram (CELEXA) 20 MG tablet Place 1 tablet (20 mg total) into feeding tube daily.     cyclobenzaprine (FLEXERIL) 10 MG tablet Take 1 tablet (10 mg total) by mouth 3 (three) times daily as needed for muscle spasms. 30 tablet 0   cyclobenzaprine (FLEXERIL) 10 MG tablet Place 1 tablet (10 mg total) into feeding tube 3 (three) times daily as needed for muscle spasms. 30 tablet 0   diclofenac Sodium (VOLTAREN) 1 % GEL Apply 1 application topically 4 (four) times daily as needed (pain).      docusate (COLACE) 50  MG/5ML liquid Place 10 mLs (100 mg total) into feeding tube 3 (three) times daily. 100 mL 0   fludrocortisone (FLORINEF) 0.1 MG tablet Place 1 tablet (0.1 mg total) into feeding tube daily.     heparin 5000 UNIT/ML injection Inject 1 mL (5,000 Units total) into the skin every 8 (eight) hours. 1 mL    hydrocerin (EUCERIN) CREA Apply 1 application topically 2 (two) times daily.  0    HYDROcodone-acetaminophen (NORCO/VICODIN) 5-325 MG tablet Place 1 tablet into feeding tube every 6 (six) hours as needed for severe pain (when tramadol doesn't work). 6 tablet 0   ipratropium (ATROVENT) 0.06 % nasal spray Place 2 sprays into both nostrils 3 (three) times daily. 15 mL 12   lidocaine (LIDODERM) 5 % Place 3 patches onto the skin daily. Remove & Discard patch within 12 hours or as directed by MD 30 patch 0   lidocaine (XYLOCAINE) 2 % jelly 1 application by Other route daily.     melatonin 3 MG TABS tablet Take 1 tablet (3 mg total) by mouth at bedtime.  0   melatonin 3 MG TABS tablet Place 1 tablet (3 mg total) into feeding tube at bedtime.  0   midodrine (PROAMATINE) 5 MG tablet Place 1 tablet (5 mg total) into feeding tube 3 (three) times daily with meals.     Multiple Vitamin (MULTIVITAMIN WITH MINERALS) TABS tablet Take 1 tablet by mouth daily.     Multiple Vitamin (MULTIVITAMIN WITH MINERALS) TABS tablet Place 1 tablet into feeding tube daily.     Nutritional Supplements (FEEDING SUPPLEMENT, OSMOLITE 1.5 CAL,) LIQD Place 1,000 mLs into feeding tube continuous.  0   Nutritional Supplements (FEEDING SUPPLEMENT, PROSOURCE TF,) liquid Place 45 mLs into feeding tube 2 (two) times daily.     pantoprazole sodium (PROTONIX) 40 mg/20 mL PACK Place 20 mLs (40 mg total) into feeding tube daily. 30 mL    prochlorperazine (COMPAZINE) 10 MG/2ML injection Inject 1-2 mLs (5-10 mg total) into the muscle every 6 (six) hours as needed. 240 mL    prochlorperazine (COMPAZINE) 25 MG suppository Place 0.5 suppositories (12.5 mg total) rectally every 6 (six) hours as needed for nausea. 12 suppository 0   psyllium (HYDROCIL/METAMUCIL) 95 % PACK Take 1 packet by mouth daily. 240 each    QUEtiapine (SEROQUEL) 25 MG tablet Place 1 tablet (25 mg total) into feeding tube at bedtime as needed (agitation/insomnia). 5 tablet 0   senna (SENOKOT) 8.6 MG TABS tablet Place 2 tablets (17.2 mg total) into  feeding tube daily. 120 tablet 0   SYMBICORT 160-4.5 MCG/ACT inhaler Inhale 1 puff into the lungs 2 (two) times daily as needed (shortness of breath).      timolol (TIMOPTIC) 0.5 % ophthalmic solution Place 1 drop into both eyes 2 (two) times daily.      traMADol (ULTRAM) 50 MG tablet Place 1 tablet (50 mg total) into feeding tube 4 (four) times daily as needed for moderate pain. 10 tablet 0   Travoprost, BAK Free, (TRAVATAN) 0.004 % SOLN ophthalmic solution Place 1 drop into both eyes at bedtime.      traZODone (DESYREL) 100 MG tablet Place 1 tablet (100 mg total) into feeding tube at bedtime.     Water For Irrigation, Sterile (FREE WATER) SOLN Place 200 mLs into feeding tube 4 (four) times daily - after meals and at bedtime.     No current facility-administered medications for this visit.    SURGICAL HISTORY:  Past Surgical History:  Procedure Laterality Date   COLON SURGERY     ESOPHAGOGASTRODUODENOSCOPY (EGD) WITH PROPOFOL  02/09/2020   Procedure: ESOPHAGOGASTRODUODENOSCOPY (EGD) WITH PROPOFOL;  Surgeon: Georganna Skeans, MD;  Location: [facility];  Service: General;;   EYE SURGERY Right    LAMINECTOMY WITH POSTERIOR LATERAL ARTHRODESIS LEVEL 4 N/A 12/30/2019   Procedure: THORACIC THREE LAMINECTOMY, THORACIC INSTRUMENTATION AND FUSION THORACIC ONE-FIVE;  Surgeon: Newman Pies, MD;  Location: [facility];  Service: Neurosurgery;  Laterality: N/A;   PEG PLACEMENT N/A 02/09/2020   Procedure: PERCUTANEOUS ENDOSCOPIC GASTROSTOMY (PEG) PLACEMENT;  Surgeon: Georganna Skeans, MD;  Location: [facility];  Service: General;  Laterality: N/A;    REVIEW OF SYSTEMS:   Review of Systems  Constitutional: Negative for appetite change, chills, fatigue, fever and unexpected weight change.  HENT:   Negative for mouth sores, nosebleeds, sore throat and trouble swallowing.   Eyes: Negative for eye problems and icterus.  Respiratory: Negative for cough, hemoptysis, shortness of breath and wheezing.    Cardiovascular: Negative for chest pain and leg swelling.  Gastrointestinal: Negative for abdominal pain, constipation, diarrhea, nausea and vomiting.  Genitourinary: Negative for bladder incontinence, difficulty urinating, dysuria, frequency and hematuria.   Musculoskeletal: Negative for back pain, gait problem, neck pain and neck stiffness.  Skin: Negative for itching and rash.  Neurological: Negative for dizziness, extremity weakness, gait problem, headaches, light-headedness and seizures.  Hematological: Negative for adenopathy. Does not bruise/bleed easily.  Psychiatric/Behavioral: Negative for confusion, depression and sleep disturbance. The patient is not nervous/anxious.     PHYSICAL EXAMINATION:  There were no vitals taken for this visit.  ECOG PERFORMANCE STATUS: {CHL ONC ECOG [id number]  Physical Exam  Constitutional: Oriented to person, place, and time and well-developed, well-nourished, and in no distress. No distress.  HENT:  Head: Normocephalic and atraumatic.  Mouth/Throat: Oropharynx is clear and moist. No oropharyngeal exudate.  Eyes: Conjunctivae are normal. Right eye exhibits no discharge. Left eye exhibits no discharge. No scleral icterus.  Neck: Normal range of motion. Neck supple.  Cardiovascular: Normal rate, regular rhythm, normal heart sounds and intact distal pulses.   Pulmonary/Chest: Effort normal and breath sounds normal. No respiratory distress. No wheezes. No rales.  Abdominal: Soft. Bowel sounds are normal. Exhibits no distension and no mass. There is no tenderness.  Musculoskeletal: Normal range of motion. Exhibits no edema.  Lymphadenopathy:    No cervical adenopathy.  Neurological: Alert and oriented to person, place, and time. Exhibits normal muscle tone. Gait normal. Coordination normal.  Skin: Skin is warm and dry. No rash noted. Not diaphoretic. No erythema. No pallor.  Psychiatric: Mood, memory and judgment normal.  Vitals  reviewed.  LABORATORY DATA: Lab Results  Component Value Date   WBC 4.7 02/15/2020   HGB 9.6 (L) 02/15/2020   HCT 30.6 (L) 02/15/2020   MCV 88.7 02/15/2020   PLT 238 02/15/2020      Chemistry      Component Value Date/Time   NA 134 (L) 02/15/2020 0541   K 4.6 02/15/2020 0541   CL 97 (L) 02/15/2020 0541   CO2 29 02/15/2020 0541   BUN 16 02/15/2020 0541   CREATININE 0.57 (L) 02/15/2020 0541      Component Value Date/Time   CALCIUM 8.9 02/15/2020 0541   ALKPHOS 59 02/04/2020 1132   AST 22 02/04/2020 1132   ALT 27 02/04/2020 1132   BILITOT 1.0 02/04/2020 1132       RADIOGRAPHIC STUDIES:  CT ABDOMEN WO CONTRAST  Result Date: 02/05/2020 CLINICAL DATA:  Evaluate  gastric anatomy prior to potential percutaneous gastrostomy placement. EXAM: CT ABDOMEN WITHOUT CONTRAST TECHNIQUE: Multidetector CT imaging of the abdomen was performed following the standard protocol without IV contrast. COMPARISON:  Chest CT-12/04/2019 FINDINGS: The lack of intravenous contrast limits the ability to evaluate solid abdominal organs. Lower chest: Limited visualization of the lower thorax demonstrates minimal dependent subpleural ground-glass atelectasis. No discrete focal airspace opacities. No pleural effusion Normal heart size. No pericardial effusion. There is mild decreased attenuation of the intra cardiac blood pool suggestive of anemia. Hepatobiliary: Normal hepatic contour. Normal noncontrast appearance of the gallbladder given degree of distention. No radiopaque gallstones. No ascites. Pancreas: Normal noncontrast appearance of the pancreas. Spleen: Normal noncontrast appearance of the spleen. Adrenals/Urinary Tract: There is a large (approximately 8.5 x 7.9 x 7.6 cm peripherally calcified mixed attenuating mass arising from the anterior aspect of the interpolar aspect of the right kidney (axial image 31, series 3; coronal image 50, series 6). Additional punctate (sub 1.1 cm) hypoattenuating renal  lesions are seen bilaterally, incompletely evaluated though favored to represent renal cysts. Note is made of an approximately 0.8 x 0.6 cm nonobstructing stone within the right renal pelvis (axial image 25, series 3; coronal image 62, series 6). There is a punctate (approximately 2 mm) nonobstructing stone within the anterior interpolar aspect the left kidney (axial image 25, series 3). No definitive renal stones are seen along the imaged course of the superior aspect of the bilateral ureters. Very minimal amount of symmetric bilateral perinephric stranding. No evidence of urinary obstruction. There is mild thickening of the lateral limb of the left adrenal gland without discrete nodule. Normal noncontrast appearance of the right adrenal gland. The urinary bladder was not imaged. Stomach/Bowel: Postoperative change of the GE junction as well as the anterior aspect of the gastric antrum potentially the sequela of previous gastrojejunostomy creation though a distal anastomosis was not imaged. Ingested enteric contrast is seen within the colon. No evidence of enteric obstruction. The anterior wall of the gastric antrum is otherwise well apposed against the ventral wall of the upper abdomen without interposition either the colon or liver (axial image 20, series 3). No pneumoperitoneum, pneumatosis or portal venous gas. Vascular/Lymphatic: Atherosclerotic plaque within a normal caliber abdominal aorta. No bulky retroperitoneal or mesenteric lymphadenopathy on this noncontrast examination. Other: Dermal calcification overlies the ventral aspect of the upper abdomen. Regional soft tissues appear otherwise normal. Musculoskeletal: No acute or aggressive osseous abnormalities. Mild-to-moderate multilevel lumbar spine DDD, worse at L3-L4 and L4-L5 with disc space height loss, endplate irregularity and small posteriorly directed disc osteophyte complexes at these locations. IMPRESSION: 1. Postoperative change of the GE  junction as well as the anterior aspect of the gastric antrum which may represent the sequela of previous gastrojejunostomy. Correlation with previous operative reports is advised as if the patient is post gastrojejunostomy creation he would NOT be a candidate for percutaneous gastrostomy tube placement. If operative reports are not available, further evaluation with upper GI series could be performed as indicated. 2. Gastric anatomy otherwise amenable to percutaneous gastrostomy tube placement as indicated. 3. Incidentally noted approximately 8.5 cm mixed attenuating mass arising from the anterior aspect of the interpolar aspect of the right kidney - while potentially representative of a complex renal cyst, further evaluation with contrast-enhanced abdominal MRI is advised. 4. Nonobstructing bilateral nephrolithiasis. 5. Aortic Atherosclerosis (ICD10-I70.0). Electronically Signed   By: Sandi Mariscal M.D.   On: 02/05/2020 08:09   CT HEAD WO CONTRAST  Result Date: 02/04/2020 CLINICAL DATA:  Acute loss of vision right eye. History of lung carcinoma EXAM: CT HEAD WITHOUT CONTRAST TECHNIQUE: Contiguous axial images were obtained from the base of the skull through the vertex without intravenous contrast. COMPARISON:  None. FINDINGS: Brain: There is mild age related volume loss. There is no intracranial mass, hemorrhage, extra-axial fluid collection, or midline shift. Decreased attenuation is noted in the anterior limb of each external capsule, likely due to prior lacunar type infarcts in these areas. No acute appearing infarct is evident. Vascular: No hyperdense vessel. There is calcification in each carotid siphon region. Skull: Bony calvarium appears intact. Sinuses/Orbits: There is mucosal thickening in several ethmoid air cells. Other visualized paranasal sinuses are clear. Orbits appear symmetric bilaterally. Other: Mastoid air cells are clear. 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 No acute appearing infarct is evident. No mass or hemorrhage. There are foci of arterial vascular calcification. There is mucosal thickening in several ethmoid air cells. Electronically Signed   By: Lowella Grip III M.D.   On: 02/04/2020 12:54   MR BRAIN W WO CONTRAST  Result Date: 02/05/2020 CLINICAL DATA:  Stroke suspected.  Vision loss. EXAM: MRI HEAD WITHOUT AND WITH CONTRAST TECHNIQUE: Multiplanar, multiecho pulse sequences of the brain and surrounding structures were obtained without and with intravenous contrast. CONTRAST:  6.36m GADAVIST GADOBUTROL 1 MMOL/ML IV SOLN COMPARISON:  CT head from the same day FINDINGS: Brain: No acute infarction, hemorrhage, hydrocephalus, extra-axial collection or mass lesion. 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 No abnormal enhancement. Vascular: Major arterial flow voids are maintained at the skull base. Skull and upper cervical spine: Normal marrow signal. Right frontal scalp lipoma. Sinuses/Orbits: No substantial paranasal sinus disease. Unremarkable orbits. Other: No mastoid effusions. IMPRESSION: No acute abnormality.  Specifically, no acute infarct. Electronically Signed   By: FMargaretha SheffieldMD   On: 02/05/2020 20:29   MR ABDOMEN W WO CONTRAST  Result Date: 02/08/2020 CLINICAL DATA:  Right renal mass on CT.  Left-sided lung cancer. EXAM: MRI ABDOMEN WITHOUT AND WITH CONTRAST TECHNIQUE: Multiplanar multisequence MR imaging of the abdomen was performed both before and after the administration of intravenous contrast. CONTRAST:  6.569mGADAVIST GADOBUTROL 1 MMOL/ML IV SOLN COMPARISON:  Abdominal CT 02/04/2020. FINDINGS: Portions of exam are mildly motion degraded. Lower chest: Normal heart size without pericardial or pleural effusion. Hepatobiliary: Normal liver. Normal gallbladder, without biliary ductal dilatation. Pancreas: The pancreatic duct is  upper normal for age. No mass or acute inflammation. Spleen:  Normal in size, without focal abnormality. Adrenals/Urinary Tract: Left greater than right adrenal thickening. Bilateral simple renal cysts. Corresponding to the CT abnormality, within the anterior lower pole right kidney 7.5 x 7.5 by 7.7 cm mass which demonstrates precontrast T2 hypointensity and T1 hyperintensity. No post-contrast enhancement, including on subtracted images. No hydronephrosis. Stomach/Bowel: Normal stomach and small bowel. Colonic stool burden suggests constipation. Vascular/Lymphatic: Aortic atherosclerosis. No retroperitoneal or retrocrural adenopathy. Other:  No ascites. Musculoskeletal: No acute osseous abnormality. IMPRESSION: 1. Mildly motion degraded exam. 2. Dominant lower pole right renal mass is consistent with a hemorrhagic/proteinaceous cyst. 3. No acute process or evidence of metastatic disease in the abdomen. 4.  Aortic Atherosclerosis (ICD10-I70.0). Electronically Signed   By: KyAbigail Miyamoto.D.   On: 02/08/2020 13:47   DG UGI W SINGLE CM (SOL OR THIN BA)  Result Date: 02/05/2020 CLINICAL DATA:  Abnormal CT. EXAM: WATER SOLUBLE UPPER GI SERIES TECHNIQUE: Single-column upper GI  series was performed using water soluble contrast. CONTRAST:  33m OMNIPAQUE IOHEXOL 300 MG/ML  SOLN COMPARISON:  CT scan 02/04/2020. FLUOROSCOPY TIME:  Fluoroscopy Time:  2 minutes and 48 seconds. Radiation Exposure Index (if provided by the fluoroscopic device): 14.2 mGy Number of Acquired Spot Images: 0 FINDINGS: Study is markedly limited by difficulty with patient positioning and piecemeal swallowing. Within this limitation, there is no gross esophageal abnormality. Surgical clips in the region of the esophagogastric junction or probably related to prior fundoplication. Contrast moves freely from the esophagus into the stomach although there is poor esophageal peristalsis. Fluoro table was pushes in 30 degrees head up in the patient was  rotated in both obliques, but the gastrojejunostomy seen on CT scan earlier today is anterior and the Roux limb could not be opacified given limited stomach filling and inability to position the patient upright. IMPRESSION: 1. No evidence for obstruction at the gastroesophageal junction. 2. 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 Repeat upper GI series could be performed after patient recovers from acute symptoms (preferably as an outpatient) and is better able to participate with drinking contrast and upright positioning. Electronically Signed   By: EMisty StanleyM.D.   On: 02/05/2020 13:56   DG Swallowing Func-Speech Pathology  Result Date: 01/29/2020 Objective Swallowing Evaluation: Type of Study: MBS-Modified Barium Swallow Study  Patient Details Name: HRector DevonshireMRN: [id number] of Birth: [birth year]Today's Date: 01/29/2020 Time: No data recorded-No data recorded No data recorded Past Medical History: Past Medical History: Diagnosis Date  Allergic rhinitis   NEC  Asthma   Glaucoma   Lung cancer, main bronchus ([facility] 12/2019  Tobacco use disorder 09/24/2019 Past Surgical History: Past Surgical History: Procedure Laterality Date  COLON SURGERY    EYE SURGERY Right   LAMINECTOMY WITH POSTERIOR LATERAL ARTHRODESIS LEVEL 4 N/A 12/30/2019  Procedure: THORACIC THREE LAMINECTOMY, THORACIC INSTRUMENTATION AND FUSION THORACIC ONE-FIVE;  Surgeon: JNewman Pies MD;  Location: [facility]  Service: Neurosurgery;  Laterality: N/A; HPI: see H&P  No data recorded Assessment / Plan / Recommendation CHL IP CLINICAL IMPRESSIONS 01/29/2020 Clinical Impression Patient presents with a moderate oral and mild pharyngeal dysphagia. Oral phase of swallow consists of delayed anterior to posterior transit of all boluses (heavier boluses leading to longer delays), decreased bolus cohesion and piecemeal swallowing with mechanical soft solids, decreased  mastication of mechanical soft solids. Pharyngeal phase consisted of minimal instances of delays of swallow initiation to vallecular sinus with thin liquids, mild amount of pyriform residuals and trace to mild vallecular sinus residuals with thin liquids,mechanical soft solids. 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 No penetration or aspiration events observed. SLP Visit Diagnosis Dysphagia, oropharyngeal phase (R13.12) Attention and concentration deficit following -- Frontal lobe and executive function deficit following -- Impact on safety and function No limitations   No flowsheet data found.  No flowsheet data found. CHL IP DIET RECOMMENDATION 01/29/2020 SLP Diet Recommendations Dysphagia 1 (Puree) solids;Thin liquid Liquid Administration via Cup;Straw Medication Administration Whole meds with puree Compensations Minimize environmental distractions;Slow rate;Small sips/bites;Follow solids with liquid Postural Changes Remain semi-upright after after feeds/meals (Comment)   CHL IP OTHER RECOMMENDATIONS 01/29/2020 Recommended Consults -- Oral Care Recommendations Oral care BID Other Recommendations --   CHL IP FOLLOW UP RECOMMENDATIONS 01/29/2020 Follow up Recommendations None   No flowsheet data found.     CHL IP ORAL PHASE 01/29/2020 Oral Phase  Impaired Oral - Pudding Teaspoon -- Oral - Pudding Cup -- Oral - Honey Teaspoon -- Oral - Honey Cup -- Oral - Nectar Teaspoon -- Oral - Nectar Cup -- Oral - Nectar Straw -- Oral - Thin Teaspoon -- Oral - Thin Cup Reduced posterior propulsion;Delayed oral transit Oral - Thin Straw -- Oral - Puree Piecemeal swallowing;Reduced posterior propulsion;Delayed oral transit Oral - Mech Soft Impaired mastication;Weak lingual manipulation;Piecemeal swallowing;Delayed oral transit;Reduced posterior propulsion;Holding of bolus Oral - Regular -- Oral - Multi-Consistency -- Oral - Pill Decreased bolus cohesion;Reduced posterior  propulsion;Weak lingual manipulation;Delayed oral transit Oral Phase - Comment --  CHL IP PHARYNGEAL PHASE 01/29/2020 Pharyngeal Phase Impaired Pharyngeal- Pudding Teaspoon -- Pharyngeal -- Pharyngeal- Pudding Cup -- Pharyngeal -- Pharyngeal- Honey Teaspoon -- Pharyngeal -- Pharyngeal- Honey Cup -- Pharyngeal -- Pharyngeal- Nectar Teaspoon -- Pharyngeal -- Pharyngeal- Nectar Cup -- Pharyngeal -- Pharyngeal- Nectar Straw -- Pharyngeal -- Pharyngeal- Thin Teaspoon -- Pharyngeal -- Pharyngeal- Thin Cup Delayed swallow initiation-vallecula;Pharyngeal residue - pyriform Pharyngeal -- Pharyngeal- Thin Straw Delayed swallow initiation-vallecula Pharyngeal -- Pharyngeal- Puree WFL Pharyngeal -- Pharyngeal- Mechanical Soft Pharyngeal residue - valleculae Pharyngeal -- Pharyngeal- Regular -- Pharyngeal -- Pharyngeal- Multi-consistency -- Pharyngeal -- Pharyngeal- Pill WFL Pharyngeal -- Pharyngeal Comment --  CHL IP CERVICAL ESOPHAGEAL PHASE 01/29/2020 Cervical Esophageal Phase Impaired Pudding Teaspoon -- Pudding Cup -- Honey Teaspoon -- Honey Cup -- Nectar Teaspoon -- Nectar Cup -- Nectar Straw -- Thin Teaspoon -- Thin Cup -- Thin Straw -- Puree Prominent cricopharyngeal segment Mechanical Soft Prominent cricopharyngeal segment Regular -- Multi-consistency -- Pill -- Cervical Esophageal Comment -- Sonia Baller, MA, CCC-SLP Speech Therapy             ECHOCARDIOGRAM COMPLETE  Result Date: 02/06/2020    ECHOCARDIOGRAM REPORT   Patient Name:   Gregory Bautista Date of Exam: 02/06/2020 Medical Rec #:  [id number]         Height:       69.0 in Accession #:    [id number]        Weight:       136.5 lb Date of Birth:  [birth year]         BSA:          1.756 m Patient Age:    6 years          BP:           99/68 mmHg Patient Gender: M                 HR:           110 bpm. Exam Location:  Inpatient Procedure: 2D Echo Indications:   stroke 434.91  History:       Patient has no prior history of Echocardiogram examinations.                 Cancer.  Sonographer:   Johny Chess Referring      [id number] Rosalin Hawking Phys: IMPRESSIONS  1. Left ventricular ejection fraction, by estimation, is 55 to 60%. The left ventricle has normal function. The left ventricle has no regional wall motion abnormalities. Left ventricular diastolic parameters are consistent with Grade I diastolic dysfunction (impaired relaxation).  2. Right ventricular systolic function is normal. The right ventricular size is normal.  3. The mitral valve is normal in structure. No evidence of mitral valve regurgitation. No evidence of mitral stenosis.  4. The aortic valve is tricuspid. Aortic valve regurgitation is not visualized. Mild to moderate aortic valve  sclerosis/calcification is present, without any evidence of aortic stenosis.  5. The inferior vena cava is normal in size with greater than 50% respiratory variability, suggesting right atrial pressure of 3 mmHg. FINDINGS  Left Ventricle: Left ventricular ejection fraction, by estimation, is 55 to 60%. The left ventricle has normal function. The left ventricle has no regional wall motion abnormalities. The left ventricular internal cavity size was normal in size. There is  no left ventricular hypertrophy. Left ventricular diastolic parameters are consistent with Grade I diastolic dysfunction (impaired relaxation). Right Ventricle: The right ventricular size is normal. No increase in right ventricular wall thickness. Right ventricular systolic function is normal. Left Atrium: Left atrial size was normal in size. Right Atrium: Right atrial size was normal in size. Pericardium: There is no evidence of pericardial effusion. Mitral Valve: The mitral valve is normal in structure. No evidence of mitral valve regurgitation. No evidence of mitral valve stenosis. Tricuspid Valve: The tricuspid valve is normal in structure. Tricuspid valve regurgitation is mild . No evidence of tricuspid stenosis. Aortic Valve: The aortic valve is tricuspid.  Aortic valve regurgitation is not visualized. Mild to moderate aortic valve sclerosis/calcification is present, without any evidence of aortic stenosis. Pulmonic Valve: The pulmonic valve was normal in structure. Pulmonic valve regurgitation is not visualized. No evidence of pulmonic stenosis. Aorta: The aortic root is normal in size and structure. Venous: The inferior vena cava is normal in size with greater than 50% respiratory variability, suggesting right atrial pressure of 3 mmHg. IAS/Shunts: No atrial level shunt detected by color flow Doppler.  LEFT VENTRICLE PLAX 2D LVIDd:         3.80 cm  Diastology LVIDs:         2.50 cm  LV e' medial:    5.98 cm/s LV PW:         0.90 cm  LV E/e' medial:  10.1 LV IVS:        0.70 cm  LV e' lateral:   7.94 cm/s LVOT diam:     2.00 cm  LV E/e' lateral: 7.6 LV SV:         45 LV SV Index:   26 LVOT Area:     3.14 cm  RIGHT VENTRICLE             IVC RV S prime:     13.60 cm/s  IVC diam: 1.20 cm TAPSE (M-mode): 2.1 cm LEFT ATRIUM           Index      RIGHT ATRIUM          Index LA diam:      2.40 cm 1.37 cm/m RA Area:     6.41 cm LA Vol (A4C): 15.9 ml 9.05 ml/m RA Volume:   9.99 ml  5.69 ml/m  AORTIC VALVE LVOT Vmax:   79.60 cm/s LVOT Vmean:  55.200 cm/s LVOT VTI:    0.144 m  AORTA Ao Root diam: 3.10 cm MV E velocity: 60.40 cm/s MV A velocity: 96.40 cm/s  SHUNTS MV E/A ratio:  0.63        Systemic VTI:  0.14 m                            Systemic Diam: 2.00 cm Jenkins Rouge MD Electronically signed by Jenkins Rouge MD Signature Date/Time: 02/06/2020/12:03:31 PM    Final    CT ANGIO HEAD CODE STROKE  Result Date: 02/05/2020 CLINICAL DATA:  Neuro deficits, subacute. EXAM: CT ANGIOGRAPHY HEAD AND NECK TECHNIQUE: Multidetector CT imaging of the head and neck was performed using the standard protocol during bolus administration of intravenous contrast. Multiplanar CT image reconstructions and MIPs were obtained to evaluate the vascular anatomy. Carotid stenosis measurements  (when applicable) are obtained utilizing NASCET criteria, using the distal internal carotid diameter as the denominator. CONTRAST:  74m OMNIPAQUE IOHEXOL 350 MG/ML SOLN COMPARISON:  CT head February 04, 2019.  Same day MRI. FINDINGS: CT HEAD FINDINGS Brain: No evidence of acute large vascular territory infarction, hemorrhage, hydrocephalus, extra-axial collection or mass lesion/mass effect. 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 Vascular: Calcific atherosclerosis. Skull: No acute fracture. Sinuses: Sinuses are largely clear.  Unremarkable orbits. Orbits: No mastoid effusions. Review of the MIP images confirms the above findings CTA NECK FINDINGS Aortic arch: Atherosclerosis of the aorta. The right subclavian artery origin is not imaged. Otherwise, great vessel origins appear patent. 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 Right carotid system: No evidence of dissection, stenosis (50% or greater) or occlusion. Left carotid system: No evidence of dissection, stenosis (50% or greater) or occlusion. Mild narrowing of the common carotid artery origin. Mild atherosclerosis at the carotid bifurcation. The proximal common carotid artery abuts the anterior aspect of the left lung apex mass. Vertebral arteries: Left dominant. 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 Otherwise, no significant stenosis in the neck. Skeleton: Partially imaged thoracic fusion hardware. There is extensive invasion of the partially imaged upper thoracic spine by the left upper lung mass. Other neck: Normal thyroid. Upper chest: Large mass involving the left lung apex, which invades into the adjacent thoracic spine and mediastinum. The size and overall extent of the tumor appears progressed since prior CT chest from December 04, 2019. Review of the MIP images confirms the above  findings CTA HEAD FINDINGS Anterior circulation: No significant proximal stenosis, large vessel occlusion, aneurysm, or vascular malformation. Bilateral calcific cavernous carotid artery atherosclerosis without evidence of greater than 50% narrowing. Posterior circulation: No significant proximal stenosis, large vessel occlusion, aneurysm, or vascular malformation. The non dominant right vertebral artery makes a small contribution to the basilar artery. Venous sinuses: As permitted by contrast timing, patent. Review of the MIP images confirms the above findings IMPRESSION: 1. No emergent large vessel occlusion. 2. 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 3. 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 The size and overall extent of the tumor appears progressed since prior CT chest from December 04, 2019. CT of the chest with contrast could further characterize lung/mediastinal progression and MRI of the thoracic spine with contrast could further characterize thoracic (including canal) invasion if clinically indicated. Electronically Signed   By: FMargaretha SheffieldMD   On: 02/05/2020 21:34   CT ANGIO NECK CODE STROKE  Result Date: 02/05/2020 CLINICAL DATA:  Neuro deficits, subacute. EXAM: CT ANGIOGRAPHY HEAD AND NECK TECHNIQUE: Multidetector CT imaging of the head and neck was performed using the standard protocol during bolus administration of intravenous contrast. Multiplanar CT image reconstructions and MIPs were obtained to evaluate the vascular anatomy. Carotid stenosis measurements (when applicable) are obtained utilizing NASCET criteria, using the distal internal carotid diameter as the denominator. CONTRAST:  733mOMNIPAQUE IOHEXOL 350 MG/ML SOLN  COMPARISON:  CT head February 04, 2019.  Same day MRI. FINDINGS: CT HEAD FINDINGS Brain: No evidence of acute large  vascular territory infarction, hemorrhage, hydrocephalus, extra-axial collection or mass lesion/mass effect. 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 Vascular: Calcific atherosclerosis. Skull: No acute fracture. Sinuses: Sinuses are largely clear.  Unremarkable orbits. Orbits: No mastoid effusions. Review of the MIP images confirms the above findings CTA NECK FINDINGS Aortic arch: Atherosclerosis of the aorta. The right subclavian artery origin is not imaged. Otherwise, great vessel origins appear patent. 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 Right carotid system: No evidence of dissection, stenosis (50% or greater) or occlusion. Left carotid system: No evidence of dissection, stenosis (50% or greater) or occlusion. Mild narrowing of the common carotid artery origin. Mild atherosclerosis at the carotid bifurcation. The proximal common carotid artery abuts the anterior aspect of the left lung apex mass. Vertebral arteries: Left dominant. 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 Otherwise, no significant stenosis in the neck. Skeleton: Partially imaged thoracic fusion hardware. There is extensive invasion of the partially imaged upper thoracic spine by the left upper lung mass. Other neck: Normal thyroid. Upper chest: Large mass involving the left lung apex, which invades into the adjacent thoracic spine and mediastinum. The size and overall extent of the tumor appears progressed since prior CT chest from December 04, 2019. Review of the MIP images confirms the above findings CTA HEAD FINDINGS Anterior circulation: No significant proximal stenosis, large vessel occlusion, aneurysm, or vascular malformation. Bilateral calcific cavernous carotid artery atherosclerosis without evidence of greater than 50% narrowing. Posterior circulation: No significant  proximal stenosis, large vessel occlusion, aneurysm, or vascular malformation. The non dominant right vertebral artery makes a small contribution to the basilar artery. Venous sinuses: As permitted by contrast timing, patent. Review of the MIP images confirms the above findings IMPRESSION: 1. No emergent large vessel occlusion. 2. 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 3. 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 The size and overall extent of the tumor appears progressed since prior CT chest from December 04, 2019. CT of the chest with contrast could further characterize lung/mediastinal progression and MRI of the thoracic spine with contrast could further characterize thoracic (including canal) invasion if clinically indicated. 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  The mass abuts the superior aspect of the aortic arch and left posterior wall of the upper esophagus.  There was also erosive changes of the T2, T3 and T4 vertebrae due to infiltration of the left upper lobe mass.  There are destructive changes of the posterior left third and fourth ribs and probably extension of the mass into the left T3-T4 neural foramina and central canal at T3 and T4 level.  Patient's PD-L1 expression is 1%.  The patient is no actionable mutations.  The patient was diagnosed in December 2021.  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 Arnoldo Morale on 12/30/2019.   The patient then completed palliative radiotherapy  under the care of Dr. Sondra Come.  This was completed on 02/11/2020.  The course of treatment was cut short due to the patient being transportation dependent.  The patient was seen with Dr. Julien Nordmann today.  Dr. Earlie Server had a lengthy discussion with the patient today about his current condition and recommended treatment options.       No orders of the defined types were placed in this encounter.    I spent {CHL ONC TIME VISIT - KKDPT:[id number]} counseling the patient face to face. The total time spent in the appointment was {CHL ONC TIME VISIT - UPBDH:[id number]}.  Joesiah Lonon L Juanna Pudlo, PA-C 02/16/20

## 2020-02-17 ENCOUNTER — Ambulatory Visit: Payer: Medicare Other | Admitting: Physician Assistant

## 2020-02-17 ENCOUNTER — Telehealth: Payer: Self-pay | Admitting: Physician Assistant

## 2020-02-17 ENCOUNTER — Other Ambulatory Visit: Payer: Medicare Other

## 2020-02-17 ENCOUNTER — Ambulatory Visit: Payer: Medicare Other

## 2020-02-17 NOTE — Telephone Encounter (Signed)
I received a call from [facility] where the pt is a current resident to r/s his appt to 1/27 at 1pm to see Cassie and labs at 1230pm.

## 2020-02-18 ENCOUNTER — Other Ambulatory Visit: Payer: Medicare Other

## 2020-02-18 ENCOUNTER — Telehealth: Payer: Self-pay

## 2020-02-18 ENCOUNTER — Ambulatory Visit: Payer: Medicare Other

## 2020-02-18 ENCOUNTER — Ambulatory Visit: Payer: Medicare Other | Admitting: Physician Assistant

## 2020-02-18 NOTE — Telephone Encounter (Signed)
I spoke with pts brother who advised the pt will not be able to present for his 02/18/20 lab and OV. He states the family is still trying to work out a Market researcher.  I also reminded the pts brother that the pt also has a PET scan scheduled for 03/01/20. He expressed understanding of this information.

## 2020-02-18 NOTE — Progress Notes (Deleted)
[facility] OFFICE PROGRESS NOTE  [name], MD [place] [address]  DIAGNOSIS: ***  PRIOR THERAPY:  CURRENT THERAPY:  INTERVAL HISTORY: Gregory Bautista 80 y.o. male returns for *** regular *** visit for followup of ***   MEDICAL HISTORY: Past Medical History:  Diagnosis Date  . Allergic rhinitis    NEC  . Asthma   . Glaucoma   . 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 Tobacco use disorder 09/24/2019    ALLERGIES:  is allergic to penicillins, wellbutrin [bupropion], and morphine and related.  MEDICATIONS:  Current Outpatient Medications  Medication Sig Dispense Refill  . acetaminophen (TYLENOL) 325 MG tablet Place 1-2 tablets (325-650 mg total) into feeding tube every 4 (four) hours as needed for mild pain.    [name] albuterol (VENTOLIN HFA) 108 (90 Base) MCG/ACT inhaler Inhale 2 puffs into the lungs every 6 (six) hours as needed for wheezing or shortness of breath.    [name] aspirin 81 MG chewable tablet Place 1 tablet (81 mg total) into feeding tube daily.    . bisacodyl (DULCOLAX) 10 MG suppository Place 1 suppository (10 mg total) rectally daily after supper. 12 suppository 0  . citalopram (CELEXA) 20 MG tablet Place 1 tablet (20 mg total) into feeding tube daily.    . cyclobenzaprine (FLEXERIL) 10 MG tablet Take 1 tablet (10 mg total) by mouth 3 (three) times daily as needed for muscle spasms. 30 tablet 0  . cyclobenzaprine (FLEXERIL) 10 MG tablet Place 1 tablet (10 mg total) into feeding tube 3 (three) times daily as needed for muscle spasms. 30 tablet 0  . diclofenac Sodium (VOLTAREN) 1 % GEL Apply 1 application topically 4 (four) times daily as needed (pain).     [name] docusate (COLACE) 50 MG/5ML liquid Place 10 mLs (100 mg total) into feeding tube 3 (three) times daily. 100 mL 0  . fludrocortisone (FLORINEF) 0.1 MG tablet Place 1 tablet (0.1 mg total) into feeding tube daily.    . heparin 5000 UNIT/ML injection Inject 1 mL (5,000 Units  total) into the skin every 8 (eight) hours. 1 mL   . hydrocerin (EUCERIN) CREA Apply 1 application topically 2 (two) times daily.  0  . HYDROcodone-acetaminophen (NORCO/VICODIN) 5-325 MG tablet Place 1 tablet into feeding tube every 6 (six) hours as needed for severe pain (when tramadol doesn't work). 6 tablet 0  . ipratropium (ATROVENT) 0.06 % nasal spray Place 2 sprays into both nostrils 3 (three) times daily. 15 mL 12  . lidocaine (LIDODERM) 5 % Place 3 patches onto the skin daily. Remove & Discard patch within 12 hours or as directed by MD 30 patch 0  . lidocaine (XYLOCAINE) 2 % jelly 1 application by Other route daily.    . melatonin 3 MG TABS tablet Take 1 tablet (3 mg total) by mouth at bedtime.  0  . melatonin 3 MG TABS tablet Place 1 tablet (3 mg total) into feeding tube at bedtime.  0  . midodrine (PROAMATINE) 5 MG tablet Place 1 tablet (5 mg total) into feeding tube 3 (three) times daily with meals.    . Multiple Vitamin (MULTIVITAMIN WITH MINERALS) TABS tablet Take 1 tablet by mouth daily.    . Multiple Vitamin (MULTIVITAMIN WITH MINERALS) TABS tablet Place 1 tablet into feeding tube daily.    . Nutritional Supplements (FEEDING SUPPLEMENT, OSMOLITE 1.5 CAL,) LIQD Place 1,000 mLs into feeding tube continuous.  0  . Nutritional Supplements (FEEDING SUPPLEMENT, PROSOURCE  TF,) liquid Place 45 mLs into feeding tube 2 (two) times daily.    . pantoprazole sodium (PROTONIX) 40 mg/20 mL PACK Place 20 mLs (40 mg total) into feeding tube daily. 30 mL   . prochlorperazine (COMPAZINE) 10 MG/2ML injection Inject 1-2 mLs (5-10 mg total) into the muscle every 6 (six) hours as needed. 240 mL   . prochlorperazine (COMPAZINE) 25 MG suppository Place 0.5 suppositories (12.5 mg total) rectally every 6 (six) hours as needed for nausea. 12 suppository 0  . psyllium (HYDROCIL/METAMUCIL) 95 % PACK Take 1 packet by mouth daily. 240 each   . QUEtiapine (SEROQUEL) 25 MG tablet Place 1 tablet (25 mg total) into  feeding tube at bedtime as needed (agitation/insomnia). 5 tablet 0  . senna (SENOKOT) 8.6 MG TABS tablet Place 2 tablets (17.2 mg total) into feeding tube daily. 120 tablet 0  . SYMBICORT 160-4.5 MCG/ACT inhaler Inhale 1 puff into the lungs 2 (two) times daily as needed (shortness of breath).     . timolol (TIMOPTIC) 0.5 % ophthalmic solution Place 1 drop into both eyes 2 (two) times daily.     . traMADol (ULTRAM) 50 MG tablet Place 1 tablet (50 mg total) into feeding tube 4 (four) times daily as needed for moderate pain. 10 tablet 0  . Travoprost, BAK Free, (TRAVATAN) 0.004 % SOLN ophthalmic solution Place 1 drop into both eyes at bedtime.     . traZODone (DESYREL) 100 MG tablet Place 1 tablet (100 mg total) into feeding tube at bedtime.    . Water For Irrigation, Sterile (FREE WATER) SOLN Place 200 mLs into feeding tube 4 (four) times daily - after meals and at bedtime.     No current facility-administered medications for this visit.    SURGICAL HISTORY:  Past Surgical History:  Procedure Laterality Date  . COLON SURGERY    . ESOPHAGOGASTRODUODENOSCOPY (EGD) WITH PROPOFOL  02/09/2020   Procedure: ESOPHAGOGASTRODUODENOSCOPY (EGD) WITH PROPOFOL;  Surgeon: Georganna Skeans, MD;  Location: [facility];  Service: General;;  . EYE SURGERY Right   . LAMINECTOMY WITH POSTERIOR LATERAL ARTHRODESIS LEVEL 4 N/A 12/30/2019   Procedure: THORACIC THREE LAMINECTOMY, THORACIC INSTRUMENTATION AND FUSION THORACIC ONE-FIVE;  Surgeon: Newman Pies, MD;  Location: [facility];  Service: Neurosurgery;  Laterality: N/A;  . PEG PLACEMENT N/A 02/09/2020   Procedure: PERCUTANEOUS ENDOSCOPIC GASTROSTOMY (PEG) PLACEMENT;  Surgeon: Georganna Skeans, MD;  Location: [facility];  Service: General;  Laterality: N/A;    REVIEW OF SYSTEMS:   Review of Systems  Constitutional: Negative for appetite change, chills, fatigue, fever and unexpected weight change.  HENT:   Negative for mouth sores, nosebleeds, sore throat and  trouble swallowing.   Eyes: Negative for eye problems and icterus.  Respiratory: Negative for cough, hemoptysis, shortness of breath and wheezing.   Cardiovascular: Negative for chest pain and leg swelling.  Gastrointestinal: Negative for abdominal pain, constipation, diarrhea, nausea and vomiting.  Genitourinary: Negative for bladder incontinence, difficulty urinating, dysuria, frequency and hematuria.   Musculoskeletal: Negative for back pain, gait problem, neck pain and neck stiffness.  Skin: Negative for itching and rash.  Neurological: Negative for dizziness, extremity weakness, gait problem, headaches, light-headedness and seizures.  Hematological: Negative for adenopathy. Does not bruise/bleed easily.  Psychiatric/Behavioral: Negative for confusion, depression and sleep disturbance. The patient is not nervous/anxious.     PHYSICAL EXAMINATION:  There were no vitals taken for this visit.  ECOG PERFORMANCE STATUS: {CHL ONC ECOG [id number]  Physical Exam  Constitutional: Oriented to person, place, and  time and well-developed, well-nourished, and in no distress. No distress.  HENT:  Head: Normocephalic and atraumatic.  Mouth/Throat: Oropharynx is clear and moist. No oropharyngeal exudate.  Eyes: Conjunctivae are normal. Right eye exhibits no discharge. Left eye exhibits no discharge. No scleral icterus.  Neck: Normal range of motion. Neck supple.  Cardiovascular: Normal rate, regular rhythm, normal heart sounds and intact distal pulses.   Pulmonary/Chest: Effort normal and breath sounds normal. No respiratory distress. No wheezes. No rales.  Abdominal: Soft. Bowel sounds are normal. Exhibits no distension and no mass. There is no tenderness.  Musculoskeletal: Normal range of motion. Exhibits no edema.  Lymphadenopathy:    No cervical adenopathy.  Neurological: Alert and oriented to person, place, and time. Exhibits normal muscle tone. Gait normal. Coordination normal.  Skin:  Skin is warm and dry. No rash noted. Not diaphoretic. No erythema. No pallor.  Psychiatric: Mood, memory and judgment normal.  Vitals reviewed.  LABORATORY DATA: Lab Results  Component Value Date   WBC 4.7 02/15/2020   HGB 9.6 (L) 02/15/2020   HCT 30.6 (L) 02/15/2020   MCV 88.7 02/15/2020   PLT 238 02/15/2020      Chemistry      Component Value Date/Time   NA 134 (L) 02/15/2020 0541   K 4.6 02/15/2020 0541   CL 97 (L) 02/15/2020 0541   CO2 29 02/15/2020 0541   BUN 16 02/15/2020 0541   CREATININE 0.57 (L) 02/15/2020 0541      Component Value Date/Time   CALCIUM 8.9 02/15/2020 0541   ALKPHOS 59 02/04/2020 1132   AST 22 02/04/2020 1132   ALT 27 02/04/2020 1132   BILITOT 1.0 02/04/2020 1132       RADIOGRAPHIC STUDIES:  CT ABDOMEN WO CONTRAST  Result Date: 02/05/2020 CLINICAL DATA:  Evaluate gastric anatomy prior to potential percutaneous gastrostomy placement. EXAM: CT ABDOMEN WITHOUT CONTRAST TECHNIQUE: Multidetector CT imaging of the abdomen was performed following the standard protocol without IV contrast. COMPARISON:  Chest CT-12/04/2019 FINDINGS: The lack of intravenous contrast limits the ability to evaluate solid abdominal organs. Lower chest: Limited visualization of the lower thorax demonstrates minimal dependent subpleural ground-glass atelectasis. No discrete focal airspace opacities. No pleural effusion Normal heart size. No pericardial effusion. There is mild decreased attenuation of the intra cardiac blood pool suggestive of anemia. Hepatobiliary: Normal hepatic contour. Normal noncontrast appearance of the gallbladder given degree of distention. No radiopaque gallstones. No ascites. Pancreas: Normal noncontrast appearance of the pancreas. Spleen: Normal noncontrast appearance of the spleen. Adrenals/Urinary Tract: There is a large (approximately 8.5 x 7.9 x 7.6 cm peripherally calcified mixed attenuating mass arising from the anterior aspect of the interpolar aspect  of the right kidney (axial image 31, series 3; coronal image 50, series 6). Additional punctate (sub 1.1 cm) hypoattenuating renal lesions are seen bilaterally, incompletely evaluated though favored to represent renal cysts. Note is made of an approximately 0.8 x 0.6 cm nonobstructing stone within the right renal pelvis (axial image 25, series 3; coronal image 62, series 6). There is a punctate (approximately 2 mm) nonobstructing stone within the anterior interpolar aspect the left kidney (axial image 25, series 3). No definitive renal stones are seen along the imaged course of the superior aspect of the bilateral ureters. Very minimal amount of symmetric bilateral perinephric stranding. No evidence of urinary obstruction. There is mild thickening of the lateral limb of the left adrenal gland without discrete nodule. Normal noncontrast appearance of the right adrenal gland. The urinary bladder was  not imaged. Stomach/Bowel: Postoperative change of the GE junction as well as the anterior aspect of the gastric antrum potentially the sequela of previous gastrojejunostomy creation though a distal anastomosis was not imaged. Ingested enteric contrast is seen within the colon. No evidence of enteric obstruction. The anterior wall of the gastric antrum is otherwise well apposed against the ventral wall of the upper abdomen without interposition either the colon or liver (axial image 20, series 3). No pneumoperitoneum, pneumatosis or portal venous gas. Vascular/Lymphatic: Atherosclerotic plaque within a normal caliber abdominal aorta. No bulky retroperitoneal or mesenteric lymphadenopathy on this noncontrast examination. Other: Dermal calcification overlies the ventral aspect of the upper abdomen. Regional soft tissues appear otherwise normal. Musculoskeletal: No acute or aggressive osseous abnormalities. Mild-to-moderate multilevel lumbar spine DDD, worse at L3-L4 and L4-L5 with disc space height loss, endplate  irregularity and small posteriorly directed disc osteophyte complexes at these locations. IMPRESSION: 1. Postoperative change of the GE junction as well as the anterior aspect of the gastric antrum which may represent the sequela of previous gastrojejunostomy. Correlation with previous operative reports is advised as if the patient is post gastrojejunostomy creation he would NOT be a candidate for percutaneous gastrostomy tube placement. If operative reports are not available, further evaluation with upper GI series could be performed as indicated. 2. Gastric anatomy otherwise amenable to percutaneous gastrostomy tube placement as indicated. 3. Incidentally noted approximately 8.5 cm mixed attenuating mass arising from the anterior aspect of the interpolar aspect of the right kidney - while potentially representative of a complex renal cyst, further evaluation with contrast-enhanced abdominal MRI is advised. 4. Nonobstructing bilateral nephrolithiasis. 5. Aortic Atherosclerosis (ICD10-I70.0). Electronically Signed   By: Sandi Mariscal M.D.   On: 02/05/2020 08:09   CT HEAD WO CONTRAST  Result Date: 02/04/2020 CLINICAL DATA:  Acute loss of vision right eye. History of lung carcinoma EXAM: CT HEAD WITHOUT CONTRAST TECHNIQUE: Contiguous axial images were obtained from the base of the skull through the vertex without intravenous contrast. COMPARISON:  None. FINDINGS: Brain: There is mild age related volume loss. There is no intracranial mass, hemorrhage, extra-axial fluid collection, or midline shift. Decreased attenuation is noted in the anterior limb of each external capsule, likely due to prior lacunar type infarcts in these areas. No acute appearing infarct is evident. Vascular: No hyperdense vessel. There is calcification in each carotid siphon region. Skull: Bony calvarium appears intact. Sinuses/Orbits: There is mucosal thickening in several ethmoid air cells. Other visualized paranasal sinuses are clear. Orbits  appear symmetric bilaterally. Other: Mastoid air cells are clear. 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 No acute appearing infarct is evident. No mass or hemorrhage. There are foci of arterial vascular calcification. There is mucosal thickening in several ethmoid air cells. Electronically Signed   By: Lowella Grip III M.D.   On: 02/04/2020 12:54   MR BRAIN W WO CONTRAST  Result Date: 02/05/2020 CLINICAL DATA:  Stroke suspected.  Vision loss. EXAM: MRI HEAD WITHOUT AND WITH CONTRAST TECHNIQUE: Multiplanar, multiecho pulse sequences of the brain and surrounding structures were obtained without and with intravenous contrast. CONTRAST:  6.54mL GADAVIST GADOBUTROL 1 MMOL/ML IV SOLN COMPARISON:  CT head from the same day FINDINGS: Brain: No acute infarction, hemorrhage, hydrocephalus, extra-axial collection or mass lesion. 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 No abnormal enhancement. Vascular: Major arterial flow voids are maintained at the skull base. Skull and upper cervical spine: Normal  marrow signal. Right frontal scalp lipoma. Sinuses/Orbits: No substantial paranasal sinus disease. Unremarkable orbits. Other: No mastoid effusions. IMPRESSION: No acute abnormality.  Specifically, no acute infarct. Electronically Signed   By: Margaretha Sheffield MD   On: 02/05/2020 20:29   MR ABDOMEN W WO CONTRAST  Result Date: 02/08/2020 CLINICAL DATA:  Right renal mass on CT.  Left-sided lung cancer. EXAM: MRI ABDOMEN WITHOUT AND WITH CONTRAST TECHNIQUE: Multiplanar multisequence MR imaging of the abdomen was performed both before and after the administration of intravenous contrast. CONTRAST:  6.98mL GADAVIST GADOBUTROL 1 MMOL/ML IV SOLN COMPARISON:  Abdominal CT 02/04/2020. FINDINGS: Portions of exam are mildly motion degraded. Lower chest: Normal heart size without pericardial or  pleural effusion. Hepatobiliary: Normal liver. Normal gallbladder, without biliary ductal dilatation. Pancreas: The pancreatic duct is upper normal for age. No mass or acute inflammation. Spleen:  Normal in size, without focal abnormality. Adrenals/Urinary Tract: Left greater than right adrenal thickening. Bilateral simple renal cysts. Corresponding to the CT abnormality, within the anterior lower pole right kidney 7.5 x 7.5 by 7.7 cm mass which demonstrates precontrast T2 hypointensity and T1 hyperintensity. No post-contrast enhancement, including on subtracted images. No hydronephrosis. Stomach/Bowel: Normal stomach and small bowel. Colonic stool burden suggests constipation. Vascular/Lymphatic: Aortic atherosclerosis. No retroperitoneal or retrocrural adenopathy. Other:  No ascites. Musculoskeletal: No acute osseous abnormality. IMPRESSION: 1. Mildly motion degraded exam. 2. Dominant lower pole right renal mass is consistent with a hemorrhagic/proteinaceous cyst. 3. No acute process or evidence of metastatic disease in the abdomen. 4.  Aortic Atherosclerosis (ICD10-I70.0). Electronically Signed   By: Abigail Miyamoto M.D.   On: 02/08/2020 13:47   DG UGI W SINGLE CM (SOL OR THIN BA)  Result Date: 02/05/2020 CLINICAL DATA:  Abnormal CT. EXAM: WATER SOLUBLE UPPER GI SERIES TECHNIQUE: Single-column upper GI series was performed using water soluble contrast. CONTRAST:  33mL OMNIPAQUE IOHEXOL 300 MG/ML  SOLN COMPARISON:  CT scan 02/04/2020. FLUOROSCOPY TIME:  Fluoroscopy Time:  2 minutes and 48 seconds. Radiation Exposure Index (if provided by the fluoroscopic device): 14.2 mGy Number of Acquired Spot Images: 0 FINDINGS: Study is markedly limited by difficulty with patient positioning and piecemeal swallowing. Within this limitation, there is no gross esophageal abnormality. Surgical clips in the region of the esophagogastric junction or probably related to prior fundoplication. Contrast moves freely from the esophagus  into the stomach although there is poor esophageal peristalsis. Fluoro table was pushes in 30 degrees head up in the patient was rotated in both obliques, but the gastrojejunostomy seen on CT scan earlier today is anterior and the Roux limb could not be opacified given limited stomach filling and inability to position the patient upright. IMPRESSION: 1. No evidence for obstruction at the gastroesophageal junction. 2. 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 Repeat upper GI series could be performed after patient recovers from acute symptoms (preferably as an outpatient) and is better able to participate with drinking contrast and upright positioning. Electronically Signed   By: Misty Stanley M.D.   On: 02/05/2020 13:56   DG Swallowing Func-Speech Pathology  Result Date: 01/29/2020 Objective Swallowing Evaluation: Type of Study: MBS-Modified Barium Swallow Study  Patient Details Name: Daleon Willinger MRN: [id number] Date of Birth: 02-Sep-1940 Today's Date: 01/29/2020 Time: No data recorded-No data recorded No data recorded Past Medical History: Past Medical History: Diagnosis Date . Allergic rhinitis   NEC . Asthma  . Glaucoma  . Lung cancer, main  bronchus ([facility]) 12/2019 . Tobacco use disorder 09/24/2019 Past Surgical History: Past Surgical History: Procedure Laterality Date . COLON SURGERY   . EYE SURGERY Right  . LAMINECTOMY WITH POSTERIOR LATERAL ARTHRODESIS LEVEL 4 N/A 12/30/2019  Procedure: THORACIC THREE LAMINECTOMY, THORACIC INSTRUMENTATION AND FUSION THORACIC ONE-FIVE;  Surgeon: Newman Pies, MD;  Location: [facility];  Service: Neurosurgery;  Laterality: N/A; HPI: see H&P  No data recorded Assessment / Plan / Recommendation CHL IP CLINICAL IMPRESSIONS 01/29/2020 Clinical Impression Patient presents with a moderate oral and mild pharyngeal dysphagia. Oral phase of swallow consists of delayed anterior to posterior transit of all boluses  (heavier boluses leading to longer delays), decreased bolus cohesion and piecemeal swallowing with mechanical soft solids, decreased mastication of mechanical soft solids. Pharyngeal phase consisted of minimal instances of delays of swallow initiation to vallecular sinus with thin liquids, mild amount of pyriform residuals and trace to mild vallecular sinus residuals with thin liquids,mechanical soft solids. 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 No penetration or aspiration events observed. SLP Visit Diagnosis Dysphagia, oropharyngeal phase (R13.12) Attention and concentration deficit following -- Frontal lobe and executive function deficit following -- Impact on safety and function No limitations   No flowsheet data found.  No flowsheet data found. CHL IP DIET RECOMMENDATION 01/29/2020 SLP Diet Recommendations Dysphagia 1 (Puree) solids;Thin liquid Liquid Administration via Cup;Straw Medication Administration Whole meds with puree Compensations Minimize environmental distractions;Slow rate;Small sips/bites;Follow solids with liquid Postural Changes Remain semi-upright after after feeds/meals (Comment)   CHL IP OTHER RECOMMENDATIONS 01/29/2020 Recommended Consults -- Oral Care Recommendations Oral care BID Other Recommendations --   CHL IP FOLLOW UP RECOMMENDATIONS 01/29/2020 Follow up Recommendations None   No flowsheet data found.     CHL IP ORAL PHASE 01/29/2020 Oral Phase Impaired Oral - Pudding Teaspoon -- Oral - Pudding Cup -- Oral - Honey Teaspoon -- Oral - Honey Cup -- Oral - Nectar Teaspoon -- Oral - Nectar Cup -- Oral - Nectar Straw -- Oral - Thin Teaspoon -- Oral - Thin Cup Reduced posterior propulsion;Delayed oral transit Oral - Thin Straw -- Oral - Puree Piecemeal swallowing;Reduced posterior propulsion;Delayed oral transit Oral - Mech Soft Impaired mastication;Weak lingual manipulation;Piecemeal swallowing;Delayed oral transit;Reduced posterior  propulsion;Holding of bolus Oral - Regular -- Oral - Multi-Consistency -- Oral - Pill Decreased bolus cohesion;Reduced posterior propulsion;Weak lingual manipulation;Delayed oral transit Oral Phase - Comment --  CHL IP PHARYNGEAL PHASE 01/29/2020 Pharyngeal Phase Impaired Pharyngeal- Pudding Teaspoon -- Pharyngeal -- Pharyngeal- Pudding Cup -- Pharyngeal -- Pharyngeal- Honey Teaspoon -- Pharyngeal -- Pharyngeal- Honey Cup -- Pharyngeal -- Pharyngeal- Nectar Teaspoon -- Pharyngeal -- Pharyngeal- Nectar Cup -- Pharyngeal -- Pharyngeal- Nectar Straw -- Pharyngeal -- Pharyngeal- Thin Teaspoon -- Pharyngeal -- Pharyngeal- Thin Cup Delayed swallow initiation-vallecula;Pharyngeal residue - pyriform Pharyngeal -- Pharyngeal- Thin Straw Delayed swallow initiation-vallecula Pharyngeal -- Pharyngeal- Puree WFL Pharyngeal -- Pharyngeal- Mechanical Soft Pharyngeal residue - valleculae Pharyngeal -- Pharyngeal- Regular -- Pharyngeal -- Pharyngeal- Multi-consistency -- Pharyngeal -- Pharyngeal- Pill WFL Pharyngeal -- Pharyngeal Comment --  CHL IP CERVICAL ESOPHAGEAL PHASE 01/29/2020 Cervical Esophageal Phase Impaired Pudding Teaspoon -- Pudding Cup -- Honey Teaspoon -- Honey Cup -- Nectar Teaspoon -- Nectar Cup -- Nectar Straw -- Thin Teaspoon -- Thin Cup -- Thin Straw -- Puree Prominent cricopharyngeal segment Mechanical Soft Prominent cricopharyngeal segment Regular -- Multi-consistency -- Pill -- Cervical Esophageal Comment -- Sonia Baller, MA, CCC-SLP Speech Therapy  ECHOCARDIOGRAM COMPLETE  Result Date: 02/06/2020    ECHOCARDIOGRAM REPORT   Patient Name:   Gregory Bautista Date of Exam: 02/06/2020 Medical Rec #:  [id number]         Height:       69.0 in Accession #:    [id number]        Weight:       136.5 lb Date of Birth:  01-08-1941         BSA:          1.756 m Patient Age:    23 years          BP:           99/68 mmHg Patient Gender: M                 HR:           110 bpm. Exam Location:  Inpatient  Procedure: 2D Echo Indications:   stroke 434.91  History:       Patient has no prior history of Echocardiogram examinations.                Cancer.  Sonographer:   Johny Chess Referring      [id number] Rosalin Hawking Phys: IMPRESSIONS  1. Left ventricular ejection fraction, by estimation, is 55 to 60%. The left ventricle has normal function. The left ventricle has no regional wall motion abnormalities. Left ventricular diastolic parameters are consistent with Grade I diastolic dysfunction (impaired relaxation).  2. Right ventricular systolic function is normal. The right ventricular size is normal.  3. The mitral valve is normal in structure. No evidence of mitral valve regurgitation. No evidence of mitral stenosis.  4. The aortic valve is tricuspid. Aortic valve regurgitation is not visualized. Mild to moderate aortic valve sclerosis/calcification is present, without any evidence of aortic stenosis.  5. The inferior vena cava is normal in size with greater than 50% respiratory variability, suggesting right atrial pressure of 3 mmHg. FINDINGS  Left Ventricle: Left ventricular ejection fraction, by estimation, is 55 to 60%. The left ventricle has normal function. The left ventricle has no regional wall motion abnormalities. The left ventricular internal cavity size was normal in size. There is  no left ventricular hypertrophy. Left ventricular diastolic parameters are consistent with Grade I diastolic dysfunction (impaired relaxation). Right Ventricle: The right ventricular size is normal. No increase in right ventricular wall thickness. Right ventricular systolic function is normal. Left Atrium: Left atrial size was normal in size. Right Atrium: Right atrial size was normal in size. Pericardium: There is no evidence of pericardial effusion. Mitral Valve: The mitral valve is normal in structure. No evidence of mitral valve regurgitation. No evidence of mitral valve stenosis. Tricuspid Valve: The tricuspid valve is  normal in structure. Tricuspid valve regurgitation is mild . No evidence of tricuspid stenosis. Aortic Valve: The aortic valve is tricuspid. Aortic valve regurgitation is not visualized. Mild to moderate aortic valve sclerosis/calcification is present, without any evidence of aortic stenosis. Pulmonic Valve: The pulmonic valve was normal in structure. Pulmonic valve regurgitation is not visualized. No evidence of pulmonic stenosis. Aorta: The aortic root is normal in size and structure. Venous: The inferior vena cava is normal in size with greater than 50% respiratory variability, suggesting right atrial pressure of 3 mmHg. IAS/Shunts: No atrial level shunt detected by color flow Doppler.  LEFT VENTRICLE PLAX 2D LVIDd:         3.80 cm  Diastology LVIDs:  2.50 cm  LV e' medial:    5.98 cm/s LV PW:         0.90 cm  LV E/e' medial:  10.1 LV IVS:        0.70 cm  LV e' lateral:   7.94 cm/s LVOT diam:     2.00 cm  LV E/e' lateral: 7.6 LV SV:         45 LV SV Index:   26 LVOT Area:     3.14 cm  RIGHT VENTRICLE             IVC RV S prime:     13.60 cm/s  IVC diam: 1.20 cm TAPSE (M-mode): 2.1 cm LEFT ATRIUM           Index      RIGHT ATRIUM          Index LA diam:      2.40 cm 1.37 cm/m RA Area:     6.41 cm LA Vol (A4C): 15.9 ml 9.05 ml/m RA Volume:   9.99 ml  5.69 ml/m  AORTIC VALVE LVOT Vmax:   79.60 cm/s LVOT Vmean:  55.200 cm/s LVOT VTI:    0.144 m  AORTA Ao Root diam: 3.10 cm MV E velocity: 60.40 cm/s MV A velocity: 96.40 cm/s  SHUNTS MV E/A ratio:  0.63        Systemic VTI:  0.14 m                            Systemic Diam: 2.00 cm Jenkins Rouge MD Electronically signed by Jenkins Rouge MD Signature Date/Time: 02/06/2020/12:03:31 PM    Final    CT ANGIO HEAD CODE STROKE  Result Date: 02/05/2020 CLINICAL DATA:  Neuro deficits, subacute. EXAM: CT ANGIOGRAPHY HEAD AND NECK TECHNIQUE: Multidetector CT imaging of the head and neck was performed using the standard protocol during bolus administration of  intravenous contrast. Multiplanar CT image reconstructions and MIPs were obtained to evaluate the vascular anatomy. Carotid stenosis measurements (when applicable) are obtained utilizing NASCET criteria, using the distal internal carotid diameter as the denominator. CONTRAST:  73mL OMNIPAQUE IOHEXOL 350 MG/ML SOLN COMPARISON:  CT head February 04, 2019.  Same day MRI. FINDINGS: CT HEAD FINDINGS Brain: No evidence of acute large vascular territory infarction, hemorrhage, hydrocephalus, extra-axial collection or mass lesion/mass effect. 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 Vascular: Calcific atherosclerosis. Skull: No acute fracture. Sinuses: Sinuses are largely clear.  Unremarkable orbits. Orbits: No mastoid effusions. Review of the MIP images confirms the above findings CTA NECK FINDINGS Aortic arch: Atherosclerosis of the aorta. The right subclavian artery origin is not imaged. Otherwise, great vessel origins appear patent. 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 Right carotid system: No evidence of dissection, stenosis (50% or greater) or occlusion. Left carotid system: No evidence of dissection, stenosis (50% or greater) or occlusion. Mild narrowing of the common carotid artery origin. Mild atherosclerosis at the carotid bifurcation. The proximal common carotid artery abuts the anterior aspect of the left lung apex mass. Vertebral arteries: Left dominant. 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 Otherwise, no significant stenosis in the neck. Skeleton: Partially imaged thoracic fusion hardware. There is extensive invasion of the partially imaged upper thoracic spine by the left upper lung mass. Other neck: Normal thyroid. 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 The size  and overall extent of the tumor appears progressed since prior CT chest from December 04, 2019. Review of the MIP images confirms the above findings CTA HEAD FINDINGS Anterior circulation: No significant proximal stenosis, large vessel occlusion, aneurysm, or vascular malformation. Bilateral calcific cavernous carotid artery atherosclerosis without evidence of greater than 50% narrowing. Posterior circulation: No significant proximal stenosis, large vessel occlusion, aneurysm, or vascular malformation. The non dominant right vertebral artery makes a small contribution to the basilar artery. Venous sinuses: As permitted by contrast timing, patent. Review of the MIP images confirms the above findings IMPRESSION: 1. No emergent large vessel occlusion. 2. 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 3. 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 The size and overall extent of the tumor appears progressed since prior CT chest from December 04, 2019. CT of the chest with contrast could further characterize lung/mediastinal progression and MRI of the thoracic spine with contrast could further characterize thoracic (including canal) invasion if clinically indicated. Electronically Signed   By: Margaretha Sheffield MD   On: 02/05/2020 21:34   CT ANGIO NECK CODE STROKE  Result Date: 02/05/2020 CLINICAL DATA:  Neuro deficits, subacute. EXAM: CT ANGIOGRAPHY HEAD AND NECK TECHNIQUE: Multidetector CT imaging of the head and neck was performed using the standard protocol during bolus administration of intravenous contrast. Multiplanar CT image reconstructions and MIPs were obtained to evaluate the vascular anatomy. Carotid stenosis measurements (when applicable) are obtained utilizing NASCET criteria, using the distal internal carotid diameter as the denominator. CONTRAST:  40mL OMNIPAQUE  IOHEXOL 350 MG/ML SOLN COMPARISON:  CT head February 04, 2019.  Same day MRI. FINDINGS: CT HEAD FINDINGS Brain: No evidence of acute large vascular territory infarction, hemorrhage, hydrocephalus, extra-axial collection or mass lesion/mass effect. 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 Vascular: Calcific atherosclerosis. Skull: No acute fracture. Sinuses: Sinuses are largely clear.  Unremarkable orbits. Orbits: No mastoid effusions. Review of the MIP images confirms the above findings CTA NECK FINDINGS Aortic arch: Atherosclerosis of the aorta. The right subclavian artery origin is not imaged. Otherwise, great vessel origins appear patent. 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 Right carotid system: No evidence of dissection, stenosis (50% or greater) or occlusion. Left carotid system: No evidence of dissection, stenosis (50% or greater) or occlusion. Mild narrowing of the common carotid artery origin. Mild atherosclerosis at the carotid bifurcation. The proximal common carotid artery abuts the anterior aspect of the left lung apex mass. Vertebral arteries: Left dominant. 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 Otherwise, no significant stenosis in the neck. Skeleton: Partially imaged thoracic fusion hardware. There is extensive invasion of the partially imaged upper thoracic spine by the left upper lung mass. Other neck: Normal thyroid. Upper chest: Large mass involving the left lung apex, which invades into the adjacent thoracic spine and mediastinum. The size and overall extent of the tumor appears progressed since prior CT chest from December 04, 2019. Review of the MIP images confirms the above findings CTA HEAD FINDINGS Anterior circulation: No significant proximal stenosis, large vessel occlusion, aneurysm, or vascular malformation. Bilateral  calcific cavernous carotid artery atherosclerosis without evidence of greater than  50% narrowing. Posterior circulation: No significant proximal stenosis, large vessel occlusion, aneurysm, or vascular malformation. The non dominant right vertebral artery makes a small contribution to the basilar artery. Venous sinuses: As permitted by contrast timing, patent. Review of the MIP images confirms the above findings IMPRESSION: 1. No emergent large vessel occlusion. 2. 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 3. 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 The size and overall extent of the tumor appears progressed since prior CT chest from December 04, 2019. CT of the chest with contrast could further characterize lung/mediastinal progression and MRI of the thoracic spine with contrast could further characterize thoracic (including canal) invasion if clinically indicated. Electronically Signed   By: Margaretha Sheffield MD   On: 02/05/2020 21:34     ASSESSMENT/PLAN:  No problem-specific Assessment & Plan notes found for this encounter.   No orders of the defined types were placed in this encounter.    I spent {CHL ONC TIME VISIT - HDQQI:[id number]} counseling the patient face to face. The total time spent in the appointment was {CHL ONC TIME VISIT - ERDEY:[id number]}.  Chenae Brager L Milia Warth, PA-C 02/18/20

## 2020-02-19 ENCOUNTER — Ambulatory Visit: Payer: Medicare Other

## 2020-02-22 ENCOUNTER — Ambulatory Visit: Payer: Medicare Other

## 2020-02-22 ENCOUNTER — Encounter: Payer: Self-pay | Admitting: *Deleted

## 2020-02-22 NOTE — Progress Notes (Signed)
I received a message that [name] did not make his appt with [facility] last week.  I called [facility] were he is a resident and spoke to his nurse and transportation regarding his appt this week.  Tammy with transportation verbalized understanding and will update him family.

## 2020-02-23 ENCOUNTER — Ambulatory Visit: Payer: Medicare Other

## 2020-02-24 ENCOUNTER — Encounter: Payer: Self-pay | Admitting: *Deleted

## 2020-02-24 ENCOUNTER — Inpatient Hospital Stay: Payer: Medicare Other

## 2020-02-24 ENCOUNTER — Other Ambulatory Visit: Payer: Self-pay

## 2020-02-24 ENCOUNTER — Ambulatory Visit: Payer: Medicare Other

## 2020-02-24 ENCOUNTER — Inpatient Hospital Stay: Payer: Medicare Other | Attending: Internal Medicine | Admitting: Internal Medicine

## 2020-02-24 ENCOUNTER — Encounter: Payer: Self-pay | Admitting: Internal Medicine

## 2020-02-24 ENCOUNTER — Non-Acute Institutional Stay: Payer: Self-pay | Admitting: Hospice

## 2020-02-24 VITALS — BP 125/71 | HR 89 | Temp 97.8°F | Resp 15 | Ht 69.0 in | Wt 145.3 lb

## 2020-02-24 DIAGNOSIS — C7951 Secondary malignant neoplasm of bone: Secondary | ICD-10-CM | POA: Insufficient documentation

## 2020-02-24 DIAGNOSIS — C3492 Malignant neoplasm of unspecified part of left bronchus or lung: Secondary | ICD-10-CM | POA: Diagnosis not present

## 2020-02-24 DIAGNOSIS — R1313 Dysphagia, pharyngeal phase: Secondary | ICD-10-CM

## 2020-02-24 DIAGNOSIS — Z515 Encounter for palliative care: Secondary | ICD-10-CM

## 2020-02-24 DIAGNOSIS — C3491 Malignant neoplasm of unspecified part of right bronchus or lung: Secondary | ICD-10-CM | POA: Diagnosis not present

## 2020-02-24 DIAGNOSIS — R918 Other nonspecific abnormal finding of lung field: Secondary | ICD-10-CM

## 2020-02-24 DIAGNOSIS — C3412 Malignant neoplasm of upper lobe, left bronchus or lung: Secondary | ICD-10-CM | POA: Insufficient documentation

## 2020-02-24 DIAGNOSIS — Z87891 Personal history of nicotine dependence: Secondary | ICD-10-CM | POA: Diagnosis not present

## 2020-02-24 LAB — CMP (CANCER CENTER ONLY)
ALT: 55 U/L — ABNORMAL HIGH (ref 0–44)
AST: 29 U/L (ref 15–41)
Albumin: 2.3 g/dL — ABNORMAL LOW (ref 3.5–5.0)
Alkaline Phosphatase: 138 U/L — ABNORMAL HIGH (ref 38–126)
Anion gap: 10 (ref 5–15)
BUN: 22 mg/dL (ref 8–23)
CO2: 27 mmol/L (ref 22–32)
Calcium: 9.5 mg/dL (ref 8.9–10.3)
Chloride: 101 mmol/L (ref 98–111)
Creatinine: 0.8 mg/dL (ref 0.61–1.24)
GFR, Estimated: >60 mL/min (ref >60)
Glucose, Bld: 115 mg/dL — ABNORMAL HIGH (ref 70–99)
Potassium: 4.8 mmol/L (ref 3.5–5.1)
Sodium: 138 mmol/L (ref 135–145)
Total Bilirubin: 0.4 mg/dL (ref 0.3–1.2)
Total Protein: 7.9 g/dL (ref 6.5–8.1)

## 2020-02-24 LAB — CBC WITH DIFFERENTIAL (CANCER CENTER ONLY)
Abs Immature Granulocytes: 0.04 10*3/uL (ref 0.00–0.07)
Basophils Absolute: 0 10*3/uL (ref 0.0–0.1)
Basophils Relative: 0 %
Eosinophils Absolute: 0 10*3/uL (ref 0.0–0.5)
Eosinophils Relative: 0 %
HCT: 32.4 % — ABNORMAL LOW (ref 39.0–52.0)
Hemoglobin: 10.2 g/dL — ABNORMAL LOW (ref 13.0–17.0)
Immature Granulocytes: 1 %
Lymphocytes Relative: 18 %
Lymphs Abs: 0.9 10*3/uL (ref 0.7–4.0)
MCH: 27.7 pg (ref 26.0–34.0)
MCHC: 31.5 g/dL (ref 30.0–36.0)
MCV: 88 fL (ref 80.0–100.0)
Monocytes Absolute: 0.6 10*3/uL (ref 0.1–1.0)
Monocytes Relative: 13 %
Neutro Abs: 3.5 10*3/uL (ref 1.7–7.7)
Neutrophils Relative %: 68 %
Platelet Count: 335 10*3/uL (ref 150–400)
RBC: 3.68 MIL/uL — ABNORMAL LOW (ref 4.22–5.81)
RDW: 13.8 % (ref 11.5–15.5)
WBC Count: 5.1 10*3/uL (ref 4.0–10.5)
nRBC: 0 % (ref 0.0–0.2)

## 2020-02-24 NOTE — Progress Notes (Signed)
  Oncology Nurse Navigator Documentation  Oncology Nurse Navigator Flowsheets 02/24/2020  Abnormal Finding Date 12/04/2019  Confirmed Diagnosis Date 12/30/2019  Diagnosis Status Confirmed Diagnosis Complete  Planned Course of Treatment Chemotherapy;Targeted Therapy;Radiation  Phase of Treatment Targeted Therapy  Chemotherapy Pending- Reason: Due to Patient Medical Condition  Radiation Actual Start Date: 01/25/2020  Radiation Actual End Date: 02/11/2020  Targeted Therapy Pending Reason: Due to Patient Medical Condition  Navigator Follow Up Date: 02/24/2020  Navigator Follow Up Reason: New Patient Appointment  Navigation Complete Date: 02/24/2020  Post Navigation: Continue to Follow Patient? No  Reason Not Navigating Patient: Seeking Care elsewhere  Navigator Location [facility]  Navigator Encounter Type Clinic/MDC  Patient Visit Type MedOnc/Patient has been referred to oncology in [facility].  Will follow up on referral.    Treatment Phase Pre-Tx/Tx Discussion  Barriers/Navigation Needs Coordination of Care;Education  Education Newly Diagnosed Cancer Education;Other  Interventions Coordination of Care;Education  Acuity Level 2-Minimal Needs (1-2 Barriers Identified)  Coordination of Care Other  Education Method Verbal  Time Spent with Patient 30

## 2020-02-24 NOTE — Progress Notes (Signed)
[place] [phone]   [phone]  OFFICE PROGRESS NOTE  [name], MD [place] [address]  DIAGNOSIS: Stage IV (T4, N2, M1 C) non-small cell lung cancer, adenocarcinoma presented with left upper lobe pleural-based mass extending into the upper mediastinum and upper thoracic spine diagnosed in November 2021.  Biomarker Findings Tumor Mutational Burden - 28 Muts/Mb Microsatellite status - MS-Stable Genomic Findings For a complete list of the genes assayed, please refer to the Appendix. PALB2 D1122N KRAS G13C MTAP loss MYC amplification CDKN2A/B CDKN2B loss, CDKN2A loss EP300 F6812* GATA6 amplification TP53 G154V 7 Disease relevant genes with no reportable alterations: ALK, BRAF, EGFR, ERBB2, MET, RET, ROS1  PD-L1 expression 1%  PRIOR THERAPY:  1) Status post T2-3 and T4-5 laminectomy for resection of the epidural spinal tumor under the care of Dr. Arnoldo Morale on December 30, 2019. 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 Sondra Come  CURRENT THERAPY:None  INTERVAL HISTORY: Gregory Bautista 80 y.o. male returns to the clinic today for hospital follow-up visit accompanied by his brother.  The patient is feeling fine today with no concerning complaints except for the persistent back pain.  The patient is currently a resident of skilled nursing facility and he is expected to be discharged home next week.  He denied having any current chest pain, shortness of breath, cough or hemoptysis.  He has no nausea, vomiting, diarrhea or constipation.  He has no headache or visual changes.  He is expected to have a PET scan performed next week.  He had molecular studies by foundation 1 that showed no actionable mutations and PD-L1 expression was 1%.  The patient is here today for evaluation and discussion of his treatment options.  MEDICAL HISTORY: Past  Medical History:  Diagnosis Date  . Allergic rhinitis    NEC  . Asthma   . Glaucoma   . Lung cancer, main bronchus ([place]) 12/2019  . Tobacco use disorder 09/24/2019    ALLERGIES:  is allergic to penicillins, wellbutrin [bupropion], and morphine and related.  MEDICATIONS:  Current Outpatient Medications  Medication Sig Dispense Refill  . acetaminophen (TYLENOL) 325 MG tablet Place 1-2 tablets (325-650 mg total) into feeding tube every 4 (four) hours as needed for mild pain.    [name] albuterol (VENTOLIN HFA) 108 (90 Base) MCG/ACT inhaler Inhale 2 puffs into the lungs every 6 (six) hours as needed for wheezing or shortness of breath.    [name] aspirin 81 MG chewable tablet Place 1 tablet (81 mg total) into feeding tube daily.    . bisacodyl (DULCOLAX) 10 MG suppository Place 1 suppository (10 mg total) rectally daily after supper. 12 suppository 0  . citalopram (CELEXA) 20 MG tablet Place 1 tablet (20 mg total) into feeding tube daily.    . cyclobenzaprine (FLEXERIL) 10 MG tablet Take 1 tablet (10 mg total) by mouth 3 (three) times daily as needed for muscle spasms. 30 tablet 0  . cyclobenzaprine (FLEXERIL) 10 MG tablet Place 1 tablet (10 mg total) into feeding tube 3 (three) times daily as needed for muscle spasms. 30 tablet 0  . diclofenac Sodium (VOLTAREN) 1 % GEL Apply 1 application topically 4 (four) times daily as needed (pain).     [name] docusate (COLACE) 50 MG/5ML liquid Place 10 mLs (100 mg total) into feeding tube 3 (three) times daily. 100 mL 0  . fludrocortisone (FLORINEF)  0.1 MG tablet Place 1 tablet (0.1 mg total) into feeding tube daily.    . heparin 5000 UNIT/ML injection Inject 1 mL (5,000 Units total) into the skin every 8 (eight) hours. 1 mL   . hydrocerin (EUCERIN) CREA Apply 1 application topically 2 (two) times daily.  0  . HYDROcodone-acetaminophen (NORCO/VICODIN) 5-325 MG tablet Place 1 tablet into feeding tube every 6 (six) hours as needed for severe pain (when tramadol doesn't  work). 6 tablet 0  . ipratropium (ATROVENT) 0.06 % nasal spray Place 2 sprays into both nostrils 3 (three) times daily. 15 mL 12  . lidocaine (LIDODERM) 5 % Place 3 patches onto the skin daily. Remove & Discard patch within 12 hours or as directed by MD 30 patch 0  . lidocaine (XYLOCAINE) 2 % jelly 1 application by Other route daily.    . melatonin 3 MG TABS tablet Take 1 tablet (3 mg total) by mouth at bedtime.  0  . melatonin 3 MG TABS tablet Place 1 tablet (3 mg total) into feeding tube at bedtime.  0  . midodrine (PROAMATINE) 5 MG tablet Place 1 tablet (5 mg total) into feeding tube 3 (three) times daily with meals.    . Multiple Vitamin (MULTIVITAMIN WITH MINERALS) TABS tablet Take 1 tablet by mouth daily.    . Multiple Vitamin (MULTIVITAMIN WITH MINERALS) TABS tablet Place 1 tablet into feeding tube daily.    . Nutritional Supplements (FEEDING SUPPLEMENT, OSMOLITE 1.5 CAL,) LIQD Place 1,000 mLs into feeding tube continuous.  0  . Nutritional Supplements (FEEDING SUPPLEMENT, PROSOURCE TF,) liquid Place 45 mLs into feeding tube 2 (two) times daily.    . pantoprazole sodium (PROTONIX) 40 mg/20 mL PACK Place 20 mLs (40 mg total) into feeding tube daily. 30 mL   . prochlorperazine (COMPAZINE) 10 MG/2ML injection Inject 1-2 mLs (5-10 mg total) into the muscle every 6 (six) hours as needed. 240 mL   . prochlorperazine (COMPAZINE) 25 MG suppository Place 0.5 suppositories (12.5 mg total) rectally every 6 (six) hours as needed for nausea. 12 suppository 0  . psyllium (HYDROCIL/METAMUCIL) 95 % PACK Take 1 packet by mouth daily. 240 each   . QUEtiapine (SEROQUEL) 25 MG tablet Place 1 tablet (25 mg total) into feeding tube at bedtime as needed (agitation/insomnia). 5 tablet 0  . senna (SENOKOT) 8.6 MG TABS tablet Place 2 tablets (17.2 mg total) into feeding tube daily. 120 tablet 0  . sildenafil (VIAGRA) 100 MG tablet     . SYMBICORT 160-4.5 MCG/ACT inhaler Inhale 1 puff into the lungs 2 (two) times  daily as needed (shortness of breath).     . timolol (TIMOPTIC) 0.5 % ophthalmic solution Place 1 drop into both eyes 2 (two) times daily.     . traMADol (ULTRAM) 50 MG tablet Place 1 tablet (50 mg total) into feeding tube 4 (four) times daily as needed for moderate pain. 10 tablet 0  . Travoprost, BAK Free, (TRAVATAN) 0.004 % SOLN ophthalmic solution Place 1 drop into both eyes at bedtime.     . traZODone (DESYREL) 100 MG tablet Place 1 tablet (100 mg total) into feeding tube at bedtime.    . Water For Irrigation, Sterile (FREE WATER) SOLN Place 200 mLs into feeding tube 4 (four) times daily - after meals and at bedtime.     No current facility-administered medications for this visit.    SURGICAL HISTORY:  Past Surgical History:  Procedure Laterality Date  . COLON SURGERY    .  ESOPHAGOGASTRODUODENOSCOPY (EGD) WITH PROPOFOL  02/09/2020   Procedure: ESOPHAGOGASTRODUODENOSCOPY (EGD) WITH PROPOFOL;  Surgeon: Georganna Skeans, MD;  Location: [facility];  Service: General;;  . EYE SURGERY Right   . LAMINECTOMY WITH POSTERIOR LATERAL ARTHRODESIS LEVEL 4 N/A 12/30/2019   Procedure: THORACIC THREE LAMINECTOMY, THORACIC INSTRUMENTATION AND FUSION THORACIC ONE-FIVE;  Surgeon: Newman Pies, MD;  Location: [facility];  Service: Neurosurgery;  Laterality: N/A;  . PEG PLACEMENT N/A 02/09/2020   Procedure: PERCUTANEOUS ENDOSCOPIC GASTROSTOMY (PEG) PLACEMENT;  Surgeon: Georganna Skeans, MD;  Location: [facility];  Service: General;  Laterality: N/A;    REVIEW OF SYSTEMS:  Constitutional: positive for fatigue and weight loss Eyes: negative Ears, nose, mouth, throat, and face: negative Respiratory: positive for dyspnea on exertion Cardiovascular: negative Gastrointestinal: negative Genitourinary:negative Integument/breast: negative Hematologic/lymphatic: negative Musculoskeletal:positive for back pain Neurological: negative Behavioral/Psych: negative Endocrine: negative Allergic/Immunologic: negative    PHYSICAL EXAMINATION: General appearance: alert, cooperative and fatigued Head: Normocephalic, without obvious abnormality, atraumatic Neck: no adenopathy, no JVD, supple, symmetrical, trachea midline and thyroid not enlarged, symmetric, no tenderness/mass/nodules Lymph nodes: Cervical, supraclavicular, and axillary nodes normal. Resp: clear to auscultation bilaterally Back: symmetric, no curvature. ROM normal. No CVA tenderness. Cardio: regular rate and rhythm, S1, S2 normal, no murmur, click, rub or gallop GI: soft, non-tender; bowel sounds normal; no masses,  no organomegaly Extremities: extremities normal, atraumatic, no cyanosis or edema Neurologic: Alert and oriented X 3, normal strength and tone. Normal symmetric reflexes. Normal coordination and gait  ECOG PERFORMANCE STATUS: 1 - Symptomatic but completely ambulatory  Blood pressure 125/71, pulse 89, temperature 97.8 F (36.6 C), temperature source Tympanic, resp. rate 15, height 5' 9"  (1.753 m), weight 145 lb 4.8 oz (65.9 kg), SpO2 98 %.  LABORATORY DATA: Lab Results  Component Value Date   WBC 5.1 02/24/2020   HGB 10.2 (L) 02/24/2020   HCT 32.4 (L) 02/24/2020   MCV 88.0 02/24/2020   PLT 335 02/24/2020      Chemistry      Component Value Date/Time   NA 138 02/24/2020 1356   K 4.8 02/24/2020 1356   CL 101 02/24/2020 1356   CO2 27 02/24/2020 1356   BUN 22 02/24/2020 1356   CREATININE 0.80 02/24/2020 1356      Component Value Date/Time   CALCIUM 9.5 02/24/2020 1356   ALKPHOS 138 (H) 02/24/2020 1356   AST 29 02/24/2020 1356   ALT 55 (H) 02/24/2020 1356   BILITOT 0.4 02/24/2020 1356       RADIOGRAPHIC STUDIES: CT ABDOMEN WO CONTRAST  Result Date: 02/05/2020 CLINICAL DATA:  Evaluate gastric anatomy prior to potential percutaneous gastrostomy placement. EXAM: CT ABDOMEN WITHOUT CONTRAST TECHNIQUE: Multidetector CT imaging of the abdomen was performed following the standard protocol without IV contrast.  COMPARISON:  Chest CT-12/04/2019 FINDINGS: The lack of intravenous contrast limits the ability to evaluate solid abdominal organs. Lower chest: Limited visualization of the lower thorax demonstrates minimal dependent subpleural ground-glass atelectasis. No discrete focal airspace opacities. No pleural effusion Normal heart size. No pericardial effusion. There is mild decreased attenuation of the intra cardiac blood pool suggestive of anemia. Hepatobiliary: Normal hepatic contour. Normal noncontrast appearance of the gallbladder given degree of distention. No radiopaque gallstones. No ascites. Pancreas: Normal noncontrast appearance of the pancreas. Spleen: Normal noncontrast appearance of the spleen. Adrenals/Urinary Tract: There is a large (approximately 8.5 x 7.9 x 7.6 cm peripherally calcified mixed attenuating mass arising from the anterior aspect of the interpolar aspect of the right kidney (axial image 31, series 3;  coronal image 50, series 6). Additional punctate (sub 1.1 cm) hypoattenuating renal lesions are seen bilaterally, incompletely evaluated though favored to represent renal cysts. Note is made of an approximately 0.8 x 0.6 cm nonobstructing stone within the right renal pelvis (axial image 25, series 3; coronal image 62, series 6). There is a punctate (approximately 2 mm) nonobstructing stone within the anterior interpolar aspect the left kidney (axial image 25, series 3). No definitive renal stones are seen along the imaged course of the superior aspect of the bilateral ureters. Very minimal amount of symmetric bilateral perinephric stranding. No evidence of urinary obstruction. There is mild thickening of the lateral limb of the left adrenal gland without discrete nodule. Normal noncontrast appearance of the right adrenal gland. The urinary bladder was not imaged. Stomach/Bowel: Postoperative change of the GE junction as well as the anterior aspect of the gastric antrum potentially the sequela of  previous gastrojejunostomy creation though a distal anastomosis was not imaged. Ingested enteric contrast is seen within the colon. No evidence of enteric obstruction. The anterior wall of the gastric antrum is otherwise well apposed against the ventral wall of the upper abdomen without interposition either the colon or liver (axial image 20, series 3). No pneumoperitoneum, pneumatosis or portal venous gas. Vascular/Lymphatic: Atherosclerotic plaque within a normal caliber abdominal aorta. No bulky retroperitoneal or mesenteric lymphadenopathy on this noncontrast examination. Other: Dermal calcification overlies the ventral aspect of the upper abdomen. Regional soft tissues appear otherwise normal. Musculoskeletal: No acute or aggressive osseous abnormalities. Mild-to-moderate multilevel lumbar spine DDD, worse at L3-L4 and L4-L5 with disc space height loss, endplate irregularity and small posteriorly directed disc osteophyte complexes at these locations. IMPRESSION: 1. Postoperative change of the GE junction as well as the anterior aspect of the gastric antrum which may represent the sequela of previous gastrojejunostomy. Correlation with previous operative reports is advised as if the patient is post gastrojejunostomy creation he would NOT be a candidate for percutaneous gastrostomy tube placement. If operative reports are not available, further evaluation with upper GI series could be performed as indicated. 2. Gastric anatomy otherwise amenable to percutaneous gastrostomy tube placement as indicated. 3. Incidentally noted approximately 8.5 cm mixed attenuating mass arising from the anterior aspect of the interpolar aspect of the right kidney - while potentially representative of a complex renal cyst, further evaluation with contrast-enhanced abdominal MRI is advised. 4. Nonobstructing bilateral nephrolithiasis. 5. Aortic Atherosclerosis (ICD10-I70.0). Electronically Signed   By: Sandi Mariscal M.D.   On: 02/05/2020  08:09   CT HEAD WO CONTRAST  Result Date: 02/04/2020 CLINICAL DATA:  Acute loss of vision right eye. History of lung carcinoma EXAM: CT HEAD WITHOUT CONTRAST TECHNIQUE: Contiguous axial images were obtained from the base of the skull through the vertex without intravenous contrast. COMPARISON:  None. FINDINGS: Brain: There is mild age related volume loss. There is no intracranial mass, hemorrhage, extra-axial fluid collection, or midline shift. Decreased attenuation is noted in the anterior limb of each external capsule, likely due to prior lacunar type infarcts in these areas. No acute appearing infarct is evident. Vascular: No hyperdense vessel. There is calcification in each carotid siphon region. Skull: Bony calvarium appears intact. Sinuses/Orbits: There is mucosal thickening in several ethmoid air cells. Other visualized paranasal sinuses are clear. Orbits appear symmetric bilaterally. Other: Mastoid air cells are clear. 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 No acute appearing infarct is evident. No mass or hemorrhage. There are foci of arterial vascular  calcification. There is mucosal thickening in several ethmoid air cells. Electronically Signed   By: Lowella Grip III M.D.   On: 02/04/2020 12:54   MR BRAIN W WO CONTRAST  Result Date: 02/05/2020 CLINICAL DATA:  Stroke suspected.  Vision loss. EXAM: MRI HEAD WITHOUT AND WITH CONTRAST TECHNIQUE: Multiplanar, multiecho pulse sequences of the brain and surrounding structures were obtained without and with intravenous contrast. CONTRAST:  6.31m GADAVIST GADOBUTROL 1 MMOL/ML IV SOLN COMPARISON:  CT head from the same day FINDINGS: Brain: No acute infarction, hemorrhage, hydrocephalus, extra-axial collection or mass lesion. 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 No abnormal enhancement. Vascular: Major arterial  flow voids are maintained at the skull base. Skull and upper cervical spine: Normal marrow signal. Right frontal scalp lipoma. Sinuses/Orbits: No substantial paranasal sinus disease. Unremarkable orbits. Other: No mastoid effusions. IMPRESSION: No acute abnormality.  Specifically, no acute infarct. Electronically Signed   By: FMargaretha SheffieldMD   On: 02/05/2020 20:29   MR ABDOMEN W WO CONTRAST  Result Date: 02/08/2020 CLINICAL DATA:  Right renal mass on CT.  Left-sided lung cancer. EXAM: MRI ABDOMEN WITHOUT AND WITH CONTRAST TECHNIQUE: Multiplanar multisequence MR imaging of the abdomen was performed both before and after the administration of intravenous contrast. CONTRAST:  6.562mGADAVIST GADOBUTROL 1 MMOL/ML IV SOLN COMPARISON:  Abdominal CT 02/04/2020. FINDINGS: Portions of exam are mildly motion degraded. Lower chest: Normal heart size without pericardial or pleural effusion. Hepatobiliary: Normal liver. Normal gallbladder, without biliary ductal dilatation. Pancreas: The pancreatic duct is upper normal for age. No mass or acute inflammation. Spleen:  Normal in size, without focal abnormality. Adrenals/Urinary Tract: Left greater than right adrenal thickening. Bilateral simple renal cysts. Corresponding to the CT abnormality, within the anterior lower pole right kidney 7.5 x 7.5 by 7.7 cm mass which demonstrates precontrast T2 hypointensity and T1 hyperintensity. No post-contrast enhancement, including on subtracted images. No hydronephrosis. Stomach/Bowel: Normal stomach and small bowel. Colonic stool burden suggests constipation. Vascular/Lymphatic: Aortic atherosclerosis. No retroperitoneal or retrocrural adenopathy. Other:  No ascites. Musculoskeletal: No acute osseous abnormality. IMPRESSION: 1. Mildly motion degraded exam. 2. Dominant lower pole right renal mass is consistent with a hemorrhagic/proteinaceous cyst. 3. No acute process or evidence of metastatic disease in the abdomen. 4.  Aortic  Atherosclerosis (ICD10-I70.0). Electronically Signed   By: KyAbigail Miyamoto.D.   On: 02/08/2020 13:47   DG UGI W SINGLE CM (SOL OR THIN BA)  Result Date: 02/05/2020 CLINICAL DATA:  Abnormal CT. EXAM: WATER SOLUBLE UPPER GI SERIES TECHNIQUE: Single-column upper GI series was performed using water soluble contrast. CONTRAST:  50100mMNIPAQUE IOHEXOL 300 MG/ML  SOLN COMPARISON:  CT scan 02/04/2020. FLUOROSCOPY TIME:  Fluoroscopy Time:  2 minutes and 48 seconds. Radiation Exposure Index (if provided by the fluoroscopic device): 14.2 mGy Number of Acquired Spot Images: 0 FINDINGS: Study is markedly limited by difficulty with patient positioning and piecemeal swallowing. Within this limitation, there is no gross esophageal abnormality. Surgical clips in the region of the esophagogastric junction or probably related to prior fundoplication. Contrast moves freely from the esophagus into the stomach although there is poor esophageal peristalsis. Fluoro table was pushes in 30 degrees head up in the patient was rotated in both obliques, but the gastrojejunostomy seen on CT scan earlier today is anterior and the Roux limb could not be opacified given limited stomach filling and inability to position the patient upright. IMPRESSION: 1. No evidence for obstruction at the gastroesophageal  junction. 2. 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 Repeat upper GI series could be performed after patient recovers from acute symptoms (preferably as an outpatient) and is better able to participate with drinking contrast and upright positioning. Electronically Signed   By: Misty Stanley M.D.   On: 02/05/2020 13:56   DG Swallowing Func-Speech Pathology  Result Date: 01/29/2020 Objective Swallowing Evaluation: Type of Study: MBS-Modified Barium Swallow Study  Patient Details Name: Mansel Strother MRN: [id number] Date of Birth: 1940-10-11 Today's Date: 01/29/2020  Time: No data recorded-No data recorded No data recorded Past Medical History: Past Medical History: Diagnosis Date . Allergic rhinitis   NEC . Asthma  . Glaucoma  . Lung cancer, main bronchus ([facility]) 12/2019 . Tobacco use disorder 09/24/2019 Past Surgical History: Past Surgical History: Procedure Laterality Date . COLON SURGERY   . EYE SURGERY Right  . LAMINECTOMY WITH POSTERIOR LATERAL ARTHRODESIS LEVEL 4 N/A 12/30/2019  Procedure: THORACIC THREE LAMINECTOMY, THORACIC INSTRUMENTATION AND FUSION THORACIC ONE-FIVE;  Surgeon: Newman Pies, MD;  Location: [facility];  Service: Neurosurgery;  Laterality: N/A; HPI: see H&P  No data recorded Assessment / Plan / Recommendation CHL IP CLINICAL IMPRESSIONS 01/29/2020 Clinical Impression Patient presents with a moderate oral and mild pharyngeal dysphagia. Oral phase of swallow consists of delayed anterior to posterior transit of all boluses (heavier boluses leading to longer delays), decreased bolus cohesion and piecemeal swallowing with mechanical soft solids, decreased mastication of mechanical soft solids. Pharyngeal phase consisted of minimal instances of delays of swallow initiation to vallecular sinus with thin liquids, mild amount of pyriform residuals and trace to mild vallecular sinus residuals with thin liquids,mechanical soft solids. 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 No penetration or aspiration events observed. SLP Visit Diagnosis Dysphagia, oropharyngeal phase (R13.12) Attention and concentration deficit following -- Frontal lobe and executive function deficit following -- Impact on safety and function No limitations   No flowsheet data found.  No flowsheet data found. CHL IP DIET RECOMMENDATION 01/29/2020 SLP Diet Recommendations Dysphagia 1 (Puree) solids;Thin liquid Liquid Administration via Cup;Straw Medication Administration Whole meds with puree Compensations Minimize environmental  distractions;Slow rate;Small sips/bites;Follow solids with liquid Postural Changes Remain semi-upright after after feeds/meals (Comment)   CHL IP OTHER RECOMMENDATIONS 01/29/2020 Recommended Consults -- Oral Care Recommendations Oral care BID Other Recommendations --   CHL IP FOLLOW UP RECOMMENDATIONS 01/29/2020 Follow up Recommendations None   No flowsheet data found.     CHL IP ORAL PHASE 01/29/2020 Oral Phase Impaired Oral - Pudding Teaspoon -- Oral - Pudding Cup -- Oral - Honey Teaspoon -- Oral - Honey Cup -- Oral - Nectar Teaspoon -- Oral - Nectar Cup -- Oral - Nectar Straw -- Oral - Thin Teaspoon -- Oral - Thin Cup Reduced posterior propulsion;Delayed oral transit Oral - Thin Straw -- Oral - Puree Piecemeal swallowing;Reduced posterior propulsion;Delayed oral transit Oral - Mech Soft Impaired mastication;Weak lingual manipulation;Piecemeal swallowing;Delayed oral transit;Reduced posterior propulsion;Holding of bolus Oral - Regular -- Oral - Multi-Consistency -- Oral - Pill Decreased bolus cohesion;Reduced posterior propulsion;Weak lingual manipulation;Delayed oral transit Oral Phase - Comment --  CHL IP PHARYNGEAL PHASE 01/29/2020 Pharyngeal Phase Impaired Pharyngeal- Pudding Teaspoon -- Pharyngeal -- Pharyngeal- Pudding Cup -- Pharyngeal -- Pharyngeal- Honey Teaspoon -- Pharyngeal -- Pharyngeal- Honey Cup -- Pharyngeal -- Pharyngeal- Nectar Teaspoon -- Pharyngeal -- Pharyngeal- Nectar Cup -- Pharyngeal -- Pharyngeal- Nectar Straw -- Pharyngeal -- Pharyngeal- Thin  Teaspoon -- Pharyngeal -- Pharyngeal- Thin Cup Delayed swallow initiation-vallecula;Pharyngeal residue - pyriform Pharyngeal -- Pharyngeal- Thin Straw Delayed swallow initiation-vallecula Pharyngeal -- Pharyngeal- Puree WFL Pharyngeal -- Pharyngeal- Mechanical Soft Pharyngeal residue - valleculae Pharyngeal -- Pharyngeal- Regular -- Pharyngeal -- Pharyngeal- Multi-consistency -- Pharyngeal -- Pharyngeal- Pill WFL Pharyngeal -- Pharyngeal Comment --  CHL IP  CERVICAL ESOPHAGEAL PHASE 01/29/2020 Cervical Esophageal Phase Impaired Pudding Teaspoon -- Pudding Cup -- Honey Teaspoon -- Honey Cup -- Nectar Teaspoon -- Nectar Cup -- Nectar Straw -- Thin Teaspoon -- Thin Cup -- Thin Straw -- Puree Prominent cricopharyngeal segment Mechanical Soft Prominent cricopharyngeal segment Regular -- Multi-consistency -- Pill -- Cervical Esophageal Comment -- Sonia Baller, MA, CCC-SLP Speech Therapy             ECHOCARDIOGRAM COMPLETE  Result Date: 02/06/2020    ECHOCARDIOGRAM REPORT   Patient Name:   Gregory Bautista Date of Exam: 02/06/2020 Medical Rec #:  [id number]         Height:       69.0 in Accession #:    [id number]        Weight:       136.5 lb Date of Birth:  1940/05/15         BSA:          1.756 m Patient Age:    67 years          BP:           99/68 mmHg Patient Gender: M                 HR:           110 bpm. Exam Location:  Inpatient Procedure: 2D Echo Indications:   stroke 434.91  History:       Patient has no prior history of Echocardiogram examinations.                Cancer.  Sonographer:   Johny Chess Referring      [id number] Rosalin Hawking Phys: IMPRESSIONS  1. Left ventricular ejection fraction, by estimation, is 55 to 60%. The left ventricle has normal function. The left ventricle has no regional wall motion abnormalities. Left ventricular diastolic parameters are consistent with Grade I diastolic dysfunction (impaired relaxation).  2. Right ventricular systolic function is normal. The right ventricular size is normal.  3. The mitral valve is normal in structure. No evidence of mitral valve regurgitation. No evidence of mitral stenosis.  4. The aortic valve is tricuspid. Aortic valve regurgitation is not visualized. Mild to moderate aortic valve sclerosis/calcification is present, without any evidence of aortic stenosis.  5. The inferior vena cava is normal in size with greater than 50% respiratory variability, suggesting right atrial pressure of 3 mmHg.  FINDINGS  Left Ventricle: Left ventricular ejection fraction, by estimation, is 55 to 60%. The left ventricle has normal function. The left ventricle has no regional wall motion abnormalities. The left ventricular internal cavity size was normal in size. There is  no left ventricular hypertrophy. Left ventricular diastolic parameters are consistent with Grade I diastolic dysfunction (impaired relaxation). Right Ventricle: The right ventricular size is normal. No increase in right ventricular wall thickness. Right ventricular systolic function is normal. Left Atrium: Left atrial size was normal in size. Right Atrium: Right atrial size was normal in size. Pericardium: There is no evidence of pericardial effusion. Mitral Valve: The mitral valve is normal in structure. No evidence of mitral valve regurgitation. No evidence of mitral  valve stenosis. Tricuspid Valve: The tricuspid valve is normal in structure. Tricuspid valve regurgitation is mild . No evidence of tricuspid stenosis. Aortic Valve: The aortic valve is tricuspid. Aortic valve regurgitation is not visualized. Mild to moderate aortic valve sclerosis/calcification is present, without any evidence of aortic stenosis. Pulmonic Valve: The pulmonic valve was normal in structure. Pulmonic valve regurgitation is not visualized. No evidence of pulmonic stenosis. Aorta: The aortic root is normal in size and structure. Venous: The inferior vena cava is normal in size with greater than 50% respiratory variability, suggesting right atrial pressure of 3 mmHg. IAS/Shunts: No atrial level shunt detected by color flow Doppler.  LEFT VENTRICLE PLAX 2D LVIDd:         3.80 cm  Diastology LVIDs:         2.50 cm  LV e' medial:    5.98 cm/s LV PW:         0.90 cm  LV E/e' medial:  10.1 LV IVS:        0.70 cm  LV e' lateral:   7.94 cm/s LVOT diam:     2.00 cm  LV E/e' lateral: 7.6 LV SV:         45 LV SV Index:   26 LVOT Area:     3.14 cm  RIGHT VENTRICLE             IVC RV S  prime:     13.60 cm/s  IVC diam: 1.20 cm TAPSE (M-mode): 2.1 cm LEFT ATRIUM           Index      RIGHT ATRIUM          Index LA diam:      2.40 cm 1.37 cm/m RA Area:     6.41 cm LA Vol (A4C): 15.9 ml 9.05 ml/m RA Volume:   9.99 ml  5.69 ml/m  AORTIC VALVE LVOT Vmax:   79.60 cm/s LVOT Vmean:  55.200 cm/s LVOT VTI:    0.144 m  AORTA Ao Root diam: 3.10 cm MV E velocity: 60.40 cm/s MV A velocity: 96.40 cm/s  SHUNTS MV E/A ratio:  0.63        Systemic VTI:  0.14 m                            Systemic Diam: 2.00 cm Jenkins Rouge MD Electronically signed by Jenkins Rouge MD Signature Date/Time: 02/06/2020/12:03:31 PM    Final    CT ANGIO HEAD CODE STROKE  Result Date: 02/05/2020 CLINICAL DATA:  Neuro deficits, subacute. EXAM: CT ANGIOGRAPHY HEAD AND NECK TECHNIQUE: Multidetector CT imaging of the head and neck was performed using the standard protocol during bolus administration of intravenous contrast. Multiplanar CT image reconstructions and MIPs were obtained to evaluate the vascular anatomy. Carotid stenosis measurements (when applicable) are obtained utilizing NASCET criteria, using the distal internal carotid diameter as the denominator. CONTRAST:  34m OMNIPAQUE IOHEXOL 350 MG/ML SOLN COMPARISON:  CT head February 04, 2019.  Same day MRI. FINDINGS: CT HEAD FINDINGS Brain: No evidence of acute large vascular territory infarction, hemorrhage, hydrocephalus, extra-axial collection or mass lesion/mass effect. 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 Vascular: Calcific atherosclerosis. Skull: No acute fracture. Sinuses: Sinuses are largely clear.  Unremarkable orbits. Orbits: No mastoid effusions. Review of the MIP images confirms the above findings CTA NECK FINDINGS Aortic arch: Atherosclerosis of the aorta. The right subclavian artery  origin is not imaged. Otherwise, great vessel origins appear patent. The left subclavian artery is encased by the  left lung apex mass. Right carotid system: No evidence of dissection, stenosis (50% or greater) or occlusion. Left carotid system: No evidence of dissection, stenosis (50% or greater) or occlusion. Mild narrowing of the common carotid artery origin. Mild atherosclerosis at the carotid bifurcation. The proximal common carotid artery abuts the anterior aspect of the left lung apex mass. Vertebral arteries: Left dominant. 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 Otherwise, no significant stenosis in the neck. Skeleton: Partially imaged thoracic fusion hardware. There is extensive invasion of the partially imaged upper thoracic spine by the left upper lung mass. Other neck: Normal thyroid. Upper chest: Large mass involving the left lung apex, which invades into the adjacent thoracic spine and mediastinum. The size and overall extent of the tumor appears progressed since prior CT chest from December 04, 2019. Review of the MIP images confirms the above findings CTA HEAD FINDINGS Anterior circulation: No significant proximal stenosis, large vessel occlusion, aneurysm, or vascular malformation. Bilateral calcific cavernous carotid artery atherosclerosis without evidence of greater than 50% narrowing. Posterior circulation: No significant proximal stenosis, large vessel occlusion, aneurysm, or vascular malformation. The non dominant right vertebral artery makes a small contribution to the basilar artery. Venous sinuses: As permitted by contrast timing, patent. Review of the MIP images confirms the above findings IMPRESSION: 1. No emergent large vessel occlusion. 2. 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 3. 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 The size and overall extent of the tumor appears progressed  since prior CT chest from December 04, 2019. CT of the chest with contrast could further characterize lung/mediastinal progression and MRI of the thoracic spine with contrast could further characterize thoracic (including canal) invasion if clinically indicated. Electronically Signed   By: Margaretha Sheffield MD   On: 02/05/2020 21:34   CT ANGIO NECK CODE STROKE  Result Date: 02/05/2020 CLINICAL DATA:  Neuro deficits, subacute. EXAM: CT ANGIOGRAPHY HEAD AND NECK TECHNIQUE: Multidetector CT imaging of the head and neck was performed using the standard protocol during bolus administration of intravenous contrast. Multiplanar CT image reconstructions and MIPs were obtained to evaluate the vascular anatomy. Carotid stenosis measurements (when applicable) are obtained utilizing NASCET criteria, using the distal internal carotid diameter as the denominator. CONTRAST:  57m OMNIPAQUE IOHEXOL 350 MG/ML SOLN COMPARISON:  CT head February 04, 2019.  Same day MRI. FINDINGS: CT HEAD FINDINGS Brain: No evidence of acute large vascular territory infarction, hemorrhage, hydrocephalus, extra-axial collection or mass lesion/mass effect. 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 Vascular: Calcific atherosclerosis. Skull: No acute fracture. Sinuses: Sinuses are largely clear.  Unremarkable orbits. Orbits: No mastoid effusions. Review of the MIP images confirms the above findings CTA NECK FINDINGS Aortic arch: Atherosclerosis of the aorta. The right subclavian artery origin is not imaged. Otherwise, great vessel origins appear patent. 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 Right carotid system: No evidence of dissection, stenosis (50% or greater) or occlusion. Left carotid system: No evidence of dissection, stenosis (50% or greater) or occlusion. Mild narrowing of the common carotid artery origin. Mild atherosclerosis at the carotid  bifurcation. The proximal common carotid artery abuts the  anterior aspect of the left lung apex mass. Vertebral arteries: Left dominant. 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 Otherwise, no significant stenosis in the neck. Skeleton: Partially imaged thoracic fusion hardware. There is extensive invasion of the partially imaged upper thoracic spine by the left upper lung mass. Other neck: Normal thyroid. Upper chest: Large mass involving the left lung apex, which invades into the adjacent thoracic spine and mediastinum. The size and overall extent of the tumor appears progressed since prior CT chest from December 04, 2019. Review of the MIP images confirms the above findings CTA HEAD FINDINGS Anterior circulation: No significant proximal stenosis, large vessel occlusion, aneurysm, or vascular malformation. Bilateral calcific cavernous carotid artery atherosclerosis without evidence of greater than 50% narrowing. Posterior circulation: No significant proximal stenosis, large vessel occlusion, aneurysm, or vascular malformation. The non dominant right vertebral artery makes a small contribution to the basilar artery. Venous sinuses: As permitted by contrast timing, patent. Review of the MIP images confirms the above findings IMPRESSION: 1. No emergent large vessel occlusion. 2. 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 3. 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 The size and overall extent of the tumor appears progressed since prior CT chest from December 04, 2019. CT of the chest with contrast could further characterize lung/mediastinal progression and MRI of the thoracic spine with contrast could further characterize thoracic (including canal) invasion if clinically indicated. Electronically Signed    By: Margaretha Sheffield MD   On: 02/05/2020 21:34    ASSESSMENT AND PLAN: This is a very pleasant 80 years old African-American male diagnosed with stage IV (T4, N2, M1 C) non-small cell lung cancer, adenocarcinoma presented with left upper lobe pleural-based mass in addition to mediastinal invasion and metastatic disease to the thoracic spine status post laminectomy followed by palliative radiotherapy. The patient has no actionable mutations on the molecular studies and PD-L1 expression is 1%. He had laminectomy of the T2-3 and T4-5 for removal of epidural tumor followed by palliative radiotherapy. I had a lengthy discussion with the patient today about his current condition and treatment options.  The patient understands that he has incurable condition and all the treatment will be of palliative nature.  I gave him the option of palliative care and hospice referral versus palliative systemic chemotherapy with carboplatin for AUC of 5, Alimta 500 mg/M2 and Keytruda 200 mg IV every 3 weeks.  The patient is interested in treatment but he mentions that he is moving to [facility] after discharge from the skilled nursing facility next week to be close to his other family members who were able to provide him with care.  I will refer the patient to a medical oncologist in [facility]. I recommended for him to proceed with the PET scan next week as planned and will fax his records to the new medical oncologist. The patient was advised to call immediately if he has any other concerning symptoms in the interval.  The patient voices understanding of current disease status and treatment options and is in agreement with the current care plan.  All questions were answered. The patient knows to call the clinic with any problems, questions or concerns. We can certainly see the patient much sooner if necessary.  The total time spent in the appointment was  40 minutes.  Disclaimer: This note  was dictated with voice recognition software. Similar sounding words can inadvertently be transcribed and may not be corrected upon review.

## 2020-02-24 NOTE — Progress Notes (Signed)
[facility] Consult Note Telephone: [phone]  Fax: [phone]  PATIENT NAME: Gregory Bautista [address] [place] [address] [phone] (home)  DOB: 22-May-1940 MRN: [id number]  PRIMARY CARE PROVIDER:    [name], MD,  [place] [address] [phone]  REFERRING PROVIDER:   Martinique Blattenberger, NP/Dr. Garwin Bautista  RESPONSIBLE PARTY:  Self  Extended Emergency Contact Information Primary Emergency Contact: Gregory Bautista Mobile Phone: [phone] Relation: Brother Interpreter needed? No Secondary Emergency Contact: Gregory Bautista Phone: [phone] Relation: Daughter  CHIEF COMPLAIN: Initial palliative care/Dysphagia  Visit is to build trust and highlight Palliative Medicine as specialized medical care for people living with serious illness, aimed at facilitating better quality of life through symptoms relief, assisting with advance care plan and establishing goals of care. Patient endorsed palliative services.  NP called Gregory Bautista and left him a voicemail with callback number. Gregory Bautista called back later and was updated on patient's status. He expressed appreciation for the call.  Discussion on the difference between Palliative and Hospice care.  Visit consisted of counseling and education dealing with the complex and emotionally intense issues of symptom management and palliative care in the setting of serious and potentially life-threatening illness. Palliative care team will continue to support patient, patient's family, and medical team.  RECOMMENDATIONS/PLAN:   Advance Care Planning: Our advance care planning conversation included a discussion about  the value and importance of advance care planning, exploration of goals of care in the event of a sudden injury or illness, identification and preparation of a healthcare agent and review and updating or creation of an advance directive  document.  CODE STATUS: Code status reviewed. Patient affirmed he is a DO NOT RESUSCITATE. Signed DNR form in faclilty records. MOST  Selections include do not attempt resuscitation, comfort measures, use  of antibiotics when infection occurs, IV fluids if indicated, feeding tube long-term if indicated.  GOALS OF CARE: Goals of care include to maximize quality of life and symptom management.   I spent 46 minutes providing this consultation. More than 50% of the time in this consultation was spent in counseling and care coordination. 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  Continue Osmolite 1.5 via peg tube at 46m/hr. Continue pureed diet by mouth with aspiration precautions. ST consult as ongoing. MBS study if indicated. Hydrocodone as ordered for pain Palliative will continue to monitor for symptom management/decline and make recommendations as needed. Return 6 weeks or prn. Encouraged to call provider sooner with any concerns.   PPS: 50%  HOSPICE ELIGIBILITY/DIAGNOSIS: TBD  HISTORY OF PRESENT ILLNESS:  Gregory Schiffmanis a 80y.o. 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 Facility ST reports patient receiving 100% nutritional support via peg tube; pureed diet and thin liquids for pleasure only.  Epic chart review shows MBS done on 01/07 showing moderate oral and mild pharyngeal dysphagia therefore diet was downgraded to dysphagia 1 thin liquids. He continued to have issues with poor p.o. intake and Marinol was added for appetite stimulation however patient started developing confusion with hallucination so this was discontinued. 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 History  obtained from review of EMR, discussion with primary team/primary RN, Speech Therapist and interview with patient/family. Rest of 10 point system reviewed and negative. Labs review from [facility] EMR Basic Metabolic Panel: BMP Latest Ref Rng & Units 02/09/2020 02/08/2020 02/05/2020  Glucose 70 - 99 mg/dL 98 76 131(H)  BUN 8 - 23 mg/dL 16 16 18   Creatinine 0.61 - 1.24 mg/dL 0.73 0.80 0.78  Sodium 135 - 145 mmol/L 144 144 141  Potassium 3.5 - 5.1 mmol/L 3.8 3.6 3.7  Chloride 98 - 111 mmol/L 105 103 102  CO2 22 - 32 mmol/L 28 27 29   Calcium 8.9 - 10.3 mg/dL 10.2 10.3 10.3   Liver function panel.  Hepatic Function Latest Ref Rng & Units 02/05/2020 02/04/2020 01/29/2020  Total Protein 6.5 - 8.1 g/dL - 7.5 7.1  Albumin 3.5 - 5.0 g/dL 2.2(L) 2.4(L) 2.3(L)  AST 15 - 41 U/L - 22 20  ALT 0 - 44 U/L - 27 30  Alk Phosphatase 38 - 126 U/L - 59 63  Total Bilirubin 0.3 - 1.2 mg/dL - 1.0 0.6    CBC: CBC Latest Ref Rng & Units 02/15/2020 02/15/2020 02/09/2020  WBC 4.0 - 10.5 K/uL 4.7 3.6(L) 5.5  Hemoglobin 13.0 - 17.0 g/dL 9.6(L) 8.5(L) 11.2(L)  Hematocrit 39.0 - 52.0 % 30.6(L) 26.8(L) 35.4(L)  Platelets 150 - 400 K/uL      No results for input(s): BNP, PROBNP in the last 168 hours. Review or lab tests,/diagnostics Records reviewed and summarized above.  PAST MEDICAL HISTORY:  Past Medical History:  Diagnosis Date  . Allergic rhinitis    NEC  . Asthma   . Glaucoma   . Lung cancer, main bronchus ([place]) 12/2019  . Tobacco use disorder 09/24/2019     SOCIAL HX:  Patient in SNF for acute rehab Social History   Tobacco Use  . Smoking status: Former Smoker    Packs/day: 0.50    Years: 60.00    Pack years: 30.00    Types: Cigarettes    Start date: 12/24/1955    Quit date: 09/24/2019    Years since quitting: 0.4  . Smokeless tobacco: Never  Used  Substance Use Topics  . Alcohol use: No    FAMILY HX:  Family History  Problem Relation Age of Onset  . Cancer Mother   . Heart attack Father   . Cancer Brother     ALLERGIES:  Allergies  Allergen Reactions  . Penicillins Anaphylaxis and Other (See Comments)    Pt does not recall this reaction 2021*Tolerated Cephalexin*  Has patient had a PCN reaction causing immediate rash, facial/tongue/throat swelling, SOB or lightheadedness with hypotension: Yes Has patient had a PCN reaction causing severe rash involving mucus membranes or skin necrosis: No Has patient had a PCN reaction that required hospitalization No Has patient had a PCN reaction occurring within the last 10 years: Yes If all of the above answers are "NO", then may proceed with Cephalosporin use  . Wellbutrin [Bupropion] Other (See Comments)    Patient disputes this in 2021 Seizures  . Morphine And Related Itching and Other (See Comments)    Patient disputes this in 2021      PERTINENT MEDICATIONS:  Outpatient Encounter Medications as of 02/24/2020  Medication Sig  . acetaminophen (TYLENOL) 325 MG tablet Place 1-2 tablets (325-650 mg total) into feeding tube every 4 (four) hours as needed for mild pain.  [name] albuterol (VENTOLIN HFA) 108 (90 Base) MCG/ACT inhaler Inhale 2 puffs into the lungs  every 6 (six) hours as needed for wheezing or shortness of breath.  [name] aspirin 81 MG chewable tablet Place 1 tablet (81 mg total) into feeding tube daily.  . bisacodyl (DULCOLAX) 10 MG suppository Place 1 suppository (10 mg total) rectally daily after supper.  . citalopram (CELEXA) 20 MG tablet Place 1 tablet (20 mg total) into feeding tube daily.  . cyclobenzaprine (FLEXERIL) 10 MG tablet Take 1 tablet (10 mg total) by mouth 3 (three) times daily as needed for muscle spasms.  . cyclobenzaprine (FLEXERIL) 10 MG tablet Place 1 tablet (10 mg total) into feeding tube 3 (three) times daily as needed for muscle spasms.  . diclofenac  Sodium (VOLTAREN) 1 % GEL Apply 1 application topically 4 (four) times daily as needed (pain).   [name] docusate (COLACE) 50 MG/5ML liquid Place 10 mLs (100 mg total) into feeding tube 3 (three) times daily.  . fludrocortisone (FLORINEF) 0.1 MG tablet Place 1 tablet (0.1 mg total) into feeding tube daily.  . heparin 5000 UNIT/ML injection Inject 1 mL (5,000 Units total) into the skin every 8 (eight) hours.  . hydrocerin (EUCERIN) CREA Apply 1 application topically 2 (two) times daily.  [name] HYDROcodone-acetaminophen (NORCO/VICODIN) 5-325 MG tablet Place 1 tablet into feeding tube every 6 (six) hours as needed for severe pain (when tramadol doesn't work).  [name] ipratropium (ATROVENT) 0.06 % nasal spray Place 2 sprays into both nostrils 3 (three) times daily.  [name] lidocaine (LIDODERM) 5 % Place 3 patches onto the skin daily. Remove & Discard patch within 12 hours or as directed by MD  . lidocaine (XYLOCAINE) 2 % jelly 1 application by Other route daily.  . melatonin 3 MG TABS tablet Take 1 tablet (3 mg total) by mouth at bedtime.  . melatonin 3 MG TABS tablet Place 1 tablet (3 mg total) into feeding tube at bedtime.  . midodrine (PROAMATINE) 5 MG tablet Place 1 tablet (5 mg total) into feeding tube 3 (three) times daily with meals.  . Multiple Vitamin (MULTIVITAMIN WITH MINERALS) TABS tablet Take 1 tablet by mouth daily.  . Multiple Vitamin (MULTIVITAMIN WITH MINERALS) TABS tablet Place 1 tablet into feeding tube daily.  . Nutritional Supplements (FEEDING SUPPLEMENT, OSMOLITE 1.5 CAL,) LIQD Place 1,000 mLs into feeding tube continuous.  . Nutritional Supplements (FEEDING SUPPLEMENT, PROSOURCE TF,) liquid Place 45 mLs into feeding tube 2 (two) times daily.  . pantoprazole sodium (PROTONIX) 40 mg/20 mL PACK Place 20 mLs (40 mg total) into feeding tube daily.  . prochlorperazine (COMPAZINE) 10 MG/2ML injection Inject 1-2 mLs (5-10 mg total) into the muscle every 6 (six) hours as needed.  . prochlorperazine  (COMPAZINE) 25 MG suppository Place 0.5 suppositories (12.5 mg total) rectally every 6 (six) hours as needed for nausea.  . psyllium (HYDROCIL/METAMUCIL) 95 % PACK Take 1 packet by mouth daily.  . QUEtiapine (SEROQUEL) 25 MG tablet Place 1 tablet (25 mg total) into feeding tube at bedtime as needed (agitation/insomnia).  . senna (SENOKOT) 8.6 MG TABS tablet Place 2 tablets (17.2 mg total) into feeding tube daily.  . SYMBICORT 160-4.5 MCG/ACT inhaler Inhale 1 puff into the lungs 2 (two) times daily as needed (shortness of breath).   . timolol (TIMOPTIC) 0.5 % ophthalmic solution Place 1 drop into both eyes 2 (two) times daily.   . traMADol (ULTRAM) 50 MG tablet Place 1 tablet (50 mg total) into feeding tube 4 (four) times daily as needed for moderate pain.  . Travoprost, BAK Free, (TRAVATAN) 0.004 % SOLN ophthalmic  solution Place 1 drop into both eyes at bedtime.   . traZODone (DESYREL) 100 MG tablet Place 1 tablet (100 mg total) into feeding tube at bedtime.  . Water For Irrigation, Sterile (FREE WATER) SOLN Place 200 mLs into feeding tube 4 (four) times daily - after meals and at bedtime.   No facility-administered encounter medications on file as of 02/24/2020.    ROS General: NAD Constitution: Denies fever/chills EYES: denies vision changes ENMT:endorsed painful dysphagia Cardiovascular: denies chest pain Pulmonary: denies  cough, denies dyspnea  Abdomen: endorses fair appetite, denies constipation or diarrhea GU: denies dysuria, endorses presence of Foley Cath MSK:  ambulatory, no falls reported; no joint pain Skin: denies rashes/bruising Neurological: endorses weakness, denies pain, denies insomnia Psych: Endorses positive mood Heme/lymph/immuno: denies bruises, no abnormal bleeding   PHYSICAL EXAM  Height: 5 feet 8 inches  Weight: 149 Ibs BMI Constitutional: In no acute distress, well developed and well nourished Cardiovascular: regular rate and rhythm Pulmonary: no cough, no  increased work of breathing, normal respiratory effort Abdomen: soft, non tender, positive bowel sounds in all quadrants; patient peg tube in place, Osmolite 1.5 80m/hr. GU:  no suprapubic tenderness Eyes: Normal lids, no discharge, sclera anicteric ENMT: Moist mucous membranes Musculoskeletal:  no edema in BLE Skin: no rash to visible skin, dry skin,warm without cyanosis Psych: non-anxious affect Neurological: Weakness but otherwise non focal Heme/lymph/immuno: no bruises, no bleeding  Palliative Care was asked to follow this patient by consultation request of JMartiniqueBlattenberger, NP/Dr. SGarwin Brothersto help address advance care planning and complex decision making. Thank you for the opportunity to participate in the care of HDagoberto ReefPlease call our office at [phone] we can be of additional assistance.  Note: Portions of this note were generated with DLobbyist Dictation errors may occur despite best attempts at proofreading.  LTeodoro Spray NP

## 2020-02-25 ENCOUNTER — Ambulatory Visit: Payer: Medicare Other

## 2020-02-26 ENCOUNTER — Ambulatory Visit: Payer: Medicare Other

## 2020-02-29 ENCOUNTER — Ambulatory Visit: Payer: Medicare Other

## 2020-03-01 ENCOUNTER — Ambulatory Visit: Payer: Medicare Other

## 2020-03-01 ENCOUNTER — Ambulatory Visit (HOSPITAL_COMMUNITY): Payer: Medicare Other

## 2020-03-02 ENCOUNTER — Ambulatory Visit: Payer: Medicare Other

## 2020-03-03 ENCOUNTER — Ambulatory Visit: Payer: Medicare Other

## 2020-03-04 ENCOUNTER — Ambulatory Visit: Payer: Medicare Other

## 2020-03-04 ENCOUNTER — Encounter: Payer: Self-pay | Admitting: *Deleted

## 2020-03-04 NOTE — Progress Notes (Signed)
I called [facility] in [facility] to check on referral. I was told they did not receive referral. I clarified fax number and re-faxed referral.

## 2020-03-08 ENCOUNTER — Ambulatory Visit (HOSPITAL_COMMUNITY): Admission: RE | Admit: 2020-03-08 | Payer: Medicare Other | Source: Ambulatory Visit

## 2020-03-10 ENCOUNTER — Encounter: Payer: Self-pay | Admitting: Internal Medicine

## 2020-03-14 ENCOUNTER — Ambulatory Visit
Admit: 2020-03-14 | Discharge: 2020-03-14 | Disposition: A | Discharge status: Home / Self Care | Payer: Medicare Other | Attending: Radiation Oncology | Admitting: Radiation Oncology

## 2020-03-14 NOTE — Progress Notes (Incomplete)
Radiation Oncology         [phone] ________________________________  Name: Gregory Bautista MRN: [id number]  Date: 03/14/2020  DOB: 19-Jun-1940  Follow-Up Visit Note  CC: [name], MD  [name], *  No diagnosis found.  Diagnosis: Clinical stage IIIA (T4, N0) left upper lobe adenocarcinoma  Interval Since Last Radiation: One month and one day  A total of 35 Gy given in 2.5 Gy per fraction for a total of 14 fractions (Photon; 3D) directed at the left lung from 01/25/2020 to 02/11/2020  Narrative:  The patient returns today for routine follow-up. Since the end of treatment, he was finally discharged from the hospital and into a skilled nursing facility on 02/15/2020 after over a month admission. Most recent MRI of abdomen on 02/05/2020 did not show any evidence of metastatic disease in the abdomen.  The patient was last seen by Dr. Julien Nordmann on 02/24/2020, at which time they discussed palliative care and hospice versus palliative systemic chemotherapy with Carboplatin, Alimta, and Keytruda. The patient was interested in treatment. However, he plans on moving to [place], [place] after being discharged from the [facility] so that he can be closer to family.  On review of systems, he reports ***. He denies ***.                             ALLERGIES:  is allergic to penicillins, wellbutrin [bupropion], and morphine and related.  Meds: Current Outpatient Medications  Medication Sig Dispense Refill  . acetaminophen (TYLENOL) 325 MG tablet Place 1-2 tablets (325-650 mg total) into feeding tube every 4 (four) hours as needed for mild pain.    [name] albuterol (VENTOLIN HFA) 108 (90 Base) MCG/ACT inhaler Inhale 2 puffs into the lungs every 6 (six) hours as needed for wheezing or shortness of breath.    [name] aspirin 81 MG chewable tablet Place 1 tablet (81 mg total) into feeding tube daily.    . bisacodyl (DULCOLAX) 10 MG suppository Place 1 suppository (10 mg total)  rectally daily after supper. 12 suppository 0  . citalopram (CELEXA) 20 MG tablet Place 1 tablet (20 mg total) into feeding tube daily.    . cyclobenzaprine (FLEXERIL) 10 MG tablet Take 1 tablet (10 mg total) by mouth 3 (three) times daily as needed for muscle spasms. 30 tablet 0  . cyclobenzaprine (FLEXERIL) 10 MG tablet Place 1 tablet (10 mg total) into feeding tube 3 (three) times daily as needed for muscle spasms. 30 tablet 0  . diclofenac Sodium (VOLTAREN) 1 % GEL Apply 1 application topically 4 (four) times daily as needed (pain).     [name] docusate (COLACE) 50 MG/5ML liquid Place 10 mLs (100 mg total) into feeding tube 3 (three) times daily. 100 mL 0  . fludrocortisone (FLORINEF) 0.1 MG tablet Place 1 tablet (0.1 mg total) into feeding tube daily.    . heparin 5000 UNIT/ML injection Inject 1 mL (5,000 Units total) into the skin every 8 (eight) hours. 1 mL   . hydrocerin (EUCERIN) CREA Apply 1 application topically 2 (two) times daily.  0  . HYDROcodone-acetaminophen (NORCO/VICODIN) 5-325 MG tablet Place 1 tablet into feeding tube every 6 (six) hours as needed for severe pain (when tramadol doesn't work). 6 tablet 0  . ipratropium (ATROVENT) 0.06 % nasal spray Place 2 sprays into both nostrils 3 (three) times daily. 15 mL 12  . lidocaine (LIDODERM) 5 % Place 3 patches onto the  skin daily. Remove & Discard patch within 12 hours or as directed by MD 30 patch 0  . lidocaine (XYLOCAINE) 2 % jelly 1 application by Other route daily.    . melatonin 3 MG TABS tablet Take 1 tablet (3 mg total) by mouth at bedtime.  0  . melatonin 3 MG TABS tablet Place 1 tablet (3 mg total) into feeding tube at bedtime.  0  . midodrine (PROAMATINE) 5 MG tablet Place 1 tablet (5 mg total) into feeding tube 3 (three) times daily with meals.    . Multiple Vitamin (MULTIVITAMIN WITH MINERALS) TABS tablet Take 1 tablet by mouth daily.    . Multiple Vitamin (MULTIVITAMIN WITH MINERALS) TABS tablet Place 1 tablet into feeding  tube daily.    . Nutritional Supplements (FEEDING SUPPLEMENT, OSMOLITE 1.5 CAL,) LIQD Place 1,000 mLs into feeding tube continuous.  0  . Nutritional Supplements (FEEDING SUPPLEMENT, PROSOURCE TF,) liquid Place 45 mLs into feeding tube 2 (two) times daily.    . pantoprazole sodium (PROTONIX) 40 mg/20 mL PACK Place 20 mLs (40 mg total) into feeding tube daily. 30 mL   . prochlorperazine (COMPAZINE) 10 MG/2ML injection Inject 1-2 mLs (5-10 mg total) into the muscle every 6 (six) hours as needed. 240 mL   . prochlorperazine (COMPAZINE) 25 MG suppository Place 0.5 suppositories (12.5 mg total) rectally every 6 (six) hours as needed for nausea. 12 suppository 0  . psyllium (HYDROCIL/METAMUCIL) 95 % PACK Take 1 packet by mouth daily. 240 each   . QUEtiapine (SEROQUEL) 25 MG tablet Place 1 tablet (25 mg total) into feeding tube at bedtime as needed (agitation/insomnia). 5 tablet 0  . senna (SENOKOT) 8.6 MG TABS tablet Place 2 tablets (17.2 mg total) into feeding tube daily. 120 tablet 0  . sildenafil (VIAGRA) 100 MG tablet     . SYMBICORT 160-4.5 MCG/ACT inhaler Inhale 1 puff into the lungs 2 (two) times daily as needed (shortness of breath).     . timolol (TIMOPTIC) 0.5 % ophthalmic solution Place 1 drop into both eyes 2 (two) times daily.     . traMADol (ULTRAM) 50 MG tablet Place 1 tablet (50 mg total) into feeding tube 4 (four) times daily as needed for moderate pain. 10 tablet 0  . Travoprost, BAK Free, (TRAVATAN) 0.004 % SOLN ophthalmic solution Place 1 drop into both eyes at bedtime.     . traZODone (DESYREL) 100 MG tablet Place 1 tablet (100 mg total) into feeding tube at bedtime.    . Water For Irrigation, Sterile (FREE WATER) SOLN Place 200 mLs into feeding tube 4 (four) times daily - after meals and at bedtime.     No current facility-administered medications for this encounter.    Physical Findings: The patient is in no acute distress. Patient is alert and oriented.  vitals were not taken  for this visit. No significant changes. Lungs are clear to auscultation bilaterally. Heart has regular rate and rhythm. No palpable cervical, supraclavicular, or axillary adenopathy. Abdomen soft, non-tender, normal bowel sounds. ***  Lab Findings: Lab Results  Component Value Date   WBC 5.1 02/24/2020   HGB 10.2 (L) 02/24/2020   HCT 32.4 (L) 02/24/2020   MCV 88.0 02/24/2020   PLT 335 02/24/2020    Radiographic Findings: No results found.  Impression: Clinical stage IIIA (T4, N0) left upper lobe adenocarcinoma  The patient is recovering from the effects of radiation.  ***  Plan:  ***  Total time spent in this encounter was ***  minutes which included reviewing the patient's most recent follow-ups, hospital admission, MRI of abdomen, physical examination, and documentation. ____________________________________   Blair Promise, PhD, MD  This document serves as a record of services personally performed by Gery Pray, MD. It was created on his behalf by Clerance Lav, a trained medical scribe. The creation of this record is based on the scribe's personal observations and the provider's statements to them. This document has been checked and approved by the attending provider.

## 2020-03-15 ENCOUNTER — Telehealth: Payer: Self-pay | Admitting: *Deleted

## 2020-03-15 NOTE — Telephone Encounter (Signed)
CALLED PATIENT'S DAUGHTER- TONI DANTZLER AND ASKED ABOUT RESCHEDULING MISSED FU ON 03-14-20, PATIENT'S DAUGHTER STATED THAT HER DAD HAS BEEN IN THE HOSPITAL AND IS NOW OUT AND STAYING WITH A RELATIVE IN [place], SHE DOESN'T KNOW IF HE WILL RETURN TO [place], NOTIFIED KIM SIMITH, DR. [facility] NURSE

## 2020-03-16 ENCOUNTER — Encounter: Payer: Medicare Other | Admitting: Physical Medicine and Rehabilitation

## 2020-03-28 ENCOUNTER — Telehealth: Payer: Self-pay | Admitting: Internal Medicine

## 2020-03-28 NOTE — Telephone Encounter (Signed)
Release: [id number] Faxed medical records to [facility] @ fax# [phone]

## 2020-04-20 ENCOUNTER — Telehealth: Payer: Self-pay | Admitting: Internal Medicine

## 2020-04-20 NOTE — Telephone Encounter (Signed)
Release: [id number]  Released records to [facility] to [phone]

## 2020-04-25 ENCOUNTER — Telehealth: Payer: Self-pay | Admitting: Internal Medicine

## 2020-04-25 NOTE — Telephone Encounter (Signed)
Release: [id number] Faxed medical records to [facility] @ fax# [phone]

## 2020-07-22 DEATH — deceased
